# Patient Record
Sex: Female | Born: 1940 | Race: White | Hispanic: No | Smoking: Former smoker
Health system: Southern US, Community
[De-identification: ages and names within clinical notes are randomized; demographics above are authoritative.]

## PROBLEM LIST (undated history)

## (undated) DIAGNOSIS — I639 Cerebral infarction, unspecified: Secondary | ICD-10-CM

## (undated) DIAGNOSIS — J45909 Unspecified asthma, uncomplicated: Secondary | ICD-10-CM

## (undated) DIAGNOSIS — F32A Depression, unspecified: Secondary | ICD-10-CM

## (undated) DIAGNOSIS — E1142 Type 2 diabetes mellitus with diabetic polyneuropathy: Secondary | ICD-10-CM

## (undated) DIAGNOSIS — K219 Gastro-esophageal reflux disease without esophagitis: Secondary | ICD-10-CM

## (undated) DIAGNOSIS — Z972 Presence of dental prosthetic device (complete) (partial): Secondary | ICD-10-CM

## (undated) DIAGNOSIS — I10 Essential (primary) hypertension: Secondary | ICD-10-CM

## (undated) DIAGNOSIS — M1990 Unspecified osteoarthritis, unspecified site: Secondary | ICD-10-CM

## (undated) DIAGNOSIS — Z974 Presence of external hearing-aid: Secondary | ICD-10-CM

## (undated) DIAGNOSIS — E119 Type 2 diabetes mellitus without complications: Secondary | ICD-10-CM

## (undated) DIAGNOSIS — N184 Chronic kidney disease, stage 4 (severe): Secondary | ICD-10-CM

## (undated) DIAGNOSIS — D649 Anemia, unspecified: Secondary | ICD-10-CM

## (undated) HISTORY — PX: BREAST LUMPECTOMY: SHX2

## (undated) HISTORY — PX: COLONOSCOPY: SHX174

---

## 2017-11-27 ENCOUNTER — Encounter: Payer: Self-pay | Admitting: Neurology

## 2017-11-29 ENCOUNTER — Telehealth: Payer: Self-pay | Admitting: Neurology

## 2017-11-29 NOTE — Telephone Encounter (Signed)
Request for records faxed to [place] (Dr. Princella Ion) [phone] & to [place]  Request mailed to [facility] in [place] 11/29/17  LM

## 2017-12-05 ENCOUNTER — Ambulatory Visit: Payer: Medicare PPO | Attending: Physician Assistant

## 2017-12-05 DIAGNOSIS — R4789 Other speech disturbances: Secondary | ICD-10-CM | POA: Insufficient documentation

## 2017-12-05 DIAGNOSIS — R479 Unspecified speech disturbances: Secondary | ICD-10-CM

## 2017-12-05 NOTE — Therapy (Signed)
[facility] [address] [place], [place], [address] Phone: [phone]   Fax:  [phone]  Patient Details  Name: Phylis Javed MRN: [id number] Date of Birth: 1940/07/19 Referring Provider:  [name], PA-C  Encounter Date: 12/05/2017  ST ARRIVED-CANCEL  Pt/daughter arrived and SLP learned pt's MD had put in order for [facility] therapies.   Pt will pursue HH therapies first and then if necessary return at a later date for ST eval.  Pt will be rescheduled for 01-09-18 due to tight ST schedules until that date.  [facility], [place]  12/05/2017, 1:51 PM  [facility] [address] [place], [place], [address] Phone: [phone]   Fax:  [phone]

## 2017-12-06 ENCOUNTER — Encounter: Payer: Self-pay | Admitting: Neurology

## 2017-12-06 ENCOUNTER — Ambulatory Visit (INDEPENDENT_AMBULATORY_CARE_PROVIDER_SITE_OTHER): Payer: Medicare PPO | Admitting: Neurology

## 2017-12-06 VITALS — BP 104/64 | HR 78 | Ht 64.0 in | Wt 135.2 lb

## 2017-12-06 DIAGNOSIS — I639 Cerebral infarction, unspecified: Secondary | ICD-10-CM | POA: Diagnosis not present

## 2017-12-06 DIAGNOSIS — Z8673 Personal history of transient ischemic attack (TIA), and cerebral infarction without residual deficits: Secondary | ICD-10-CM | POA: Insufficient documentation

## 2017-12-06 DIAGNOSIS — E1142 Type 2 diabetes mellitus with diabetic polyneuropathy: Secondary | ICD-10-CM

## 2017-12-06 NOTE — Patient Instructions (Signed)
Continue your medications  Continue physical therapy   We will ask for your records again so I can review  Return to clinic in 3 months

## 2017-12-06 NOTE — Progress Notes (Signed)
[facility] Clinic Note - Initial Visit   Date: 12/06/17  Teresa Daniels MRN: [id number] DOB: 12/29/1940   Dear [name], PA-C:  Thank you for your kind referral of Teresa Daniels for consultation of stroke. Although her history is well known to you, please allow [place] to reiterate it for the purpose of our medical record. The patient was accompanied to the clinic by daughter who also provides collateral information.     History of Present Illness: Teresa Daniels is a 77 y.o. 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  Patient was living in [place] and recently moved to [place] to be closer to family as she recovers from her stroke.  Patient was in her usual state of health until easter weekend, when she suffered two falls on Saturday and had difficulty standing up due to right leg weakness.  She later told her daughter that it took an hour to stand up.  The following day, she cancelled plans for [employer] and going to church because she did not feel well and had another fall.  She was having difficulty texting her daughter, attributed to right arm "heaviness" and said they her right leg was not moving.  Immediately, her daughter instructed her to call EMS who transported her to her local hospital.  I do not have any of the records of her hospital stay.  From what I can gather, she presented outside of the window for tPA. Stroke etiology was suspected due to large vessel stenosis (location?) and she was started on plavix, as she was already taking aspirin.  Currently, she takes aspirin 81mg , plavix 75mg , and atorvastatin 40mg .  She was discharged to rehab facility where she was in a wheelchair and gradually made some improvement of her right arm and leg strength.  She moved to [facility] very quickly after being discharged from rehab and  has plans to return home, when able.  Over the past few weeks, she has regained all function of the right arm and has some weakness of the right leg, but it is much stronger than before.  She is walking with a walker now.  Texting is easier than before.  She does not have difficulty swallowing or numbness/tingling on one side.  She has long history of diabetic neuropathy of the feet and takes gabapentin 300mg  at bedtime.   Past Medical History Diabetes mellitus Hypertension Hyperlipidemia Stroke   Past Surgical History C-section in 1973   Medications:  Outpatient Encounter Medications as of 12/06/2017  Medication Sig  . Ascorbic Acid (VITAMIN C PO) Take by mouth.  Marland Kitchen aspirin 81 MG chewable tablet Chew by mouth.  Marland Kitchen atorvastatin (LIPITOR) 40 MG tablet Take by mouth.  . beclomethasone (QVAR) 40 MCG/ACT inhaler Inhale into the lungs.  . cholecalciferol (VITAMIN D) 1000 units tablet Take by mouth.  . clopidogrel (PLAVIX) 75 MG tablet Take by mouth.  . docusate sodium (COLACE) 100 MG capsule Take by mouth.  . ferrous sulfate 325 (65 FE) MG tablet Take by mouth.  . gabapentin (NEURONTIN) 300 MG capsule Take one capsule at bedtime.  . hydrochlorothiazide (HYDRODIURIL) 25 MG tablet Take by mouth.  . levocetirizine (XYZAL) 5 MG tablet Take by mouth.  . losartan (COZAAR) 50 MG tablet Take by mouth.  Marland Kitchen MAGNESIUM PO Take by mouth.  . metFORMIN (GLUCOPHAGE) 1000 MG tablet Take by mouth.  . montelukast (SINGULAIR) 10 MG tablet Take by  mouth.  . Omega-3 1000 MG CAPS Take by mouth.  . pantoprazole (PROTONIX) 40 MG tablet Take by mouth.  . traZODone (DESYREL) 50 MG tablet Take by mouth.   No facility-administered encounter medications on file as of 12/06/2017.      Allergies: Allergies not on file  Family History: Mother had heart disease and breast cancer. Father had TB complications.  Sister has heart disease, living.   Social History: Social History   Tobacco Use  . Smoking status:  Former Smoker    Last attempt to quit: 1969    Years since quitting: 50.4  . Smokeless tobacco: Never Used  Substance Use Topics  . Alcohol use: Yes  . Drug use: Never   Social History   Social History Narrative   Lives alone in [place].  Currently staying in [place] with her daughter until she is able to go home.  Has 2 children.  Retired Web designer.  Education: college.     Review of Systems:  CONSTITUTIONAL: No fevers, chills, night sweats, or weight loss.   EYES: No visual changes or eye pain ENT: No hearing changes.  No history of nose bleeds.   RESPIRATORY: No cough, wheezing and shortness of breath.   CARDIOVASCULAR: Negative for chest pain, and palpitations.   GI: Negative for abdominal discomfort, blood in stools or black stools.  No recent change in bowel habits.   GU:  No history of incontinence.   MUSCLOSKELETAL: No history of joint pain or swelling.  No myalgias.   SKIN: Negative for lesions, rash, and itching.   HEMATOLOGY/ONCOLOGY: Negative for prolonged bleeding, bruising easily, and swollen nodes.  No history of cancer.   ENDOCRINE: Negative for cold or heat intolerance, polydipsia or goiter.   PSYCH:  No depression or anxiety symptoms.   NEURO: As Above.   Vital Signs:  BP 104/64   Pulse 78   Wt 135 lb 4 oz (61.3 kg)   SpO2 93%    General Medical Exam:   General:  Well appearing, comfortable.   Eyes/ENT: see cranial nerve examination.   Neck: No masses appreciated.  Full range of motion without tenderness.  No carotid bruits. Respiratory:  Clear to auscultation, good air entry bilaterally.   Cardiac:  Regular rate and rhythm, no murmur.   Extremities:  No deformities, edema, or skin discoloration.  Skin:  No rashes or lesions.  Neurological Exam: MENTAL STATUS including orientation to time, place, person, recent and remote memory, attention span and concentration, language, and fund of knowledge is normal.  Speech is not dysarthric, she has  hoarseness.  CRANIAL NERVES: II:  No visual field defects.  Unremarkable fundi.   III-IV-VI: Pupils equal round and reactive to light.  Normal conjugate, extra-ocular eye movements in all directions of gaze.  No nystagmus.  No ptosis.   V:  Normal facial sensation.   VII:  Normal facial symmetry and movements.   VIII:  Normal hearing and vestibular function.   IX-X:  Normal palatal movement.   XI:  Normal shoulder shrug and head rotation.   XII:  Normal tongue strength and range of motion, no deviation or fasciculation.  MOTOR:  No atrophy, fasciculations or abnormal movements.  No pronator drift.  Tone is normal.    Right Upper Extremity:    Left Upper Extremity:    Deltoid  5/5   Deltoid  5/5   Biceps  5/5   Biceps  5/5   Triceps  5/5   Triceps  5/5  Wrist extensors  5/5   Wrist extensors  5/5   Wrist flexors  5/5   Wrist flexors  5/5   Finger extensors  5/5   Finger extensors  5/5   Finger flexors  5/5   Finger flexors  5/5   Dorsal interossei  5/5   Dorsal interossei  5/5   Abductor pollicis  5/5   Abductor pollicis  5/5   Tone ([place] scale)  0  Tone ([place] scale)  0   Right Lower Extremity:    Left Lower Extremity:    Hip flexors  4/5   Hip flexors  5/5   Hip extensors  5/5   Hip extensors  5/5   Knee flexors  5/5   Knee flexors  5/5   Knee extensors  5/5   Knee extensors  5/5   Dorsiflexors  5/5   Dorsiflexors  5/5   Plantarflexors  5/5   Plantarflexors  5/5   Toe extensors  4/5   Toe extensors  4/5   Toe flexors  4/5   Toe flexors  4/5   Tone ([place] scale)  0  Tone ([place] scale)  0   MSRs:  Right                                                                 Left brachioradialis 2+  brachioradialis 2+  biceps 2+  biceps 2+  triceps 2+  triceps 2+  patellar 1+  Patellar 1+  ankle jerk 0  ankle jerk 0  Hoffman no  Hoffman no  plantar response down  plantar response down   SENSORY: trace vibration at the knees bilaterally; gradient pattern of  temperature and pin pick distal to lower legs.  Sensation is intact in the arms.   COORDINATION/GAIT: Normal finger-to- nose-finger.  Intact rapid alternating movements bilaterally. Gait is mildly wide-based, assisted with walker or one-person.   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  Given her dense symptoms, suspect pure motor basal ganglia stroke vs LMCA motor territory.  Clinically, she has made marked improvement with now normal function in the RUE, but continues to have proximal RLE weakness.  Her hospital records have been requested again so I can review to understand etiology of her stroke. From what I understand, there was concern of large vessel stenosis and therefore started dual antiplatelet therapy and statin therapy.  At this time, recommend that she continue aspirin 81mg , plavix 75mg , and lipitor 40mg  daily.  She will continue physical therapy and I encouraged her to be compliant with her home exercises.  If there is any concern from her hospital records for cardioembolic source, I will order cardionet monitor.   Diabetic neuropathy with distal sensorimotor deficits.  She takes gabapentin 300mg  at bedtime.  Continue to use walker.  Fall precautions dicussed.  I would like to see her stronger prior to deciding that she is safe to return home.  She would like to resume driving which would be better assess with formal driving evaluation, which we can discuss at her next visit based on her progress.    Return to clinic in 3 months   Thank you for allowing me to participate in patient's care.  If I can answer any additional questions, I would  be pleased to do so.    Sincerely,    Fusae Florio K. Posey Pronto, DO

## 2017-12-07 ENCOUNTER — Telehealth: Payer: Self-pay | Admitting: Neurology

## 2017-12-07 NOTE — Telephone Encounter (Signed)
Rec'd medical records form [facility] - Sending interoffice mail to Dr. Posey Pronto At [facility] 12/07/17 LM

## 2017-12-07 NOTE — Telephone Encounter (Signed)
Rec'd records from [facility] have been sent to  Dr Posey Pronto at [facility] via interoffice mail 12/07/17  LM

## 2017-12-13 NOTE — Telephone Encounter (Signed)
I spoke with patient's daughter and she said that she is her mom's transportation and is currently in [place].  She will be back around June 19th so would like it scheduled that week if possible.

## 2017-12-13 NOTE — Telephone Encounter (Signed)
I received over 550 pages of outside hospital records, and spent 40 minutes reviewing records.  In summary, on 4/21, she presented to the ER with 2 day history of right sided weakness and falls. Exam showed weakness of the right face, arm, and leg. MRI showed left subcortical infarct. There was no evidence of large vessel occlusion. Surface echocardiogram did not show a cardioembolic source. She was previously taking aspirin and Zocor at home.  With her new stroke, she was started on dual antiplatelet therapy with aspirin 81 and Plavix 75 mg daily.  His she was discharged to [facility] where she was admitted from 4/30 through 11/22/2017.  Labs 06/11/2017: TSH 1.4, hemoglobin A1c 6.3, LDL 78, cholesterol 151 triglycerides 142 HDL 47, complete metabolic panel all within normal limits.  Diagnostic studies April 2019 TTE: EF is mildly reduced at 94-80%, grade 1 diastolic dysfunction. Right ventricle appears normal. Left atrium is mildly enlarged, right atrium is normal. Trivial mitral regurgitation and mild tricuspid regurgitation. CT angiogram head and neck: Mild intracranial atherosclerotic disease. Mild calcification and right carotid bulb, less than 50% severity. MRI brain: Acute nonhemorrhagic cerebrovascular accident along the left putamen and left corona radiate.  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 To be complete, I recommend 30 day cardiac monitoring assess for underlying cardiac arrhythmia.  Donika K. Posey Pronto, DO

## 2017-12-14 ENCOUNTER — Other Ambulatory Visit: Payer: Self-pay | Admitting: *Deleted

## 2017-12-14 DIAGNOSIS — I639 Cerebral infarction, unspecified: Secondary | ICD-10-CM

## 2017-12-14 DIAGNOSIS — R42 Dizziness and giddiness: Secondary | ICD-10-CM

## 2017-12-14 NOTE — Telephone Encounter (Signed)
I will call cardiology to set this up and let patient's daughter know appointment time and date.

## 2018-01-01 ENCOUNTER — Ambulatory Visit (INDEPENDENT_AMBULATORY_CARE_PROVIDER_SITE_OTHER): Payer: Medicare PPO

## 2018-01-01 ENCOUNTER — Other Ambulatory Visit: Payer: Self-pay | Admitting: Neurology

## 2018-01-01 DIAGNOSIS — I4891 Unspecified atrial fibrillation: Secondary | ICD-10-CM | POA: Diagnosis not present

## 2018-01-01 DIAGNOSIS — R42 Dizziness and giddiness: Secondary | ICD-10-CM

## 2018-01-01 DIAGNOSIS — I639 Cerebral infarction, unspecified: Secondary | ICD-10-CM | POA: Diagnosis not present

## 2018-01-09 ENCOUNTER — Ambulatory Visit: Payer: Medicare PPO

## 2018-02-11 ENCOUNTER — Ambulatory Visit: Payer: Self-pay | Admitting: Neurology

## 2018-02-14 ENCOUNTER — Telehealth: Payer: Self-pay | Admitting: *Deleted

## 2018-02-14 NOTE — Telephone Encounter (Signed)
-----   Message from [name], DO sent at 02/14/2018  2:10 PM EDT ----- Please inform patient that her heart monitor looks great, no abnormal rhythms.  Thanks.

## 2018-02-14 NOTE — Telephone Encounter (Signed)
Patient's daughter given the results.

## 2018-03-08 ENCOUNTER — Emergency Department (HOSPITAL_COMMUNITY)
Admission: EM | Admit: 2018-03-08 | Discharge: 2018-03-08 | Disposition: A | Discharge status: Home / Self Care | Payer: Medicare PPO | Attending: Emergency Medicine | Admitting: Emergency Medicine

## 2018-03-08 ENCOUNTER — Emergency Department (HOSPITAL_COMMUNITY): Payer: Medicare PPO

## 2018-03-08 ENCOUNTER — Other Ambulatory Visit: Payer: Self-pay

## 2018-03-08 ENCOUNTER — Encounter (HOSPITAL_COMMUNITY): Payer: Self-pay | Admitting: Emergency Medicine

## 2018-03-08 DIAGNOSIS — W108XXA Fall (on) (from) other stairs and steps, initial encounter: Secondary | ICD-10-CM | POA: Diagnosis not present

## 2018-03-08 DIAGNOSIS — S0990XA Unspecified injury of head, initial encounter: Secondary | ICD-10-CM | POA: Diagnosis not present

## 2018-03-08 DIAGNOSIS — M79671 Pain in right foot: Secondary | ICD-10-CM

## 2018-03-08 DIAGNOSIS — Z87891 Personal history of nicotine dependence: Secondary | ICD-10-CM | POA: Insufficient documentation

## 2018-03-08 DIAGNOSIS — E119 Type 2 diabetes mellitus without complications: Secondary | ICD-10-CM | POA: Diagnosis not present

## 2018-03-08 DIAGNOSIS — Y929 Unspecified place or not applicable: Secondary | ICD-10-CM | POA: Diagnosis not present

## 2018-03-08 DIAGNOSIS — Y9389 Activity, other specified: Secondary | ICD-10-CM | POA: Insufficient documentation

## 2018-03-08 DIAGNOSIS — E1142 Type 2 diabetes mellitus with diabetic polyneuropathy: Secondary | ICD-10-CM | POA: Insufficient documentation

## 2018-03-08 DIAGNOSIS — W19XXXA Unspecified fall, initial encounter: Secondary | ICD-10-CM

## 2018-03-08 DIAGNOSIS — Y999 Unspecified external cause status: Secondary | ICD-10-CM | POA: Diagnosis not present

## 2018-03-08 DIAGNOSIS — Z8673 Personal history of transient ischemic attack (TIA), and cerebral infarction without residual deficits: Secondary | ICD-10-CM | POA: Insufficient documentation

## 2018-03-08 DIAGNOSIS — I10 Essential (primary) hypertension: Secondary | ICD-10-CM | POA: Insufficient documentation

## 2018-03-08 DIAGNOSIS — S0101XA Laceration without foreign body of scalp, initial encounter: Secondary | ICD-10-CM | POA: Diagnosis present

## 2018-03-08 HISTORY — DX: Essential (primary) hypertension: I10

## 2018-03-08 HISTORY — DX: Unspecified asthma, uncomplicated: J45.909

## 2018-03-08 HISTORY — DX: Type 2 diabetes mellitus without complications: E11.9

## 2018-03-08 HISTORY — DX: Cerebral infarction, unspecified: I63.9

## 2018-03-08 MED ORDER — LIDOCAINE-EPINEPHRINE (PF) 2 %-1:200000 IJ SOLN
10.0000 mL | Freq: Once | INTRAMUSCULAR | Status: AC
  Administered 2018-03-08: 10 mL
  Filled 2018-03-08: qty 20

## 2018-03-08 NOTE — ED Provider Notes (Signed)
Patient placed in Quick Look pathway, seen and evaluated   Chief Complaint: fall  HPI:   Teresa Daniels is a 77 y.o. female who presents to the via private care s/p fall. was going up the stairs and made it up to 2 stairs and she fell backwards. Pt unsure why she fell, she denies dizziness. Pt did not lose consciousness. Pt did hit the back of her head on the concrete floor. Pt reports she is currently on blood thinners. Pt also reports pain to buttocks. Pt alert and oriented, in no acute distress at this time.   ROS: Neuro: headache  Skin: laceration  M/S: low back and right ankle pain  Physical Exam:  BP (!) 105/54 (BP Location: Left Arm)   Pulse 88   Temp 98.3 F (36.8 C) (Oral)   Resp 16   Ht 5\' 5"  (1.651 m)   Wt 59 kg   SpO2 98%   BMI 21.63 kg/m    Gen: No distress  Neuro: Awake and Alert  Skin: laceration scalp  M/S: right lateral ankle swollen, lower back and neck pain     Initiation of care has begun. The patient has been counseled on the process, plan, and necessity for staying for the completion/evaluation, and the remainder of the medical screening examination    Ashley Murrain, NP 03/08/18 Sylvan Cheese, MD 03/09/18 [phone]

## 2018-03-08 NOTE — Discharge Instructions (Signed)
You were seen in the Emergency Department (ED) today for a head injury.  Based on your evaluation, you may have sustained a concussion (or bruise) to your brain.  If you had a CT scan done, it did not show any evidence of serious injury or bleeding.    Symptoms to expect from a concussion include nausea, mild to moderate headache, difficulty concentrating or sleeping, and mild lightheadedness.  These symptoms should improve over the next few days to weeks, but it may take many weeks before you feel back to normal.  Return to the emergency department or follow-up with your primary care doctor if your symptoms are not improving over this time.  Signs of a more serious head injury include vomiting, severe headache, excessive sleepiness or confusion, and weakness or numbness in your face, arms or legs.  Return immediately to the Emergency Department if you experience any of these more concerning symptoms.    You will need to call the orthopedic surgeon on Tuesday, Dr. Griffin Basil, and schedule a follow up appointment in 2 weeks. Use the boot provided for comfort but if you are not having pain you can stop using the boot.   Rest, avoid strenuous physical or mental activity, and avoid activities that could potentially result in another head injury until all your symptoms from this head injury are completely resolved for at least 2-3 weeks.  IYou may take ibuprofen or acetaminophen over the counter according to label instructions for mild headache or scalp soreness.

## 2018-03-08 NOTE — ED Triage Notes (Signed)
Pt had fall. Pt was going up the stairs and made it up to 2 stairs and she fell backwards. Pt unsure why she fell, she denies dizziness. Pt did not lose consciousness. Pt did hit the back of her head on the concrete floor. Pt reports she is currently on blood thinners. Pt also reports pain to buttocks. Pt alert and oriented, in no acute distress at this time.

## 2018-03-08 NOTE — ED Provider Notes (Signed)
Emergency Department Provider Note   I have reviewed the triage vital signs and the nursing notes.   HISTORY  Chief Complaint Fall   HPI Teresa Daniels is a 77 y.o. female with PMH of prior CVA, DM, HTN, and Asthma resents to the emergency department for evaluation after mechanical fall.  The patient was walking up steps when she missed the second step with her right foot and fell backwards.  This was witnessed by her family member who is at bedside providing additional history.  Patient fell directly backwards striking the back of her head on the ground but denies any loss of consciousness.  There was bleeding which was controlled with direct pressure.  The patient is taking aspirin and Plavix daily.  She denies any chest pain, heart palpitations, shortness of breath, or lightheadedness prior to falling.  She does have some mild pain in the right ankle with some swelling but was able to ambulate on the leg without significant difficulty after the fall.    Past Medical History:  Diagnosis Date  . Asthma   . Diabetes mellitus without complication ([place])   . Hypertension   . Stroke [facility])     Patient Active Problem List   Diagnosis Date Noted  . Ischemic stroke ([place]) 12/06/2017  . Diabetic polyneuropathy associated with type 2 diabetes mellitus ([place]) 12/06/2017    Past Surgical History:  Procedure Laterality Date  . CESAREAN SECTION      Allergies Patient has no known allergies.  History reviewed. No pertinent family history.  Social History Social History   Tobacco Use  . Smoking status: Former Smoker    Last attempt to quit: 1969    Years since quitting: 50.6  . Smokeless tobacco: Never Used  Substance Use Topics  . Alcohol use: Not Currently  . Drug use: Never    Review of Systems  Constitutional: No fever/chills Eyes: No visual changes. ENT: No sore throat. Cardiovascular: Denies chest pain. Respiratory: Denies shortness of breath. Gastrointestinal: No  abdominal pain.  No nausea, no vomiting.  No diarrhea.  No constipation. Genitourinary: Negative for dysuria. Musculoskeletal: Negative for back pain. Positive right ankle pain.  Skin: Negative for rash. Neurological: Negative for focal weakness or numbness. Positive HA.   10-point ROS otherwise negative.  ____________________________________________   PHYSICAL EXAM:  VITAL SIGNS: ED Triage Vitals  Enc Vitals Group     BP 03/08/18 1749 (!) 105/54     Pulse Rate 03/08/18 1749 88     Resp 03/08/18 1749 16     Temp 03/08/18 1749 98.3 F (36.8 C)     Temp Source 03/08/18 1749 Oral     SpO2 03/08/18 1749 98 %     Weight 03/08/18 1808 130 lb (59 kg)     Height 03/08/18 1808 5\' 5"  (1.651 m)     Pain Score 03/08/18 1808 2   Constitutional: Alert and oriented. Well appearing and in no acute distress. Eyes: Conjunctivae are normal.  Head: Occipital hematoma with stellate laceration/abrasion over the crown of the head. Wound is hemostatic.  Nose: No congestion/rhinnorhea. Mouth/Throat: Mucous membranes are moist.  Neck: No stridor. No cervical spine tenderness to palpation. Cardiovascular: Normal rate, regular rhythm. Good peripheral circulation. Grossly normal heart sounds.   Respiratory: Normal respiratory effort.  No retractions. Lungs CTAB. Gastrointestinal: Soft and nontender. No distention.  Musculoskeletal: Mild right ankle tenderness. No bruising or laceration.  Neurologic:  Normal speech and language. No gross focal neurologic deficits are appreciated.  Skin:  Skin is warm, dry and intact. No rash noted.  ____________________________________________  RADIOLOGY  Dg Lumbar Spine Complete  Result Date: 03/08/2018 CLINICAL DATA:  Pt had fall. Pt was going up the stairs and made it up to 2 stairs and she fell backwards. Pt unsure why she fell, she denies dizziness. Pt did not lose consciousness. Pt did hit the back of her head on the concrete floor. Pain in the buttocks. EXAM:  LUMBAR SPINE - COMPLETE 4+ VIEW COMPARISON:  None. FINDINGS: l there is 7 millimeters anterolisthesis of L4 on L5, likely degenerative. Facet hypertrophy noted in the LOWER lumbar levels. There is no acute fracture or traumatic subluxation. Sacral detail is limited by presence of contrast material throughout the colon. There is dense atherosclerotic calcification of the abdominal aorta. IMPRESSION: 1. No acute fracture. 2. Grade 1 anterolisthesis of L4 on L5, likely degenerative. 3.  Aortic atherosclerosis.  (ICD10-I70.0) Electronically Signed   By: Nolon Nations M.D.   On: 03/08/2018 19:29   Dg Ankle Complete Right  Result Date: 03/08/2018 CLINICAL DATA:  Pt had fall. Pt was going up the stairs and made it up to 2 stairs and she fell backwards. Pt unsure why she fell, she denies dizziness. Pt did not lose consciousness. Pt did hit the back of her head on the concrete floor. EXAM: RIGHT ANKLE - COMPLETE 3+ VIEW COMPARISON:  None. FINDINGS: There is diffuse soft tissue swelling about the ankle. There is a small bone density adjacent to the inferior aspect of the cuboid, raising the question of small avulsion injury. The distal tibia and fibula are intact. IMPRESSION: Possible cuboid injury. Diffuse soft tissue swelling. Electronically Signed   By: Nolon Nations M.D.   On: 03/08/2018 19:31   Ct Head Wo Contrast  Result Date: 03/08/2018 CLINICAL DATA:  Fall while climbing stairs. EXAM: CT HEAD WITHOUT CONTRAST CT CERVICAL SPINE WITHOUT CONTRAST TECHNIQUE: Multidetector CT imaging of the head and cervical spine was performed following the standard protocol without intravenous contrast. Multiplanar CT image reconstructions of the cervical spine were also generated. COMPARISON:  None. FINDINGS: CT HEAD FINDINGS Brain: There is no mass, hemorrhage or extra-axial collection. The size and configuration of the ventricles and extra-axial CSF spaces are normal. There are old bilateral centrum semiovale lacunar  infarcts. There is hypoattenuation of the periventricular white matter, most commonly indicating chronic ischemic microangiopathy. Vascular: No abnormal hyperdensity of the major intracranial arteries or dural venous sinuses. No intracranial atherosclerosis. Skull: Large right parietal scalp hematoma without skull fracture. Sinuses/Orbits: No fluid levels or advanced mucosal thickening of the visualized paranasal sinuses. No mastoid or middle ear effusion. The orbits are normal. CT CERVICAL SPINE FINDINGS Alignment: No static subluxation. Facets are aligned. Occipital condyles are normally positioned. Skull base and vertebrae: No acute fracture. Soft tissues and spinal canal: No prevertebral fluid or swelling. No visible canal hematoma. Disc levels: There is multilevel degenerative disc disease with mild vertebral body height loss and anterior osteophyte formation. There is multilevel facet hypertrophy, which is worst at right C3-C4 and left C2-C6. The left C2 and C3 facets are fused. There is no high-grade foraminal stenosis. Upper chest: No pneumothorax, pulmonary nodule or pleural effusion. Other: Normal visualized paraspinal cervical soft tissues. IMPRESSION: 1. No acute intracranial abnormality. 2. No fracture or static subluxation of the cervical spine. 3. Chronic ischemic microangiopathy. 4. Multilevel facet arthrosis, left-greater-than-right. Electronically Signed   By: Ulyses Jarred M.D.   On: 03/08/2018 19:23   Ct Cervical Spine Wo Contrast  Result Date: 03/08/2018 CLINICAL DATA:  Fall while climbing stairs. EXAM: CT HEAD WITHOUT CONTRAST CT CERVICAL SPINE WITHOUT CONTRAST TECHNIQUE: Multidetector CT imaging of the head and cervical spine was performed following the standard protocol without intravenous contrast. Multiplanar CT image reconstructions of the cervical spine were also generated. COMPARISON:  None. FINDINGS: CT HEAD FINDINGS Brain: There is no mass, hemorrhage or extra-axial collection. The  size and configuration of the ventricles and extra-axial CSF spaces are normal. There are old bilateral centrum semiovale lacunar infarcts. There is hypoattenuation of the periventricular white matter, most commonly indicating chronic ischemic microangiopathy. Vascular: No abnormal hyperdensity of the major intracranial arteries or dural venous sinuses. No intracranial atherosclerosis. Skull: Large right parietal scalp hematoma without skull fracture. Sinuses/Orbits: No fluid levels or advanced mucosal thickening of the visualized paranasal sinuses. No mastoid or middle ear effusion. The orbits are normal. CT CERVICAL SPINE FINDINGS Alignment: No static subluxation. Facets are aligned. Occipital condyles are normally positioned. Skull base and vertebrae: No acute fracture. Soft tissues and spinal canal: No prevertebral fluid or swelling. No visible canal hematoma. Disc levels: There is multilevel degenerative disc disease with mild vertebral body height loss and anterior osteophyte formation. There is multilevel facet hypertrophy, which is worst at right C3-C4 and left C2-C6. The left C2 and C3 facets are fused. There is no high-grade foraminal stenosis. Upper chest: No pneumothorax, pulmonary nodule or pleural effusion. Other: Normal visualized paraspinal cervical soft tissues. IMPRESSION: 1. No acute intracranial abnormality. 2. No fracture or static subluxation of the cervical spine. 3. Chronic ischemic microangiopathy. 4. Multilevel facet arthrosis, left-greater-than-right. Electronically Signed   By: Ulyses Jarred M.D.   On: 03/08/2018 19:23    ____________________________________________   PROCEDURES  Procedure(s) performed:   Marland KitchenMarland KitchenLaceration Repair Date/Time: 03/08/2018 9:34 PM Performed by: Margette Fast, MD Authorized by: Margette Fast, MD   Consent:    Consent obtained:  Verbal   Consent given by:  Patient   Risks discussed:  Infection, need for additional repair, nerve damage, pain, poor  cosmetic result, poor wound healing, retained foreign body and vascular damage Anesthesia (see MAR for exact dosages):    Anesthesia method:  Local infiltration   Local anesthetic:  Lidocaine 2% WITH epi Laceration details:    Location:  Scalp   Scalp location:  Crown   Length (cm):  5 Repair type:    Repair type:  Simple Pre-procedure details:    Preparation:  Patient was prepped and draped in usual sterile fashion Exploration:    Hemostasis achieved with:  Direct pressure   Wound exploration: entire depth of wound probed and visualized     Wound extent: no foreign bodies/material noted and no underlying fracture noted     Contaminated: no   Treatment:    Area cleansed with:  Betadine and saline   Amount of cleaning:  Standard   Visualized foreign bodies/material removed: no   Skin repair:    Repair method:  Staples   Number of staples:  4 Approximation:    Approximation:  Loose Post-procedure details:    Dressing:  Bulky dressing   Patient tolerance of procedure:  Tolerated well, no immediate complications     ____________________________________________   INITIAL IMPRESSION / ASSESSMENT AND PLAN / ED COURSE  Pertinent labs & imaging results that were available during my care of the patient were reviewed by me and considered in my medical decision making (see chart for details).  Patient presents to the emergency department for evaluation of  pain in various areas after a fall.  She does have an occipital hematoma with likely laceration underneath.  Plan to clean the area and reassess.  CT imaging of the head and cervical spine reviewed without acute findings.  Plain film of the right ankle shows possible cuboid injury/avulsion.  Patient with normal range of motion of the right ankle and apparently is walking on the foot on scene without significant difficulty.   Spoke with Dr. Griffin Basil regarding the possible cuboid injury. Advises boot as needed for comfort and f/u in 2  weeks. WBAT. Repaired the scalp laceration as above and provide dressing material for home use. Wound is more of a deep abrasion but some wound edge separation so closed with staples.   At this time, I do not feel there is any life-threatening condition present. I have reviewed and discussed all results (EKG, imaging, lab, urine as appropriate), exam findings with patient. I have reviewed nursing notes and appropriate previous records.  I feel the patient is safe to be discharged home without further emergent workup. Discussed usual and customary return precautions. Patient and family (if present) verbalize understanding and are comfortable with this plan.  Patient will follow-up with their primary care provider. If they do not have a primary care provider, information for follow-up has been provided to them. All questions have been answered.  ____________________________________________  FINAL CLINICAL IMPRESSION(S) / ED DIAGNOSES  Final diagnoses:  Injury of head, initial encounter  Laceration of scalp, initial encounter  Right foot pain  Fall, initial encounter    MEDICATIONS GIVEN DURING THIS VISIT:  Medications  lidocaine-EPINEPHrine (XYLOCAINE W/EPI) 2 %-1:200000 (PF) injection 10 mL (10 mLs Infiltration Given 03/08/18 2115)    Note:  This document was prepared using Dragon voice recognition software and may include unintentional dictation errors.  Nanda Quinton, MD Emergency Medicine    Long, Wonda Old[facility], MD 03/08/18 2252

## 2018-03-08 NOTE — ED Notes (Signed)
Ortho tech paged  

## 2018-04-05 ENCOUNTER — Other Ambulatory Visit (INDEPENDENT_AMBULATORY_CARE_PROVIDER_SITE_OTHER): Payer: Medicare PPO

## 2018-04-05 ENCOUNTER — Ambulatory Visit (INDEPENDENT_AMBULATORY_CARE_PROVIDER_SITE_OTHER): Payer: Medicare PPO | Admitting: Neurology

## 2018-04-05 ENCOUNTER — Encounter: Payer: Self-pay | Admitting: Neurology

## 2018-04-05 ENCOUNTER — Telehealth: Payer: Self-pay | Admitting: *Deleted

## 2018-04-05 VITALS — BP 110/60 | HR 88 | Ht 64.0 in | Wt 124.1 lb

## 2018-04-05 DIAGNOSIS — I639 Cerebral infarction, unspecified: Secondary | ICD-10-CM | POA: Diagnosis not present

## 2018-04-05 DIAGNOSIS — R29898 Other symptoms and signs involving the musculoskeletal system: Secondary | ICD-10-CM

## 2018-04-05 DIAGNOSIS — R296 Repeated falls: Secondary | ICD-10-CM | POA: Diagnosis not present

## 2018-04-05 DIAGNOSIS — R2681 Unsteadiness on feet: Secondary | ICD-10-CM | POA: Diagnosis not present

## 2018-04-05 DIAGNOSIS — E1142 Type 2 diabetes mellitus with diabetic polyneuropathy: Secondary | ICD-10-CM | POA: Diagnosis not present

## 2018-04-05 LAB — FOLATE: Folate: 14.9 ng/mL (ref >5.9)

## 2018-04-05 LAB — VITAMIN B12: VITAMIN B 12: 239 pg/mL (ref 211–911)

## 2018-04-05 LAB — SEDIMENTATION RATE: Sed Rate: 21 mm/hr (ref 0–30)

## 2018-04-05 NOTE — Progress Notes (Signed)
Follow-up Visit   Date: 04/05/18    Teresa Daniels MRN: [id number] DOB: [birth year]   Interim History: Teresa Daniels is a 77 y.o. 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  The patient was accompanied to the clinic by daughter and grand daughter who also provides collateral information.    History of present illness: Patient was in her usual state of health until easter weekend, when she suffered two falls on Saturday and had difficulty standing up due to right leg weakness.  She later told her daughter that it took an hour to stand up.  The following day, she cancelled plans for [employer] and going to church because she did not feel well and had another fall.  She was having difficulty texting her daughter, attributed to right arm "heaviness" and said they her right leg was not moving.  Immediately, her daughter instructed her to call EMS who transported her to her local hospital.   On 4/21, she presented to the ER with 2 day history of right sided weakness and falls. Exam showed weakness of the right face, arm, and leg. MRI showed left subcortical infarct. There was no evidence of large vessel occlusion. Surface echocardiogram did not show a cardioembolic source. She was previously taking aspirin and Zocor at home.  With her new stroke, she was started on dual antiplatelet therapy with aspirin 81 and Plavix 75 mg daily.  His she was discharged to [facility] where she was admitted from 4/30 through 11/22/2017.  She was discharged to rehab facility where she was in a wheelchair and gradually made some improvement of her right arm and leg strength.  She moved to [facility] very quickly after being discharged from rehab and has plans to return home, when able.  Over the past few weeks, she has  regained all function of the right arm and has some weakness of the right leg, but it is much stronger than before.  She is walking with a walker now.  Texting is easier than before.  She does not have difficulty swallowing or numbness/tingling on one side.  She has long history of diabetic neuropathy of the feet and takes gabapentin 365m at bedtime.  UPDATE 04/05/2018:  Start in August, she began having imbalance and falling frequently, usually backwards, one of which she went to the ER due to scalp laceration.  She started having increased weakness in the legs, which can occur randomly.  Spells always occur when walking.  Over the past month, she has fallen 9 times.  She cannot get up from the floor and always needs assistance to stand, on one particular occasion, she laid on the floor for several hours before someone arrived home..  There is no numbness/tingling or pain.  [facility]  She has mild low back pain and occasional cramps.   Medications:  Current Outpatient Medications on File Prior to Visit  Medication Sig Dispense Refill  . Ascorbic Acid (VITAMIN C PO) Take by mouth.    .Marland Kitchenaspirin 81 MG chewable tablet Chew by mouth.    .Marland Kitchenatorvastatin (LIPITOR) 40 MG tablet Take by mouth.    . beclomethasone (QVAR) 40 MCG/ACT inhaler Inhale into the lungs.    . cholecalciferol (VITAMIN D) 1000 units tablet Take by mouth.    . clopidogrel (PLAVIX) 75 MG  tablet Take by mouth.    . ferrous sulfate 325 (65 FE) MG tablet Take by mouth.    . gabapentin (NEURONTIN) 300 MG capsule Take one capsule at bedtime.    . hydrochlorothiazide (HYDRODIURIL) 25 MG tablet Take by mouth.    . levocetirizine (XYZAL) 5 MG tablet Take by mouth.    . losartan (COZAAR) 50 MG tablet Take by mouth.    Marland Kitchen MAGNESIUM PO Take by mouth.    . metFORMIN (GLUCOPHAGE) 1000 MG tablet Take by mouth.    . montelukast (SINGULAIR) 10 MG tablet Take by mouth.    . Omega-3 1000 MG CAPS Take  by mouth.    . pantoprazole (PROTONIX) 40 MG tablet Take by mouth.    . sertraline (ZOLOFT) 100 MG tablet Take by mouth.    . traZODone (DESYREL) 50 MG tablet Take by mouth.     No current facility-administered medications on file prior to visit.     Allergies: No Known Allergies  Review of Systems:  CONSTITUTIONAL: No fevers, chills, night sweats, or weight loss.  EYES: No visual changes or eye pain ENT: No hearing changes.  No history of nose bleeds.   RESPIRATORY: No cough, wheezing and shortness of breath.   CARDIOVASCULAR: Negative for chest pain, and palpitations.   GI: Negative for abdominal discomfort, blood in stools or black stools.  No recent change in bowel habits.   GU:  No history of incontinence.   MUSCLOSKELETAL: No history of joint pain or swelling.  No myalgias.   SKIN: Negative for lesions, rash, and itching.   ENDOCRINE: Negative for cold or heat intolerance, polydipsia or goiter.   PSYCH:  + depression or anxiety symptoms.   NEURO: As Above.   Vital Signs:  BP 110/60   Pulse 88   Ht _0  (1.626 m)   Wt 124 lb 2 oz (56.3 kg)   SpO2 98%   BMI 21.31 kg/m   General Medical Exam:   General:  Well appearing, comfortable  Eyes/ENT: see cranial nerve examination.   Neck: No masses appreciated.  Full range of motion without tenderness.  No carotid bruits. Respiratory:  Clear to auscultation, good air entry bilaterally.   Cardiac:  Regular rate and rhythm, no murmur.   Ext: Bilateral pitting ankle edema  Neurological Exam: MENTAL STATUS including orientation to time, place, person, recent and remote memory, attention span and concentration, language, and fund of knowledge is normal.  Speech is not slightly hoarse, not dysarthric.  CRANIAL NERVES: No visual field defects. Pupils equal round and reactive to light.  Normal conjugate, extra-ocular eye movements in all directions of gaze.  No ptosis.  Face is symmetric. Palate elevates symmetrically.  Tongue is  midline.   MOTOR:  No atrophy, fasciculations or abnormal movements.  No pronator drift.  Tone is normal.    Right Upper Extremity:    Left Upper Extremity:    Deltoid  5/5   Deltoid  5/5   Biceps  5/5   Biceps  5/5   Triceps  5/5   Triceps  5/5   Wrist extensors  5/5   Wrist extensors  5/5   Wrist flexors  5/5   Wrist flexors  5/5   Finger extensors  5/5   Finger extensors  5/5   Finger flexors  5/5   Finger flexors  5/5   Dorsal interossei  5-/5   Dorsal interossei  5-/5   Abductor pollicis  5/5   Abductor pollicis  5/5   Tone (Ashworth scale)  0  Tone (Ashworth scale)  0   Right Lower Extremity:    Left Lower Extremity:    Hip flexors  4/5   Hip flexors  5/5   Hip extensors  5/5   Hip extensors  5/5   Knee flexors  5/5   Knee flexors  5/5   Knee extensors  5/5   Knee extensors  5/5   Dorsiflexors  5/5   Dorsiflexors  5/5   Plantarflexors  5/5   Plantarflexors  5/5   Toe extensors  4/5   Toe extensors  4/5   Toe flexors  4/5   Toe flexors  4/5   Tone (Ashworth scale)  0  Tone (Ashworth scale)  0   MSRs:  Reflexes are 2+/4 in the upper extremities, 1+ bilateral patella, and absent at the ankles.  SENSORY: Vibration is trace at the knees bilaterally, there is a gradient pattern of temperature and pinprick in the lower legs.  Sensation is intact in the arms.Marland Kitchen  COORDINATION/GAIT:  Normal finger-to- nose-finger and heel-to-shin.  Intact rapid alternating movements bilaterally.  Gait is wide-based, stooped, and appears unsteady, she is slightly more stable using a walker.     Data: Labs 5/202019:  HbA1c 6.4 Labs 03/18/2018:  1.130  Labs 06/11/2017: TSH 1.4, hemoglobin A1c 6.3, LDL 78, cholesterol 151 triglycerides 142 HDL 47, complete metabolic panel all within normal limits.  Diagnostic studies April 2019 TTE: EF is mildly reduced at 38-75%, grade 1 diastolic dysfunction. Right ventricle appears normal. Left atrium is mildly enlarged, right atrium is normal. Trivial mitral  regurgitation and mild tricuspid regurgitation. CT angiogram head and neck: Mild intracranial atherosclerotic disease. Mild calcification and right carotid bulb, less than 50% severity. MRI brain: Acute nonhemorrhagic cerebrovascular accident along the left putamen and left corona radiate.  30-day cardiac monitor - normal  IMPRESSION/PLAN: 1.  Falls, unclear etiology. Possibly related to right leg weakness which is residual from her stroke.   She reports that both legs buckle and is worse walking. Check MRI lumbar spine to evaluate for compressive lesion.  Start physical therapy.  Fall precautions discussed, always use walker.    2.  Peripheral neuropathy, diabetic.  Marked sensory ataxia and distal sensory loss. Check ESR, CRP, folate, vitamin B12, copper, vitamin E  3.  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  She has completed dual antiplatelet therapy for 3+ months, so will transition to monotherapy with plavix 74m daily.  Continue lipitor 471mdaily.  I received over 550 pages of OSH records via fax which are summarized on telephone encounter 5/31 and took 40 minutes to review.    Return to clinic in 4 months   Thank you for allowing me to participate in patient's care.  If I can answer any additional questions, I would be pleased to do so.    Sincerely,    Zeola Brys K. PaPosey ProntoDO

## 2018-04-05 NOTE — Telephone Encounter (Signed)
Attempted to contact patient's daughter but no answer and mailbox is full.

## 2018-04-05 NOTE — Patient Instructions (Addendum)
1.  Check labs  2.  MRI lumbar spine  3.  Start physical therapy  4.  Always use your walker  Return to clinic in 4 months

## 2018-04-05 NOTE — Telephone Encounter (Signed)
-----   Message from [name], DO sent at 04/05/2018  3:27 PM EDT ----- Please call patient/daughter and let them know that I would like to stop aspirin and keep her on plavix for stroke prevention.  You will need to call [facility] and let them know, too. Thanks.

## 2018-04-08 ENCOUNTER — Telehealth: Payer: Self-pay | Admitting: *Deleted

## 2018-04-08 NOTE — Telephone Encounter (Signed)
Gave patient's daughter results.

## 2018-04-08 NOTE — Telephone Encounter (Signed)
-----   Message from Cameron Sprang, MD sent at 04/08/2018 10:43 AM EDT ----- Pls let her know copper level normal, only awaiting vitamin E level, will update once available. Thanks

## 2018-04-09 ENCOUNTER — Telehealth: Payer: Self-pay | Admitting: *Deleted

## 2018-04-09 ENCOUNTER — Other Ambulatory Visit: Payer: Self-pay | Admitting: *Deleted

## 2018-04-09 LAB — VITAMIN E
Gamma-Tocopherol (Vit E): <1 mg/L (ref <=4.3)
VITAMIN E (ALPHA TOCOPHEROL): 6.6 mg/L (ref 5.7–19.9)

## 2018-04-09 LAB — TIQ-NTM

## 2018-04-09 LAB — COPPER, SERUM: COPPER: 95 ug/dL (ref 70–175)

## 2018-04-09 MED ORDER — CYANOCOBALAMIN 1000 MCG/ML IJ SOLN: INTRAMUSCULAR | 0 refills | Status: DC

## 2018-04-09 NOTE — Telephone Encounter (Signed)
I called [facility] and patient does not reside there.  I will call her daughter back to find out where she is.

## 2018-04-09 NOTE — Telephone Encounter (Signed)
-----   Message from [name], DO sent at 04/05/2018  4:57 PM EDT ----- Please inform patient that her vitamin B12 is boderline low and with her weakness and imbalance, I recommend starting Vitamin B12 1022mcg IM injection daily x 7 days, weekly x 4 weeks, then monthly thereafter x 1 year.  She should be able to get this at [facility]. Thanks.

## 2018-04-09 NOTE — Telephone Encounter (Signed)
I spoke with patient's daughter and she said that patient is residing at [facility].  I called [facility] and they finally located patient.  Faxed the order for B12 injections to [phone].  Also d/c'd the ASA.  Arville Go will have nurse to administer the injections.

## 2018-04-09 NOTE — Telephone Encounter (Signed)
-----   Message from [name], DO sent at 04/05/2018  4:57 PM EDT ----- Please inform patient that her vitamin B12 is boderline low and with her weakness and imbalance, I recommend starting Vitamin B12 1013mcg IM injection daily x 7 days, weekly x 4 weeks, then monthly thereafter x 1 year.  She should be able to get this at [facility]. Thanks.

## 2018-04-17 ENCOUNTER — Ambulatory Visit
Admission: RE | Admit: 2018-04-17 | Discharge: 2018-04-17 | Disposition: A | Discharge status: Home / Self Care | Payer: Medicare PPO | Source: Ambulatory Visit | Attending: Neurology | Admitting: Neurology

## 2018-04-17 DIAGNOSIS — I639 Cerebral infarction, unspecified: Secondary | ICD-10-CM

## 2018-04-17 DIAGNOSIS — R2681 Unsteadiness on feet: Secondary | ICD-10-CM

## 2018-04-23 ENCOUNTER — Telehealth: Payer: Self-pay | Admitting: *Deleted

## 2018-04-23 NOTE — Telephone Encounter (Signed)
I spoke with patient's daughter and gave her the results.  She will talk to her mom about the next MRI and let me know if she would like to do it.  She will also call me when patient gets home from rehab so that we can start the B12 injections.

## 2018-04-23 NOTE — Telephone Encounter (Signed)
-----   Message from [name], DO sent at 04/18/2018 11:47 AM EDT ----- Please inform patient that MRI shows arthritic changes of the spine, which is expected with age.  No areas of nerve impingement which would explain her falls, suggesting that her right leg weakness from stroke is most likely contributing to her imbalance and falls. Imaging also shows some mild changes in the sacrum and she may have a small fracture, the only way to look at this better is with MRI pelvis wo contrast - please order, if she would like to proceed. Unfortunately, there is management of fractures in this region is supportive only. Please encourage her to use a walker at all times.

## 2018-08-10 NOTE — Progress Notes (Signed)
Follow-up Visit   Date: 08/12/18    Teresa Daniels MRN: [id number] DOB: September 19, 1940   Interim History: Teresa Daniels is a 78 y.o. 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  The patient was accompanied to the clinic by daughter and grand daughter who also provides collateral information.    History of present illness: Patient was in her usual state of health until easter weekend, when she suffered two falls on Saturday and had difficulty standing up due to right leg weakness.  She later told her daughter that it took an hour to stand up.  The following day, she cancelled plans for [employer] and going to church because she did not feel well and had another fall.  She was having difficulty texting her daughter, attributed to right arm "heaviness" and said they her right leg was not moving.  Immediately, her daughter instructed her to call EMS who transported her to her local hospital.   On 4/21, she presented to the ER with 2 day history of right sided weakness and falls. Exam showed weakness of the right face, arm, and leg. MRI showed left subcortical infarct. There was no evidence of large vessel occlusion. Surface echocardiogram did not show a cardioembolic source. She was previously taking aspirin and Zocor at home.  With her new stroke, she was started on dual antiplatelet therapy with aspirin 81 and Plavix 75 mg daily.  His she was discharged to [facility] where she was admitted from 4/30 through 11/22/2017.  She was discharged to rehab facility where she was in a wheelchair and gradually made some improvement of her right arm and leg strength.  She moved to [facility] very quickly after being discharged from rehab and has plans to return home, when able.  Over the past few weeks, she has  regained all function of the right arm and has some weakness of the right leg, but it is much stronger than before.  She is walking with a walker now.  Texting is easier than before.  She does not have difficulty swallowing or numbness/tingling on one side.  She has long history of diabetic neuropathy of the feet and takes gabapentin 353m at bedtime.  UPDATE 04/05/2018:  Start in August, she began having imbalance and falling frequently, usually backwards, one of which she went to the ER due to scalp laceration.  She started having increased weakness in the legs, which can occur randomly.  Spells always occur when walking.  Over the past month, she has fallen 9 times.  She cannot get up from the floor and always needs assistance to stand, on one particular occasion, she laid on the floor for several hours before someone arrived home..  There is no numbness/tingling or pain.  [facility]  She has mild low back pain and occasional cramps.   UPDATE 08/12/2018:  She started physical therapy for gait and has noticed marked improvement in strength and balance.  She has not suffered any falls and is compliant with using a walker.  She admits to having increased imbalance in the shower, so uses a shower chair. She also puts shirts when sitting to avoid falling. She was also found to have vitamin B12 deficiency and gets injections at home.  In November, she moved into an independent living facility which she really  enjoys and has helped her mood.  No new complaints today.   Medications:  Current Outpatient Medications on File Prior to Visit  Medication Sig Dispense Refill  . Ascorbic Acid (VITAMIN C PO) Take by mouth.    Marland Kitchen atorvastatin (LIPITOR) 40 MG tablet Take by mouth.    . beclomethasone (QVAR) 40 MCG/ACT inhaler Inhale into the lungs.    . cholecalciferol (VITAMIN D) 1000 units tablet Take by mouth.    . clopidogrel (PLAVIX) 75 MG tablet Take by mouth.    .  cyanocobalamin (,VITAMIN B-12,) 1000 MCG/ML injection 1 ml IM daily x 7 days then 1 ml IM weekly x 4 weeks then 1 ml IM monthly x 1 year. 30 mL 0  . ferrous sulfate 325 (65 FE) MG tablet Take by mouth.    . gabapentin (NEURONTIN) 300 MG capsule Take one capsule at bedtime.    . hydrochlorothiazide (HYDRODIURIL) 25 MG tablet Take by mouth.    . levocetirizine (XYZAL) 5 MG tablet Take by mouth.    . losartan (COZAAR) 50 MG tablet Take by mouth.    Marland Kitchen MAGNESIUM PO Take by mouth.    . metFORMIN (GLUCOPHAGE) 1000 MG tablet Take by mouth.    . mirtazapine (REMERON) 15 MG tablet TAKE 1 TABLET BY MOUTH EVERY NIGHT AT BEDTIME    . montelukast (SINGULAIR) 10 MG tablet Take by mouth.    . Omega-3 1000 MG CAPS Take by mouth.    . omega-3 acid ethyl esters (LOVAZA) 1 g capsule Take by mouth.    . pantoprazole (PROTONIX) 40 MG tablet Take by mouth.    . sertraline (ZOLOFT) 100 MG tablet Take by mouth.     No current facility-administered medications on file prior to visit.     Allergies: No Known Allergies  Review of Systems:  CONSTITUTIONAL: No fevers, chills, night sweats, or weight loss.  EYES: No visual changes or eye pain ENT: No hearing changes.  No history of nose bleeds.   RESPIRATORY: No cough, wheezing and shortness of breath.   CARDIOVASCULAR: Negative for chest pain, and palpitations.   GI: Negative for abdominal discomfort, blood in stools or black stools.  No recent change in bowel habits.   GU:  No history of incontinence.   MUSCLOSKELETAL: No history of joint pain or swelling.  No myalgias.   SKIN: Negative for lesions, rash, and itching.   ENDOCRINE: Negative for cold or heat intolerance, polydipsia or goiter.   PSYCH:  No depression or anxiety symptoms.   NEURO: As Above.   Vital Signs:  BP 140/70   Pulse 77   Ht _0  (1.626 m)   Wt 136 lb (61.7 kg)   SpO2 97%   BMI 23.34 kg/m   General Medical Exam:   General:  Well appearing, comfortable  Eyes/ENT: see cranial  nerve examination.   Neck: No masses appreciated.  Full range of motion without tenderness.  No carotid bruits. Respiratory:  Clear to auscultation, good air entry bilaterally.   Cardiac:  Regular rate and rhythm, no murmur.   Ext:  No deformity  Neurological Exam: MENTAL STATUS including orientation to time, place, person, recent and remote memory, attention span and concentration, language, and fund of knowledge is normal.  Speech is not slightly hoarse, not dysarthric.  CRANIAL NERVES:   Pupils equal round and reactive to light.  Normal conjugate, extra-ocular eye movements in all directions of gaze.  No ptosis.  Face is symmetric. Palate elevates symmetrically.  Tongue  is midline.  MOTOR:  No atrophy, fasciculations or abnormal movements.  No pronator drift.  Tone is normal.    Right Upper Extremity:    Left Upper Extremity:    Deltoid  5/5   Deltoid  5/5   Biceps  5/5   Biceps  5/5   Triceps  5/5   Triceps  5/5   Wrist extensors  5/5   Wrist extensors  5/5   Wrist flexors  5/5   Wrist flexors  5/5   Finger extensors  5/5   Finger extensors  5/5   Finger flexors  5/5   Finger flexors  5/5   Dorsal interossei  5-/5   Dorsal interossei  5-/5   Abductor pollicis  5/5   Abductor pollicis  5/5   Tone (Ashworth scale)  0  Tone (Ashworth scale)  0   Right Lower Extremity:    Left Lower Extremity:    Hip flexors  5-/5   Hip flexors  5/5   Hip extensors  5/5   Hip extensors  5/5   Knee flexors  5/5   Knee flexors  5/5   Knee extensors  5/5   Knee extensors  5/5   Dorsiflexors  5/5   Dorsiflexors  5/5   Plantarflexors  5/5   Plantarflexors  5/5   Toe extensors  4/5   Toe extensors  4/5   Toe flexors  4/5   Toe flexors  4/5   Tone (Ashworth scale)  0  Tone (Ashworth scale)  0   MSRs:  Reflexes are 2+/4 in the upper extremities, 1+ bilateral patella, and absent at the ankles.  SENSORY: Vibration is absent distal to knees bilaterally.   Sensation is intact in the arms. Rhomberg test is  positive.   COORDINATION/GAIT:  Normal finger-to- nose-finger.  Intact rapid alternating movements bilaterally.  Gait is mildly wide-based, mild ataxia when unassisted, more stable with a walker.   Data: Labs 5/202019:  HbA1c 6.4 Labs 03/18/2018: TSH 1.130 Labs 04/05/2018 ESR 21, CRP, folate 14.9, vitamin B12 239, copper 95, vitamin E 6.6  Labs 06/11/2017: TSH 1.4, hemoglobin A1c 6.3, LDL 78, cholesterol 151 triglycerides 142 HDL 47, complete metabolic panel all within normal limits.  Diagnostic studies April 2019 TTE: EF is mildly reduced at 03-70%, grade 1 diastolic dysfunction. Right ventricle appears normal. Left atrium is mildly enlarged, right atrium is normal. Trivial mitral regurgitation and mild tricuspid regurgitation. CT angiogram head and neck: Mild intracranial atherosclerotic disease. Mild calcification and right carotid bulb, less than 50% severity. MRI brain: Acute nonhemorrhagic cerebrovascular accident along the left putamen and left corona radiate.  30-day cardiac monitor - normal  MRI lumbar spine 04/17/2018: Mild edema in the left sacrum, possibly due to recent fracture.  Correlate with pain in this area. MRI of the pelvis with attention to the sacrum could be performed for confirmation depending on the history and physical findings. Mild to moderate spinal stenosis L3-4 with mild subarticular stenosis bilaterally. Grade 1 anterolisthesis L4-5. Mild to moderate spinal stenosis with moderate subarticular stenosis on the left. Mild degenerative changes L5-S1 with mild subarticular stenosis bilaterally.  IMPRESSION/PLAN: 1.  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  She has mild-moderate lumbar spinal stenosis at L3-4 and L4-5 which was viewed, which could also contribute.  Fortunately, PT has helped with her balance and she has not had any more falls.  I stressed the importance of continuing home  exercises.    2.  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 Educated on fall precautions, daily foot inspection, and always using walker.   3.  Vitamin B12 deficiency, continue home monthly injections  4.  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  Continue Plavix 75 mg daily and Lipitor 40 mg daily.   Return to clinic in 6 months    Thank you for allowing me to participate in patient's care.  If I can answer any additional questions, I would be pleased to do so.    Sincerely,    Donika K. Posey Pronto, DO

## 2018-08-12 ENCOUNTER — Ambulatory Visit (INDEPENDENT_AMBULATORY_CARE_PROVIDER_SITE_OTHER): Payer: Medicare PPO | Admitting: Neurology

## 2018-08-12 ENCOUNTER — Encounter: Payer: Self-pay | Admitting: Neurology

## 2018-08-12 VITALS — BP 140/70 | HR 77 | Ht 64.0 in | Wt 136.0 lb

## 2018-08-12 DIAGNOSIS — I639 Cerebral infarction, unspecified: Secondary | ICD-10-CM | POA: Diagnosis not present

## 2018-08-12 DIAGNOSIS — E538 Deficiency of other specified B group vitamins: Secondary | ICD-10-CM | POA: Diagnosis not present

## 2018-08-12 DIAGNOSIS — R278 Other lack of coordination: Secondary | ICD-10-CM

## 2018-08-12 DIAGNOSIS — E1142 Type 2 diabetes mellitus with diabetic polyneuropathy: Secondary | ICD-10-CM

## 2018-08-12 NOTE — Patient Instructions (Addendum)
Keep up with your home physical therapy exercises  Always use your walker  Return to clinic in 6 months  [facility]  Preventing Falls in the [place] are common, often dreaded events in the lives of older people. Aside from the obvious injuries and even death that may result, falls can cause wide-ranging consequences including loss of independence, mental decline, decreased activity, and mobility. Younger people are also at risk of falling, especially those with chronic illnesses and fatigue.  Ways to reduce the risk for falling:  * Examine diet and medications. Warm foods and alcohol dilate blood vessels, which can lead to dizziness when standing. Sleep aids, antidepressants, and pain medications can also increase the likelihood of a fall.  * Get a vison exam. Poor vision, cataracts, and glaucoma increase the chances of falling.  * Check foot gear. Shoes should fit snugly and have a sturdy, nonskid sole and broad, low heel.  * Participate in a physician-approved exercise program to build and maintain muscle strength and improve balance and coordination.  * Increase vitamin D intake. Vitamin D improves muscle strength and increases the amount of calcium the body is able to absorb and deposit in bones.  How to prevent falls from common hazards:  * Floors - Remove all loose wires, cords, and throw rugs. Minimize clutter. Make sure rugs are anchored and smooth. Keep furniture in its usual place.  * Chairs - Use chairs with straight backs, armrests, and firm seats. Add firm cushions to existing pieces to add height.  * Bathroom - Install grab bars and non-skid tape in the tub or shower. Use a bathtub transfer bench or a shower chair with a back support. Use an elevated toilet seat and/or safety rails to assist standing from a low surface. Do not use towel racks or bathroom tissue holders to help you stand.  * Lighting - Make sure halls, stairways, and entrances are well-lit. Install a  night light in your bathroom or hallway. Make sure there is a light switch at the top and bottom of the staircase. Turn lights on if you get up in the middle of the night. Make sure lamps or light switches are within reach of the bed if you have to get up during the night.  * Kitchen - Install non-skid rubber mats near the sink and stove. Clean spills immediately. Store frequently used utensils, pots, and pans between waist and eye level. This helps prevent reaching and bending. Sit when getting things out of the lower cupboards.  * Living room / [place] furniture with wide spaces in between, giving enough room to move around. Establish a route through the living room that gives you something to hold onto as you walk.  * Stairs - Make sure treads, rails, and rugs are secure. Install a rail on both sides of the stairs. If stairs are a threat, it might be helpful to arrange most of your activities on the lower level to reduce the number of times you must climb the stairs.  * Entrances and doorways - Install metal handles on the walls adjacent to the doorknobs of all doors to make it more secure as you travel through the doorway.  Tips for maintaining balance:  * Keep at least one hand free at all times Try using a backpack or fanny pack to hold things rather than carrying them in your hands. Never carry objects in both hands when walking as this interferes with keeping your balance.  *  Consciously lift your feet off the ground when walking. Shuffling and dragging of the feet is a common culprit in losing your balance.  * When trying to navigate turns, use a "U" technique of facing forward and making a wide turn, rather than pivoting sharply.  * Try to stand with your feet shoulder-length apart. When your feet are close together for any length of time, you increase your risk of losing your balance and falling.  * Do one thing at a time. Do not try to walk and accomplish another task, such as reading  or looking around. The decrease in your automatic reflexes complicates motor function, so the less distraction, the better.  * Do not wear rubber or gripping soled shoes, they might "catch" on the floor and cause tripping.  * Move slowly when changing positions. Use deliberate, concentrated movements and, if needed, use a grab bar or walking aid. Count fifteen (15) seconds after standing to begin walking.  * If balance is a continuous problem, you might want to consider a walking aid such as a cane, walking stick, or walker. Once you have mastered walking with help, you may be ready to try it again on your own.  This information is provided by [facility] and is not intended to replace the medical advice of your physician or other health care providers. Please consult your physician or other health care providers for advice regarding your specific medical condition.

## 2019-02-07 ENCOUNTER — Encounter: Payer: Self-pay | Admitting: Neurology

## 2019-02-10 ENCOUNTER — Telehealth (INDEPENDENT_AMBULATORY_CARE_PROVIDER_SITE_OTHER): Payer: Medicare PPO | Admitting: Neurology

## 2019-02-10 ENCOUNTER — Other Ambulatory Visit: Payer: Self-pay

## 2019-02-10 VITALS — Ht 64.0 in | Wt 136.0 lb

## 2019-02-10 DIAGNOSIS — E1142 Type 2 diabetes mellitus with diabetic polyneuropathy: Secondary | ICD-10-CM

## 2019-02-10 DIAGNOSIS — I639 Cerebral infarction, unspecified: Secondary | ICD-10-CM

## 2019-02-10 DIAGNOSIS — E538 Deficiency of other specified B group vitamins: Secondary | ICD-10-CM

## 2019-02-10 DIAGNOSIS — R2681 Unsteadiness on feet: Secondary | ICD-10-CM | POA: Insufficient documentation

## 2019-02-10 NOTE — Progress Notes (Signed)
   Virtual Visit via Video Note The purpose of this virtual visit is to provide medical care while limiting exposure to the novel coronavirus.    Consent was obtained for video visit:  Yes.   Answered questions that patient had about telehealth interaction:  Yes.   I discussed the limitations, risks, security and privacy concerns of performing an evaluation and management service by telemedicine. I also discussed with the patient that there may be a patient responsible charge related to this service. The patient expressed understanding and agreed to proceed.  Pt location: Home Physician Location: office Name of referring provider:  [name], PA-C I connected with Teresa Daniels at patients initiation/request on 02/10/2019 at 10:50 AM EDT by video enabled telemedicine application and verified that I am speaking with the correct person using two identifiers. Pt MRN:  [id number] Pt DOB:  August 15, 1940 Video Participants:  Teresa Daniels;  daughter   History of Present Illness: This is a 78 y.o. female returning for follow-up of gait ataxia, cerebrovascular disease, and diabetic neuropathy.  She lives in [place] in independent living and has been doing well.  She completed physical therapy for gait training which helped some.  She does continue to have sense of imbalance and has been compliant with using her Rollator.  She suffered a fall a month ago after her shoe got caught in something.  Fortunately, she did not hurt herself.  She continues to get monthly B12 injections and has 2 more cycles of this.  Her neuropathy is stable and involves the feet.  She does have stinging pain and takes gabapentin 300 mg at bedtime.  No interval hospitalizations for TIA or stroke.   Observations/Objective:   Vitals:   02/07/19 1417  Weight: 136 lb (61.7 kg)  Height: 5\' 4"  (1.626 m)   Patient is awake, alert, and appears comfortable.  Oriented x 4.   Extraocular muscles are intact. No ptosis. Mild  flattening of the right nasolabial fold. Speech is not dysarthric. Tongue is midline. Antigravity in all extremities.  No pronator drift.  She is able to stand up from so far, without using arms to push off. Gait is assisted with a walker and appears stable.   Assessment and Plan:  1.  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  -Continue Plavix 75 mg daily and Lipitor 40 mg daily  2.  Diabetic peripheral neuropathy with painful paresthesias and sensory ataxia.  -Continue gabapentin 300 mg at bedtime  -Continue home balance exercises  -Always use a walker  3.  Multifactorial gait instability due to neuropathy and right leg weakness.  -Fall precautions discussed  4.  Vitamin B12 deficiency  -Continue vitamin B12 injections for 2 more months, then transition to oral vitamin B12 1000 mcg daily   Follow Up Instructions:   I discussed the assessment and treatment plan with the patient. The patient was provided an opportunity to ask questions and all were answered. The patient agreed with the plan and demonstrated an understanding of the instructions.   The patient was advised to call back or seek an in-person evaluation if the symptoms worsen or if the condition fails to improve as anticipated.  Follow-up in year  Total time spent:  25 minutes     [name], DO

## 2019-08-07 ENCOUNTER — Encounter (HOSPITAL_COMMUNITY): Payer: Self-pay | Admitting: Emergency Medicine

## 2019-08-07 ENCOUNTER — Inpatient Hospital Stay (HOSPITAL_COMMUNITY)
Admission: EM | Admit: 2019-08-07 | Discharge: 2019-08-10 | DRG: 177 | Disposition: A | Discharge status: Home Health | Payer: Medicare PPO | Source: Skilled Nursing Facility | Attending: Internal Medicine | Admitting: Internal Medicine

## 2019-08-07 ENCOUNTER — Emergency Department (HOSPITAL_COMMUNITY): Payer: Medicare PPO

## 2019-08-07 ENCOUNTER — Other Ambulatory Visit: Payer: Self-pay

## 2019-08-07 DIAGNOSIS — E860 Dehydration: Secondary | ICD-10-CM

## 2019-08-07 DIAGNOSIS — E1142 Type 2 diabetes mellitus with diabetic polyneuropathy: Secondary | ICD-10-CM | POA: Diagnosis present

## 2019-08-07 DIAGNOSIS — G9349 Other encephalopathy: Secondary | ICD-10-CM | POA: Diagnosis present

## 2019-08-07 DIAGNOSIS — Z87891 Personal history of nicotine dependence: Secondary | ICD-10-CM

## 2019-08-07 DIAGNOSIS — I129 Hypertensive chronic kidney disease with stage 1 through stage 4 chronic kidney disease, or unspecified chronic kidney disease: Secondary | ICD-10-CM | POA: Diagnosis present

## 2019-08-07 DIAGNOSIS — E1122 Type 2 diabetes mellitus with diabetic chronic kidney disease: Secondary | ICD-10-CM | POA: Diagnosis present

## 2019-08-07 DIAGNOSIS — E538 Deficiency of other specified B group vitamins: Secondary | ICD-10-CM | POA: Diagnosis present

## 2019-08-07 DIAGNOSIS — Y92239 Unspecified place in hospital as the place of occurrence of the external cause: Secondary | ICD-10-CM | POA: Diagnosis not present

## 2019-08-07 DIAGNOSIS — U071 COVID-19: Secondary | ICD-10-CM | POA: Diagnosis not present

## 2019-08-07 DIAGNOSIS — Z79899 Other long term (current) drug therapy: Secondary | ICD-10-CM

## 2019-08-07 DIAGNOSIS — J9601 Acute respiratory failure with hypoxia: Secondary | ICD-10-CM

## 2019-08-07 DIAGNOSIS — Z7902 Long term (current) use of antithrombotics/antiplatelets: Secondary | ICD-10-CM

## 2019-08-07 DIAGNOSIS — N183 Chronic kidney disease, stage 3 unspecified: Secondary | ICD-10-CM

## 2019-08-07 DIAGNOSIS — Z7984 Long term (current) use of oral hypoglycemic drugs: Secondary | ICD-10-CM

## 2019-08-07 DIAGNOSIS — J45909 Unspecified asthma, uncomplicated: Secondary | ICD-10-CM | POA: Diagnosis present

## 2019-08-07 DIAGNOSIS — T380X5A Adverse effect of glucocorticoids and synthetic analogues, initial encounter: Secondary | ICD-10-CM | POA: Diagnosis not present

## 2019-08-07 DIAGNOSIS — J1282 Pneumonia due to coronavirus disease 2019: Secondary | ICD-10-CM | POA: Diagnosis present

## 2019-08-07 DIAGNOSIS — D631 Anemia in chronic kidney disease: Secondary | ICD-10-CM | POA: Diagnosis present

## 2019-08-07 DIAGNOSIS — D649 Anemia, unspecified: Secondary | ICD-10-CM

## 2019-08-07 DIAGNOSIS — Z8673 Personal history of transient ischemic attack (TIA), and cerebral infarction without residual deficits: Secondary | ICD-10-CM

## 2019-08-07 MED ORDER — ACETAMINOPHEN 500 MG PO TABS
1000.0000 mg | ORAL_TABLET | Freq: Once | ORAL | Status: AC
  Administered 2019-08-07: 1000 mg via ORAL
  Filled 2019-08-07: qty 2

## 2019-08-07 NOTE — ED Provider Notes (Signed)
Pt with COVID 19 Plan to check labs and admit    Ripley Fraise, MD 08/07/19 2351

## 2019-08-07 NOTE — ED Provider Notes (Signed)
[facility] EMERGENCY DEPARTMENT Provider Note   CSN: [id number] Arrival date & time: 08/07/19  2027     History Chief Complaint  Patient presents with  . Generalized weakness and Fever    Teresa Daniels is a 79 y.o. female.  79yo F w/ PMH including CVA, HTN, T2DM, asthma who p/w fever and weakness. Nursing facility reported to EMS 1 week of intermittent fevers and family reported lethargy today.  Patient herself states that she has been feeling weak for the past couple of weeks.  She reports that she has had a cough for "forever" and denies any associated shortness of breath, chest pain, or sore throat.  She has had some diarrhea.  No loss of taste or smell. Daughter called just after patient arrival stating that COVID-19 test came back positive.  The history is provided by the patient.       Past Medical History:  Diagnosis Date  . Asthma   . Diabetes mellitus without complication ([place])   . Hypertension   . Stroke [facility])     Patient Active Problem List   Diagnosis Date Noted  . Vitamin B12 deficiency 02/10/2019  . Unsteady gait 02/10/2019  . Ischemic stroke ([place]) 12/06/2017  . Diabetic polyneuropathy associated with type 2 diabetes mellitus ([place]) 12/06/2017    Past Surgical History:  Procedure Laterality Date  . CESAREAN SECTION       OB History   No obstetric history on file.     No family history on file.  Social History   Tobacco Use  . Smoking status: Former Smoker    Quit date: 1969    Years since quitting: 52.1  . Smokeless tobacco: Never Used  Substance Use Topics  . Alcohol use: Not Currently  . Drug use: Never    Home Medications Prior to Admission medications   Medication Sig Start Date End Date Taking? Authorizing Provider  Ascorbic Acid (VITAMIN C PO) Take by mouth.    [provider]  atorvastatin (LIPITOR) 40 MG tablet Take by mouth.    [provider]  beclomethasone (QVAR) 40 MCG/ACT inhaler Inhale  into the lungs.    [provider]  cholecalciferol (VITAMIN D) 1000 units tablet Take by mouth.    [provider]  clopidogrel (PLAVIX) 75 MG tablet Take by mouth.    [provider]  cyanocobalamin (,VITAMIN B-12,) 1000 MCG/ML injection 1 ml IM daily x 7 days then 1 ml IM weekly x 4 weeks then 1 ml IM monthly x 1 year. 04/09/18   Narda Amber K, DO  ferrous sulfate 325 (65 FE) MG tablet Take by mouth.    [provider]  gabapentin (NEURONTIN) 300 MG capsule Take one capsule at bedtime. 11/26/17   [provider]  hydrochlorothiazide (HYDRODIURIL) 25 MG tablet Take by mouth. 11/26/17   [provider]  levocetirizine (XYZAL) 5 MG tablet Take by mouth. 11/26/17   [provider]  losartan (COZAAR) 50 MG tablet Take by mouth.    [provider]  MAGNESIUM PO Take by mouth.    [provider]  metFORMIN (GLUCOPHAGE) 1000 MG tablet Take 1,000 mg by mouth. bid    [provider]  mirtazapine (REMERON) 15 MG tablet TAKE 1 TABLET BY MOUTH EVERY NIGHT AT BEDTIME 04/25/18   [provider]  montelukast (SINGULAIR) 10 MG tablet Take by mouth.    [provider]  Omega-3 1000 MG CAPS Take by mouth.    [provider]  omega-3 acid ethyl esters (LOVAZA) 1 g capsule Take by mouth.    [provider]  pantoprazole (PROTONIX) 40 MG tablet Take by mouth.    [provider]  sertraline (ZOLOFT) 100 MG tablet Take by mouth. 02/18/18   [provider]    Allergies    Patient has no known allergies.  Review of Systems   Review of Systems All other systems reviewed and are negative except that which was mentioned in HPI  Physical Exam Updated Vital Signs BP (!) 105/41   Pulse 89   Temp (!) 101 F (38.3 C) (Oral)   Resp 18   Ht 5' 4.5" (1.638 m)   Wt 63.5 kg   SpO2 95%   BMI 23.66 kg/m   Physical Exam Vitals and nursing note reviewed.  Constitutional:       General: She is not in acute distress.    Appearance: She is well-developed.  HENT:     Head: Normocephalic and atraumatic.  Eyes:     Conjunctiva/sclera: Conjunctivae normal.  Cardiovascular:     Rate and Rhythm: Normal rate and regular rhythm.     Heart sounds: Murmur present.  Pulmonary:     Effort: Pulmonary effort is normal. No respiratory distress.     Breath sounds: Rales present.  Abdominal:     General: Bowel sounds are normal. There is no distension.     Palpations: Abdomen is soft.     Tenderness: There is no abdominal tenderness.  Musculoskeletal:     Cervical back: Neck supple.  Skin:    General: Skin is warm and dry.  Neurological:     Mental Status: She is alert and oriented to person, place, and time.     Comments: Fluent speech     ED Results / Procedures / Treatments   Labs (all labs ordered are listed, but only abnormal results are displayed) Labs Reviewed  CULTURE, BLOOD (ROUTINE X 2)  CULTURE, BLOOD (ROUTINE X 2)  LACTIC ACID, PLASMA  LACTIC ACID, PLASMA  CBC WITH DIFFERENTIAL/PLATELET  COMPREHENSIVE METABOLIC PANEL  D-DIMER, QUANTITATIVE (NOT AT [facility])  PROCALCITONIN  LACTATE DEHYDROGENASE  FERRITIN  TRIGLYCERIDES  FIBRINOGEN  C-REACTIVE PROTEIN    EKG None  Radiology DG Chest Port 1 View  Result Date: 08/07/2019 CLINICAL DATA:  Fever. EXAM: PORTABLE CHEST 1 VIEW COMPARISON:  None. FINDINGS: Mildly increased lung markings are seen without evidence of acute infiltrate, pleural effusion or pneumothorax. The heart size and mediastinal contours are within normal limits. Multilevel degenerative changes are seen throughout the thoracic spine. IMPRESSION: No active disease. Electronically Signed   By: Virgina Norfolk M.D.   On: 08/07/2019 22:33    Procedures Procedures (including critical care time)  Medications Ordered in ED Medications  acetaminophen (TYLENOL) tablet 1,000 mg (1,000 mg Oral Given 08/07/19 2321)    ED Course  I have  reviewed the triage vital signs and the nursing notes.  Pertinent labs & imaging results that were available during my care of the patient were reviewed by me and considered in my medical decision making (see chart for details).    MDM Rules/Calculators/A&P                      Alert and pleasant on exam, T 101, O2 saturation in the mid to high 90s on 2 L. CXR clear. Labwork is pending and I am signing out to oncoming provider.   Anjelique Ogaz was evaluated in Emergency  Department on 08/07/2019 for the symptoms described in the history of present illness. She was evaluated in the context of the global COVID-19 pandemic, which necessitated consideration that the patient might be at risk for infection with the SARS-CoV-2 virus that causes COVID-19. Institutional protocols and algorithms that pertain to the evaluation of patients at risk for COVID-19 are in a state of rapid change based on information released by regulatory bodies including the CDC and federal and state organizations. These policies and algorithms were followed during the patient's care in the ED.  Final Clinical Impression(s) / ED Diagnoses Final diagnoses:  None    Rx / DC Orders ED Discharge Orders    None       Kalene Cutler, Wenda Overland, MD 08/07/19 [phone]

## 2019-08-07 NOTE — ED Notes (Signed)
Pt's daughter called stating that pt's Covid test came back short while ago and is positive.

## 2019-08-07 NOTE — ED Triage Notes (Signed)
Pt to ED via [facility] from [facility] with c/o fever off and on x's 1 week.  Family reports pt has been lethargic today, normally A&O x's 3 and walks with a walker

## 2019-08-08 ENCOUNTER — Encounter (HOSPITAL_COMMUNITY): Payer: Self-pay | Admitting: Emergency Medicine

## 2019-08-08 ENCOUNTER — Other Ambulatory Visit: Payer: Self-pay

## 2019-08-08 DIAGNOSIS — E1122 Type 2 diabetes mellitus with diabetic chronic kidney disease: Secondary | ICD-10-CM | POA: Diagnosis present

## 2019-08-08 DIAGNOSIS — J9601 Acute respiratory failure with hypoxia: Secondary | ICD-10-CM

## 2019-08-08 DIAGNOSIS — N183 Chronic kidney disease, stage 3 unspecified: Secondary | ICD-10-CM | POA: Diagnosis present

## 2019-08-08 DIAGNOSIS — D631 Anemia in chronic kidney disease: Secondary | ICD-10-CM

## 2019-08-08 DIAGNOSIS — E860 Dehydration: Secondary | ICD-10-CM | POA: Diagnosis present

## 2019-08-08 DIAGNOSIS — E1142 Type 2 diabetes mellitus with diabetic polyneuropathy: Secondary | ICD-10-CM

## 2019-08-08 DIAGNOSIS — Z8673 Personal history of transient ischemic attack (TIA), and cerebral infarction without residual deficits: Secondary | ICD-10-CM | POA: Diagnosis not present

## 2019-08-08 DIAGNOSIS — Z7902 Long term (current) use of antithrombotics/antiplatelets: Secondary | ICD-10-CM | POA: Diagnosis not present

## 2019-08-08 DIAGNOSIS — Z79899 Other long term (current) drug therapy: Secondary | ICD-10-CM | POA: Diagnosis not present

## 2019-08-08 DIAGNOSIS — I129 Hypertensive chronic kidney disease with stage 1 through stage 4 chronic kidney disease, or unspecified chronic kidney disease: Secondary | ICD-10-CM | POA: Diagnosis present

## 2019-08-08 DIAGNOSIS — D649 Anemia, unspecified: Secondary | ICD-10-CM

## 2019-08-08 DIAGNOSIS — J45909 Unspecified asthma, uncomplicated: Secondary | ICD-10-CM | POA: Diagnosis present

## 2019-08-08 DIAGNOSIS — U071 COVID-19: Secondary | ICD-10-CM | POA: Diagnosis present

## 2019-08-08 DIAGNOSIS — Z7984 Long term (current) use of oral hypoglycemic drugs: Secondary | ICD-10-CM | POA: Diagnosis not present

## 2019-08-08 DIAGNOSIS — Z87891 Personal history of nicotine dependence: Secondary | ICD-10-CM | POA: Diagnosis not present

## 2019-08-08 DIAGNOSIS — T380X5A Adverse effect of glucocorticoids and synthetic analogues, initial encounter: Secondary | ICD-10-CM | POA: Diagnosis not present

## 2019-08-08 DIAGNOSIS — J1282 Pneumonia due to coronavirus disease 2019: Secondary | ICD-10-CM | POA: Diagnosis present

## 2019-08-08 DIAGNOSIS — Y92239 Unspecified place in hospital as the place of occurrence of the external cause: Secondary | ICD-10-CM | POA: Diagnosis not present

## 2019-08-08 DIAGNOSIS — E538 Deficiency of other specified B group vitamins: Secondary | ICD-10-CM | POA: Diagnosis present

## 2019-08-08 DIAGNOSIS — G9349 Other encephalopathy: Secondary | ICD-10-CM | POA: Diagnosis present

## 2019-08-08 LAB — COMPREHENSIVE METABOLIC PANEL
ALT: 13 U/L (ref 0–44)
ALT: 15 U/L (ref 0–44)
AST: 21 U/L (ref 15–41)
AST: 23 U/L (ref 15–41)
Albumin: 3 g/dL — ABNORMAL LOW (ref 3.5–5.0)
Albumin: 3.3 g/dL — ABNORMAL LOW (ref 3.5–5.0)
Alkaline Phosphatase: 62 U/L (ref 38–126)
Alkaline Phosphatase: 63 U/L (ref 38–126)
Anion gap: 13 (ref 5–15)
Anion gap: 13 (ref 5–15)
BUN: 34 mg/dL — ABNORMAL HIGH (ref 8–23)
BUN: 38 mg/dL — ABNORMAL HIGH (ref 8–23)
CO2: 23 mmol/L (ref 22–32)
CO2: 25 mmol/L (ref 22–32)
Calcium: 7.6 mg/dL — ABNORMAL LOW (ref 8.9–10.3)
Calcium: 8.5 mg/dL — ABNORMAL LOW (ref 8.9–10.3)
Chloride: 100 mmol/L (ref 98–111)
Chloride: 105 mmol/L (ref 98–111)
Creatinine, Ser: 1.59 mg/dL — ABNORMAL HIGH (ref 0.44–1.00)
Creatinine, Ser: 1.96 mg/dL — ABNORMAL HIGH (ref 0.44–1.00)
GFR calc Af Amer: 28 mL/min — ABNORMAL LOW (ref >60)
GFR calc Af Amer: 36 mL/min — ABNORMAL LOW (ref >60)
GFR calc non Af Amer: 24 mL/min — ABNORMAL LOW (ref >60)
GFR calc non Af Amer: 31 mL/min — ABNORMAL LOW (ref >60)
Glucose, Bld: 171 mg/dL — ABNORMAL HIGH (ref 70–99)
Glucose, Bld: 190 mg/dL — ABNORMAL HIGH (ref 70–99)
Potassium: 3.6 mmol/L (ref 3.5–5.1)
Potassium: 3.8 mmol/L (ref 3.5–5.1)
Sodium: 138 mmol/L (ref 135–145)
Sodium: 141 mmol/L (ref 135–145)
Total Bilirubin: 0.4 mg/dL (ref 0.3–1.2)
Total Bilirubin: 0.5 mg/dL (ref 0.3–1.2)
Total Protein: 5.8 g/dL — ABNORMAL LOW (ref 6.5–8.1)
Total Protein: 6.1 g/dL — ABNORMAL LOW (ref 6.5–8.1)

## 2019-08-08 LAB — CBC WITH DIFFERENTIAL/PLATELET
Abs Immature Granulocytes: 0.01 10*3/uL (ref 0.00–0.07)
Abs Immature Granulocytes: 0.02 10*3/uL (ref 0.00–0.07)
Basophils Absolute: 0 10*3/uL (ref 0.0–0.1)
Basophils Absolute: 0 10*3/uL (ref 0.0–0.1)
Basophils Relative: 0 %
Basophils Relative: 0 %
Eosinophils Absolute: 0 10*3/uL (ref 0.0–0.5)
Eosinophils Absolute: 0 10*3/uL (ref 0.0–0.5)
Eosinophils Relative: 0 %
Eosinophils Relative: 0 %
HCT: 27.4 % — ABNORMAL LOW (ref 36.0–46.0)
HCT: 28.8 % — ABNORMAL LOW (ref 36.0–46.0)
Hemoglobin: 8.8 g/dL — ABNORMAL LOW (ref 12.0–15.0)
Hemoglobin: 9.4 g/dL — ABNORMAL LOW (ref 12.0–15.0)
Immature Granulocytes: 0 %
Immature Granulocytes: 0 %
Lymphocytes Relative: 18 %
Lymphocytes Relative: 8 %
Lymphs Abs: 0.5 10*3/uL — ABNORMAL LOW (ref 0.7–4.0)
Lymphs Abs: 1.1 10*3/uL (ref 0.7–4.0)
MCH: 33 pg (ref 26.0–34.0)
MCH: 33.5 pg (ref 26.0–34.0)
MCHC: 32.1 g/dL (ref 30.0–36.0)
MCHC: 32.6 g/dL (ref 30.0–36.0)
MCV: 102.5 fL — ABNORMAL HIGH (ref 80.0–100.0)
MCV: 102.6 fL — ABNORMAL HIGH (ref 80.0–100.0)
Monocytes Absolute: 0.2 10*3/uL (ref 0.1–1.0)
Monocytes Absolute: 0.3 10*3/uL (ref 0.1–1.0)
Monocytes Relative: 4 %
Monocytes Relative: 5 %
Neutro Abs: 4.8 10*3/uL (ref 1.7–7.7)
Neutro Abs: 5.6 10*3/uL (ref 1.7–7.7)
Neutrophils Relative %: 77 %
Neutrophils Relative %: 88 %
Platelets: 138 10*3/uL — ABNORMAL LOW (ref 150–400)
Platelets: 144 10*3/uL — ABNORMAL LOW (ref 150–400)
RBC: 2.67 MIL/uL — ABNORMAL LOW (ref 3.87–5.11)
RBC: 2.81 MIL/uL — ABNORMAL LOW (ref 3.87–5.11)
RDW: 12.9 % (ref 11.5–15.5)
RDW: 13 % (ref 11.5–15.5)
WBC: 6.3 10*3/uL (ref 4.0–10.5)
WBC: 6.4 10*3/uL (ref 4.0–10.5)
nRBC: 0 % (ref 0.0–0.2)
nRBC: 0 % (ref 0.0–0.2)

## 2019-08-08 LAB — GLUCOSE, CAPILLARY
Glucose-Capillary: 129 mg/dL — ABNORMAL HIGH (ref 70–99)
Glucose-Capillary: 155 mg/dL — ABNORMAL HIGH (ref 70–99)
Glucose-Capillary: 171 mg/dL — ABNORMAL HIGH (ref 70–99)
Glucose-Capillary: 198 mg/dL — ABNORMAL HIGH (ref 70–99)
Glucose-Capillary: 89 mg/dL (ref 70–99)

## 2019-08-08 LAB — LACTATE DEHYDROGENASE
LDH: 157 U/L (ref 98–192)
LDH: 164 U/L (ref 98–192)

## 2019-08-08 LAB — POCT I-STAT 7, (LYTES, BLD GAS, ICA,H+H)
Acid-Base Excess: 1 mmol/L (ref 0.0–2.0)
Bicarbonate: 27.6 mmol/L (ref 20.0–28.0)
Calcium, Ion: 1.2 mmol/L (ref 1.15–1.40)
HCT: 25 % — ABNORMAL LOW (ref 36.0–46.0)
Hemoglobin: 8.5 g/dL — ABNORMAL LOW (ref 12.0–15.0)
O2 Saturation: 98 %
Patient temperature: 101
Potassium: 3.6 mmol/L (ref 3.5–5.1)
Sodium: 139 mmol/L (ref 135–145)
TCO2: 29 mmol/L (ref 22–32)
pCO2 arterial: 54.5 mmHg — ABNORMAL HIGH (ref 32.0–48.0)
pH, Arterial: 7.318 — ABNORMAL LOW (ref 7.350–7.450)
pO2, Arterial: 121 mmHg — ABNORMAL HIGH (ref 83.0–108.0)

## 2019-08-08 LAB — ABO/RH: ABO/RH(D): A POS

## 2019-08-08 LAB — TROPONIN I (HIGH SENSITIVITY): Troponin I (High Sensitivity): 14 ng/L (ref <18)

## 2019-08-08 LAB — FERRITIN
Ferritin: 148 ng/mL (ref 11–307)
Ferritin: 160 ng/mL (ref 11–307)

## 2019-08-08 LAB — D-DIMER, QUANTITATIVE
D-Dimer, Quant: 1.52 ug/mL-FEU — ABNORMAL HIGH (ref 0.00–0.50)
D-Dimer, Quant: 1.79 ug/mL-FEU — ABNORMAL HIGH (ref 0.00–0.50)

## 2019-08-08 LAB — HEMOGLOBIN A1C
Hgb A1c MFr Bld: 6.3 % — ABNORMAL HIGH (ref 4.8–5.6)
Mean Plasma Glucose: 134.11 mg/dL

## 2019-08-08 LAB — LACTIC ACID, PLASMA
Lactic Acid, Venous: 1.1 mmol/L (ref 0.5–1.9)
Lactic Acid, Venous: 2.3 mmol/L (ref 0.5–1.9)

## 2019-08-08 LAB — FIBRINOGEN
Fibrinogen: 485 mg/dL — ABNORMAL HIGH (ref 210–475)
Fibrinogen: 485 mg/dL — ABNORMAL HIGH (ref 210–475)

## 2019-08-08 LAB — PROCALCITONIN: Procalcitonin: 1.59 ng/mL

## 2019-08-08 LAB — C-REACTIVE PROTEIN
CRP: 1.9 mg/dL — ABNORMAL HIGH (ref <1.0)
CRP: 5.1 mg/dL — ABNORMAL HIGH (ref <1.0)

## 2019-08-08 LAB — TRIGLYCERIDES: Triglycerides: 194 mg/dL — ABNORMAL HIGH (ref <150)

## 2019-08-08 LAB — CBG MONITORING, ED: Glucose-Capillary: 166 mg/dL — ABNORMAL HIGH (ref 70–99)

## 2019-08-08 MED ORDER — VITAMIN D 25 MCG (1000 UNIT) PO TABS
1000.0000 [IU] | ORAL_TABLET | Freq: Every day | ORAL | Status: DC
  Administered 2019-08-09 – 2019-08-10 (×2): 1000 [IU] via ORAL
  Filled 2019-08-08 (×2): qty 1

## 2019-08-08 MED ORDER — FERROUS SULFATE 325 (65 FE) MG PO TABS
325.0000 mg | ORAL_TABLET | Freq: Every day | ORAL | Status: DC
  Administered 2019-08-09 – 2019-08-10 (×2): 325 mg via ORAL
  Filled 2019-08-08 (×3): qty 1

## 2019-08-08 MED ORDER — ATORVASTATIN CALCIUM 40 MG PO TABS
40.0000 mg | ORAL_TABLET | Freq: Every day | ORAL | Status: DC
  Administered 2019-08-09: 40 mg via ORAL
  Filled 2019-08-08: qty 1

## 2019-08-08 MED ORDER — SODIUM CHLORIDE 0.9 % IV SOLN
200.0000 mg | Freq: Once | INTRAVENOUS | Status: AC
  Administered 2019-08-08: 200 mg via INTRAVENOUS
  Filled 2019-08-08: qty 200

## 2019-08-08 MED ORDER — ACETAMINOPHEN 325 MG PO TABS
650.0000 mg | ORAL_TABLET | Freq: Four times a day (QID) | ORAL | Status: DC | PRN
  Administered 2019-08-08: 650 mg via ORAL
  Filled 2019-08-08: qty 2

## 2019-08-08 MED ORDER — SODIUM CHLORIDE 0.9 % IV SOLN
1.0000 g | Freq: Every day | INTRAVENOUS | Status: DC
  Administered 2019-08-08: 1 g via INTRAVENOUS
  Filled 2019-08-08: qty 10

## 2019-08-08 MED ORDER — HEPARIN SODIUM (PORCINE) 5000 UNIT/ML IJ SOLN
5000.0000 [IU] | Freq: Three times a day (TID) | INTRAMUSCULAR | Status: DC
  Administered 2019-08-08 – 2019-08-10 (×7): 5000 [IU] via SUBCUTANEOUS
  Filled 2019-08-08 (×7): qty 1

## 2019-08-08 MED ORDER — OMEGA-3-ACID ETHYL ESTERS 1 G PO CAPS
1.0000 g | ORAL_CAPSULE | Freq: Every day | ORAL | Status: DC
  Administered 2019-08-09 – 2019-08-10 (×2): 1 g via ORAL
  Filled 2019-08-08 (×2): qty 1

## 2019-08-08 MED ORDER — SODIUM CHLORIDE 0.9 % IV SOLN
100.0000 mg | Freq: Every day | INTRAVENOUS | Status: DC
  Administered 2019-08-09 – 2019-08-10 (×2): 100 mg via INTRAVENOUS
  Filled 2019-08-08 (×2): qty 20

## 2019-08-08 MED ORDER — CLOPIDOGREL BISULFATE 75 MG PO TABS
75.0000 mg | ORAL_TABLET | Freq: Every day | ORAL | Status: DC
  Administered 2019-08-09 – 2019-08-10 (×2): 75 mg via ORAL
  Filled 2019-08-08 (×2): qty 1

## 2019-08-08 MED ORDER — INSULIN ASPART 100 UNIT/ML ~~LOC~~ SOLN
0.0000 [IU] | SUBCUTANEOUS | Status: DC
  Administered 2019-08-08 (×3): 2 [IU] via SUBCUTANEOUS
  Administered 2019-08-08: 1 [IU] via SUBCUTANEOUS
  Administered 2019-08-08 – 2019-08-09 (×2): 2 [IU] via SUBCUTANEOUS
  Administered 2019-08-09: 3 [IU] via SUBCUTANEOUS
  Administered 2019-08-09: 2 [IU] via SUBCUTANEOUS

## 2019-08-08 MED ORDER — SODIUM CHLORIDE 0.9% FLUSH
3.0000 mL | Freq: Two times a day (BID) | INTRAVENOUS | Status: DC
  Administered 2019-08-08 – 2019-08-10 (×4): 3 mL via INTRAVENOUS

## 2019-08-08 MED ORDER — GUAIFENESIN-DM 100-10 MG/5ML PO SYRP: 10.0000 mL | ORAL_SOLUTION | ORAL | Status: DC | PRN

## 2019-08-08 MED ORDER — HYDROCOD POLST-CPM POLST ER 10-8 MG/5ML PO SUER: 5.0000 mL | Freq: Two times a day (BID) | ORAL | Status: DC | PRN

## 2019-08-08 MED ORDER — ZINC SULFATE 220 (50 ZN) MG PO CAPS
220.0000 mg | ORAL_CAPSULE | Freq: Every day | ORAL | Status: DC
  Administered 2019-08-08 – 2019-08-10 (×3): 220 mg via ORAL
  Filled 2019-08-08 (×3): qty 1

## 2019-08-08 MED ORDER — SODIUM CHLORIDE 0.9 % IV SOLN: INTRAVENOUS | Status: DC

## 2019-08-08 MED ORDER — MONTELUKAST SODIUM 10 MG PO TABS
10.0000 mg | ORAL_TABLET | Freq: Every day | ORAL | Status: DC
  Administered 2019-08-09: 10 mg via ORAL
  Filled 2019-08-08: qty 1

## 2019-08-08 MED ORDER — ONDANSETRON HCL 4 MG/2ML IJ SOLN
4.0000 mg | Freq: Four times a day (QID) | INTRAMUSCULAR | Status: DC | PRN
  Filled 2019-08-08: qty 2

## 2019-08-08 MED ORDER — PANTOPRAZOLE SODIUM 40 MG PO TBEC
40.0000 mg | DELAYED_RELEASE_TABLET | Freq: Every day | ORAL | Status: DC
  Administered 2019-08-09 – 2019-08-10 (×2): 40 mg via ORAL
  Filled 2019-08-08 (×2): qty 1

## 2019-08-08 MED ORDER — SODIUM CHLORIDE 0.9% FLUSH
3.0000 mL | INTRAVENOUS | Status: DC | PRN
  Administered 2019-08-10: 3 mL via INTRAVENOUS

## 2019-08-08 MED ORDER — SODIUM CHLORIDE 0.9 % IV SOLN: 250.0000 mL | INTRAVENOUS | Status: DC | PRN

## 2019-08-08 MED ORDER — ONDANSETRON HCL 4 MG PO TABS: 4.0000 mg | ORAL_TABLET | Freq: Four times a day (QID) | ORAL | Status: DC | PRN

## 2019-08-08 MED ORDER — SODIUM CHLORIDE 0.9 % IV SOLN
500.0000 mg | Freq: Every day | INTRAVENOUS | Status: DC
  Administered 2019-08-08: 05:00:00 500 mg via INTRAVENOUS
  Filled 2019-08-08: qty 500

## 2019-08-08 MED ORDER — HEPARIN SODIUM (PORCINE) 5000 UNIT/ML IJ SOLN: 5000.0000 [IU] | Freq: Three times a day (TID) | INTRAMUSCULAR | Status: DC

## 2019-08-08 MED ORDER — ASCORBIC ACID 500 MG PO TABS
500.0000 mg | ORAL_TABLET | Freq: Every day | ORAL | Status: DC
  Administered 2019-08-09 – 2019-08-10 (×2): 500 mg via ORAL
  Filled 2019-08-08 (×2): qty 1

## 2019-08-08 MED ORDER — ALBUTEROL SULFATE HFA 108 (90 BASE) MCG/ACT IN AERS
2.0000 | INHALATION_SPRAY | Freq: Four times a day (QID) | RESPIRATORY_TRACT | Status: DC
  Administered 2019-08-08 (×3): 2 via RESPIRATORY_TRACT
  Filled 2019-08-08 (×2): qty 6.7

## 2019-08-08 MED ORDER — SODIUM CHLORIDE 0.9 % IV BOLUS (SEPSIS)
500.0000 mL | Freq: Once | INTRAVENOUS | Status: AC
  Administered 2019-08-08: 01:00:00 500 mL via INTRAVENOUS

## 2019-08-08 MED ORDER — DEXAMETHASONE SODIUM PHOSPHATE 10 MG/ML IJ SOLN
6.0000 mg | INTRAMUSCULAR | Status: DC
  Administered 2019-08-08 – 2019-08-10 (×3): 6 mg via INTRAVENOUS
  Filled 2019-08-08 (×3): qty 1

## 2019-08-08 NOTE — Progress Notes (Signed)
The chaplain left the AD paperwork with the nurse to give to the patient.  The chaplain also included the contact information if the patient has any questions.  Brion Aliment Chaplain Resident For questions concerning this note please contact me by pager [phone]

## 2019-08-08 NOTE — ED Provider Notes (Signed)
Patient sleeping but arousable Pressures did go in the 90s but then rebounded to the 100s. She was given IV fluids No significant hypercapnia. She does have an oxygen requirement of 2 L/min Discussed with Dr. Criss Alvine for admission   Ripley Fraise, MD 08/08/19 [phone]

## 2019-08-08 NOTE — H&P (Signed)
History and Physical    Teresa Daniels [id number] DOB: Feb 03, 1941 DOA: 08/07/2019  PCP: [name], PA-C    Patient coming from: Nursing home    Chief Complaint: Change in mental status, lethargy, cough  HPI: Teresa Daniels is a 79 y.o. female with medical history significant of hypertension CVA diabetes mellitus type 2 asthma, nursing home resident came with a chief complaint shortness of breath, general weakness, change in mental status lethargy for the last few days, she has some diarrhea as well.  No loss of taste or smell. Daughter called just after patient arrival stating that COVID-19 test came back positive.  ED Course:  In the emergency room she was very lethargic, Hypoxic started on 2 L of oxygen ABG was done hypoxemia with mild elevated CO2 Chest x-ray no pneumonia May need CT chest Procalcitonin elevated Inflammatory marker for Covid mildly elevated  Review of Systems: As per HPI otherwise 10 point review of systems negative.  With exception change in mental status lethargic cough general weakness  Past Medical History:  Diagnosis Date  . Asthma   . Diabetes mellitus without complication ([place])   . Hypertension   . Stroke [facility])     Past Surgical History:  Procedure Laterality Date  . CESAREAN SECTION       reports that she quit smoking about 52 years ago. She has never used smokeless tobacco. She reports previous alcohol use. She reports that she does not use drugs.  No Known Allergies  History reviewed. No pertinent family history.   Prior to Admission medications   Medication Sig Start Date End Date Taking? Authorizing Provider  Ascorbic Acid (VITAMIN C PO) Take by mouth.    [provider]  atorvastatin (LIPITOR) 40 MG tablet Take by mouth.    [provider]  beclomethasone (QVAR) 40 MCG/ACT inhaler Inhale into the lungs.    [provider]  cholecalciferol (VITAMIN D) 1000 units tablet Take by mouth.    [provider]  clopidogrel (PLAVIX) 75 MG tablet Take by mouth.    [provider]  cyanocobalamin (,VITAMIN B-12,) 1000 MCG/ML injection 1 ml IM daily x 7 days then 1 ml IM weekly x 4 weeks then 1 ml IM monthly x 1 year. 04/09/18   Narda Amber K, DO  ferrous sulfate 325 (65 FE) MG tablet Take by mouth.    [provider]  gabapentin (NEURONTIN) 300 MG capsule Take one capsule at bedtime. 11/26/17   [provider]  hydrochlorothiazide (HYDRODIURIL) 25 MG tablet Take by mouth. 11/26/17   [provider]  levocetirizine (XYZAL) 5 MG tablet Take by mouth. 11/26/17   [provider]  losartan (COZAAR) 50 MG tablet Take by mouth.    [provider]  MAGNESIUM PO Take by mouth.    [provider]  metFORMIN (GLUCOPHAGE) 1000 MG tablet Take 1,000 mg by mouth. bid    [provider]  mirtazapine (REMERON) 15 MG tablet TAKE 1 TABLET BY MOUTH EVERY NIGHT AT BEDTIME 04/25/18   [provider]  montelukast (SINGULAIR) 10 MG tablet Take by mouth.    [provider]  Omega-3 1000 MG CAPS Take by mouth.    [provider]  omega-3 acid ethyl esters (LOVAZA) 1 g capsule Take by mouth.    [provider]  pantoprazole (PROTONIX) 40 MG tablet Take by mouth.    [provider]  sertraline (ZOLOFT) 100 MG tablet Take by mouth. 02/18/18   [provider]    Physical Exam: Vitals:   08/08/19 0303 08/08/19 0345 08/08/19 0415 08/08/19 0445  BP: (!) 104/54 125/68 (!) 156/82 (!) 162/84  Pulse: (!) 58 65 85 84  Resp: 16 15 19 15   Temp: 97.9 F (36.6 C)     TempSrc: Axillary     SpO2: 100% 97% 91% 100%  Weight:      Height:        Constitutional: NAD, calm, comfortable Vitals:   08/08/19 0303 08/08/19 0345 08/08/19 0415 08/08/19 0445  BP: (!) 104/54 125/68 (!) 156/82 (!) 162/84  Pulse: (!) 58 65 85 84  Resp: 16 15 19 15   Temp: 97.9 F (36.6 C)     TempSrc: Axillary     SpO2: 100%  97% 91% 100%  Weight:      Height:       Eyes: PERRL, lids and conjunctivae normal ENMT: Mucous membranes are moist. Posterior pharynx clear of any exudate or lesions.Normal dentition.  Neck: normal, supple, no masses, no thyromegaly Respiratory: clear to auscultation bilaterally, no wheezing, no crackles. Normal respiratory effort. No accessory muscle use.  Cardiovascular: Regular rate and rhythm, no murmurs / rubs / gallops. No extremity edema. 2+ pedal pulses. No carotid bruits.  Abdomen: no tenderness, no masses palpated. No hepatosplenomegaly. Bowel sounds positive.  Musculoskeletal: no clubbing / cyanosis. No joint deformity upper and lower extremities. Good ROM, no contractures. Normal muscle tone.  Skin: no rashes, lesions, ulcers. No induration Neurologic: CN 2-12 grossly intact. Sensation intact, DTR normal. Strength 5/5 in all 4.  Psychiatric: Unable to evaluate.    Labs on Admission: I have personally reviewed following labs and imaging studies  CBC: Recent Labs  Lab 08/07/19 2330 08/08/19 0103 08/08/19 0419  WBC 6.4  --  6.3  NEUTROABS 5.6  --  4.8  HGB 8.8* 8.5* 9.4*  HCT 27.4* 25.0* 28.8*  MCV 102.6*  --  102.5*  PLT 144*  --  0000000*   Basic Metabolic Panel: Recent Labs  Lab 08/07/19 2330 08/08/19 0103 08/08/19 0419  NA 138 139 141  K 3.6 3.6 3.8  CL 100  --  105  CO2 25  --  23  GLUCOSE 190*  --  171*  BUN 38*  --  34*  CREATININE 1.96*  --  1.59*  CALCIUM 8.5*  --  7.6*   GFR: Estimated Creatinine Clearance: 25.7 mL/min (A) (by C-G formula based on SCr of 1.59 mg/dL (H)). Liver Function Tests: Recent Labs  Lab 08/07/19 2330 08/08/19 0419  AST 23 21  ALT 15 13  ALKPHOS 63 62  BILITOT 0.5 0.4  PROT 6.1* 5.8*  ALBUMIN 3.3* 3.0*   No results for input(s): LIPASE, AMYLASE in the last 168 hours. No results for input(s): AMMONIA in the last 168 hours. Coagulation Profile: No results for input(s): INR, PROTIME in the last 168 hours. Cardiac  Enzymes: No results for input(s): CKTOTAL, CKMB, CKMBINDEX, TROPONINI in the last 168 hours. BNP (last 3 results) No results for input(s): PROBNP in the last 8760 hours. HbA1C: Recent Labs    08/07/19 2330  HGBA1C 6.3*   CBG: Recent Labs  Lab 08/08/19 0347  GLUCAP 166*   Lipid Profile: Recent Labs    08/07/19 2330  TRIG 194*   Thyroid Function Tests: No results for input(s): TSH, T4TOTAL, FREET4, T3FREE, THYROIDAB in the last 72 hours. Anemia Panel: Recent Labs    08/07/19 2330 08/08/19 0419  FERRITIN 160 148   Urine analysis: No  results found for: COLORURINE, APPEARANCEUR, LABSPEC, [place], GLUCOSEU, HGBUR, BILIRUBINUR, KETONESUR, PROTEINUR, UROBILINOGEN, NITRITE, LEUKOCYTESUR  Radiological Exams on Admission: DG Chest Port 1 View  Result Date: 08/07/2019 CLINICAL DATA:  Fever. EXAM: PORTABLE CHEST 1 VIEW COMPARISON:  None. FINDINGS: Mildly increased lung markings are seen without evidence of acute infiltrate, pleural effusion or pneumothorax. The heart size and mediastinal contours are within normal limits. Multilevel degenerative changes are seen throughout the thoracic spine. IMPRESSION: No active disease. Electronically Signed   By: Virgina Norfolk M.D.   On: 08/07/2019 22:33    EKG: Independently reviewed.   Admission plan  Acute encephalopathy Patient lethargic responding some verbal interaction Probably secondary to hypoxemia, Covid Plan supplemental oxygen, n.p.o., evaluated by speech therapist  Acute respiratory failure with hypoxemia secondary to Covid pneumonia Plan supplemental oxygen keep saturation between 90 and 94% Patient with mild elevated CO2  Remdesivir on Decadron Patient with elevated procalcitonin Start empirically on antibiotics  Covid 19 pneumonia Remdesivir Decadron Follow-up inflammatory markers Resume inhalers, zinc, vitamin D3, vitamin C   Diabetes mellitus type 2 Insulin sliding scale, patient n.p.o.  History of  CVA Resume home medication If no improvement MRI rule out CVA  Chronic kidney disease stage III Follow BUN and creatinine avoid nephrotoxins gentle hydration  Anemia chronic Follow H&H transfuse if hemoglobin less than 7  Essential hypertension Hold blood pressure medication Hold diuretics   Assessment/Plan Active Problems:   Diabetic polyneuropathy associated with type 2 diabetes mellitus ([facility])   Vitamin B12 deficiency   Pneumonia due to COVID-19 virus   Acute respiratory failure with hypoxemia ([facility])   History of CVA (cerebrovascular accident)   Anemia   CKD (chronic kidney disease), stage III      DVT prophylaxis: Heparin subcu Code Status: Full code Family Communication: Need to contact family Disposition Plan: Back to nursing home Consults called: No Admission status: Full admission   Doryce Mcgregory G Lerry Cordrey MD [facility]  If 7PM-7AM, please contact night-coverage [email or web address]   08/08/2019, 7:54 AM

## 2019-08-08 NOTE — ED Notes (Signed)
[place], they have no documentation of any advanced directive, DNR, or Code status at all. Because [place] is considered independent, they do not have any medical files on their patients.

## 2019-08-08 NOTE — Plan of Care (Signed)
79 year old female admitted from a nursing home with history of stroke hypertension type 2 diabetes with Covid positive change in mental status lethargy and cough.  Patient was hypoxic on arrival placed on 2 L of oxygen. Patient tested Covid + 08/06/2019 I will continue Decadron and remdesivir. I will DC Rocephin and azithromycin as there is no evidence of bacterial pneumonia.  Chest x-ray is clear she does have chronic cough over 50 years. Her fever and hypoxia is likely from Covid pneumonia. Her blood pressure is soft 109 48 continue IV fluids.  Her appetite is poor with decreased p.o. intake.  Encourage her to take p.o. She has unsteady gait and dizzy when standing.  PT evaluation pending.

## 2019-08-09 LAB — CBC WITH DIFFERENTIAL/PLATELET
Abs Immature Granulocytes: 0.01 10*3/uL (ref 0.00–0.07)
Basophils Absolute: 0 10*3/uL (ref 0.0–0.1)
Basophils Relative: 0 %
Eosinophils Absolute: 0 10*3/uL (ref 0.0–0.5)
Eosinophils Relative: 1 %
HCT: 26.9 % — ABNORMAL LOW (ref 36.0–46.0)
Hemoglobin: 9 g/dL — ABNORMAL LOW (ref 12.0–15.0)
Immature Granulocytes: 0 %
Lymphocytes Relative: 33 %
Lymphs Abs: 1.3 10*3/uL (ref 0.7–4.0)
MCH: 33.7 pg (ref 26.0–34.0)
MCHC: 33.5 g/dL (ref 30.0–36.0)
MCV: 100.7 fL — ABNORMAL HIGH (ref 80.0–100.0)
Monocytes Absolute: 0.3 10*3/uL (ref 0.1–1.0)
Monocytes Relative: 8 %
Neutro Abs: 2.3 10*3/uL (ref 1.7–7.7)
Neutrophils Relative %: 58 %
Platelets: 143 10*3/uL — ABNORMAL LOW (ref 150–400)
RBC: 2.67 MIL/uL — ABNORMAL LOW (ref 3.87–5.11)
RDW: 12.8 % (ref 11.5–15.5)
WBC: 4 10*3/uL (ref 4.0–10.5)
nRBC: 0 % (ref 0.0–0.2)

## 2019-08-09 LAB — COMPREHENSIVE METABOLIC PANEL
ALT: 12 U/L (ref 0–44)
AST: 24 U/L (ref 15–41)
Albumin: 2.7 g/dL — ABNORMAL LOW (ref 3.5–5.0)
Alkaline Phosphatase: 55 U/L (ref 38–126)
Anion gap: 11 (ref 5–15)
BUN: 24 mg/dL — ABNORMAL HIGH (ref 8–23)
CO2: 25 mmol/L (ref 22–32)
Calcium: 8.3 mg/dL — ABNORMAL LOW (ref 8.9–10.3)
Chloride: 105 mmol/L (ref 98–111)
Creatinine, Ser: 1.03 mg/dL — ABNORMAL HIGH (ref 0.44–1.00)
GFR calc Af Amer: >60 mL/min (ref >60)
GFR calc non Af Amer: 52 mL/min — ABNORMAL LOW (ref >60)
Glucose, Bld: 93 mg/dL (ref 70–99)
Potassium: 3.6 mmol/L (ref 3.5–5.1)
Sodium: 141 mmol/L (ref 135–145)
Total Bilirubin: 0.2 mg/dL — ABNORMAL LOW (ref 0.3–1.2)
Total Protein: 5.2 g/dL — ABNORMAL LOW (ref 6.5–8.1)

## 2019-08-09 LAB — GLUCOSE, CAPILLARY
Glucose-Capillary: 111 mg/dL — ABNORMAL HIGH (ref 70–99)
Glucose-Capillary: 165 mg/dL — ABNORMAL HIGH (ref 70–99)
Glucose-Capillary: 170 mg/dL — ABNORMAL HIGH (ref 70–99)
Glucose-Capillary: 194 mg/dL — ABNORMAL HIGH (ref 70–99)
Glucose-Capillary: 230 mg/dL — ABNORMAL HIGH (ref 70–99)
Glucose-Capillary: 87 mg/dL (ref 70–99)
Glucose-Capillary: 93 mg/dL (ref 70–99)

## 2019-08-09 LAB — D-DIMER, QUANTITATIVE: D-Dimer, Quant: 0.94 ug/mL-FEU — ABNORMAL HIGH (ref 0.00–0.50)

## 2019-08-09 MED ORDER — ALBUTEROL SULFATE HFA 108 (90 BASE) MCG/ACT IN AERS: 2.0000 | INHALATION_SPRAY | Freq: Four times a day (QID) | RESPIRATORY_TRACT | Status: DC | PRN

## 2019-08-09 NOTE — Plan of Care (Signed)

## 2019-08-09 NOTE — Progress Notes (Signed)
PROGRESS NOTE    Teresa Daniels  [id number] DOB: Oct 28, 1940 DOA: 08/07/2019 PCP: [name], PA-C   Brief Narrative: 79 year old female with history of stroke hypertension type 2 diabetes nursing home resident admitted with shortness of breath decreased appetite generalized weakness and diarrhea.  She was tested positive for Covid as an outpatient on 08/06/2019.  She is currently on 2 L of oxygen. Chest x-ray clear. She has a chronic cough over 50 years. 08/09/2019 patient sitting by the side of the bed, reports she is feeling better than yesterday.  Still on 2 L of oxygen.   Assessment & Plan:   Active Problems:   Diabetic polyneuropathy associated with type 2 diabetes mellitus (HCC)   Vitamin B12 deficiency   COVID-19   Acute respiratory failure with hypoxemia (HCC)   History of CVA (cerebrovascular accident)   Anemia   CKD (chronic kidney disease), stage III   Dehydration  #1 Covid pneumonia continue and finish the course of remdesivir and Decadron.  Inflammatory markers are trending down. Continue vitamin D3 zinc and inhalers.  Continue vitamin C.  #2 acute hypoxic respiratory failure secondary to Covid pneumonia titrate to keep saturation above 92% currently on 2 L will bring it down to 1 L and watch her closely.  #3 type 2 diabetes  #4 history of stroke  #5 history of CKD stage III stable  #6 anemia of chronic disease  #7 essential hypertension     Estimated body mass index is 23.66 kg/m as calculated from the following:   Height as of this encounter: 5' 4.5" (1.638 m).   Weight as of this encounter: 63.5 kg.  DVT prophylaxis: Subcu heparin Code Status: Full code Family Communication discussed with daughter  disposition Plan: Pending clinical improvement patient came from independent living await PT consult still O2 dependent Consultants:   none  Procedures:none Antimicrobials:none Subjective: Sitting by the side of the bed no new  complaints  Objective: Vitals:   08/08/19 2000 08/08/19 2038 08/09/19 0932 08/09/19 1136  BP: (!) 153/78  138/68   Pulse: 73 73 80   Resp:  16  17  Temp: 98.2 F (36.8 C)   98 F (36.7 C)  TempSrc: Oral   Oral  SpO2: 97% 98% 97%   Weight:      Height:        Intake/Output Summary (Last 24 hours) at 08/09/2019 1407 Last data filed at 08/09/2019 1225 Gross per 24 hour  Intake 1890.82 ml  Output 1251 ml  Net 639.82 ml   Filed Weights   08/07/19 2140  Weight: 63.5 kg    Examination:  General exam: Appears calm and comfortable  Respiratory system few scattered rhonchi to auscultation. Respiratory effort normal. Cardiovascular system: S1 & S2 heard, RRR. No JVD, murmurs, rubs, gallops or clicks. No pedal edema. Gastrointestinal system: Abdomen is nondistended, soft and nontender. No organomegaly or masses felt. Normal bowel sounds heard. Central nervous system: Alert and oriented. No focal neurological deficits. Extremities: Symmetric 5 x 5 power. Skin: No rashes, lesions or ulcers Psychiatry: Judgement and insight appear normal. Mood & affect appropriate.     Data Reviewed: I have personally reviewed following labs and imaging studies  CBC: Recent Labs  Lab 08/07/19 2330 08/08/19 0103 08/08/19 0419 08/09/19 0619  WBC 6.4  --  6.3 4.0  NEUTROABS 5.6  --  4.8 2.3  HGB 8.8* 8.5* 9.4* 9.0*  HCT 27.4* 25.0* 28.8* 26.9*  MCV 102.6*  --  102.5* 100.7*  PLT 144*  --  138* [id number]*   Basic Metabolic Panel: Recent Labs  Lab 08/07/19 2330 08/08/19 0103 08/08/19 0419 08/09/19 0619  NA 138 139 141 141  K 3.6 3.6 3.8 3.6  CL 100  --  105 105  CO2 25  --  23 25  GLUCOSE 190*  --  171* 93  BUN 38*  --  34* 24*  CREATININE 1.96*  --  1.59* 1.03*  CALCIUM 8.5*  --  7.6* 8.3*   GFR: Estimated Creatinine Clearance: 39.7 mL/min (A) (by C-G formula based on SCr of 1.03 mg/dL (H)). Liver Function Tests: Recent Labs  Lab 08/07/19 2330 08/08/19 0419 08/09/19 0619  AST 23  21 24   ALT 15 13 12   ALKPHOS 63 62 55  BILITOT 0.5 0.4 0.2*  PROT 6.1* 5.8* 5.2*  ALBUMIN 3.3* 3.0* 2.7*   No results for input(s): LIPASE, AMYLASE in the last 168 hours. No results for input(s): AMMONIA in the last 168 hours. Coagulation Profile: No results for input(s): INR, PROTIME in the last 168 hours. Cardiac Enzymes: No results for input(s): CKTOTAL, CKMB, CKMBINDEX, TROPONINI in the last 168 hours. BNP (last 3 results) No results for input(s): PROBNP in the last 8760 hours. HbA1C: Recent Labs    08/07/19 2330  HGBA1C 6.3*   CBG: Recent Labs  Lab 08/08/19 1947 08/08/19 2341 08/09/19 0345 08/09/19 0833 08/09/19 1215  GLUCAP 155* 89 87 93 165*   Lipid Profile: Recent Labs    08/07/19 2330  TRIG 194*   Thyroid Function Tests: No results for input(s): TSH, T4TOTAL, FREET4, T3FREE, THYROIDAB in the last 72 hours. Anemia Panel: Recent Labs    08/07/19 2330 08/08/19 0419  FERRITIN 160 148   Sepsis Labs: Recent Labs  Lab 08/07/19 2330 08/08/19 0225  PROCALCITON 1.59  --   LATICACIDVEN 2.3* 1.1    Recent Results (from the past 240 hour(s))  Blood Culture (routine x 2)     Status: None (Preliminary result)   Collection Time: 08/07/19 12:30 AM   Specimen: BLOOD  Result Value Ref Range Status   Specimen Description BLOOD RIGHT HAND  Final   Special Requests   Final    BOTTLES DRAWN AEROBIC AND ANAEROBIC Blood Culture adequate volume   Culture   Final    NO GROWTH < 12 HOURS Performed at [facility], [place] [address] [place], [place] [address]    Report Status PENDING  Incomplete  Blood Culture (routine x 2)     Status: None (Preliminary result)   Collection Time: 08/07/19 11:35 PM   Specimen: BLOOD  Result Value Ref Range Status   Specimen Description BLOOD RIGHT FOREARM  Final   Special Requests   Final    BOTTLES DRAWN AEROBIC AND ANAEROBIC Blood Culture adequate volume   Culture   Final    NO GROWTH < 12 HOURS Performed at [facility], [place] [address], [place], [place] [address]    Report Status PENDING  Incomplete         Radiology Studies: DG Chest Port 1 View  Result Date: 08/07/2019 CLINICAL DATA:  Fever. EXAM: PORTABLE CHEST 1 VIEW COMPARISON:  None. FINDINGS: Mildly increased lung markings are seen without evidence of acute infiltrate, pleural effusion or pneumothorax. The heart size and mediastinal contours are within normal limits. Multilevel degenerative changes are seen throughout the thoracic spine. IMPRESSION: No active disease. Electronically Signed   By: Virgina Norfolk M.D.   On: 08/07/2019 22:33  Scheduled Meds: . vitamin C  500 mg Oral Daily  . atorvastatin  40 mg Oral q1800  . cholecalciferol  1,000 Units Oral Daily  . clopidogrel  75 mg Oral Daily  . dexamethasone (DECADRON) injection  6 mg Intravenous Q24H  . ferrous sulfate  325 mg Oral Q breakfast  . heparin  5,000 Units Subcutaneous Q8H  . insulin aspart  0-9 Units Subcutaneous Q4H  . montelukast  10 mg Oral QHS  . omega-3 acid ethyl esters  1 g Oral Daily  . pantoprazole  40 mg Oral Daily  . sodium chloride flush  3 mL Intravenous Q12H  . zinc sulfate  220 mg Oral Daily   Continuous Infusions: . sodium chloride    . sodium chloride 100 mL/hr at 08/09/19 0839  . remdesivir 100 mg in NS 100 mL 100 mg (08/09/19 1100)     LOS: 1 day     Georgette Shell, MD [facility] If 7PM-7AM, please contact night-coverage [email or web address] Password [email or web address] 08/09/2019, 2:07 PM

## 2019-08-09 NOTE — Evaluation (Signed)
Physical Therapy Evaluation Patient Details Name: Teresa Daniels MRN: [id number] DOB: 18-Feb-1941 Today's Date: 08/09/2019   History of Present Illness  79 y.o. female with medical history significant of hypertension, CVA, diabetes mellitus type 2, and asthma. Admitted for covid.    Clinical Impression  Pt admitted with above diagnosis. PTA pt resided at [facility]. She was modified independent utilized a rollator. On eval, she required supervision bed mobility, min guard assist transfers and min guard assist ambulation 50' with RW. Pt mobilized on RA with SpO2 > 90%. At end of session, SpO2 97% at rest in recliner. Pt currently with functional limitations due to the deficits listed below (see PT Problem List). Pt will benefit from skilled PT to increase their independence and safety with mobility to allow discharge to the venue listed below.       Follow Up Recommendations Home health PT;Supervision - Intermittent    Equipment Recommendations  None recommended by PT    Recommendations for Other Services       Precautions / Restrictions Precautions Precautions: Fall      Mobility  Bed Mobility Overal bed mobility: Needs Assistance Bed Mobility: Supine to Sit     Supine to sit: HOB elevated;Supervision     General bed mobility comments: +rail, supervision for safety, no physical assist  Transfers Overall transfer level: Needs assistance Equipment used: Rolling walker (2 wheeled) Transfers: Sit to/from [facility] Sit to Stand: Min guard Stand pivot transfers: Min guard       General transfer comment: min guard for safety  Ambulation/Gait Ambulation/Gait assistance: Min guard Gait Distance (Feet): 50 Feet Assistive device: Rolling walker (2 wheeled) Gait Pattern/deviations: Step-through pattern;Decreased stride length Gait velocity: decreased Gait velocity interpretation: <1.31 ft/sec, indicative of household ambulator General Gait Details:  steady gait with RW. Ambulated on RA with SpO2 > 90%.  Stairs            Wheelchair Mobility    Modified Rankin (Stroke Patients Only)       Balance Overall balance assessment: Needs assistance Sitting-balance support: No upper extremity supported;Feet supported Sitting balance-Leahy Scale: Good     Standing balance support: Bilateral upper extremity supported;During functional activity Standing balance-Leahy Scale: Poor Standing balance comment: reliant on RW                             Pertinent Vitals/Pain Pain Assessment: No/denies pain    Home Living Family/patient expects to be discharged to:: Private residence([place]) Living Arrangements: Alone Available Help at Discharge: Family;Available PRN/intermittently Type of Home: Independent living facility Home Access: Level entry     Home Layout: One level Home Equipment: Walker - 4 wheels;Shower seat;Grab bars - tub/shower;Grab bars - toilet;Hand held shower head Additional Comments: resident of [facility]    Prior Function Level of Independence: Independent with assistive device(s)         Comments: amb with rollator     Hand Dominance        Extremity/Trunk Assessment   Upper Extremity Assessment Upper Extremity Assessment: Overall WFL for tasks assessed    Lower Extremity Assessment Lower Extremity Assessment: Generalized weakness    Cervical / Trunk Assessment Cervical / Trunk Assessment: Kyphotic  Communication   Communication: No difficulties  Cognition Arousal/Alertness: Awake/alert Behavior During Therapy: WFL for tasks assessed/performed Overall Cognitive Status: Within Functional Limits for tasks assessed  General Comments General comments (skin integrity, edema, etc.): Pt on 1L O2 on arrival with SpO2 93%. Amb on RA with SpO2 > 90%. At end of session, SpO2 97% at rest on RA.    Exercises      Assessment/Plan    PT Assessment Patient needs continued PT services  PT Problem List Decreased strength;Decreased mobility;Decreased activity tolerance;Decreased balance;Cardiopulmonary status limiting activity       PT Treatment Interventions Therapeutic activities;Gait training;Therapeutic exercise;Patient/family education;Balance training;Functional mobility training    PT Goals (Current goals can be found in the Care Plan section)  Acute Rehab PT Goals Patient Stated Goal: home PT Goal Formulation: With patient Time For Goal Achievement: 08/23/19 Potential to Achieve Goals: Good    Frequency Min 3X/week   Barriers to discharge        Co-evaluation               AM-PAC PT "6 Clicks" Mobility  Outcome Measure Help needed turning from your back to your side while in a flat bed without using bedrails?: None Help needed moving from lying on your back to sitting on the side of a flat bed without using bedrails?: A Little Help needed moving to and from a bed to a chair (including a wheelchair)?: A Little Help needed standing up from a chair using your arms (e.g., wheelchair or bedside chair)?: A Little Help needed to walk in hospital room?: A Little Help needed climbing 3-5 steps with a railing? : A Lot 6 Click Score: 18    End of Session   Activity Tolerance: Patient tolerated treatment well Patient left: in chair;with call bell/phone within reach Nurse Communication: Mobility status PT Visit Diagnosis: Muscle weakness (generalized) (M62.81);Difficulty in walking, not elsewhere classified (R26.2)    Time: [id number] PT Time Calculation (min) (ACUTE ONLY): 28 min   Charges:   PT Evaluation $PT Eval Moderate Complexity: 1 Mod PT Treatments $Gait Training: 8-22 mins        Lorrin Goodell, PT  Office # [phone] Pager [phone]   Lorriane Shire 08/09/2019, 10:25 AM

## 2019-08-10 LAB — SARS CORONAVIRUS 2 (TAT 6-24 HRS): SARS Coronavirus 2: POSITIVE — AB

## 2019-08-10 LAB — GLUCOSE, CAPILLARY
Glucose-Capillary: 100 mg/dL — ABNORMAL HIGH (ref 70–99)
Glucose-Capillary: 90 mg/dL (ref 70–99)

## 2019-08-10 LAB — D-DIMER, QUANTITATIVE: D-Dimer, Quant: 0.9 ug/mL-FEU — ABNORMAL HIGH (ref 0.00–0.50)

## 2019-08-10 MED ORDER — ZINC SULFATE 220 (50 ZN) MG PO CAPS: 220.0000 mg | ORAL_CAPSULE | Freq: Every day | ORAL | Status: DC

## 2019-08-10 MED ORDER — DEXAMETHASONE 6 MG PO TABS: 6.0000 mg | ORAL_TABLET | Freq: Every day | ORAL | 0 refills | Status: DC

## 2019-08-10 NOTE — Discharge Instructions (Signed)
You are scheduled for an outpatient infusion of Remdesivir at 1130AM on Monday 2/1 and Tuesday 2/2.  Please report to [facility] at [address].  Drive to the security guard and tell them you are here for an infusion. They will direct you to the front entrance where we will come and get you.  For questions call [phone].  Thanks

## 2019-08-10 NOTE — TOC Transition Note (Signed)
Transition of Care [facility]) - CM/SW Discharge Note   Patient Details  Name: Teresa Daniels MRN: [id number] Date of Birth: 1941/06/07  Transition of Care [facility]) CM/SW Contact:  Carles Collet, RN Phone Number: 08/10/2019, 11:35 AM   Clinical Narrative:    Patient deferred to daughter. Spoke w Santiago Glad, discussed [facility] providers, requested [facility], referral accepted.     Final next level of care: [place] Barriers to Discharge: No Barriers Identified   Patient Goals and CMS Choice Patient states their goals for this hospitalization and ongoing recovery are:: to go home CMS Medicare.gov Compare Post Acute Care list provided to:: Other (Comment Required) Choice offered to / list presented to : Adult Children  Discharge Placement                       Discharge Plan and Services                          HH Arranged: RN, PT, Nurse's Aide [place] Agency: [place] Date [facility] Agency Contacted: 08/10/19 Time [place]: 1134 Representative spoke with at [place]: [place] ([place]) Interventions     Readmission Risk Interventions No flowsheet data found.

## 2019-08-10 NOTE — Discharge Summary (Signed)
Physician Discharge Summary  Teresa Daniels [id number] DOB: 1940-07-16 DOA: 08/07/2019  PCP: [name], PA-C  Admit date: 08/07/2019 Discharge date: 08/10/2019  Admitted From: Assisted living Disposition: Assisted living Recommendations for Outpatient Follow-up:  1. Follow up with PCP in 1-2 weeks 2. Please obtain BMP/CBC in one week   Home Health home health PT Equipment/Devices: None  Discharge Condition stable and improved CODE STATUS full code Diet recommendation: Cardiac diet Brief/Interim Summary:79 year old female with history of stroke hypertension type 2 diabetes nursing home resident admitted with shortness of breath decreased appetite generalized weakness and diarrhea.  She was tested positive for Covid as an outpatient on 08/06/2019.  She is currently on 2 L of oxygen. Chest x-ray clear. She has a chronic cough over 50 years. 08/09/2019 patient sitting by the side of the bed, reports she is feeling better than yesterday.  Still on 2 L of oxygen.   Discharge Diagnoses:  Active Problems:   Diabetic polyneuropathy associated with type 2 diabetes mellitus (HCC)   Vitamin B12 deficiency   COVID-19   Acute respiratory failure with hypoxemia (HCC)   History of CVA (cerebrovascular accident)   Anemia   CKD (chronic kidney disease), stage III   Dehydration   #1 Covid pneumonia -patient saturating on room air above 92%.  Will discharge patient today on remdesivir infusion to complete at [facility] and Decadron for another 7 days.  We will continue vitamin C D3 and zinc.  Inflammatory markers are trending down.    #2 acute hypoxic respiratory failure secondary to Covid pneumonia resolved.    #3 type 2 diabetes-continue Metformin.  #4 history of stroke continue aspirin.  #5 history of CKD stage III stable  #6 anemia of chronic disease hemoglobin stable.  #7 essential hypertension continue HCTZ and ARB.   Estimated body mass index is 23.66 kg/m as calculated from  the following:   Height as of this encounter: 5' 4.5" (1.638 m).   Weight as of this encounter: 63.5 kg.  Discharge Instructions   Allergies as of 08/10/2019   No Known Allergies     Medication List    TAKE these medications   acetaminophen 500 MG tablet Commonly known as: TYLENOL Take 1,000 mg by mouth at bedtime.   albuterol 108 (90 Base) MCG/ACT inhaler Commonly known as: VENTOLIN HFA Inhale 2 puffs into the lungs every 6 (six) hours as needed for wheezing.   atorvastatin 40 MG tablet Commonly known as: LIPITOR Take 40 mg by mouth every evening.   cetirizine 5 MG tablet Commonly known as: ZYRTEC Take 5 mg by mouth daily at 12 noon.   cholecalciferol 1000 units tablet Commonly known as: VITAMIN D Take 1,000 Units by mouth daily.   clopidogrel 75 MG tablet Commonly known as: PLAVIX Take 75 mg by mouth daily.   [employer] B12 Quick Dissolve 500 MCG Lozg Generic drug: Cyanocobalamin Take 500 mcg by mouth daily. What changed: Another medication with the same name was removed. Continue taking this medication, and follow the directions you see here.   dexamethasone 6 MG tablet Commonly known as: DECADRON Take 1 tablet (6 mg total) by mouth daily.   ferrous sulfate 325 (65 FE) MG tablet Take 325 mg by mouth 2 (two) times daily with a meal.   hydrochlorothiazide 25 MG tablet Commonly known as: HYDRODIURIL Take 25 mg by mouth daily.   losartan 50 MG tablet Commonly known as: COZAAR Take 100 mg by mouth daily.   MAGNESIUM PO Take 250 mg by mouth  daily.   Melatonin 10 MG Tabs Take 10 mg by mouth every evening.   metFORMIN 1000 MG tablet Commonly known as: GLUCOPHAGE Take 1,000 mg by mouth 2 (two) times daily with a meal. Breakfast and supper   mirtazapine 15 MG tablet Commonly known as: REMERON Take 15 mg by mouth every evening.   montelukast 10 MG tablet Commonly known as: SINGULAIR Take 10 mg by mouth daily at 12 noon.   Neurontin 300 MG capsule Generic  drug: gabapentin Take 300 mg by mouth at bedtime.   Omega-3 1000 MG Caps Take 1,000 mg by mouth daily.   pantoprazole 40 MG tablet Commonly known as: PROTONIX Take 40 mg by mouth daily.   Qvar 40 MCG/ACT inhaler Generic drug: beclomethasone Inhale 1 puff into the lungs 2 (two) times daily.   sertraline 100 MG tablet Commonly known as: ZOLOFT Take 100 mg by mouth daily.   VITAMIN C PO Take 500 mg by mouth daily.   zinc sulfate 220 (50 Zn) MG capsule Take 1 capsule (220 mg total) by mouth daily. Start taking on: August 11, 2019      Follow-up Information    [name], PA-C Follow up.   Specialty: Physician Assistant Contact information: [place] [id number] [phone]          No Known Allergies  Consultations: None  Procedures/Studies: DG Chest Port 1 View  Result Date: 08/07/2019 CLINICAL DATA:  Fever. EXAM: PORTABLE CHEST 1 VIEW COMPARISON:  None. FINDINGS: Mildly increased lung markings are seen without evidence of acute infiltrate, pleural effusion or pneumothorax. The heart size and mediastinal contours are within normal limits. Multilevel degenerative changes are seen throughout the thoracic spine. IMPRESSION: No active disease. Electronically Signed   By: Virgina Norfolk M.D.   On: 08/07/2019 22:33    (Echo, Carotid, EGD, Colonoscopy, ERCP)    Subjective: Resting in bed anxious to be discharged no new complaints no shortness of breath  Discharge Exam: Vitals:   08/09/19 2200 08/10/19 0820  BP: (!) 157/68 (!) 168/85  Pulse: 67 84  Resp: 18 18  Temp: 97.8 F (36.6 C) 97.6 F (36.4 C)  SpO2: 97% 96%   Vitals:   08/09/19 1136 08/09/19 1524 08/09/19 2200 08/10/19 0820  BP:  133/85 (!) 157/68 (!) 168/85  Pulse:  77 67 84  Resp: 17 18 18 18   Temp: 98 F (36.7 C) 97.8 F (36.6 C) 97.8 F (36.6 C) 97.6 F (36.4 C)  TempSrc: Oral Oral Oral Oral  SpO2:  98% 97% 96%  Weight:      Height:        General: Pt is  alert, awake, not in acute distress Cardiovascular: RRR, S1/S2 +, no rubs, no gallops Respiratory: CTA bilaterally, no wheezing, no rhonchi Abdominal: Soft, NT, ND, bowel sounds + Extremities: no edema, no cyanosis    The results of significant diagnostics from this hospitalization (including imaging, microbiology, ancillary and laboratory) are listed below for reference.     Microbiology: Recent Results (from the past 240 hour(s))  Blood Culture (routine x 2)     Status: None (Preliminary result)   Collection Time: 08/07/19 12:30 AM   Specimen: BLOOD  Result Value Ref Range Status   Specimen Description BLOOD RIGHT HAND  Final   Special Requests   Final    BOTTLES DRAWN AEROBIC AND ANAEROBIC Blood Culture adequate volume   Culture   Final    NO GROWTH 1 DAY Performed at [facility], [place] [address], [place], [place] [address]    Report Status PENDING  Incomplete  Blood Culture (routine x 2)     Status: None (Preliminary result)   Collection Time: 08/07/19 11:35 PM   Specimen: BLOOD  Result Value Ref Range Status   Specimen Description BLOOD RIGHT FOREARM  Final   Special Requests   Final    BOTTLES DRAWN AEROBIC AND ANAEROBIC Blood Culture adequate volume   Culture   Final    NO GROWTH 1 DAY Performed at [facility], [place] [address] [place], [place] [address]    Report Status PENDING  Incomplete  SARS CORONAVIRUS 2 (TAT 6-24 HRS) Nasopharyngeal Nasopharyngeal Swab     Status: Abnormal   Collection Time: 08/09/19  5:32 PM   Specimen: Nasopharyngeal Swab  Result Value Ref Range Status   SARS Coronavirus 2 POSITIVE (A) NEGATIVE Final    Comment: RESULT CALLED TO, READ BACK BY AND VERIFIED WITH: D. CRANDELL,RN 0127 08/10/2019 T. TYSOR (NOTE) SARS-CoV-2 target nucleic acids are DETECTED. The SARS-CoV-2 RNA is generally detectable in upper and lower respiratory specimens during the acute phase of infection. Positive results are indicative of the presence of  SARS-CoV-2 RNA. Clinical correlation with patient history and other diagnostic information is  necessary to determine patient infection status. Positive results do not rule out bacterial infection or co-infection with other viruses.  The expected result is Negative. Fact Sheet for Patients: SugarRoll.be Fact Sheet for Healthcare Providers: https://www.woods-Shamell Hittle.com/ This test is not yet approved or cleared by the [place] FDA and  has been authorized for detection and/or diagnosis of SARS-CoV-2 by FDA under an Emergency Use Authorization (EUA). This EUA will remain  in effect (meaning this test can be used) for  the duration of the COVID-19 declaration under Section 564(b)(1) of the Act, 21 U.S.C. section 360bbb-3(b)(1), unless the authorization is terminated or revoked sooner. Performed at [facility], [place] [address] [place], [place] [address]      Labs: BNP (last 3 results) No results for input(s): BNP in the last 8760 hours. Basic Metabolic Panel: Recent Labs  Lab 08/07/19 2330 08/08/19 0103 08/08/19 0419 08/09/19 0619  NA 138 139 141 141  K 3.6 3.6 3.8 3.6  CL 100  --  105 105  CO2 25  --  23 25  GLUCOSE 190*  --  171* 93  BUN 38*  --  34* 24*  CREATININE 1.96*  --  1.59* 1.03*  CALCIUM 8.5*  --  7.6* 8.3*   Liver Function Tests: Recent Labs  Lab 08/07/19 2330 08/08/19 0419 08/09/19 0619  AST 23 21 24   ALT 15 13 12   ALKPHOS 63 62 55  BILITOT 0.5 0.4 0.2*  PROT 6.1* 5.8* 5.2*  ALBUMIN 3.3* 3.0* 2.7*   No results for input(s): LIPASE, AMYLASE in the last 168 hours. No results for input(s): AMMONIA in the last 168 hours. CBC: Recent Labs  Lab 08/07/19 2330 08/08/19 0103 08/08/19 0419 08/09/19 0619  WBC 6.4  --  6.3 4.0  NEUTROABS 5.6  --  4.8 2.3  HGB 8.8* 8.5* 9.4* 9.0*  HCT 27.4* 25.0* 28.8* 26.9*  MCV 102.6*  --  102.5* 100.7*  PLT 144*  --  138* 143*   Cardiac Enzymes: No results for  input(s): CKTOTAL, CKMB, CKMBINDEX, TROPONINI in the last 168 hours. BNP: Invalid input(s): POCBNP CBG: Recent Labs  Lab 08/09/19 1750 08/09/19 2016 08/09/19 2351 08/10/19 0339 08/10/19 0814  GLUCAP 194*  170* 111* 100* 90   D-Dimer Recent Labs    08/09/19 0619 08/10/19 0447  DDIMER 0.94* 0.90*   Hgb A1c Recent Labs    08/07/19 2330  HGBA1C 6.3*   Lipid Profile Recent Labs    08/07/19 2330  TRIG 194*   Thyroid function studies No results for input(s): TSH, T4TOTAL, T3FREE, THYROIDAB in the last 72 hours.  Invalid input(s): FREET3 Anemia work up [employer]    08/07/19 2330 08/08/19 0419  FERRITIN 160 148   Urinalysis No results found for: COLORURINE, APPEARANCEUR, LABSPEC, [place], GLUCOSEU, [place], BILIRUBINUR, KETONESUR, PROTEINUR, UROBILINOGEN, NITRITE, LEUKOCYTESUR Sepsis Labs Invalid input(s): PROCALCITONIN,  WBC,  LACTICIDVEN Microbiology Recent Results (from the past 240 hour(s))  Blood Culture (routine x 2)     Status: None (Preliminary result)   Collection Time: 08/07/19 12:30 AM   Specimen: BLOOD  Result Value Ref Range Status   Specimen Description BLOOD RIGHT HAND  Final   Special Requests   Final    BOTTLES DRAWN AEROBIC AND ANAEROBIC Blood Culture adequate volume   Culture   Final    NO GROWTH 1 DAY Performed at [facility], [place] [address], [place], [place] [address]    Report Status PENDING  Incomplete  Blood Culture (routine x 2)     Status: None (Preliminary result)   Collection Time: 08/07/19 11:35 PM   Specimen: BLOOD  Result Value Ref Range Status   Specimen Description BLOOD RIGHT FOREARM  Final   Special Requests   Final    BOTTLES DRAWN AEROBIC AND ANAEROBIC Blood Culture adequate volume   Culture   Final    NO GROWTH 1 DAY Performed at [facility], [place] [address], [place], [place] [address]    Report Status PENDING  Incomplete  SARS CORONAVIRUS 2 (TAT 6-24 HRS) Nasopharyngeal Nasopharyngeal Swab     Status:  Abnormal   Collection Time: 08/09/19  5:32 PM   Specimen: Nasopharyngeal Swab  Result Value Ref Range Status   SARS Coronavirus 2 POSITIVE (A) NEGATIVE Final    Comment: RESULT CALLED TO, READ BACK BY AND VERIFIED WITH: D. CRANDELL,RN 0127 08/10/2019 T. TYSOR (NOTE) SARS-CoV-2 target nucleic acids are DETECTED. The SARS-CoV-2 RNA is generally detectable in upper and lower respiratory specimens during the acute phase of infection. Positive results are indicative of the presence of SARS-CoV-2 RNA. Clinical correlation with patient history and other diagnostic information is  necessary to determine patient infection status. Positive results do not rule out bacterial infection or co-infection with other viruses.  The expected result is Negative. Fact Sheet for Patients: SugarRoll.be Fact Sheet for Healthcare Providers: https://www.woods-Lacey Wallman.com/ This test is not yet approved or cleared by the [place] FDA and  has been authorized for detection and/or diagnosis of SARS-CoV-2 by FDA under an Emergency Use Authorization (EUA). This EUA will remain  in effect (meaning this test can be used) for  the duration of the COVID-19 declaration under Section 564(b)(1) of the Act, 21 U.S.C. section 360bbb-3(b)(1), unless the authorization is terminated or revoked sooner. Performed at [facility], [place] [address], [place], [place] [address]      Time coordinating discharge:  38 minutes  SIGNED:   Georgette Shell, MD  [facility] 08/10/2019, 10:25 AM Pager   If 7PM-7AM, please contact night-coverage [email or web address] Password [email or web address]

## 2019-08-10 NOTE — Progress Notes (Signed)
Patient scheduled for outpatient Remdesivir infusion at 1130AM on Monday 2/1 and Tuesday 2/2.  Please advise them to report to [facility] at [address].  Drive to the security guard and tell them you are here for an infusion. They will direct you to the front entrance where we will come and get you.  For questions call [phone].  Thanks

## 2019-08-10 NOTE — Progress Notes (Signed)
PROGRESS NOTE    Teresa Daniels  [id number] DOB: [birth year] DOA: 08/07/2019 PCP: [name], PA-C  Brief Narrative: 79 y.o. female with medical history significant of hypertension CVA diabetes mellitus type 2 asthma, nursing home resident came with a chief complaint shortness of breath, general weakness, change in mental status lethargy for the last few days, she has some diarrhea as well.  No loss of taste or smell. Daughter called just after patient arrival stating that COVID-19 test came back positive.  ED Course:  In the emergency room she was very lethargic, Hypoxic started on 2 L of oxygen ABG was done hypoxemia with mild elevated CO2 Chest x-ray no pneumonia May need CT chest Procalcitonin elevated Inflammatory marker for Covid mildly elevated   Assessment & Plan:   Active Problems:   Diabetic polyneuropathy associated with type 2 diabetes mellitus (HCC)   Vitamin B12 deficiency   COVID-19   Acute respiratory failure with hypoxemia (HCC)   History of CVA (cerebrovascular accident)   Anemia   CKD (chronic kidney disease), stage III   Dehydration  #1 COVID-19 pneumonia patient had made significant improvement on Decadron and remdesivir.  Inflammatory markers are trending down.  Continue inhalers sink vitamin D3 and zinc.  #2 acute hypoxic respiratory failure secondary to Covid pneumonia patient still on oxygen at 2 L.  Will titrate to keep her sats above 92%.  #3 type 2 diabetes on Metformin at home which is on hold at this time.  Blood sugar 171 likely secondary to Decadron.  #4 history of stroke continue aspirin.  #5 CKD stage III stable.  #6 anemia of chronic disease stable hemoglobin.  #7 history of essential hypertension continue HCTZ and Avapro.    Estimated body mass index is 23.66 kg/m as calculated from the following:   Height as of this encounter: 5' 4.5" (1.638 m).   Weight as of this encounter: 63.5 kg.  DVT prophylaxis: Subcu heparin Code  Status: Full code  family Communication: Discussed with daughter Disposition Plan: Pending clinical improvement discharged back to assisted living Consultants:   None  Procedures: None Antimicrobials: None Subjective: Resting in bed more awake and alert than yesterday reports feeling better  Objective: Vitals:   08/09/19 1136 08/09/19 1524 08/09/19 2200 08/10/19 0820  BP:  133/85 (!) 157/68 (!) 168/85  Pulse:  77 67 84  Resp: 17 18 18 18   Temp: 98 F (36.7 C) 97.8 F (36.6 C) 97.8 F (36.6 C) 97.6 F (36.4 C)  TempSrc: Oral Oral Oral Oral  SpO2:  98% 97% 96%  Weight:      Height:        Intake/Output Summary (Last 24 hours) at 08/10/2019 1028 Last data filed at 08/10/2019 0600 Gross per 24 hour  Intake 2253.95 ml  Output 1100 ml  Net 1153.95 ml   Filed Weights   08/07/19 2140  Weight: 63.5 kg    Examination:  General exam: Appears calm and comfortable  Respiratory system few scattered rhonchi otherwise clear to auscultation. Respiratory effort normal. Cardiovascular system: S1 & S2 heard, RRR. No JVD, murmurs, rubs, gallops or clicks. No pedal edema. Gastrointestinal system: Abdomen is nondistended, soft and nontender. No organomegaly or masses felt. Normal bowel sounds heard. Central nervous system: Alert and oriented. No focal neurological deficits. Extremities: Symmetric 5 x 5 power. Skin: No rashes, lesions or ulcers Psychiatry: Judgement and insight appear normal. Mood & affect appropriate.     Data Reviewed: I have personally reviewed following labs and imaging studies  CBC: Recent Labs  Lab 08/07/19 2330 08/08/19 0103 08/08/19 0419 08/09/19 0619  WBC 6.4  --  6.3 4.0  NEUTROABS 5.6  --  4.8 2.3  HGB 8.8* 8.5* 9.4* 9.0*  HCT 27.4* 25.0* 28.8* 26.9*  MCV 102.6*  --  102.5* 100.7*  PLT 144*  --  138* [id number]*   Basic Metabolic Panel: Recent Labs  Lab 08/07/19 2330 08/08/19 0103 08/08/19 0419 08/09/19 0619  NA 138 139 141 141  K 3.6 3.6 3.8  3.6  CL 100  --  105 105  CO2 25  --  23 25  GLUCOSE 190*  --  171* 93  BUN 38*  --  34* 24*  CREATININE 1.96*  --  1.59* 1.03*  CALCIUM 8.5*  --  7.6* 8.3*   GFR: Estimated Creatinine Clearance: 39.7 mL/min (A) (by C-G formula based on SCr of 1.03 mg/dL (H)). Liver Function Tests: Recent Labs  Lab 08/07/19 2330 08/08/19 0419 08/09/19 0619  AST 23 21 24   ALT 15 13 12   ALKPHOS 63 62 55  BILITOT 0.5 0.4 0.2*  PROT 6.1* 5.8* 5.2*  ALBUMIN 3.3* 3.0* 2.7*   No results for input(s): LIPASE, AMYLASE in the last 168 hours. No results for input(s): AMMONIA in the last 168 hours. Coagulation Profile: No results for input(s): INR, PROTIME in the last 168 hours. Cardiac Enzymes: No results for input(s): CKTOTAL, CKMB, CKMBINDEX, TROPONINI in the last 168 hours. BNP (last 3 results) No results for input(s): PROBNP in the last 8760 hours. HbA1C: Recent Labs    08/07/19 2330  HGBA1C 6.3*   CBG: Recent Labs  Lab 08/09/19 1750 08/09/19 2016 08/09/19 2351 08/10/19 0339 08/10/19 0814  GLUCAP 194* 170* 111* 100* 90   Lipid Profile: Recent Labs    08/07/19 2330  TRIG 194*   Thyroid Function Tests: No results for input(s): TSH, T4TOTAL, FREET4, T3FREE, THYROIDAB in the last 72 hours. Anemia Panel: Recent Labs    08/07/19 2330 08/08/19 0419  FERRITIN 160 148   Sepsis Labs: Recent Labs  Lab 08/07/19 2330 08/08/19 0225  PROCALCITON 1.59  --   LATICACIDVEN 2.3* 1.1    Recent Results (from the past 240 hour(s))  Blood Culture (routine x 2)     Status: None (Preliminary result)   Collection Time: 08/07/19 12:30 AM   Specimen: BLOOD  Result Value Ref Range Status   Specimen Description BLOOD RIGHT HAND  Final   Special Requests   Final    BOTTLES DRAWN AEROBIC AND ANAEROBIC Blood Culture adequate volume   Culture   Final    NO GROWTH 1 DAY Performed at [facility], [place] [address], [place], [place] [address]    Report Status PENDING  Incomplete  Blood  Culture (routine x 2)     Status: None (Preliminary result)   Collection Time: 08/07/19 11:35 PM   Specimen: BLOOD  Result Value Ref Range Status   Specimen Description BLOOD RIGHT FOREARM  Final   Special Requests   Final    BOTTLES DRAWN AEROBIC AND ANAEROBIC Blood Culture adequate volume   Culture   Final    NO GROWTH 1 DAY Performed at [facility], [place] [address], [place], [place] [address]    Report Status PENDING  Incomplete  SARS CORONAVIRUS 2 (TAT 6-24 HRS) Nasopharyngeal Nasopharyngeal Swab     Status: Abnormal   Collection Time: 08/09/19  5:32 PM   Specimen: Nasopharyngeal Swab  Result Value Ref Range Status  SARS Coronavirus 2 POSITIVE (A) NEGATIVE Final    Comment: RESULT CALLED TO, READ BACK BY AND VERIFIED WITH: D. CRANDELL,RN 0127 08/10/2019 T. TYSOR (NOTE) SARS-CoV-2 target nucleic acids are DETECTED. The SARS-CoV-2 RNA is generally detectable in upper and lower respiratory specimens during the acute phase of infection. Positive results are indicative of the presence of SARS-CoV-2 RNA. Clinical correlation with patient history and other diagnostic information is  necessary to determine patient infection status. Positive results do not rule out bacterial infection or co-infection with other viruses.  The expected result is Negative. Fact Sheet for Patients: SugarRoll.be Fact Sheet for Healthcare Providers: https://www.woods-Ahaan Zobrist.com/ This test is not yet approved or cleared by the [place] FDA and  has been authorized for detection and/or diagnosis of SARS-CoV-2 by FDA under an Emergency Use Authorization (EUA). This EUA will remain  in effect (meaning this test can be used) for  the duration of the COVID-19 declaration under Section 564(b)(1) of the Act, 21 U.S.C. section 360bbb-3(b)(1), unless the authorization is terminated or revoked sooner. Performed at [facility], [place] [address],  [place], [place] [address]          Radiology Studies: No results found.      Scheduled Meds: . vitamin C  500 mg Oral Daily  . atorvastatin  40 mg Oral q1800  . cholecalciferol  1,000 Units Oral Daily  . clopidogrel  75 mg Oral Daily  . dexamethasone (DECADRON) injection  6 mg Intravenous Q24H  . ferrous sulfate  325 mg Oral Q breakfast  . heparin  5,000 Units Subcutaneous Q8H  . insulin aspart  0-9 Units Subcutaneous Q4H  . montelukast  10 mg Oral QHS  . omega-3 acid ethyl esters  1 g Oral Daily  . pantoprazole  40 mg Oral Daily  . sodium chloride flush  3 mL Intravenous Q12H  . zinc sulfate  220 mg Oral Daily   Continuous Infusions: . sodium chloride    . sodium chloride 100 mL/hr at 08/10/19 0441  . remdesivir 100 mg in NS 100 mL 100 mg (08/10/19 1005)     LOS: 2 days     Georgette Shell, MD [facility]   If 7PM-7AM, please contact night-coverage [email or web address]  08/10/2019, 10:28 AM

## 2019-08-10 NOTE — Progress Notes (Signed)
Lab called and stated that her Covid test is positive.

## 2019-08-10 NOTE — Plan of Care (Signed)

## 2019-08-11 ENCOUNTER — Ambulatory Visit (HOSPITAL_COMMUNITY)
Admission: RE | Admit: 2019-08-11 | Discharge: 2019-08-11 | Disposition: A | Discharge status: Home / Self Care | Payer: Medicare PPO | Source: Ambulatory Visit | Attending: Pulmonary Disease | Admitting: Pulmonary Disease

## 2019-08-11 ENCOUNTER — Encounter (HOSPITAL_COMMUNITY): Payer: Self-pay

## 2019-08-11 VITALS — BP 103/74 | HR 65 | Temp 98.0°F | Resp 16

## 2019-08-11 DIAGNOSIS — U071 COVID-19: Secondary | ICD-10-CM

## 2019-08-11 MED ORDER — METHYLPREDNISOLONE SODIUM SUCC 125 MG IJ SOLR: 125.0000 mg | Freq: Once | INTRAMUSCULAR | Status: DC | PRN

## 2019-08-11 MED ORDER — EPINEPHRINE 0.3 MG/0.3ML IJ SOAJ: 0.3000 mg | Freq: Once | INTRAMUSCULAR | Status: DC | PRN

## 2019-08-11 MED ORDER — DIPHENHYDRAMINE HCL 50 MG/ML IJ SOLN: 50.0000 mg | Freq: Once | INTRAMUSCULAR | Status: DC | PRN

## 2019-08-11 MED ORDER — SODIUM CHLORIDE 0.9 % IV SOLN: INTRAVENOUS | Status: DC | PRN

## 2019-08-11 MED ORDER — ALBUTEROL SULFATE HFA 108 (90 BASE) MCG/ACT IN AERS: 2.0000 | INHALATION_SPRAY | Freq: Once | RESPIRATORY_TRACT | Status: DC | PRN

## 2019-08-11 MED ORDER — SODIUM CHLORIDE 0.9 % IV SOLN
100.0000 mg | Freq: Once | INTRAVENOUS | Status: AC
  Administered 2019-08-11: 12:00:00 100 mg via INTRAVENOUS

## 2019-08-11 MED ORDER — FAMOTIDINE IN NACL 20-0.9 MG/50ML-% IV SOLN: 20.0000 mg | Freq: Once | INTRAVENOUS | Status: DC | PRN

## 2019-08-11 MED ORDER — SODIUM CHLORIDE 0.9 % IV SOLN
INTRAVENOUS | Status: AC
  Filled 2019-08-11: qty 20

## 2019-08-11 NOTE — Progress Notes (Signed)
  Diagnosis: COVID-19  Physician: Dr. Wright  Procedure: Covid Infusion Clinic Med: remdesivir infusion.  Complications: No immediate complications noted.  Discharge: Discharged home   Teresa Daniels 08/11/2019  

## 2019-08-11 NOTE — Discharge Instructions (Signed)

## 2019-08-12 ENCOUNTER — Ambulatory Visit (HOSPITAL_COMMUNITY)
Admit: 2019-08-12 | Discharge: 2019-08-12 | Disposition: A | Discharge status: Home / Self Care | Payer: Medicare PPO | Attending: Pulmonary Disease | Admitting: Pulmonary Disease

## 2019-08-12 DIAGNOSIS — U071 COVID-19: Secondary | ICD-10-CM

## 2019-08-12 MED ORDER — SODIUM CHLORIDE 0.9 % IV SOLN: INTRAVENOUS | Status: DC | PRN

## 2019-08-12 MED ORDER — ALBUTEROL SULFATE HFA 108 (90 BASE) MCG/ACT IN AERS: 2.0000 | INHALATION_SPRAY | Freq: Once | RESPIRATORY_TRACT | Status: DC | PRN

## 2019-08-12 MED ORDER — DIPHENHYDRAMINE HCL 50 MG/ML IJ SOLN: 50.0000 mg | Freq: Once | INTRAMUSCULAR | Status: DC | PRN

## 2019-08-12 MED ORDER — SODIUM CHLORIDE 0.9 % IV SOLN
100.0000 mg | Freq: Once | INTRAVENOUS | Status: AC
  Administered 2019-08-12: 100 mg via INTRAVENOUS

## 2019-08-12 MED ORDER — EPINEPHRINE 0.3 MG/0.3ML IJ SOAJ: 0.3000 mg | Freq: Once | INTRAMUSCULAR | Status: DC | PRN

## 2019-08-12 MED ORDER — FAMOTIDINE IN NACL 20-0.9 MG/50ML-% IV SOLN: 20.0000 mg | Freq: Once | INTRAVENOUS | Status: DC | PRN

## 2019-08-12 MED ORDER — SODIUM CHLORIDE 0.9 % IV SOLN
INTRAVENOUS | Status: AC
  Filled 2019-08-12: qty 20

## 2019-08-12 MED ORDER — METHYLPREDNISOLONE SODIUM SUCC 125 MG IJ SOLR: 125.0000 mg | Freq: Once | INTRAMUSCULAR | Status: DC | PRN

## 2019-08-12 NOTE — Progress Notes (Signed)
  Diagnosis: COVID-19  Physician:Dr Joya Gaskins  Procedure: Covid Infusion Clinic Med: remdesivir infusion.  Complications: No immediate complications noted.  Discharge: Discharged home   Rolena Infante 08/12/2019

## 2019-08-12 NOTE — Discharge Instructions (Signed)
COVID-19 COVID-19 is a respiratory infection that is caused by a virus called severe acute respiratory syndrome coronavirus 2 (SARS-CoV-2). The disease is also known as coronavirus disease or novel coronavirus. In some people, the virus may not cause any symptoms. In others, it may cause a serious infection. The infection can get worse quickly and can lead to complications, such as:  Pneumonia, or infection of the lungs.  Acute respiratory distress syndrome or ARDS. This is a condition in which fluid build-up in the lungs prevents the lungs from filling with air and passing oxygen into the blood.  Acute respiratory failure. This is a condition in which there is not enough oxygen passing from the lungs to the body or when carbon dioxide is not passing from the lungs out of the body.  Sepsis or septic shock. This is a serious bodily reaction to an infection.  Blood clotting problems.  Secondary infections due to bacteria or fungus.  Organ failure. This is when your body's organs stop working. The virus that causes COVID-19 is contagious. This means that it can spread from person to person through droplets from coughs and sneezes (respiratory secretions). What are the causes? This illness is caused by a virus. You may catch the virus by:  Breathing in droplets from an infected person. Droplets can be spread by a person breathing, speaking, singing, coughing, or sneezing.  Touching something, like a table or a doorknob, that was exposed to the virus (contaminated) and then touching your mouth, nose, or eyes. What increases the risk? Risk for infection You are more likely to be infected with this virus if you:  Are within 6 feet (2 meters) of a person with COVID-19.  Provide care for or live with a person who is infected with COVID-19.  Spend time in crowded indoor spaces or live in shared housing. Risk for serious illness You are more likely to become seriously ill from the virus if you:   Are 50 years of age or older. The higher your age, the more you are at risk for serious illness.  Live in a nursing home or long-term care facility.  Have cancer.  Have a long-term (chronic) disease such as: ? Chronic lung disease, including chronic obstructive pulmonary disease or asthma. ? A long-term disease that lowers your body's ability to fight infection (immunocompromised). ? Heart disease, including heart failure, a condition in which the arteries that lead to the heart become narrow or blocked (coronary artery disease), a disease which makes the heart muscle thick, weak, or stiff (cardiomyopathy). ? Diabetes. ? Chronic kidney disease. ? Sickle cell disease, a condition in which red blood cells have an abnormal "sickle" shape. ? Liver disease.  Are obese. What are the signs or symptoms? Symptoms of this condition can range from mild to severe. Symptoms may appear any time from 2 to 14 days after being exposed to the virus. They include:  A fever or chills.  A cough.  Difficulty breathing.  Headaches, body aches, or muscle aches.  Runny or stuffy (congested) nose.  A sore throat.  New loss of taste or smell. Some people may also have stomach problems, such as nausea, vomiting, or diarrhea. Other people may not have any symptoms of COVID-19. How is this diagnosed? This condition may be diagnosed based on:  Your signs and symptoms, especially if: ? You live in an area with a COVID-19 outbreak. ? You recently traveled to or from an area where the virus is common. ? You   provide care for or live with a person who was diagnosed with COVID-19. ? You were exposed to a person who was diagnosed with COVID-19.  A physical exam.  Lab tests, which may include: ? Taking a sample of fluid from the back of your nose and throat (nasopharyngeal fluid), your nose, or your throat using a swab. ? A sample of mucus from your lungs (sputum). ? Blood tests.  Imaging tests, which  may include, X-rays, CT scan, or ultrasound. How is this treated? At present, there is no medicine to treat COVID-19. Medicines that treat other diseases are being used on a trial basis to see if they are effective against COVID-19. Your health care provider will talk with you about ways to treat your symptoms. For most people, the infection is mild and can be managed at home with rest, fluids, and over-the-counter medicines. Treatment for a serious infection usually takes places in a hospital intensive care unit (ICU). It may include one or more of the following treatments. These treatments are given until your symptoms improve.  Receiving fluids and medicines through an IV.  Supplemental oxygen. Extra oxygen is given through a tube in the nose, a face mask, or a hood.  Positioning you to lie on your stomach (prone position). This makes it easier for oxygen to get into the lungs.  Continuous positive airway pressure (CPAP) or bi-level positive airway pressure (BPAP) machine. This treatment uses mild air pressure to keep the airways open. A tube that is connected to a motor delivers oxygen to the body.  Ventilator. This treatment moves air into and out of the lungs by using a tube that is placed in your windpipe.  Tracheostomy. This is a procedure to create a hole in the neck so that a breathing tube can be inserted.  Extracorporeal membrane oxygenation (ECMO). This procedure gives the lungs a chance to recover by taking over the functions of the heart and lungs. It supplies oxygen to the body and removes carbon dioxide. Follow these instructions at home: Lifestyle  If you are sick, stay home except to get medical care. Your health care provider will tell you how long to stay home. Call your health care provider before you go for medical care.  Rest at home as told by your health care provider.  Do not use any products that contain nicotine or tobacco, such as cigarettes, e-cigarettes, and  chewing tobacco. If you need help quitting, ask your health care provider.  Return to your normal activities as told by your health care provider. Ask your health care provider what activities are safe for you. General instructions  Take over-the-counter and prescription medicines only as told by your health care provider.  Drink enough fluid to keep your urine pale yellow.  Keep all follow-up visits as told by your health care provider. This is important. How is this prevented?  There is no vaccine to help prevent COVID-19 infection. However, there are steps you can take to protect yourself and others from this virus. To protect yourself:   Do not travel to areas where COVID-19 is a risk. The areas where COVID-19 is reported change often. To identify high-risk areas and travel restrictions, check the CDC travel website: wwwnc.cdc.gov/travel/notices  If you live in, or must travel to, an area where COVID-19 is a risk, take precautions to avoid infection. ? Stay away from people who are sick. ? Wash your hands often with soap and water for 20 seconds. If soap and water   are not available, use an alcohol-based hand sanitizer. ? Avoid touching your mouth, face, eyes, or nose. ? Avoid going out in public, follow guidance from your state and local health authorities. ? If you must go out in public, wear a cloth face covering or face mask. Make sure your mask covers your nose and mouth. ? Avoid crowded indoor spaces. Stay at least 6 feet (2 meters) away from others. ? Disinfect objects and surfaces that are frequently touched every day. This may include:  Counters and tables.  Doorknobs and light switches.  Sinks and faucets.  Electronics, such as phones, remote controls, keyboards, computers, and tablets. To protect others: If you have symptoms of COVID-19, take steps to prevent the virus from spreading to others.  If you think you have a COVID-19 infection, contact your health care  provider right away. Tell your health care team that you think you may have a COVID-19 infection.  Stay home. Leave your house only to seek medical care. Do not use public transport.  Do not travel while you are sick.  Wash your hands often with soap and water for 20 seconds. If soap and water are not available, use alcohol-based hand sanitizer.  Stay away from other members of your household. Let healthy household members care for children and pets, if possible. If you have to care for children or pets, wash your hands often and wear a mask. If possible, stay in your own room, separate from others. Use a different bathroom.  Make sure that all people in your household wash their hands well and often.  Cough or sneeze into a tissue or your sleeve or elbow. Do not cough or sneeze into your hand or into the air.  Wear a cloth face covering or face mask. Make sure your mask covers your nose and mouth. Where to find more information  Centers for Disease Control and Prevention: www.cdc.gov/coronavirus/2019-ncov/index.html  World Health Organization: www.who.int/health-topics/coronavirus Contact a health care provider if:  You live in or have traveled to an area where COVID-19 is a risk and you have symptoms of the infection.  You have had contact with someone who has COVID-19 and you have symptoms of the infection. Get help right away if:  You have trouble breathing.  You have pain or pressure in your chest.  You have confusion.  You have bluish lips and fingernails.  You have difficulty waking from sleep.  You have symptoms that get worse. These symptoms may represent a serious problem that is an emergency. Do not wait to see if the symptoms will go away. Get medical help right away. Call your local emergency services (911 in the [place] Do not drive yourself to the hospital. Let the emergency medical personnel know if you think you have COVID-19. Summary  COVID-19 is a  respiratory infection that is caused by a virus. It is also known as coronavirus disease or novel coronavirus. It can cause serious infections, such as pneumonia, acute respiratory distress syndrome, acute respiratory failure, or sepsis.  The virus that causes COVID-19 is contagious. This means that it can spread from person to person through droplets from breathing, speaking, singing, coughing, or sneezing.  You are more likely to develop a serious illness if you are 50 years of age or older, have a weak immune system, live in a nursing home, or have chronic disease.  There is no medicine to treat COVID-19. Your health care provider will talk with you about ways to treat your symptoms.    Take steps to protect yourself and others from infection. Wash your hands often and disinfect objects and surfaces that are frequently touched every day. Stay away from people who are sick and wear a mask if you are sick. This information is not intended to replace advice given to you by your health care provider. Make sure you discuss any questions you have with your health care provider. Document Revised: 04/25/2019 Document Reviewed: 08/01/2018 Elsevier Patient Education  2020 [employer]  

## 2019-08-13 LAB — CULTURE, BLOOD (ROUTINE X 2)
Culture: NO GROWTH
Special Requests: ADEQUATE

## 2019-08-16 LAB — CULTURE, BLOOD (ROUTINE X 2)
Culture: NO GROWTH
Special Requests: ADEQUATE

## 2019-09-03 ENCOUNTER — Other Ambulatory Visit: Payer: Self-pay | Admitting: Physician Assistant

## 2019-09-03 DIAGNOSIS — Z1231 Encounter for screening mammogram for malignant neoplasm of breast: Secondary | ICD-10-CM

## 2019-10-16 ENCOUNTER — Other Ambulatory Visit: Payer: Self-pay

## 2019-10-16 ENCOUNTER — Ambulatory Visit
Admission: RE | Admit: 2019-10-16 | Discharge: 2019-10-16 | Disposition: A | Discharge status: Home / Self Care | Payer: Medicare PPO | Source: Ambulatory Visit | Attending: Physician Assistant | Admitting: Physician Assistant

## 2019-10-16 DIAGNOSIS — Z1231 Encounter for screening mammogram for malignant neoplasm of breast: Secondary | ICD-10-CM

## 2020-02-05 IMAGING — DX DG ANKLE COMPLETE 3+V*R*
3 series · 3 of 3 positions shown · non-contrast
Comparison: None.

CLINICAL DATA: Pt had fall. Pt was going up the stairs and made it
up to 2 stairs and she fell backwards. Pt unsure why she fell, she
denies dizziness. Pt did not lose consciousness. Pt did hit the back
of her head on the concrete floor.

EXAM:
RIGHT ANKLE - COMPLETE 3+ VIEW

[ankle ap]
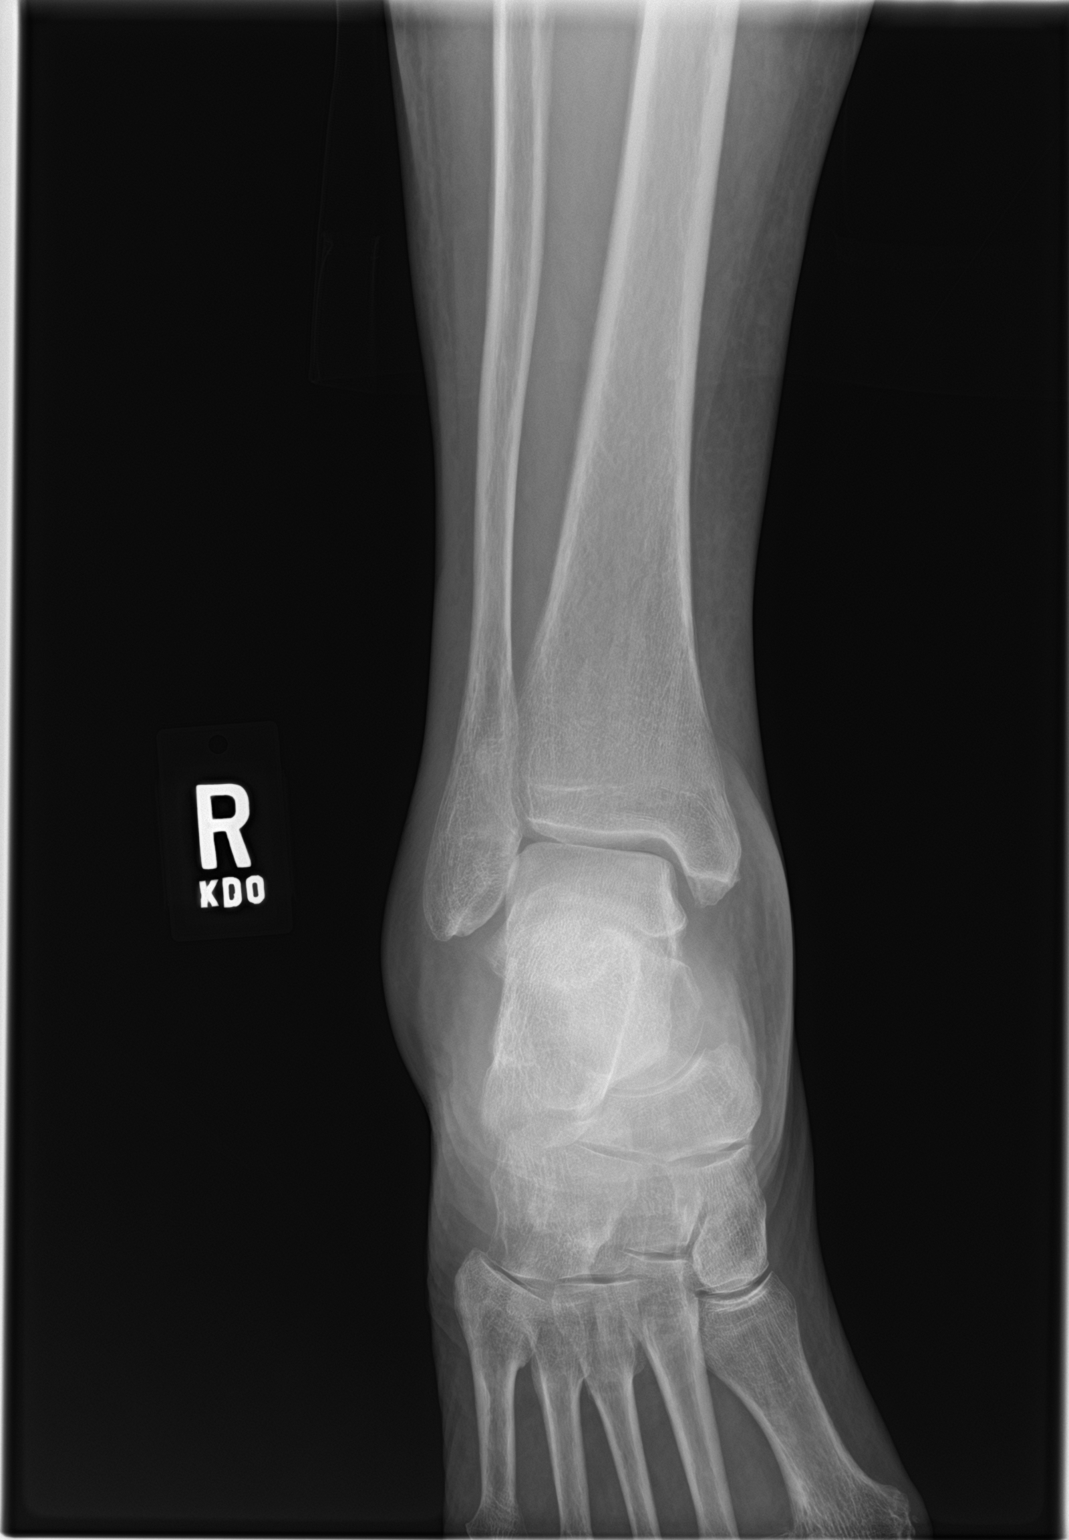

[ankle obl]
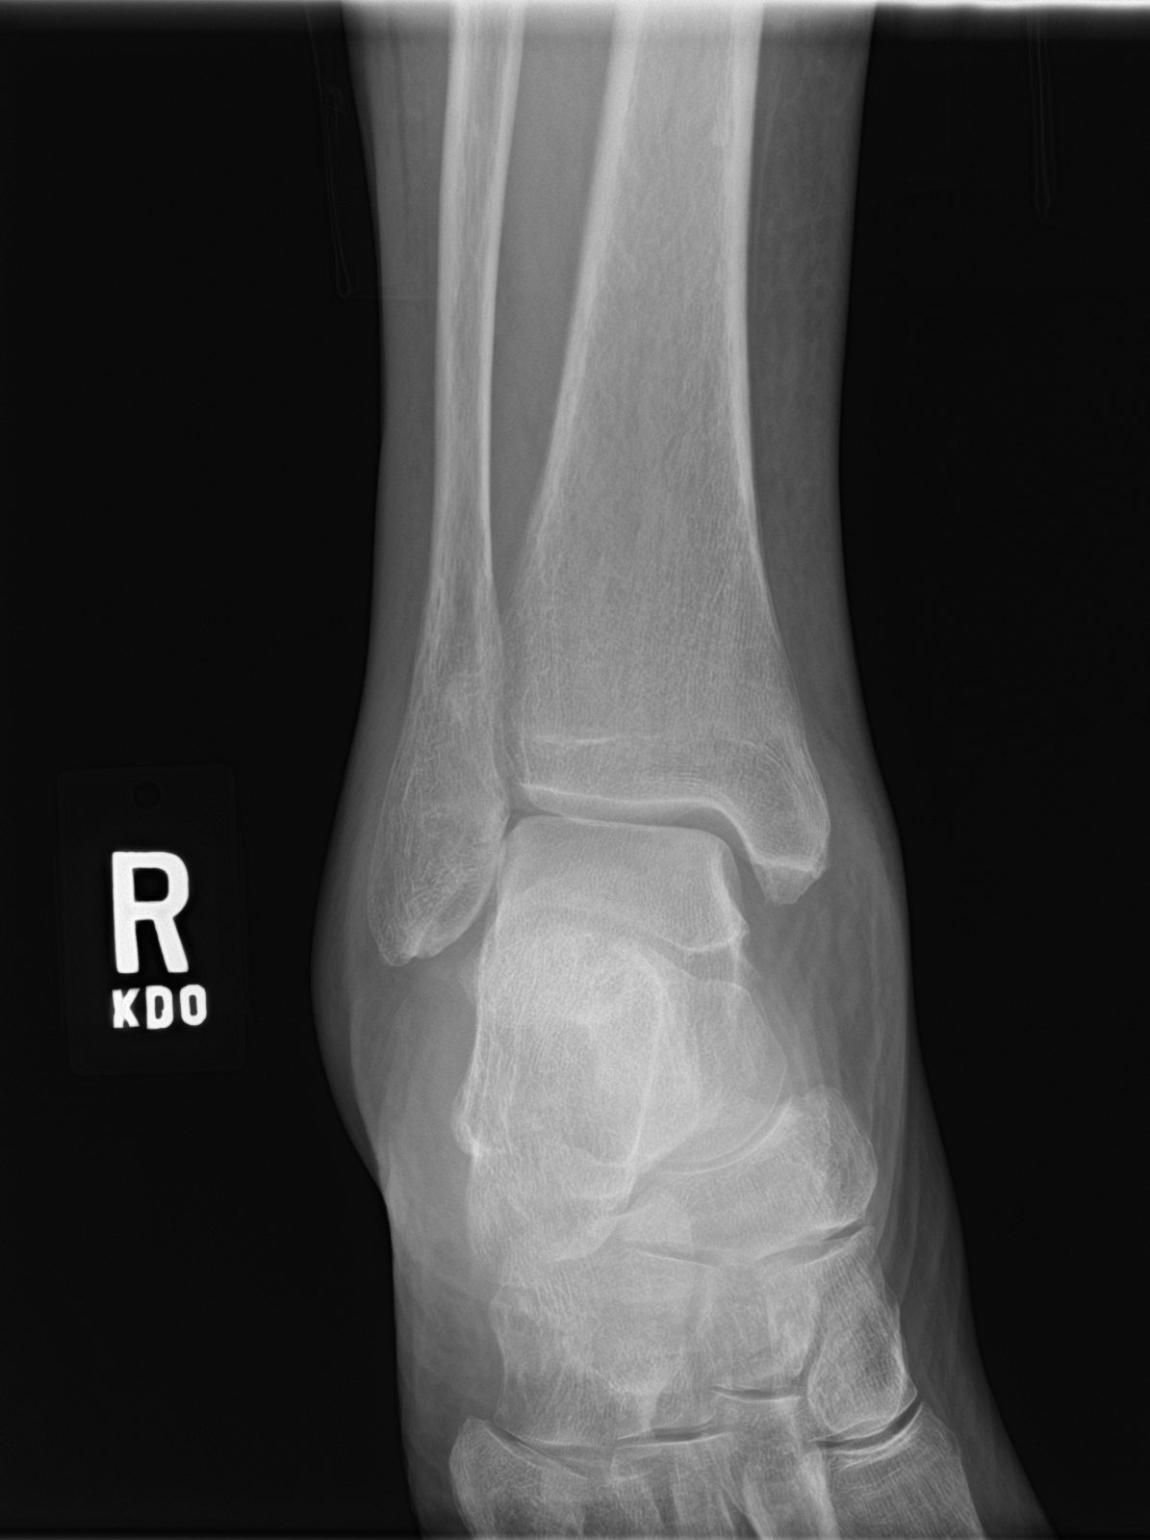

[ankle lat]
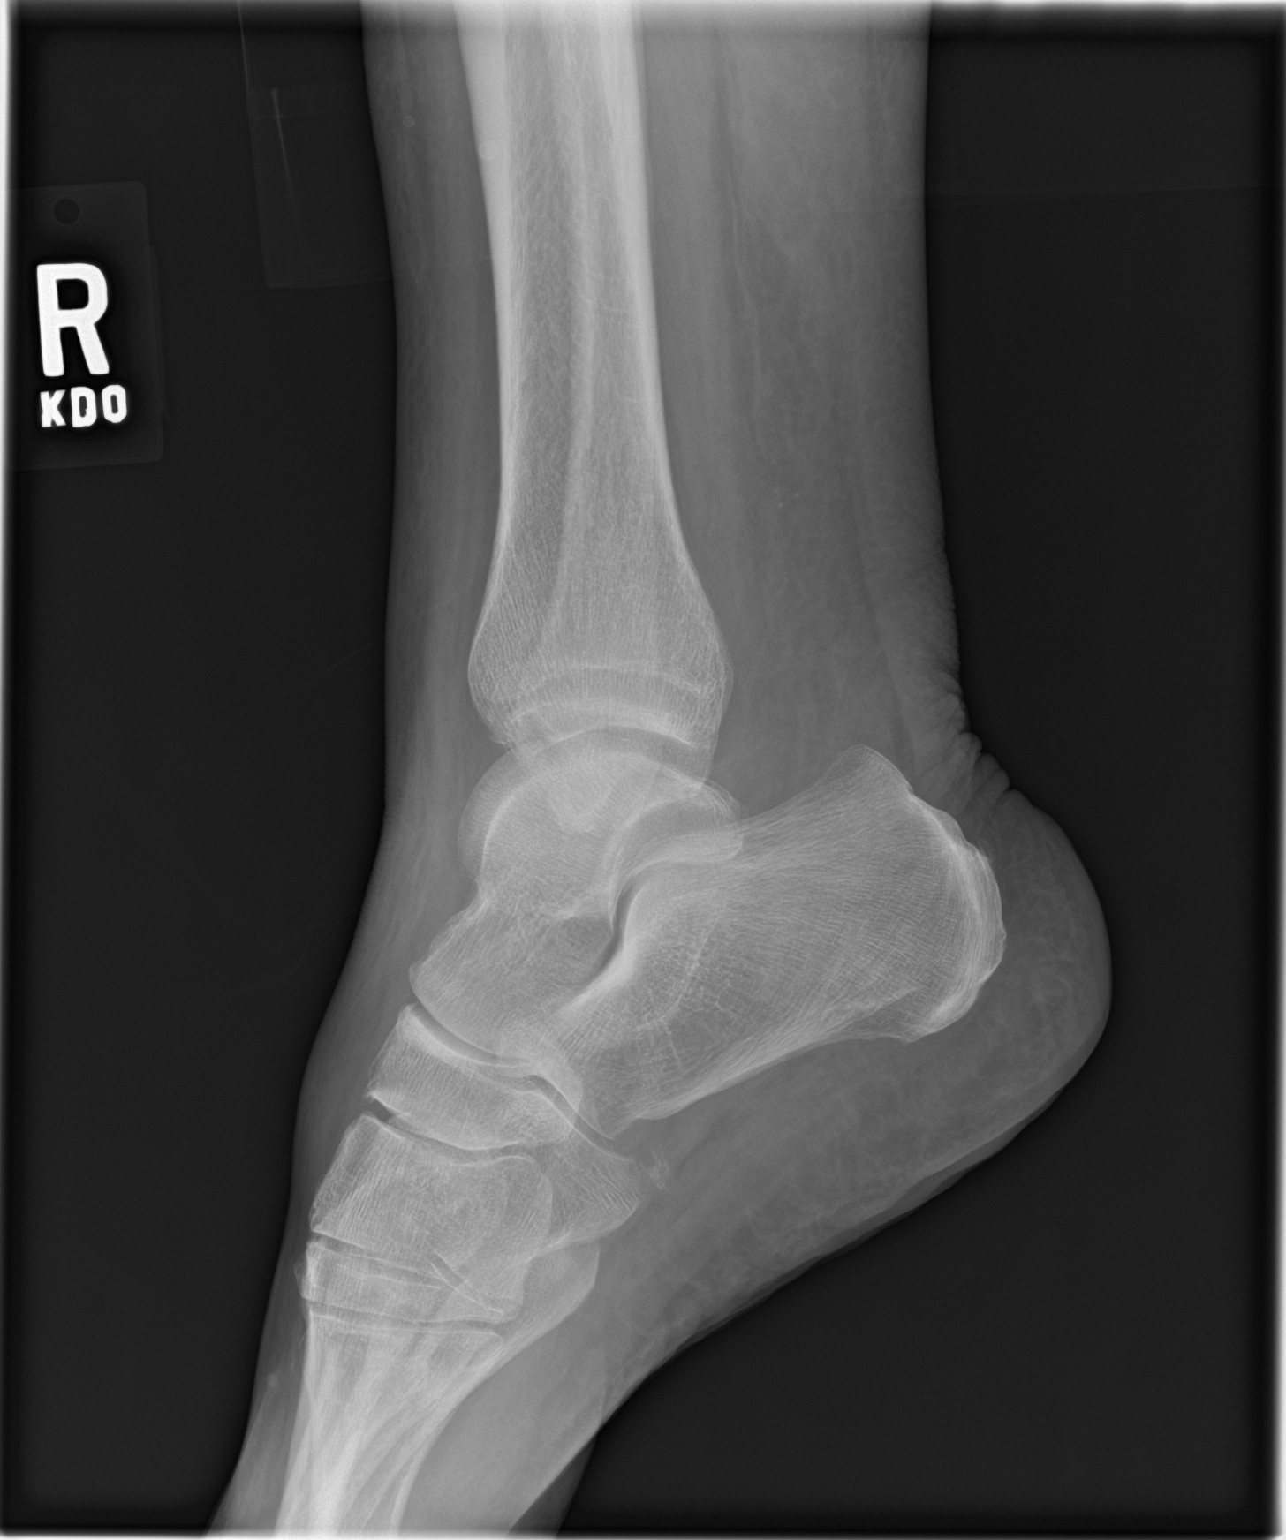

[3 of 3 positions shown; findings below may reference images not displayed]

FINDINGS: There is diffuse soft tissue swelling about the ankle. There is a
small bone density adjacent to the inferior aspect of the cuboid,
raising the question of small avulsion injury. The distal tibia and
fibula are intact.
IMPRESSION: Possible cuboid injury.

Diffuse soft tissue swelling.

## 2020-02-12 NOTE — Progress Notes (Signed)
Follow-up Visit   Date: 02/12/20    Teresa Daniels MRN: [id number] DOB: 1940-10-06   Interim History: Teresa Daniels is a 79 y.o. 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  The patient was accompanied to the clinic by daughter who also provides collateral information.    History of present illness: In April 2019, patient presented to the ER with 2 day history of right sided weakness and falls. MRI showed left subcortical infarct. There was no evidence of large vessel occlusion. Surface echocardiogram did not show a cardioembolic source. She was previously taking aspirin and Zocor at home.  With her new stroke, she was started on dual antiplatelet therapy with aspirin 81 and Plavix 75 mg daily.  His she was discharged to [facility] where she was admitted from 4/30 through 11/22/2017.  She was discharged to rehab facility where she was in a wheelchair and gradually made some improvement of her right arm and leg strength.  Over the following months, strength returned to the right side, but she continues to have imbalance and falls.   She has long history of diabetic neuropathy of the feet and takes gabapentin 344m at bedtime.  UPDATE 08/12/2018:  She started physical therapy for gait and has noticed marked improvement in strength and balance.  She has not suffered any falls and is compliant with using a walker.  She admits to having increased imbalance in the shower, so uses a shower chair. She also puts shirts when sitting to avoid falling. She was also found to have vitamin B12 deficiency and gets injections at home.  In November, she moved into an independent living facility which she really enjoys and has helped her mood.  No new complaints today.   UPDATE 02/13/2020:  She is here for follow-up visit.   She was hospitalized with COVID19 in February and recovered within a week.  She had a few falls in the spring and restarted physical therapy, which has been very helpful.  She has had no falls since this time.  She is very compliant with using her walker.  She enjoys physical therapy and admits to being less compliant with home exercises after she has been discharged.  Her feet paresthesias are well controlled on gabapentin 300 mg at bedtime.  She also applies Voltaren for arthritic pain.  No new complaints today.  No interval TIA or stroke.  Medications:  Current Outpatient Medications on File Prior to Visit  Medication Sig Dispense Refill  . acetaminophen (TYLENOL) 500 MG tablet Take 1,000 mg by mouth at bedtime.    .Marland Kitchenalbuterol (VENTOLIN HFA) 108 (90 Base) MCG/ACT inhaler Inhale 2 puffs into the lungs every 6 (six) hours as needed for wheezing.    . Ascorbic Acid (VITAMIN C PO) Take 500 mg by mouth daily.     .Marland Kitchenatorvastatin (LIPITOR) 40 MG tablet Take 40 mg by mouth every evening.     . beclomethasone (QVAR) 40 MCG/ACT inhaler Inhale 1 puff into the lungs 2 (two) times daily.     . cetirizine (ZYRTEC) 5 MG tablet Take 5 mg by mouth daily at 12 noon.    . cholecalciferol (VITAMIN D) 1000 units tablet Take 1,000 Units by mouth daily.     . clopidogrel (PLAVIX) 75 MG tablet Take 75 mg by mouth daily.     . Cyanocobalamin ([employer] B12 QUICK DISSOLVE)  500 MCG LOZG Take 500 mcg by mouth daily.    Marland Kitchen dexamethasone (DECADRON) 6 MG tablet Take 1 tablet (6 mg total) by mouth daily. 7 tablet 0  . ferrous sulfate 325 (65 FE) MG tablet Take 325 mg by mouth 2 (two) times daily with a meal.     . gabapentin (NEURONTIN) 300 MG capsule Take 300 mg by mouth at bedtime.     . hydrochlorothiazide (HYDRODIURIL) 25 MG tablet Take 25 mg by mouth daily.     Marland Kitchen losartan (COZAAR) 50 MG tablet Take 100 mg by mouth daily.     Marland Kitchen MAGNESIUM PO Take 250 mg by mouth daily.     . Melatonin 10 MG TABS Take 10 mg by mouth every  evening.    . metFORMIN (GLUCOPHAGE) 1000 MG tablet Take 1,000 mg by mouth 2 (two) times daily with a meal. Breakfast and supper    . mirtazapine (REMERON) 15 MG tablet Take 15 mg by mouth every evening.     . montelukast (SINGULAIR) 10 MG tablet Take 10 mg by mouth daily at 12 noon.     . Omega-3 1000 MG CAPS Take 1,000 mg by mouth daily.     . pantoprazole (PROTONIX) 40 MG tablet Take 40 mg by mouth daily.     . sertraline (ZOLOFT) 100 MG tablet Take 100 mg by mouth daily.     Marland Kitchen zinc sulfate 220 (50 Zn) MG capsule Take 1 capsule (220 mg total) by mouth daily.     No current facility-administered medications on file prior to visit.    Allergies: No Known Allergies  Review of Systems:  CONSTITUTIONAL: No fevers, chills, night sweats, or weight loss.  EYES: No visual changes or eye pain ENT: No hearing changes.  No history of nose bleeds.   RESPIRATORY: No cough, wheezing and shortness of breath.   CARDIOVASCULAR: Negative for chest pain, and palpitations.   GI: Negative for abdominal discomfort, blood in stools or black stools.  No recent change in bowel habits.   GU:  No history of incontinence.   MUSCLOSKELETAL: No history of joint pain or swelling.  No myalgias.   SKIN: Negative for lesions, rash, and itching.   ENDOCRINE: Negative for cold or heat intolerance, polydipsia or goiter.   PSYCH:  No depression or anxiety symptoms.   NEURO: As Above.   Vital Signs:  There were no vitals taken for this visit.  General Medical Exam:   General:  Well appearing, comfortable  Eyes/ENT: see cranial nerve examination.   Neck: No masses appreciated.  Full range of motion without tenderness.  No carotid bruits. Respiratory:  Clear to auscultation, good air entry bilaterally.   Cardiac:  Regular rate and rhythm, no murmur.   Ext:  No deformity  Neurological Exam: MENTAL STATUS including orientation to time, place, person, recent and remote memory, attention span and concentration,  language, and fund of knowledge is normal.  Speech is not slightly hoarse, not dysarthric.  CRANIAL NERVES:   Pupils equal round and reactive to light.  Normal conjugate, extra-ocular eye movements in all directions of gaze.  No ptosis.  Face is symmetric. Palate elevates symmetrically.  Tongue is midline.  MOTOR:  No atrophy, fasciculations or abnormal movements.  No pronator drift.  Tone is normal.    Right Upper Extremity:    Left Upper Extremity:    Deltoid  5/5   Deltoid  5/5   Biceps  5/5   Biceps  5/5  Triceps  5/5   Triceps  5/5   Wrist extensors  5/5   Wrist extensors  5/5   Wrist flexors  5/5   Wrist flexors  5/5   Finger extensors  5/5   Finger extensors  5/5   Finger flexors  5/5   Finger flexors  5/5   Dorsal interossei  5-/5   Dorsal interossei  5-/5   Abductor pollicis  5/5   Abductor pollicis  5/5   Tone (Ashworth scale)  0  Tone (Ashworth scale)  0   Right Lower Extremity:    Left Lower Extremity:    Hip flexors  5-/5   Hip flexors  5/5   Hip extensors  5/5   Hip extensors  5/5   Knee flexors  5/5   Knee flexors  5/5   Knee extensors  5/5   Knee extensors  5/5   Dorsiflexors  5/5   Dorsiflexors  5/5   Plantarflexors  5/5   Plantarflexors  5/5   Toe extensors  4/5   Toe extensors  4/5   Toe flexors  4/5   Toe flexors  4/5   Tone (Ashworth scale)  0  Tone (Ashworth scale)  0   MSRs:  Reflexes are 2+/4 in the upper extremities, 1+ bilateral patella, and absent at the ankles.  SENSORY: Vibration is absent distal to knees bilaterally.   Sensation is intact in the arms. Rhomberg test is positive.   COORDINATION/GAIT:  Normal finger-to- nose-finger.  Intact rapid alternating movements bilaterally.  Gait is mildly wide-based, mild ataxia when unassisted, more stable with a walker.   Data: Labs 5/202019:  HbA1c 6.4 Labs 03/18/2018: TSH 1.130 Labs 04/05/2018 ESR 21, CRP, folate 14.9, vitamin B12 239, copper 95, vitamin E 6.6  Labs 06/11/2017: TSH 1.4, hemoglobin A1c 6.3,  LDL 78, cholesterol 151 triglycerides 142 HDL 47, complete metabolic panel all within normal limits.  Diagnostic studies April 2019 TTE: EF is mildly reduced at 16-10%, grade 1 diastolic dysfunction. Right ventricle appears normal. Left atrium is mildly enlarged, right atrium is normal. Trivial mitral regurgitation and mild tricuspid regurgitation. CT angiogram head and neck: Mild intracranial atherosclerotic disease. Mild calcification and right carotid bulb, less than 50% severity. MRI brain: Acute nonhemorrhagic cerebrovascular accident along the left putamen and left corona radiate.  30-day cardiac monitor - normal  MRI lumbar spine 04/17/2018: Mild edema in the left sacrum, possibly due to recent fracture.  Correlate with pain in this area. MRI of the pelvis with attention to the sacrum could be performed for confirmation depending on the history and physical findings. Mild to moderate spinal stenosis L3-4 with mild subarticular stenosis bilaterally. Grade 1 anterolisthesis L4-5. Mild to moderate spinal stenosis with moderate subarticular stenosis on the left. Mild degenerative changes L5-S1 with mild subarticular stenosis bilaterally.    IMPRESSION/PLAN: 1.  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  - continue gabapentin 330m at bedtime   3.  Vitamin B12 deficiency, continue oral B12 10027m daily  4.  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  No new events in the past 2-years. Continue Plavix 75 mg daily  and Lipitor 40 mg daily, which is prescribed by her PCP  Return to clinic as  needed   Thank you for allowing me to participate in patient's care.  If I can answer any additional questions, I would be pleased to do so.    Sincerely,    Aleana Fifita K. Posey Pronto, DO

## 2020-02-13 ENCOUNTER — Ambulatory Visit (INDEPENDENT_AMBULATORY_CARE_PROVIDER_SITE_OTHER): Payer: Medicare PPO | Admitting: Neurology

## 2020-02-13 ENCOUNTER — Other Ambulatory Visit: Payer: Self-pay

## 2020-02-13 ENCOUNTER — Encounter: Payer: Self-pay | Admitting: Neurology

## 2020-02-13 VITALS — BP 154/71 | HR 77 | Ht 65.0 in | Wt 152.0 lb

## 2020-02-13 DIAGNOSIS — E1142 Type 2 diabetes mellitus with diabetic polyneuropathy: Secondary | ICD-10-CM

## 2020-02-13 DIAGNOSIS — R2681 Unsteadiness on feet: Secondary | ICD-10-CM | POA: Diagnosis not present

## 2020-02-13 DIAGNOSIS — I639 Cerebral infarction, unspecified: Secondary | ICD-10-CM | POA: Diagnosis not present

## 2020-02-13 DIAGNOSIS — E538 Deficiency of other specified B group vitamins: Secondary | ICD-10-CM | POA: Diagnosis not present

## 2020-02-13 NOTE — Patient Instructions (Signed)
Continue to take medications as you are taking  Return to clinic as needed

## 2020-06-07 ENCOUNTER — Encounter (HOSPITAL_BASED_OUTPATIENT_CLINIC_OR_DEPARTMENT_OTHER): Payer: Self-pay | Admitting: *Deleted

## 2020-06-07 ENCOUNTER — Emergency Department (HOSPITAL_BASED_OUTPATIENT_CLINIC_OR_DEPARTMENT_OTHER): Payer: Medicare PPO

## 2020-06-07 ENCOUNTER — Emergency Department (HOSPITAL_BASED_OUTPATIENT_CLINIC_OR_DEPARTMENT_OTHER)
Admission: EM | Admit: 2020-06-07 | Discharge: 2020-06-07 | Disposition: A | Discharge status: Home / Self Care | Payer: Medicare PPO | Attending: Emergency Medicine | Admitting: Emergency Medicine

## 2020-06-07 ENCOUNTER — Other Ambulatory Visit: Payer: Self-pay

## 2020-06-07 DIAGNOSIS — Z7951 Long term (current) use of inhaled steroids: Secondary | ICD-10-CM | POA: Insufficient documentation

## 2020-06-07 DIAGNOSIS — Z20822 Contact with and (suspected) exposure to covid-19: Secondary | ICD-10-CM | POA: Diagnosis not present

## 2020-06-07 DIAGNOSIS — Z7984 Long term (current) use of oral hypoglycemic drugs: Secondary | ICD-10-CM | POA: Insufficient documentation

## 2020-06-07 DIAGNOSIS — J45909 Unspecified asthma, uncomplicated: Secondary | ICD-10-CM | POA: Diagnosis not present

## 2020-06-07 DIAGNOSIS — E1142 Type 2 diabetes mellitus with diabetic polyneuropathy: Secondary | ICD-10-CM | POA: Diagnosis not present

## 2020-06-07 DIAGNOSIS — I129 Hypertensive chronic kidney disease with stage 1 through stage 4 chronic kidney disease, or unspecified chronic kidney disease: Secondary | ICD-10-CM | POA: Diagnosis not present

## 2020-06-07 DIAGNOSIS — R531 Weakness: Secondary | ICD-10-CM

## 2020-06-07 DIAGNOSIS — Z87891 Personal history of nicotine dependence: Secondary | ICD-10-CM | POA: Diagnosis not present

## 2020-06-07 DIAGNOSIS — Z8616 Personal history of COVID-19: Secondary | ICD-10-CM | POA: Diagnosis not present

## 2020-06-07 DIAGNOSIS — Z79899 Other long term (current) drug therapy: Secondary | ICD-10-CM | POA: Insufficient documentation

## 2020-06-07 DIAGNOSIS — N183 Chronic kidney disease, stage 3 unspecified: Secondary | ICD-10-CM | POA: Diagnosis not present

## 2020-06-07 LAB — CBC
HCT: 26.4 % — ABNORMAL LOW (ref 36.0–46.0)
Hemoglobin: 9 g/dL — ABNORMAL LOW (ref 12.0–15.0)
MCH: 32.6 pg (ref 26.0–34.0)
MCHC: 34.1 g/dL (ref 30.0–36.0)
MCV: 95.7 fL (ref 80.0–100.0)
Platelets: 237 10*3/uL (ref 150–400)
RBC: 2.76 MIL/uL — ABNORMAL LOW (ref 3.87–5.11)
RDW: 12.7 % (ref 11.5–15.5)
WBC: 5.9 10*3/uL (ref 4.0–10.5)
nRBC: 0 % (ref 0.0–0.2)

## 2020-06-07 LAB — COMPREHENSIVE METABOLIC PANEL
ALT: 10 U/L (ref 0–44)
AST: 19 U/L (ref 15–41)
Albumin: 3.6 g/dL (ref 3.5–5.0)
Alkaline Phosphatase: 92 U/L (ref 38–126)
Anion gap: 14 (ref 5–15)
BUN: 37 mg/dL — ABNORMAL HIGH (ref 8–23)
CO2: 26 mmol/L (ref 22–32)
Calcium: 9.3 mg/dL (ref 8.9–10.3)
Chloride: 95 mmol/L — ABNORMAL LOW (ref 98–111)
Creatinine, Ser: 2.01 mg/dL — ABNORMAL HIGH (ref 0.44–1.00)
GFR, Estimated: 25 mL/min — ABNORMAL LOW (ref >60)
Glucose, Bld: 158 mg/dL — ABNORMAL HIGH (ref 70–99)
Potassium: 3.1 mmol/L — ABNORMAL LOW (ref 3.5–5.1)
Sodium: 135 mmol/L (ref 135–145)
Total Bilirubin: 0.8 mg/dL (ref 0.3–1.2)
Total Protein: 6.8 g/dL (ref 6.5–8.1)

## 2020-06-07 LAB — URINALYSIS, ROUTINE W REFLEX MICROSCOPIC
Bilirubin Urine: NEGATIVE
Glucose, UA: NEGATIVE mg/dL
Hgb urine dipstick: NEGATIVE
Ketones, ur: NEGATIVE mg/dL
Leukocytes,Ua: NEGATIVE
Nitrite: NEGATIVE
Protein, ur: NEGATIVE mg/dL
Specific Gravity, Urine: 1.015 (ref 1.005–1.030)
pH: 6 (ref 5.0–8.0)

## 2020-06-07 LAB — RESP PANEL BY RT-PCR (FLU A&B, COVID) ARPGX2
Influenza A by PCR: NEGATIVE
Influenza B by PCR: NEGATIVE
SARS Coronavirus 2 by RT PCR: NEGATIVE

## 2020-06-07 LAB — LIPASE, BLOOD: Lipase: 22 U/L (ref 11–51)

## 2020-06-07 MED ORDER — SODIUM CHLORIDE 0.9 % IV BOLUS
1000.0000 mL | Freq: Once | INTRAVENOUS | Status: AC
  Administered 2020-06-07: 1000 mL via INTRAVENOUS

## 2020-06-07 NOTE — ED Provider Notes (Signed)
[facility] EMERGENCY DEPARTMENT Provider Note   CSN: [id number] Arrival date & time: 06/07/20  1509     History Chief Complaint  Patient presents with  . Weakness    Teresa Daniels is a 79 y.o. female.  The history is provided by the patient.  Weakness Severity:  Mild Onset quality:  Gradual Timing:  Intermittent Progression:  Waxing and waning Chronicity:  New Context: dehydration (possible? has had big work up outpatient for generalized fatigue for past few weeks)   Relieved by:  Nothing Worsened by:  Nothing Associated symptoms: foul-smelling urine (new over the last day or so)   Associated symptoms: no abdominal pain, no arthralgias, no chest pain, no cough, no dysuria, no fever, no seizures, no shortness of breath and no vomiting        Past Medical History:  Diagnosis Date  . Asthma   . Diabetes mellitus without complication ([place])   . Hypertension   . Stroke [facility])     Patient Active Problem List   Diagnosis Date Noted  . COVID-19 08/08/2019  . Acute respiratory failure with hypoxemia ([place]) 08/08/2019  . History of CVA (cerebrovascular accident) 08/08/2019  . Anemia 08/08/2019  . CKD (chronic kidney disease), stage III ([place]) 08/08/2019  . Dehydration   . Vitamin B12 deficiency 02/10/2019  . Unsteady gait 02/10/2019  . Ischemic stroke ([place]) 12/06/2017  . Diabetic polyneuropathy associated with type 2 diabetes mellitus ([place]) 12/06/2017    Past Surgical History:  Procedure Laterality Date  . BREAST LUMPECTOMY Left   . CESAREAN SECTION       OB History   No obstetric history on file.     No family history on file.  Social History   Tobacco Use  . Smoking status: Former Smoker    Quit date: 1969    Years since quitting: 52.9  . Smokeless tobacco: Never Used  Vaping Use  . Vaping Use: Never used  Substance Use Topics  . Alcohol use: Not Currently  . Drug use: Never    Home Medications Prior to Admission medications    Medication Sig Start Date End Date Taking? Authorizing Provider  colestipol (COLESTID) 1 g tablet Take by mouth. 04/27/20  Yes [provider]  megestrol (MEGACE) 40 MG tablet Take by mouth. 06/04/20  Yes [provider]  acetaminophen (TYLENOL) 500 MG tablet Take 1,000 mg by mouth at bedtime.    [provider]  albuterol (VENTOLIN HFA) 108 (90 Base) MCG/ACT inhaler Inhale 2 puffs into the lungs every 6 (six) hours as needed for wheezing.    [provider]  Ascorbic Acid (VITAMIN C PO) Take 500 mg by mouth daily.     [provider]  atorvastatin (LIPITOR) 40 MG tablet Take 40 mg by mouth every evening.     [provider]  beclomethasone (QVAR) 40 MCG/ACT inhaler Inhale 1 puff into the lungs 2 (two) times daily.     [provider]  cetirizine (ZYRTEC) 5 MG tablet Take 5 mg by mouth daily at 12 noon.    [provider]  cholecalciferol (VITAMIN D) 1000 units tablet Take 1,000 Units by mouth daily.     [provider]  clopidogrel (PLAVIX) 75 MG tablet Take 75 mg by mouth daily.     [provider]  Cyanocobalamin ([employer] B12 QUICK DISSOLVE) 500 MCG LOZG Take 500 mcg by mouth daily.    [provider]  doxycycline (MONODOX) 100 MG capsule Take by mouth. 05/28/20  06/07/20  [provider]  ferrous sulfate 325 (65 FE) MG tablet Take 325 mg by mouth 2 (two) times daily with a meal.     [provider]  gabapentin (NEURONTIN) 300 MG capsule Take 300 mg by mouth at bedtime.  11/26/17   [provider]  hydrochlorothiazide (HYDRODIURIL) 25 MG tablet Take 25 mg by mouth daily.  11/26/17   [provider]  losartan (COZAAR) 50 MG tablet Take 100 mg by mouth daily.     [provider]  MAGNESIUM PO Take 250 mg by mouth daily.     [provider]  Melatonin 10 MG TABS Take 10 mg by mouth every evening.    [provider]  metFORMIN (GLUCOPHAGE) 1000  MG tablet Take 1,000 mg by mouth 2 (two) times daily with a meal. Breakfast and supper    [provider]  mirtazapine (REMERON) 15 MG tablet Take 15 mg by mouth every evening.  04/25/18   [provider]  montelukast (SINGULAIR) 10 MG tablet Take 10 mg by mouth daily at 12 noon.     [provider]  Omega-3 1000 MG CAPS Take 1,000 mg by mouth daily.     [provider]  pantoprazole (PROTONIX) 40 MG tablet Take 40 mg by mouth daily.     [provider]  sertraline (ZOLOFT) 100 MG tablet Take 100 mg by mouth daily.  02/18/18   [provider]  zinc sulfate 220 (50 Zn) MG capsule Take 1 capsule (220 mg total) by mouth daily. Patient not taking: Reported on 02/13/2020 08/11/19   Georgette Shell, MD    Allergies    Patient has no known allergies.  Review of Systems   Review of Systems  Constitutional: Negative for chills and fever.  HENT: Negative for ear pain and sore throat.   Eyes: Negative for pain and visual disturbance.  Respiratory: Negative for cough and shortness of breath.   Cardiovascular: Negative for chest pain and palpitations.  Gastrointestinal: Negative for abdominal pain and vomiting.  Genitourinary: Negative for dysuria and hematuria.  Musculoskeletal: Negative for arthralgias and back pain.  Skin: Negative for color change and rash.  Neurological: Positive for weakness. Negative for seizures and syncope.  All other systems reviewed and are negative.   Physical Exam Updated Vital Signs BP (!) 153/85   Pulse 80   Temp 98.1 F (36.7 C) (Oral)   Resp 19   Ht 5\' 5"  (1.651 m)   Wt 62 kg   SpO2 97%   BMI 22.75 kg/m   Physical Exam Vitals and nursing note reviewed.  Constitutional:      General: She is not in acute distress.    Appearance: She is well-developed. She is not ill-appearing.  HENT:     Head: Normocephalic and atraumatic.     Mouth/Throat:     Mouth: Mucous membranes are moist.  Eyes:      Extraocular Movements: Extraocular movements intact.     Conjunctiva/sclera: Conjunctivae normal.     Pupils: Pupils are equal, round, and reactive to light.  Cardiovascular:     Rate and Rhythm: Normal rate and regular rhythm.     Pulses: Normal pulses.     Heart sounds: Normal heart sounds. No murmur heard.   Pulmonary:     Effort: Pulmonary effort is normal. No respiratory distress.     Breath sounds: Normal breath sounds.  Abdominal:     General: Abdomen is flat.  Palpations: Abdomen is soft.     Tenderness: There is no abdominal tenderness.  Musculoskeletal:     Cervical back: Normal range of motion and neck supple.  Skin:    General: Skin is warm and dry.     Capillary Refill: Capillary refill takes less than 2 seconds.  Neurological:     General: No focal deficit present.     Mental Status: She is alert and oriented to person, place, and time.     Cranial Nerves: No cranial nerve deficit.     Sensory: No sensory deficit.     Motor: No weakness.     ED Results / Procedures / Treatments   Labs (all labs ordered are listed, but only abnormal results are displayed) Labs Reviewed  COMPREHENSIVE METABOLIC PANEL - Abnormal; Notable for the following components:      Result Value   Potassium 3.1 (*)    Chloride 95 (*)    Glucose, Bld 158 (*)    BUN 37 (*)    Creatinine, Ser 2.01 (*)    GFR, Estimated 25 (*)    All other components within normal limits  CBC - Abnormal; Notable for the following components:   RBC 2.76 (*)    Hemoglobin 9.0 (*)    HCT 26.4 (*)    All other components within normal limits  RESP PANEL BY RT-PCR (FLU A&B, COVID) ARPGX2  LIPASE, BLOOD  URINALYSIS, ROUTINE W REFLEX MICROSCOPIC    EKG None  Radiology CT Head Wo Contrast  Result Date: 06/07/2020 CLINICAL DATA:  Fatigue. EXAM: CT HEAD WITHOUT CONTRAST TECHNIQUE: Contiguous axial images were obtained from the base of the skull through the vertex without intravenous contrast.  COMPARISON:  03/08/2018 FINDINGS: Brain: There is no evidence for acute hemorrhage, hydrocephalus, mass lesion, or abnormal extra-axial fluid collection. No definite CT evidence for acute infarction. Diffuse loss of parenchymal volume is consistent with atrophy. Patchy low attenuation in the deep hemispheric and periventricular white matter is nonspecific, but likely reflects chronic microvascular ischemic demyelination. Stable lacunar infarct left basal ganglia. Vascular: No hyperdense vessel or unexpected calcification. Skull: No evidence for fracture. No worrisome lytic or sclerotic lesion. Sinuses/Orbits: The visualized paranasal sinuses and mastoid air cells are clear. Visualized portions of the globes and intraorbital fat are unremarkable. Other: None. IMPRESSION: 1. No acute intracranial abnormality. 2. Atrophy with chronic small vessel white matter ischemic disease. Electronically Signed   By: Misty Stanley M.D.   On: 06/07/2020 17:15   DG Chest Portable 1 View  Result Date: 06/07/2020 CLINICAL DATA:  Weakness EXAM: PORTABLE CHEST 1 VIEW COMPARISON:  August 07, 2019 FINDINGS: There is ill-defined airspace opacity in the left base. Lungs elsewhere are clear. Heart size and pulmonary vascularity are normal. No adenopathy. Degenerative change noted in left shoulder. IMPRESSION: Ill-defined opacity left base, likely representing focus of pneumonia. Lungs elsewhere clear. Cardiac silhouette normal. Electronically Signed   By: Lowella Grip III M.D.   On: 06/07/2020 16:50    Procedures Procedures (including critical care time)  Medications Ordered in ED Medications  sodium chloride 0.9 % bolus 1,000 mL (0 mLs Intravenous Stopped 06/07/20 1800)    ED Course  I have reviewed the triage vital signs and the nursing notes.  Pertinent labs & imaging results that were available during my care of the patient were reviewed by me and considered in my medical decision making (see chart for  details).    MDM Rules/Calculators/A&P  Teresa Daniels is a 79 year old female with history of stroke presents to the ED with generalized weakness.  Currently on antibiotics for possible pneumonia.  About 5 to 7 days and on antibiotics.  Has been feeling generally weak for multiple weeks beyond this.  Otherwise no real focal abdominal pain, chest pain, shortness of breath.  Overall she is well-appearing.  Chest x-ray shows questionable pneumonia.  She has no significant leukocytosis, anemia, electrolyte abnormality otherwise.  Creatinine is 2.  Creatinine was greater than 3 last week at primary care doctor's office and overall is improving.  She is given a fluid bolus.  Covid test and influenza test is negative.  Urinalysis negative for infection.  Head CT showed no acute findings.  Overall suspect some mild dehydration in the setting of ongoing pneumonia.  Overall believe that she is likely improving.  Recommend that she complete course of antibiotics and follow-up with primary care doctor for repeat chest x-ray and evaluation.  No concern for sepsis.  Given return precautions and discharged from ED in good condition.  This chart was dictated using voice recognition software.  Despite best efforts to proofread,  errors can occur which can change the documentation meaning.    Final Clinical Impression(s) / ED Diagnoses Final diagnoses:  Weakness    Rx / DC Orders ED Discharge Orders    None       Lennice Sites, DO 06/07/20 1943

## 2020-06-07 NOTE — ED Triage Notes (Signed)
"  Sick" since October. She is pale. Weak. Fatigue. Her home Covid test was negative 4 weeks ago. She lives at an independent living facility. She is having frequent falls.

## 2020-06-07 NOTE — Discharge Instructions (Addendum)
Follow-up with your primary care doctor at the end of this week for repeat chest x-ray and repeat evaluation.  Please return if symptoms worsen.

## 2020-06-25 ENCOUNTER — Other Ambulatory Visit: Payer: Self-pay

## 2020-06-25 ENCOUNTER — Emergency Department (HOSPITAL_COMMUNITY): Payer: Medicare PPO

## 2020-06-25 ENCOUNTER — Emergency Department (HOSPITAL_COMMUNITY)
Admission: EM | Admit: 2020-06-25 | Discharge: 2020-06-25 | Disposition: A | Discharge status: Home / Self Care | Payer: Medicare PPO | Attending: Emergency Medicine | Admitting: Emergency Medicine

## 2020-06-25 DIAGNOSIS — N1832 Chronic kidney disease, stage 3b: Secondary | ICD-10-CM | POA: Diagnosis not present

## 2020-06-25 DIAGNOSIS — I129 Hypertensive chronic kidney disease with stage 1 through stage 4 chronic kidney disease, or unspecified chronic kidney disease: Secondary | ICD-10-CM | POA: Diagnosis not present

## 2020-06-25 DIAGNOSIS — E1140 Type 2 diabetes mellitus with diabetic neuropathy, unspecified: Secondary | ICD-10-CM | POA: Diagnosis not present

## 2020-06-25 DIAGNOSIS — R29898 Other symptoms and signs involving the musculoskeletal system: Secondary | ICD-10-CM | POA: Diagnosis not present

## 2020-06-25 DIAGNOSIS — N179 Acute kidney failure, unspecified: Secondary | ICD-10-CM

## 2020-06-25 DIAGNOSIS — Z79899 Other long term (current) drug therapy: Secondary | ICD-10-CM | POA: Insufficient documentation

## 2020-06-25 DIAGNOSIS — E1122 Type 2 diabetes mellitus with diabetic chronic kidney disease: Secondary | ICD-10-CM | POA: Insufficient documentation

## 2020-06-25 DIAGNOSIS — R531 Weakness: Secondary | ICD-10-CM | POA: Diagnosis present

## 2020-06-25 DIAGNOSIS — J45909 Unspecified asthma, uncomplicated: Secondary | ICD-10-CM | POA: Insufficient documentation

## 2020-06-25 DIAGNOSIS — D649 Anemia, unspecified: Secondary | ICD-10-CM

## 2020-06-25 DIAGNOSIS — I6782 Cerebral ischemia: Secondary | ICD-10-CM | POA: Diagnosis not present

## 2020-06-25 DIAGNOSIS — D631 Anemia in chronic kidney disease: Secondary | ICD-10-CM | POA: Insufficient documentation

## 2020-06-25 DIAGNOSIS — Z87891 Personal history of nicotine dependence: Secondary | ICD-10-CM | POA: Insufficient documentation

## 2020-06-25 DIAGNOSIS — Z8616 Personal history of COVID-19: Secondary | ICD-10-CM | POA: Insufficient documentation

## 2020-06-25 DIAGNOSIS — Z7984 Long term (current) use of oral hypoglycemic drugs: Secondary | ICD-10-CM | POA: Diagnosis not present

## 2020-06-25 LAB — CBC
HCT: 24.3 % — ABNORMAL LOW (ref 36.0–46.0)
Hemoglobin: 7.7 g/dL — ABNORMAL LOW (ref 12.0–15.0)
MCH: 30.9 pg (ref 26.0–34.0)
MCHC: 31.7 g/dL (ref 30.0–36.0)
MCV: 97.6 fL (ref 80.0–100.0)
Platelets: 272 10*3/uL (ref 150–400)
RBC: 2.49 MIL/uL — ABNORMAL LOW (ref 3.87–5.11)
RDW: 13.2 % (ref 11.5–15.5)
WBC: 7 10*3/uL (ref 4.0–10.5)
nRBC: 0 % (ref 0.0–0.2)

## 2020-06-25 LAB — BASIC METABOLIC PANEL
Anion gap: 11 (ref 5–15)
BUN: 20 mg/dL (ref 8–23)
CO2: 26 mmol/L (ref 22–32)
Calcium: 9 mg/dL (ref 8.9–10.3)
Chloride: 103 mmol/L (ref 98–111)
Creatinine, Ser: 1.75 mg/dL — ABNORMAL HIGH (ref 0.44–1.00)
GFR, Estimated: 29 mL/min — ABNORMAL LOW (ref >60)
Glucose, Bld: 160 mg/dL — ABNORMAL HIGH (ref 70–99)
Potassium: 3.5 mmol/L (ref 3.5–5.1)
Sodium: 140 mmol/L (ref 135–145)

## 2020-06-25 NOTE — Discharge Instructions (Addendum)
You were seen in the emergency department for right hand weakness and clumsiness.  You had lab work and an MRI of your brain that did not show an obvious explanation for these symptoms.  You were evaluated by Dr. Cheral Marker from neurology who recommended outpatient follow-up with your neurologist Dr. Posey Pronto.  Please return to the emergency department if any worsening or concerning symptoms.

## 2020-06-25 NOTE — ED Provider Notes (Signed)
[facility] EMERGENCY DEPARTMENT Provider Note   CSN: [id number] Arrival date & time: 06/25/20  1044     History No chief complaint on file.   Teresa Daniels is a 79 y.o. female.  She woke up this morning with decrease use of her right hand.  She said she was fine last night.  It does not seem to be any other neurologic deficits.  She thought she might of slept on it wrong but it does not seem to have improved.  No headache blurry vision double vision.  Prior history of stroke a few years ago with left her with right-sided deficits and she is getting physical therapy for that.  She is also had some swelling in her lower extremities some elevated blood sugars and some increase in her creatinine which caused her doctors to stop her Metformin.  She is also been more anemic than baseline.  The history is provided by the patient and a relative.  Extremity Weakness This is a new problem. The current episode started 12 to 24 hours ago. The problem occurs constantly. The problem has not changed since onset.Pertinent negatives include no chest pain, no abdominal pain, no headaches and no shortness of breath. Nothing aggravates the symptoms. Nothing relieves the symptoms. She has tried nothing for the symptoms. The treatment provided no relief.       Past Medical History:  Diagnosis Date  . Asthma   . Diabetes mellitus without complication ([place])   . Hypertension   . Stroke [facility])     Patient Active Problem List   Diagnosis Date Noted  . COVID-19 08/08/2019  . Acute respiratory failure with hypoxemia ([place]) 08/08/2019  . History of CVA (cerebrovascular accident) 08/08/2019  . Anemia 08/08/2019  . CKD (chronic kidney disease), stage III ([place]) 08/08/2019  . Dehydration   . Vitamin B12 deficiency 02/10/2019  . Unsteady gait 02/10/2019  . Ischemic stroke ([place]) 12/06/2017  . Diabetic polyneuropathy associated with type 2 diabetes mellitus ([place]) 12/06/2017    Past Surgical  History:  Procedure Laterality Date  . BREAST LUMPECTOMY Left   . CESAREAN SECTION       OB History   No obstetric history on file.     No family history on file.  Social History   Tobacco Use  . Smoking status: Former Smoker    Quit date: 1969    Years since quitting: 52.9  . Smokeless tobacco: Never Used  Vaping Use  . Vaping Use: Never used  Substance Use Topics  . Alcohol use: Not Currently  . Drug use: Never    Home Medications Prior to Admission medications   Medication Sig Start Date End Date Taking? Authorizing Provider  acetaminophen (TYLENOL) 500 MG tablet Take 1,000 mg by mouth at bedtime.    [provider]  albuterol (VENTOLIN HFA) 108 (90 Base) MCG/ACT inhaler Inhale 2 puffs into the lungs every 6 (six) hours as needed for wheezing.    [provider]  Ascorbic Acid (VITAMIN C PO) Take 500 mg by mouth daily.     [provider]  atorvastatin (LIPITOR) 40 MG tablet Take 40 mg by mouth every evening.     [provider]  beclomethasone (QVAR) 40 MCG/ACT inhaler Inhale 1 puff into the lungs 2 (two) times daily.     [provider]  cetirizine (ZYRTEC) 5 MG tablet Take 5 mg by mouth daily at 12 noon.    [provider]  cholecalciferol (VITAMIN D) 1000  units tablet Take 1,000 Units by mouth daily.     [provider]  clopidogrel (PLAVIX) 75 MG tablet Take 75 mg by mouth daily.     [provider]  colestipol (COLESTID) 1 g tablet Take by mouth. 04/27/20   [provider]  Cyanocobalamin ([employer] B12 QUICK DISSOLVE) 500 MCG LOZG Take 500 mcg by mouth daily.    [provider]  ferrous sulfate 325 (65 FE) MG tablet Take 325 mg by mouth 2 (two) times daily with a meal.     [provider]  gabapentin (NEURONTIN) 300 MG capsule Take 300 mg by mouth at bedtime.  11/26/17   [provider]  hydrochlorothiazide (HYDRODIURIL) 25 MG tablet Take 25 mg by mouth daily.   11/26/17   [provider]  losartan (COZAAR) 50 MG tablet Take 100 mg by mouth daily.     [provider]  MAGNESIUM PO Take 250 mg by mouth daily.     [provider]  megestrol (MEGACE) 40 MG tablet Take by mouth. 06/04/20   [provider]  Melatonin 10 MG TABS Take 10 mg by mouth every evening.    [provider]  metFORMIN (GLUCOPHAGE) 1000 MG tablet Take 1,000 mg by mouth 2 (two) times daily with a meal. Breakfast and supper    [provider]  mirtazapine (REMERON) 15 MG tablet Take 15 mg by mouth every evening.  04/25/18   [provider]  montelukast (SINGULAIR) 10 MG tablet Take 10 mg by mouth daily at 12 noon.     [provider]  Omega-3 1000 MG CAPS Take 1,000 mg by mouth daily.     [provider]  pantoprazole (PROTONIX) 40 MG tablet Take 40 mg by mouth daily.     [provider]  sertraline (ZOLOFT) 100 MG tablet Take 100 mg by mouth daily.  02/18/18   [provider]  zinc sulfate 220 (50 Zn) MG capsule Take 1 capsule (220 mg total) by mouth daily. Patient not taking: Reported on 02/13/2020 08/11/19   Georgette Shell, MD    Allergies    Patient has no known allergies.  Review of Systems   Review of Systems  Constitutional: Negative for fever.  HENT: Negative for sore throat.   Eyes: Negative for visual disturbance.  Respiratory: Negative for shortness of breath.   Cardiovascular: Positive for leg swelling. Negative for chest pain.  Gastrointestinal: Negative for abdominal pain.  Genitourinary: Negative for dysuria.  Musculoskeletal: Positive for extremity weakness. Negative for neck pain.  Skin: Negative for rash.  Neurological: Negative for headaches.    Physical Exam Updated Vital Signs BP 121/72 (BP Location: Right Arm)   Pulse 88   Temp 98.2 F (36.8 C) (Oral)   Resp 20   Ht 5\' 5"  (1.651 m)   Wt 64 kg   SpO2 100%   BMI 23.46 kg/m   Physical Exam Vitals  and nursing note reviewed.  Constitutional:      General: She is not in acute distress.    Appearance: Normal appearance. She is well-developed and well-nourished.  HENT:     Head: Normocephalic and atraumatic.  Eyes:     Conjunctiva/sclera: Conjunctivae normal.  Cardiovascular:     Rate and Rhythm: Normal rate and regular rhythm.     Heart sounds: No murmur heard.   Pulmonary:     Effort: Pulmonary effort is normal. No respiratory distress.     Breath sounds: Normal breath sounds.  Abdominal:     Palpations: Abdomen is soft.     Tenderness: There is no abdominal tenderness.  Musculoskeletal:        General: No tenderness.     Cervical back: Neck supple.     Right lower leg: Edema present.     Left lower leg: Edema present.  Skin:    General: Skin is warm and dry.  Neurological:     Mental Status: She is alert.     Cranial Nerves: No cranial nerve deficit.     Sensory: No sensory deficit.     Motor: Weakness present.     Comments: No appreciable weakness other than right upper extremity.  She has 5 out of 5 strength biceps triceps.  She has 2 out of 5 strength wrist extension and flexion and weakness of interosseous muscles.  Grip strength is fairly good.  Psychiatric:        Mood and Affect: Mood and affect normal.     ED Results / Procedures / Treatments   Labs (all labs ordered are listed, but only abnormal results are displayed) Labs Reviewed  BASIC METABOLIC PANEL - Abnormal; Notable for the following components:      Result Value   Glucose, Bld 160 (*)    Creatinine, Ser 1.75 (*)    GFR, Estimated 29 (*)    All other components within normal limits  CBC - Abnormal; Notable for the following components:   RBC 2.49 (*)    Hemoglobin 7.7 (*)    HCT 24.3 (*)    All other components within normal limits    EKG EKG Interpretation  Date/Time:  Friday June 25 2020 15:56:51 EST Ventricular Rate:  76 PR Interval:    QRS Duration: 131 QT Interval:  455 QTC  Calculation: 512 R Axis:   -10 Text Interpretation: Sinus rhythm Atrial premature complex Left bundle branch block No significant change since prior today Confirmed by Aletta Edouard [phone]) on 06/25/2020 4:16:47 PM   Radiology MR ANGIO HEAD WO CONTRAST  Result Date: 06/25/2020 CLINICAL DATA:  Fatigue EXAM: MRA HEAD WITHOUT CONTRAST TECHNIQUE: Angiographic images of the Circle of Willis were obtained using MRA technique without intravenous contrast. COMPARISON:  Head CT 06/07/2020 FINDINGS: Both internal carotid arteries are widely patent into the brain. No siphon stenosis. The anterior and middle cerebral vessels are patent without proximal stenosis, aneurysm or vascular malformation. Both vertebral arteries are widely patent through the foramen magnum. The left is small and terminates in PICA. The right is large and supplies the basilar. No basilar stenosis. Posterior circulation branch vessels are patent. There is some atherosclerotic narrowing in the PCA branches, right more than left. IMPRESSION: No intracranial large or medium vessel occlusion or correctable proximal stenosis. Some atherosclerotic narrowing of the PCA branches, right more than left. Electronically Signed   By: Rains Chimes M.D.   On: 06/25/2020 20:45   MR BRAIN WO CONTRAST  Result Date: 06/25/2020 CLINICAL DATA:  Neurological deficit.  Acute stroke suspected. EXAM: MRI HEAD WITHOUT CONTRAST TECHNIQUE: Multiplanar, multiecho pulse sequences of the brain and surrounding structures were obtained without intravenous contrast. COMPARISON:  Head CT 06/07/2020.  MR angiography earlier today. FINDINGS: Brain: Diffusion imaging does not show any acute or subacute infarction. Chronic small-vessel ischemic changes affect pons. Mild to moderate chronic small-vessel ischemic changes are noted within the thalami and cerebral hemispheric white matter. No cortical or large vessel territory infarction. No mass lesion, hemorrhage, hydrocephalus  or extra-axial collection Vascular: Major vessels  at the base of the brain show flow. Skull and upper cervical spine: Negative Sinuses/Orbits: Clear/normal Other: None IMPRESSION: No acute finding by MRI. Chronic small-vessel ischemic changes throughout the brain as outlined above. Electronically Signed   By: Burruss Chimes M.D.   On: 06/25/2020 20:52    Procedures Procedures (including critical care time)  Medications Ordered in ED Medications - No data to display  ED Course  I have reviewed the triage vital signs and the nursing notes.  Pertinent labs & imaging results that were available during my care of the patient were reviewed by me and considered in my medical decision making (see chart for details).  Clinical Course as of 06/25/20 2307  Fri Jun 25, 2020  1629 Discussed with neurology Dr. Curly Shores.  She is recommending an MRI brain and an MRI without contrast and will have the team evaluate the patient.  I updated the patient and her daughter. [MB]  1902 Patient's labs show that she is more anemic than baseline.  She says they have identified this at her facility and are working her up for this.  Her creatinine is also elevated although better than the most recent.  It sounds like they stopped her Metformin and have been watching this. [MB]  2004 Patient on her way over to MRI. [MB]    Clinical Course User Index [MB] Hayden Rasmussen, MD   MDM Rules/Calculators/A&P                         This patient complains of right hand clumsiness weakness; this involves an extensive number of treatment Options and is a complaint that carries with it a high risk of complications and Morbidity. The differential includes stroke, peripheral nerve injury, radiculopathy, metabolic derangement, hypoglycemia  I ordered, reviewed and interpreted labs, which included CBC with normal white count, hemoglobin low and trending down from priors, chemistries with elevated glucose, elevated creatinine better  than most recent  I ordered imaging studies which included MRI/MRA brain and I independently    visualized and interpreted imaging which showed no acute findings to explain patient's symptoms Additional history obtained from patient's daughter Previous records obtained and reviewed in epic, no recent admissions I consulted Dr. Cheral Marker neuro hospitalist and discussed lab and imaging findings  Critical Interventions: None  After the interventions stated above, I reevaluated the patient and found patient still to be symptomatic with right hand weakness.  As far as her anemia goes she does not endorse any melena or obvious bleeding.  Her doctors are aware of this and are working it up.  Same with her worsened renal function.  Patient has an outpatient neurologist and recommendations from Dr. Cheral Marker around that she follow-up with Dr. Posey Pronto to further work this up.  Patient and daughter are comfortable plan.  Return instructions discussed.   Final Clinical Impression(s) / ED Diagnoses Final diagnoses:  Right hand weakness  Anemia, unspecified type  AKI (acute kidney injury) [facility])    Rx / DC Orders ED Discharge Orders    None       Hayden Rasmussen, MD 06/25/20 2310

## 2020-06-25 NOTE — ED Notes (Signed)
Patient transported to MRI 

## 2020-06-25 NOTE — Consult Note (Signed)
NEURO HOSPITALIST CONSULT NOTE   Requestig physician: Dr. Melina Copa  Reason for Consult: Right hand with uncontrollable movements  History obtained from:  Patient, Daughter and Chart     HPI:                                                                                                                                          Teresa Daniels is an 79 y.o. female with a prior history of stroke with residual right sided weakness, DM and HTN who presents to the ED with weakness and involuntary movements or her right hand since she woke up this morning. She initially thought that she might have slept on her arm wrong, resulting in her hand falling asleep, but symptoms did not improve. She denies associated confusion, aphasia, dysarthria, facial droop, more proximal upper extremity weakness, new RLE weakness, vision loss, CP, headache, SOB or abdominal pain.   On Neurologist arrival at the bedside, her right hand exhibits choreoathetotic flailing movements originating at the wrist, with milder spontaneous dystonic extensor posturing of the digits of her right hand. Occasionally with more proximal elbow movements, but these are relatively mild. When asked about this, she states that since this morning her right hand has been moving "as though it had a life of its own".   Past Medical History:  Diagnosis Date  . Asthma   . Diabetes mellitus without complication ([place])   . Hypertension   . Stroke [facility])     Past Surgical History:  Procedure Laterality Date  . BREAST LUMPECTOMY Left   . CESAREAN SECTION      No family history on file.            Social History:  reports that she quit smoking about 52 years ago. She has never used smokeless tobacco. She reports previous alcohol use. She reports that she does not use drugs.  No Known Allergies  HOME MEDICATIONS:                                                                                                                       No current facility-administered medications on file prior to encounter.   Current Outpatient Medications on File Prior to Encounter  Medication Sig Dispense Refill  . acetaminophen (TYLENOL)  500 MG tablet Take 1,000 mg by mouth at bedtime.    Marland Kitchen albuterol (VENTOLIN HFA) 108 (90 Base) MCG/ACT inhaler Inhale 2 puffs into the lungs every 6 (six) hours as needed for wheezing.    . Ascorbic Acid (VITAMIN C PO) Take 500 mg by mouth daily.     Marland Kitchen atorvastatin (LIPITOR) 40 MG tablet Take 40 mg by mouth every evening.     . beclomethasone (QVAR) 40 MCG/ACT inhaler Inhale 1 puff into the lungs 2 (two) times daily.     . cetirizine (ZYRTEC) 5 MG tablet Take 5 mg by mouth daily at 12 noon.    . cholecalciferol (VITAMIN D) 1000 units tablet Take 1,000 Units by mouth daily.     . clopidogrel (PLAVIX) 75 MG tablet Take 75 mg by mouth daily.     . colestipol (COLESTID) 1 g tablet Take by mouth.    . Cyanocobalamin ([employer]S B12 QUICK DISSOLVE) 500 MCG LOZG Take 500 mcg by mouth daily.    . ferrous sulfate 325 (65 FE) MG tablet Take 325 mg by mouth 2 (two) times daily with a meal.     . gabapentin (NEURONTIN) 300 MG capsule Take 300 mg by mouth at bedtime.     . hydrochlorothiazide (HYDRODIURIL) 25 MG tablet Take 25 mg by mouth daily.     Marland Kitchen losartan (COZAAR) 50 MG tablet Take 100 mg by mouth daily.     Marland Kitchen MAGNESIUM PO Take 250 mg by mouth daily.     . megestrol (MEGACE) 40 MG tablet Take by mouth.    . Melatonin 10 MG TABS Take 10 mg by mouth every evening.    . metFORMIN (GLUCOPHAGE) 1000 MG tablet Take 1,000 mg by mouth 2 (two) times daily with a meal. Breakfast and supper    . mirtazapine (REMERON) 15 MG tablet Take 15 mg by mouth every evening.     . montelukast (SINGULAIR) 10 MG tablet Take 10 mg by mouth daily at 12 noon.     . Omega-3 1000 MG CAPS Take 1,000 mg by mouth daily.     . pantoprazole (PROTONIX) 40 MG tablet Take 40 mg by mouth daily.     . sertraline (ZOLOFT) 100 MG tablet Take 100 mg by  mouth daily.     Marland Kitchen zinc sulfate 220 (50 Zn) MG capsule Take 1 capsule (220 mg total) by mouth daily. (Patient not taking: Reported on 02/13/2020)       ROS:                                                                                                                                       As per HPI.    Blood pressure (!) 152/68, pulse 85, temperature 98.2 F (36.8 C), temperature source Oral, resp. rate 14, height 5\' 5"  (1.651 m), weight 64 kg, SpO2 100 %.   General Examination:  Physical Exam  HEENT-  [place]/AT   Lungs-Respirations unlabored Extremities- Pretibial and foot edema bilaterally   Neurological Examination Mental Status: Awake and alert. Fully oriented. Speech fluent with intact comprehension and naming. Odd affect with occasional easily triggered laughing that may be due to her baseline personality. Cranial Nerves: II: Visual fields intact without extinction to DSS. PERRL.  III,IV, VI: No ptosis. EOMI. No nystagmus.  V,VII: Smile symmetric, facial temp sensation equal bilaterally VIII: HOH IX,X: Palate rises symmetrically XI: Symmetric shoulder shrug XII: Midline tongue extension Motor: RUE: 5/5 strength proximally with 4+/5 wrist flexion, wrist extension and grip. Spontaneous choreoform movements at her wrist interfere with testing of finger extension and abduction, which are not definitively weak - patient is unable to consistently initiate these movements for testing. When asked to alternately tap each of digits 2-5 to her thumb, she has significant discoordination and slowing of movement. Mirror movements of her left hand occur when attempting alternate finger tapping.  LUE: 5/5 proximal and distal strength. Normal coordination when testing alternate finger tapping.  BLE: 5/5 proximally with 4+/5 ADF and APF Sensory: Proximal limb temp and FT sensation is normal. Some  decreased sensation distal BLE below knees in the context of known peripheral neuropathy and edema Deep Tendon Reflexes: 2+ and symmetric bilateral biceps and brachioradialis. Trace patellar reflexes. 0 achilles.  Plantars: Mute bilaterally  Cerebellar: No ataxia with FNF bilaterally  Gait: Deferred   Lab Results: Basic Metabolic Panel: Recent Labs  Lab 06/25/20 1122  NA 140  K 3.5  CL 103  CO2 26  GLUCOSE 160*  BUN 20  CREATININE 1.75*  CALCIUM 9.0    CBC: Recent Labs  Lab 06/25/20 1122  WBC 7.0  HGB 7.7*  HCT 24.3*  MCV 97.6  PLT 272    Cardiac Enzymes: No results for input(s): CKTOTAL, CKMB, CKMBINDEX, TROPONINI in the last 168 hours.  Lipid Panel: No results for input(s): CHOL, TRIG, HDL, CHOLHDL, VLDL, LDLCALC in the last 168 hours.  Imaging: MR ANGIO HEAD WO CONTRAST  Result Date: 06/25/2020 CLINICAL DATA:  Fatigue EXAM: MRA HEAD WITHOUT CONTRAST TECHNIQUE: Angiographic images of the Circle of Willis were obtained using MRA technique without intravenous contrast. COMPARISON:  Head CT 06/07/2020 FINDINGS: Both internal carotid arteries are widely patent into the brain. No siphon stenosis. The anterior and middle cerebral vessels are patent without proximal stenosis, aneurysm or vascular malformation. Both vertebral arteries are widely patent through the foramen magnum. The left is small and terminates in PICA. The right is large and supplies the basilar. No basilar stenosis. Posterior circulation branch vessels are patent. There is some atherosclerotic narrowing in the PCA branches, right more than left. IMPRESSION: No intracranial large or medium vessel occlusion or correctable proximal stenosis. Some atherosclerotic narrowing of the PCA branches, right more than left. Electronically Signed   By: Twedt Chimes M.D.   On: 06/25/2020 20:45   MR BRAIN WO CONTRAST  Result Date: 06/25/2020 CLINICAL DATA:  Neurological deficit.  Acute stroke suspected. EXAM: MRI HEAD  WITHOUT CONTRAST TECHNIQUE: Multiplanar, multiecho pulse sequences of the brain and surrounding structures were obtained without intravenous contrast. COMPARISON:  Head CT 06/07/2020.  MR angiography earlier today. FINDINGS: Brain: Diffusion imaging does not show any acute or subacute infarction. Chronic small-vessel ischemic changes affect pons. Mild to moderate chronic small-vessel ischemic changes are noted within the thalami and cerebral hemispheric white matter. No cortical or large vessel territory infarction. No mass lesion, hemorrhage, hydrocephalus or extra-axial collection Vascular: Major  vessels at the base of the brain show flow. Skull and upper cervical spine: Negative Sinuses/Orbits: Clear/normal Other: None IMPRESSION: No acute finding by MRI. Chronic small-vessel ischemic changes throughout the brain as outlined above. Electronically Signed   By: Tarman Chimes M.D.   On: 06/25/2020 20:52    Assessment: 79 y.o. female with a prior history of stroke with residual right sided weakness, DM and HTN who presents to the ED with weakness and involuntary movements or her right hand since she woke up this morning.  On exam, her right hand exhibits choreoathetotic flailing movements originating at the wrist, with milder spontaneous dystonic extensor posturing of the digits of her right hand. Occasionally with more proximal elbow movements, but these are relatively mild. When asked about this, she states that since this morning her right hand has been moving "as though it had a life of its own". 1. Exam also reveals discoordination of right digits when attempting alternate finger tapping. 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 MRI brain has ruled out an acute stroke.  2. MRI brain: No acute finding by MRI. Chronic small-vessel ischemic changes throughout the brain as outlined above.and  plan per attending neurologist  Recommendations: 1. Outpatient Neurology follow up (Dr. 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  2. May benefit from trial off mirtazapine as this medication is rarely associated with dyskinesia. Sertraline also may have similar side effects.   Electronically signed: Dr. Kerney Elbe 06/25/2020, 9:07 PM

## 2020-06-25 NOTE — ED Triage Notes (Signed)
Pt bib ems from [place] with reports of weakness to R hand when she woke up today. Some swelling to hands noted. No arm drift noted. 1800 yesterday LKW. No facial droop. Pt with increased falls over the last few weeks. EKG with LBBB. 20g LAC.

## 2020-06-29 ENCOUNTER — Telehealth: Payer: Self-pay | Admitting: Neurology

## 2020-06-29 NOTE — Telephone Encounter (Signed)
Patient's daughter called in to make an appointment. She is scheduled for 08/02/20. She wanted me to send a note to Dr. Posey Pronto to take a look at her ED visit notes from 06/25/20. The patient woke up with her hand shaped like a claw. They went to the ED to make sure she hadn't had a stroke. She stated the doctor's did not seem to really know what was going on and suggested she make an appointment with Dr. Posey Pronto. The patient's daughter just wants to make sure there isn't something serious that can't wait until 08/02/20.

## 2020-06-29 NOTE — Telephone Encounter (Signed)
OK to move to 1/13 at 10:50a and wait list.

## 2020-07-12 ENCOUNTER — Ambulatory Visit (INDEPENDENT_AMBULATORY_CARE_PROVIDER_SITE_OTHER): Payer: Medicare PPO | Admitting: Neurology

## 2020-07-12 ENCOUNTER — Encounter: Payer: Self-pay | Admitting: Neurology

## 2020-07-12 ENCOUNTER — Other Ambulatory Visit: Payer: Self-pay

## 2020-07-12 VITALS — BP 155/83 | HR 92 | Ht 64.0 in | Wt 146.0 lb

## 2020-07-12 DIAGNOSIS — M21331 Wrist drop, right wrist: Secondary | ICD-10-CM

## 2020-07-12 DIAGNOSIS — E1142 Type 2 diabetes mellitus with diabetic polyneuropathy: Secondary | ICD-10-CM

## 2020-07-12 DIAGNOSIS — I639 Cerebral infarction, unspecified: Secondary | ICD-10-CM

## 2020-07-12 DIAGNOSIS — R2681 Unsteadiness on feet: Secondary | ICD-10-CM | POA: Diagnosis not present

## 2020-07-12 NOTE — Patient Instructions (Addendum)
Please call my office if you would like to proceed with nerve testing  Continue occupational therapy  Return to clinic in 2-3 months

## 2020-07-12 NOTE — Progress Notes (Signed)
Follow-up Visit   Date: 07/12/20    Teresa Daniels MRN: [id number] DOB: February 27, 1941   Interim History: Teresa Daniels is a 80 y.o. 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  The patient was accompanied to the clinic by daughter who also provides collateral information.    History of present illness: In April 2019, patient presented to the ER with 2 day history of right sided weakness and falls. MRI showed left subcortical infarct. There was no evidence of large vessel occlusion. Surface echocardiogram did not show a cardioembolic source. She was previously taking aspirin and Zocor at home.  With her new stroke, she was started on dual antiplatelet therapy with aspirin 81 and Plavix 75 mg daily.  His she was discharged to [facility] where she was admitted from 4/30 through 11/22/2017.  She was discharged to rehab facility where she was in a wheelchair and gradually made some improvement of her right arm and leg strength.  Over the following months, strength returned to the right side, but she continues to have imbalance and falls.   She has long history of diabetic neuropathy of the feet and takes gabapentin 353m at bedtime.  UPDATE 07/12/2020:  During the month of November, she was having generalized fatigue, lack of appetite, falling, and overall feeling of being unwell.  She went to the ER and had evaluation by her PCP for infection and completed a course of antibiotics and PT/OT.  She still did not feel well and ultimately thought that symptoms were due to dehydration.  After starting Megace, her appetite improved and her fatigue has also improved.  On 12/17, she woke up with new onset right hand weakness, being unable to extend the fingers or wrist.  She went to the ER where MRI/A brain did not show  any acute findings. Over the past two weeks, her symptoms have improved.     Medications:  Current Outpatient Medications on File Prior to Visit  Medication Sig Dispense Refill  . acetaminophen (TYLENOL) 500 MG tablet Take 1,000 mg by mouth at bedtime.    .Marland Kitchenalbuterol (VENTOLIN HFA) 108 (90 Base) MCG/ACT inhaler Inhale 2 puffs into the lungs every 6 (six) hours as needed for wheezing.    . Ascorbic Acid (VITAMIN C PO) Take 500 mg by mouth daily.     .Marland Kitchenatorvastatin (LIPITOR) 40 MG tablet Take 40 mg by mouth every evening.     . beclomethasone (QVAR) 40 MCG/ACT inhaler Inhale 1 puff into the lungs 2 (two) times daily.     . cetirizine (ZYRTEC) 5 MG tablet Take 5 mg by mouth daily at 12 noon.    . cholecalciferol (VITAMIN D) 1000 units tablet Take 1,000 Units by mouth daily.     . clopidogrel (PLAVIX) 75 MG tablet Take 75 mg by mouth daily.     . Cyanocobalamin ([employer]S B12 QUICK DISSOLVE) 500 MCG LOZG Take 500 mcg by mouth daily.    . ferrous sulfate 325 (65 FE) MG tablet Take 325 mg by mouth 2 (two) times daily with a meal.     . gabapentin (NEURONTIN) 300 MG capsule Take 300 mg by mouth at bedtime.     . hydrochlorothiazide (HYDRODIURIL) 25 MG tablet Take 25 mg by mouth daily.     .Marland Kitchenlosartan (COZAAR) 50 MG tablet Take 50 mg by mouth daily. One tablet daily    .  MAGNESIUM PO Take 250 mg by mouth daily.     . mirtazapine (REMERON) 15 MG tablet Take 15 mg by mouth every evening.     . montelukast (SINGULAIR) 10 MG tablet Take 10 mg by mouth daily at 12 noon.     . Omega-3 1000 MG CAPS Take 1,000 mg by mouth daily.     . pantoprazole (PROTONIX) 40 MG tablet Take 40 mg by mouth daily.     . sertraline (ZOLOFT) 100 MG tablet Take 100 mg by mouth daily.     . colestipol (COLESTID) 1 g tablet Take by mouth. (Patient not taking: Reported on 07/12/2020)    . megestrol (MEGACE) 40 MG tablet Take by mouth. (Patient not taking: Reported on 07/12/2020)    . Melatonin 10 MG TABS Take 10 mg by mouth every evening.  (Patient not taking: Reported on 07/12/2020)    . metFORMIN (GLUCOPHAGE) 1000 MG tablet Take 1,000 mg by mouth 2 (two) times daily with a meal. Breakfast and supper (Patient not taking: Reported on 07/12/2020)    . zinc sulfate 220 (50 Zn) MG capsule Take 1 capsule (220 mg total) by mouth daily. (Patient not taking: No sig reported)     No current facility-administered medications on file prior to visit.    Allergies: No Known Allergies  Vital Signs:  BP (!) 155/83   Pulse 92   Ht 5' 4"  (1.626 m)   Wt 146 lb (66.2 kg)   SpO2 98%   BMI 25.06 kg/m   Neurological Exam: MENTAL STATUS including orientation to time, place, person, recent and remote memory, attention span and concentration, language, and fund of knowledge is normal.  Speech is not slightly hoarse, not dysarthric.  CRANIAL NERVES:   Pupils equal round and reactive to light.  Normal conjugate, extra-ocular eye movements in all directions of gaze.  No ptosis.   MOTOR:  No atrophy, fasciculations or abnormal movements.  No pronator drift.  Tone is normal.    Right Upper Extremity:    Left Upper Extremity:    Deltoid  5/5   Deltoid  5/5   Biceps  5/5   Biceps  5/5   Triceps  5/5   Triceps  5/5   Wrist extensors  5/5   Wrist extensors  5/5   Wrist flexors  5/5   Wrist flexors  5/5   Finger extensors  4/5   Finger extensors  5/5   Finger flexors  5/5   Finger flexors  5/5   Dorsal interossei  4/5   Dorsal interossei  5-/5   Abductor pollicis  5/5   Abductor pollicis  5/5   Tone (Ashworth scale)  0  Tone (Ashworth scale)  0   Right Lower Extremity:    Left Lower Extremity:    Hip flexors  5-/5   Hip flexors  5/5   Hip extensors  5/5   Hip extensors  5/5   Knee flexors  5/5   Knee flexors  5/5   Knee extensors  5/5   Knee extensors  5/5   Dorsiflexors  5/5   Dorsiflexors  5/5   Plantarflexors  5/5   Plantarflexors  5/5   Toe extensors  4/5   Toe extensors  4/5   Toe flexors  4/5   Toe flexors  4/5   Tone (Ashworth scale)  0   Tone (Ashworth scale)  0   MSRs:  Reflexes are 2+/4 in the upper extremities, 1+ bilateral patella, and  absent at the ankles.  SENSORY: Vibration is absent distal to knees bilaterally.   Sensation is intact in the arms.   COORDINATION/GAIT:  Mild dysmetria with finger-to-nose testing on the right.  Intact rapid alternating movements bilaterally.  Gait is mildly wide-based, ataxia when unassisted, more stable with a walker.   Data: Labs 5/202019:  HbA1c 6.4 Labs 03/18/2018: TSH 1.130 Labs 04/05/2018 ESR 21, CRP, folate 14.9, vitamin B12 239, copper 95, vitamin E 6.6  Labs 06/11/2017: TSH 1.4, hemoglobin A1c 6.3, LDL 78, cholesterol 151 triglycerides 142 HDL 47, complete metabolic panel all within normal limits.  Diagnostic studies April 2019 TTE: EF is mildly reduced at 30-05%, grade 1 diastolic dysfunction. Right ventricle appears normal. Left atrium is mildly enlarged, right atrium is normal. Trivial mitral regurgitation and mild tricuspid regurgitation. CT angiogram head and neck: Mild intracranial atherosclerotic disease. Mild calcification and right carotid bulb, less than 50% severity. MRI brain: Acute nonhemorrhagic cerebrovascular accident along the left putamen and left corona radiate.  30-day cardiac monitor - normal  MRI lumbar spine 04/17/2018: Mild edema in the left sacrum, possibly due to recent fracture.  Correlate with pain in this area. MRI of the pelvis with attention to the sacrum could be performed for confirmation depending on the history and physical findings. Mild to moderate spinal stenosis L3-4 with mild subarticular stenosis bilaterally. Grade 1 anterolisthesis L4-5. Mild to moderate spinal stenosis with moderate subarticular stenosis on the left. Mild degenerative changes L5-S1 with mild subarticular stenosis bilaterally.  MRI brain wo contrast 06/25/2020: No acute finding by MRI. Chronic small-vessel ischemic changes throughout the brain as outlined above.  MRA  head 06/25/2020: No intracranial large or medium vessel occlusion or correctable proximal stenosis. Some atherosclerotic narrowing of the PCA branches, right more than left.   IMPRESSION/PLAN: 1.  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  She does not wish to proceed with testing at this time  - Continue occupational therapy  - Monitor for signs of weakness   - Avoid compression to the nerve  2. 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   - Continue PT  - Compliant with using a walker  3.  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   - Continue plavix 768mdaily and lipitor 4023maily, prescribed by PCP  Return to clinic in 3 months  Total time spent reviewing records, interview, history/exam, documentation, counseling, and coordination of care on day of encounter:  30 min     Thank you for allowing me to participate in patient's care.  If I can answer any additional questions, I would be pleased to do so.    Sincerely,    Alenah Sarria K. PatPosey ProntoO

## 2020-07-22 ENCOUNTER — Ambulatory Visit: Payer: Medicare PPO | Admitting: Neurology

## 2020-08-02 ENCOUNTER — Ambulatory Visit: Payer: Medicare PPO | Admitting: Neurology

## 2020-09-20 ENCOUNTER — Ambulatory Visit: Payer: Medicare PPO | Admitting: Neurology

## 2020-09-28 ENCOUNTER — Other Ambulatory Visit: Payer: Self-pay | Admitting: Physician Assistant

## 2020-09-28 DIAGNOSIS — Z1231 Encounter for screening mammogram for malignant neoplasm of breast: Secondary | ICD-10-CM

## 2020-09-30 ENCOUNTER — Other Ambulatory Visit: Payer: Self-pay | Admitting: Physician Assistant

## 2020-09-30 DIAGNOSIS — E2839 Other primary ovarian failure: Secondary | ICD-10-CM

## 2020-09-30 NOTE — Progress Notes (Signed)
Follow-up Visit   Date: 09/30/20    Teresa Daniels MRN: [id number] DOB: 1940/08/04   Interim History: Teresa Daniels is a 80 y.o. 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  The patient was accompanied to the clinic by daughter who also provides collateral information.    History of present illness: In April 2019, patient presented to the ER with 2 day history of right sided weakness and falls. MRI showed left subcortical infarct. There was no evidence of large vessel occlusion. Surface echocardiogram did not show a cardioembolic source. She was previously taking aspirin and Zocor at home.  With her new stroke, she was started on dual antiplatelet therapy with aspirin 81 and Plavix 75 mg daily.  His she was discharged to [facility] where she was admitted from 4/30 through 11/22/2017.  She was discharged to rehab facility where she was in a wheelchair and gradually made some improvement of her right arm and leg strength.  Over the following months, strength returned to the right side, but she continues to have imbalance and falls.   She has long history of diabetic neuropathy of the feet and takes gabapentin 352m at bedtime.  UPDATE 07/12/2020:  During the month of November, she was having generalized fatigue, lack of appetite, falling, and overall feeling of being unwell.  She went to the ER and had evaluation by her PCP for infection and completed a course of antibiotics and PT/OT.  She still did not feel well and ultimately thought that symptoms were due to dehydration.  After starting Megace, her appetite improved and her fatigue has also improved.  On 12/17, she woke up with new onset right hand weakness, being unable to extend the fingers or wrist.  She went to the ER where MRI/A brain did not show any acute  findings. Over the past two weeks, her symptoms have improved.    UPDATE 09/30/2020:  She is here for follow-up visit. Right hand weakness has significantly improved with OT.  She almost feels that her hand is back to baseline.  She occasionally has some discomfort in the palm of the hand.  No tingling.  She was discharged from PT yesterday.  She is careful with walking and continues to have imbalance.  She is compliant with using her rollator.   Medications:  Current Outpatient Medications on File Prior to Visit  Medication Sig Dispense Refill  . acetaminophen (TYLENOL) 500 MG tablet Take 1,000 mg by mouth at bedtime.    .Marland Kitchenalbuterol (VENTOLIN HFA) 108 (90 Base) MCG/ACT inhaler Inhale 2 puffs into the lungs every 6 (six) hours as needed for wheezing.    . Ascorbic Acid (VITAMIN C PO) Take 500 mg by mouth daily.     .Marland Kitchenatorvastatin (LIPITOR) 40 MG tablet Take 40 mg by mouth every evening.     . beclomethasone (QVAR) 40 MCG/ACT inhaler Inhale 1 puff into the lungs 2 (two) times daily.     . cetirizine (ZYRTEC) 5 MG tablet Take 5 mg by mouth daily at 12 noon.    . cholecalciferol (VITAMIN D) 1000 units tablet Take 1,000 Units by mouth daily.     . clopidogrel (PLAVIX) 75 MG tablet Take 75 mg by mouth daily.     . colestipol (COLESTID) 1 g tablet Take by mouth. (Patient not taking: Reported on 07/12/2020)    . Cyanocobalamin (CVS  B12 QUICK DISSOLVE) 500 MCG LOZG Take 500 mcg by mouth daily.    . ferrous sulfate 325 (65 FE) MG tablet Take 325 mg by mouth 2 (two) times daily with a meal.     . gabapentin (NEURONTIN) 300 MG capsule Take 300 mg by mouth at bedtime.     . hydrochlorothiazide (HYDRODIURIL) 25 MG tablet Take 25 mg by mouth daily.     Marland Kitchen losartan (COZAAR) 50 MG tablet Take 50 mg by mouth daily. One tablet daily    . MAGNESIUM PO Take 250 mg by mouth daily.     . megestrol (MEGACE) 40 MG tablet Take by mouth. (Patient not taking: Reported on 07/12/2020)    . Melatonin 10 MG TABS Take 10 mg by  mouth every evening. (Patient not taking: Reported on 07/12/2020)    . metFORMIN (GLUCOPHAGE) 1000 MG tablet Take 1,000 mg by mouth 2 (two) times daily with a meal. Breakfast and supper (Patient not taking: Reported on 07/12/2020)    . mirtazapine (REMERON) 15 MG tablet Take 15 mg by mouth every evening.     . montelukast (SINGULAIR) 10 MG tablet Take 10 mg by mouth daily at 12 noon.     . Omega-3 1000 MG CAPS Take 1,000 mg by mouth daily.     . pantoprazole (PROTONIX) 40 MG tablet Take 40 mg by mouth daily.     . sertraline (ZOLOFT) 100 MG tablet Take 100 mg by mouth daily.     Marland Kitchen zinc sulfate 220 (50 Zn) MG capsule Take 1 capsule (220 mg total) by mouth daily. (Patient not taking: No sig reported)     No current facility-administered medications on file prior to visit.    Allergies: No Known Allergies  Vital Signs:  There were no vitals taken for this visit.  Neurological Exam: MENTAL STATUS including orientation to time, place, person, recent and remote memory, attention span and concentration, language, and fund of knowledge is normal.  Speech is not slightly hoarse, not dysarthric.  CRANIAL NERVES:   Pupils equal round and reactive to light.  Normal conjugate, extra-ocular eye movements in all directions of gaze.  No ptosis.   MOTOR:  No atrophy, fasciculations or abnormal movements.  No pronator drift.  Tone is normal.    Right Upper Extremity:    Left Upper Extremity:    Deltoid  5/5   Deltoid  5/5   Biceps  5/5   Biceps  5/5   Triceps  5/5   Triceps  5/5   Wrist extensors  5/5   Wrist extensors  5/5   Wrist flexors  5/5   Wrist flexors  5/5   Finger extensors  5/5   Finger extensors  5/5   Finger flexors  5/5   Finger flexors  5/5   Dorsal interossei  5-/5   Dorsal interossei  5-/5   Abductor pollicis  5/5   Abductor pollicis  5/5   Tone (Ashworth scale)  0  Tone (Ashworth scale)  0   Right Lower Extremity:    Left Lower Extremity:    Hip flexors  5-/5   Hip flexors  5/5    Hip extensors  5/5   Hip extensors  5/5   Knee flexors  5/5   Knee flexors  5/5   Knee extensors  5/5   Knee extensors  5/5   Dorsiflexors  5/5   Dorsiflexors  5/5   Plantarflexors  5/5   Plantarflexors  5/5  Toe extensors  4/5   Toe extensors  4/5   Toe flexors  4/5   Toe flexors  4/5   Tone (Ashworth scale)  0  Tone (Ashworth scale)  0   MSRs:  Reflexes are 2+/4 in the upper extremities, 1+ bilateral patella, and absent at the ankles.  SENSORY: Vibration is absent distal to knees bilaterally.   Sensation is intact in the arms.   COORDINATION/GAIT:  Mild dysmetria with finger-to-nose testing on the right.  Intact rapid alternating movements bilaterally.  Gait is mildly wide-based, ataxia when unassisted, stable with a walker.   Data: Labs 5/202019:  HbA1c 6.4 Labs 03/18/2018: TSH 1.130 Labs 04/05/2018 ESR 21, CRP, folate 14.9, vitamin B12 239, copper 95, vitamin E 6.6  Labs 06/11/2017: TSH 1.4, hemoglobin A1c 6.3, LDL 78, cholesterol 151 triglycerides 142 HDL 47, complete metabolic panel all within normal limits.  Diagnostic studies April 2019 TTE: EF is mildly reduced at 81-10%, grade 1 diastolic dysfunction. Right ventricle appears normal. Left atrium is mildly enlarged, right atrium is normal. Trivial mitral regurgitation and mild tricuspid regurgitation. CT angiogram head and neck: Mild intracranial atherosclerotic disease. Mild calcification and right carotid bulb, less than 50% severity. MRI brain: Acute nonhemorrhagic cerebrovascular accident along the left putamen and left corona radiate.  30-day cardiac monitor - normal  MRI lumbar spine 04/17/2018: Mild edema in the left sacrum, possibly due to recent fracture.  Correlate with pain in this area. MRI of the pelvis with attention to the sacrum could be performed for confirmation depending on the history and physical findings. Mild to moderate spinal stenosis L3-4 with mild subarticular stenosis bilaterally. Grade 1  anterolisthesis L4-5. Mild to moderate spinal stenosis with moderate subarticular stenosis on the left. Mild degenerative changes L5-S1 with mild subarticular stenosis bilaterally.  MRI brain wo contrast 06/25/2020: No acute finding by MRI. Chronic small-vessel ischemic changes throughout the brain as outlined above.  MRA head 06/25/2020: No intracranial large or medium vessel occlusion or correctable proximal stenosis. Some atherosclerotic narrowing of the PCA branches, right more than left.   IMPRESSION/PLAN: 1.  Right wrist drop, likely due to radial nerve entrapment - resolved/  NCS/EMG not perform due to improving symptoms.  - Continue home OT exercises  - Strategies to minimize nerve compression discussed  2. 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   - Continue home exercises  - Always use walke  3.  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  4.  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   - Continue plavix 728mdaily and lipitor 4061maily, prescribed by PCP  Return to clinic in 6 months  Total time spent reviewing records, interview, history/exam, documentation, and coordination of care on day of encounter:  20 min     Thank you for allowing me to participate in patient's care.  If I can answer any additional questions, I would be pleased to do so.    Sincerely,    Rebbecca Osuna K. PatPosey ProntoO

## 2020-10-01 ENCOUNTER — Ambulatory Visit (INDEPENDENT_AMBULATORY_CARE_PROVIDER_SITE_OTHER): Payer: Medicare PPO | Admitting: Neurology

## 2020-10-01 ENCOUNTER — Other Ambulatory Visit: Payer: Self-pay

## 2020-10-01 ENCOUNTER — Encounter: Payer: Self-pay | Admitting: Neurology

## 2020-10-01 VITALS — BP 159/64 | HR 83 | Resp 18 | Ht 65.0 in | Wt 156.0 lb

## 2020-10-01 DIAGNOSIS — I639 Cerebral infarction, unspecified: Secondary | ICD-10-CM

## 2020-10-01 DIAGNOSIS — E1142 Type 2 diabetes mellitus with diabetic polyneuropathy: Secondary | ICD-10-CM

## 2020-10-01 DIAGNOSIS — M21331 Wrist drop, right wrist: Secondary | ICD-10-CM | POA: Diagnosis not present

## 2020-10-01 NOTE — Patient Instructions (Signed)
Continue home exercises  Always use your walker  Return to clinic in 6 months

## 2020-11-22 ENCOUNTER — Other Ambulatory Visit: Payer: Self-pay

## 2020-11-22 ENCOUNTER — Ambulatory Visit
Admission: RE | Admit: 2020-11-22 | Discharge: 2020-11-22 | Disposition: A | Discharge status: Home / Self Care | Payer: Medicare PPO | Source: Ambulatory Visit | Attending: Physician Assistant | Admitting: Physician Assistant

## 2020-11-22 DIAGNOSIS — Z1231 Encounter for screening mammogram for malignant neoplasm of breast: Secondary | ICD-10-CM

## 2020-11-23 ENCOUNTER — Other Ambulatory Visit: Payer: Self-pay | Admitting: Physician Assistant

## 2020-11-23 DIAGNOSIS — R928 Other abnormal and inconclusive findings on diagnostic imaging of breast: Secondary | ICD-10-CM

## 2020-12-15 ENCOUNTER — Ambulatory Visit
Admission: RE | Admit: 2020-12-15 | Discharge: 2020-12-15 | Disposition: A | Discharge status: Home / Self Care | Payer: Medicare PPO | Source: Ambulatory Visit | Attending: Physician Assistant | Admitting: Physician Assistant

## 2020-12-15 ENCOUNTER — Ambulatory Visit: Payer: Medicare PPO

## 2020-12-15 ENCOUNTER — Other Ambulatory Visit: Payer: Self-pay

## 2020-12-15 DIAGNOSIS — R928 Other abnormal and inconclusive findings on diagnostic imaging of breast: Secondary | ICD-10-CM

## 2021-01-08 ENCOUNTER — Other Ambulatory Visit: Payer: Self-pay

## 2021-01-08 ENCOUNTER — Encounter (HOSPITAL_BASED_OUTPATIENT_CLINIC_OR_DEPARTMENT_OTHER): Payer: Self-pay | Admitting: *Deleted

## 2021-01-08 ENCOUNTER — Emergency Department (HOSPITAL_BASED_OUTPATIENT_CLINIC_OR_DEPARTMENT_OTHER): Payer: Medicare PPO

## 2021-01-08 ENCOUNTER — Emergency Department (HOSPITAL_BASED_OUTPATIENT_CLINIC_OR_DEPARTMENT_OTHER)
Admission: EM | Admit: 2021-01-08 | Discharge: 2021-01-08 | Disposition: A | Discharge status: Home / Self Care | Payer: Medicare PPO | Attending: Emergency Medicine | Admitting: Emergency Medicine

## 2021-01-08 DIAGNOSIS — S8991XA Unspecified injury of right lower leg, initial encounter: Secondary | ICD-10-CM | POA: Diagnosis present

## 2021-01-08 DIAGNOSIS — Z87891 Personal history of nicotine dependence: Secondary | ICD-10-CM | POA: Insufficient documentation

## 2021-01-08 DIAGNOSIS — Z79899 Other long term (current) drug therapy: Secondary | ICD-10-CM | POA: Insufficient documentation

## 2021-01-08 DIAGNOSIS — N183 Chronic kidney disease, stage 3 unspecified: Secondary | ICD-10-CM | POA: Diagnosis not present

## 2021-01-08 DIAGNOSIS — I82811 Embolism and thrombosis of superficial veins of right lower extremities: Secondary | ICD-10-CM | POA: Insufficient documentation

## 2021-01-08 DIAGNOSIS — I129 Hypertensive chronic kidney disease with stage 1 through stage 4 chronic kidney disease, or unspecified chronic kidney disease: Secondary | ICD-10-CM | POA: Insufficient documentation

## 2021-01-08 DIAGNOSIS — E1142 Type 2 diabetes mellitus with diabetic polyneuropathy: Secondary | ICD-10-CM | POA: Diagnosis not present

## 2021-01-08 DIAGNOSIS — S8001XA Contusion of right knee, initial encounter: Secondary | ICD-10-CM | POA: Diagnosis not present

## 2021-01-08 DIAGNOSIS — W07XXXA Fall from chair, initial encounter: Secondary | ICD-10-CM | POA: Insufficient documentation

## 2021-01-08 DIAGNOSIS — Z8616 Personal history of COVID-19: Secondary | ICD-10-CM | POA: Insufficient documentation

## 2021-01-08 DIAGNOSIS — J45909 Unspecified asthma, uncomplicated: Secondary | ICD-10-CM | POA: Insufficient documentation

## 2021-01-08 DIAGNOSIS — Z7951 Long term (current) use of inhaled steroids: Secondary | ICD-10-CM | POA: Insufficient documentation

## 2021-01-08 DIAGNOSIS — Z7984 Long term (current) use of oral hypoglycemic drugs: Secondary | ICD-10-CM | POA: Insufficient documentation

## 2021-01-08 LAB — CBC WITH DIFFERENTIAL/PLATELET
Abs Immature Granulocytes: 0.02 10*3/uL (ref 0.00–0.07)
Basophils Absolute: 0 10*3/uL (ref 0.0–0.1)
Basophils Relative: 1 %
Eosinophils Absolute: 0.2 10*3/uL (ref 0.0–0.5)
Eosinophils Relative: 4 %
HCT: 26.7 % — ABNORMAL LOW (ref 36.0–46.0)
Hemoglobin: 9 g/dL — ABNORMAL LOW (ref 12.0–15.0)
Immature Granulocytes: 0 %
Lymphocytes Relative: 23 %
Lymphs Abs: 1.3 10*3/uL (ref 0.7–4.0)
MCH: 34 pg (ref 26.0–34.0)
MCHC: 33.7 g/dL (ref 30.0–36.0)
MCV: 100.8 fL — ABNORMAL HIGH (ref 80.0–100.0)
Monocytes Absolute: 0.5 10*3/uL (ref 0.1–1.0)
Monocytes Relative: 8 %
Neutro Abs: 3.6 10*3/uL (ref 1.7–7.7)
Neutrophils Relative %: 64 %
Platelets: 194 10*3/uL (ref 150–400)
RBC: 2.65 MIL/uL — ABNORMAL LOW (ref 3.87–5.11)
RDW: 12.8 % (ref 11.5–15.5)
WBC: 5.6 10*3/uL (ref 4.0–10.5)
nRBC: 0 % (ref 0.0–0.2)

## 2021-01-08 LAB — BASIC METABOLIC PANEL
Anion gap: 9 (ref 5–15)
BUN: 29 mg/dL — ABNORMAL HIGH (ref 8–23)
CO2: 30 mmol/L (ref 22–32)
Calcium: 9.1 mg/dL (ref 8.9–10.3)
Chloride: 101 mmol/L (ref 98–111)
Creatinine, Ser: 1.77 mg/dL — ABNORMAL HIGH (ref 0.44–1.00)
GFR, Estimated: 29 mL/min — ABNORMAL LOW (ref >60)
Glucose, Bld: 121 mg/dL — ABNORMAL HIGH (ref 70–99)
Potassium: 3.5 mmol/L (ref 3.5–5.1)
Sodium: 140 mmol/L (ref 135–145)

## 2021-01-08 NOTE — Discharge Instructions (Addendum)
You were seen in the emergency department from swelling of your lower leg in the setting of a recent fall.  You had an ultrasound that showed a superficial partial occlusion of your gastrocnemius vein.  There was no evidence of a DVT.  Recommend aspirin and warm compress.  Please contact your primary care doctor and get their recommendations whether you should follow-up with vascular surgery.

## 2021-01-08 NOTE — ED Notes (Signed)
Roomed in room 15.

## 2021-01-08 NOTE — ED Provider Notes (Signed)
[place] EMERGENCY DEPT Provider Note   CSN: [id number] Arrival date & time: 01/08/21  1435     History Chief Complaint  Patient presents with   Leg Pain    Teresa Daniels is a 80 y.o. female.  She is brought in by her daughter who is giving most of the history.  She has had right leg swelling for about a week.  She had injured an area behind her right knee when she fell off of a chair.  She had some bruising behind the knee.  That seems to have resolved but now she has swelling in her lower leg.  Associated with some tingling.  No chest pain or shortness of breath.  Concern for DVT.  No prior history of DVT she is on Plavix for prior stroke  The history is provided by the patient and a relative.  Leg Pain Location:  Leg Time since incident:  1 week Injury: yes   Mechanism of injury: fall   Fall:    Fall occurred:  From a stool Leg location:  R lower leg Pain details:    Quality:  Dull   Radiates to:  Does not radiate   Severity:  No pain   Onset quality:  Gradual   Duration:  1 week   Timing:  Constant   Progression:  Unchanged Chronicity:  New Relieved by:  Nothing Worsened by:  Activity Ineffective treatments:  Rest Associated symptoms: swelling and tingling   Associated symptoms: no fever and no neck pain       Past Medical History:  Diagnosis Date   Asthma    Diabetes mellitus without complication ([place])    Hypertension    Stroke [facility])     Patient Active Problem List   Diagnosis Date Noted   COVID-19 08/08/2019   Acute respiratory failure with hypoxemia ([place]) 08/08/2019   History of CVA (cerebrovascular accident) 08/08/2019   Anemia 08/08/2019   CKD (chronic kidney disease), stage III ([place]) 08/08/2019   Dehydration    Vitamin B12 deficiency 02/10/2019   Unsteady gait 02/10/2019   Ischemic stroke ([place]) 12/06/2017   Diabetic polyneuropathy associated with type 2 diabetes mellitus ([place]) 12/06/2017    Past Surgical History:  Procedure  Laterality Date   BREAST LUMPECTOMY Left    CESAREAN SECTION       OB History     Gravida  2   Para  2   Term      Preterm      AB      Living         SAB      IAB      Ectopic      Multiple      Live Births              History reviewed. No pertinent family history.  Social History   Tobacco Use   Smoking status: Former    Pack years: 0.00    Types: Cigarettes    Quit date: 1969    Years since quitting: 53.5   Smokeless tobacco: Never  Vaping Use   Vaping Use: Never used  Substance Use Topics   Alcohol use: Yes    Comment: occasionally   Drug use: Never    Home Medications Prior to Admission medications   Medication Sig Start Date End Date Taking? Authorizing Provider  acetaminophen (TYLENOL) 500 MG tablet Take 1,000 mg by mouth at bedtime.   Yes [provider]  albuterol (VENTOLIN  HFA) 108 (90 Base) MCG/ACT inhaler Inhale 2 puffs into the lungs every 6 (six) hours as needed for wheezing.   Yes [provider]  Ascorbic Acid (VITAMIN C PO) Take 500 mg by mouth daily.    Yes [provider]  atorvastatin (LIPITOR) 40 MG tablet Take 40 mg by mouth every evening.    Yes [provider]  cetirizine (ZYRTEC) 5 MG tablet Take 5 mg by mouth daily at 12 noon.   Yes [provider]  clopidogrel (PLAVIX) 75 MG tablet Take 75 mg by mouth daily.    Yes [provider]  ferrous sulfate 325 (65 FE) MG tablet Take 325 mg by mouth 2 (two) times daily with a meal.    Yes [provider]  gabapentin (NEURONTIN) 300 MG capsule Take 300 mg by mouth at bedtime.  11/26/17  Yes [provider]  glipiZIDE (GLUCOTROL XL) 2.5 MG 24 hr tablet Take by mouth. 09/28/20  Yes [provider]  hydrochlorothiazide (HYDRODIURIL) 25 MG tablet Take 25 mg by mouth daily.  11/26/17  Yes [provider]  losartan (COZAAR) 50 MG tablet Take 50 mg by mouth daily. One tablet daily   Yes [provider]  MAGNESIUM PO Take 250 mg by mouth daily.    Yes [provider]  montelukast (SINGULAIR) 10 MG tablet Take 10 mg by mouth daily at 12 noon.    Yes [provider]  Omega-3 1000 MG CAPS Take 1,000 mg by mouth daily.    Yes [provider]  pantoprazole (PROTONIX) 40 MG tablet Take 40 mg by mouth daily.    Yes [provider]  sertraline (ZOLOFT) 100 MG tablet Take 100 mg by mouth daily.  02/18/18  Yes [provider]  beclomethasone (QVAR) 40 MCG/ACT inhaler Inhale 1 puff into the lungs 2 (two) times daily.     [provider]  cholecalciferol (VITAMIN D) 1000 units tablet Take 1,000 Units by mouth daily.  Patient not taking: Reported on 10/01/2020    [provider]  colestipol (COLESTID) 1 g tablet Take by mouth. Patient not taking: No sig reported 04/27/20   [provider]  Cyanocobalamin ([employer] B12 QUICK DISSOLVE) 500 MCG LOZG Take 500 mcg by mouth daily.    [provider]  megestrol (MEGACE) 40 MG tablet Take by mouth. Patient not taking: No sig reported 06/04/20   [provider]  Melatonin 10 MG TABS Take 10 mg by mouth every evening. Patient not taking: No sig reported    [provider]  metFORMIN (GLUCOPHAGE) 1000 MG tablet Take 1,000 mg by mouth 2 (two) times daily with a meal. Breakfast and supper Patient not taking: No sig reported    [provider]  mirtazapine (REMERON) 15 MG tablet Take 7.5 mg by mouth every evening. Taken 7.'5mg'$  daily 04/25/18   [provider]  zinc sulfate 220 (50 Zn) MG capsule Take 1 capsule (220 mg total) by mouth daily. Patient not taking: No sig reported 08/11/19   Georgette Shell, MD    Allergies    Patient has no known allergies.  Review of Systems   Review of Systems  Constitutional:  Negative for fever.  Respiratory:  Negative for shortness of breath.   Cardiovascular:  Negative for chest pain.   Musculoskeletal:  Negative for neck pain.  Skin:  Negative for wound.  Neurological:  Negative for weakness.   Physical Exam Updated Vital Signs BP (!) 151/67  Pulse 63   Temp 98.5 F (36.9 C)   Resp 20   Ht '5\' 5"'$  (1.651 m)   Wt 70.8 kg   SpO2 96%   BMI 25.97 kg/m   Physical Exam Vitals and nursing note reviewed.  Constitutional:      General: She is not in acute distress.    Appearance: Normal appearance. She is well-developed.  HENT:     Head: Normocephalic and atraumatic.  Eyes:     Conjunctiva/sclera: Conjunctivae normal.  Cardiovascular:     Rate and Rhythm: Normal rate and regular rhythm.     Heart sounds: No murmur heard. Pulmonary:     Effort: Pulmonary effort is normal. No respiratory distress.     Breath sounds: Normal breath sounds.  Abdominal:     Palpations: Abdomen is soft.     Tenderness: There is no abdominal tenderness.  Musculoskeletal:        General: Swelling present. No tenderness.     Cervical back: Neck supple.     Right lower leg: Edema present.     Comments: She is some swelling of her right lower leg mostly at her ankle.  It slightly warmer than her left.  Distal pulses intact.  Motor intact.  Sensation diminished over swollen area  Skin:    General: Skin is warm and dry.  Neurological:     General: No focal deficit present.     Mental Status: She is alert.    ED Results / Procedures / Treatments   Labs (all labs ordered are listed, but only abnormal results are displayed) Labs Reviewed  BASIC METABOLIC PANEL - Abnormal; Notable for the following components:      Result Value   Glucose, Bld 121 (*)    BUN 29 (*)    Creatinine, Ser 1.77 (*)    GFR, Estimated 29 (*)    All other components within normal limits  CBC WITH DIFFERENTIAL/PLATELET - Abnormal; Notable for the following components:   RBC 2.65 (*)    Hemoglobin 9.0 (*)    HCT 26.7 (*)    MCV 100.8 (*)    All other components within normal limits     EKG None  Radiology US Venous Img Lower Right (DVT Study)  Result Date: 01/08/2021 CLINICAL DATA:  Right calf pain for 8 days after EXAM: Right LOWER EXTREMITY VENOUS DOPPLER ULTRASOUND TECHNIQUE: Gray-scale sonography with compression, as well as color and duplex ultrasound, were performed to evaluate the deep venous system(s) from the level of the common femoral vein through the popliteal and proximal calf veins. COMPARISON:  Radiograph 03/08/2018 FINDINGS: VENOUS Normal compressibility of the common femoral, superficial femoral, and popliteal veins. Visualized portions of profunda femoral vein and great saphenous vein unremarkable. Poor visualization of the posterior tibial and peroneal veins. Only partial visualization posterior tibials at the level of the ankle. No filling defects to suggest DVT on grayscale or color Doppler imaging. Doppler waveforms show normal direction of venous flow, normal respiratory plasticity and response to augmentation. Superficial gastrocnemius vein appears partially occluded and incompletely compressible (44/69). Limited views of the contralateral common femoral vein are unremarkable. OTHER Extensive soft tissue edematous changes and contusive features towards the level the calf. No large focal hematoma is seen. Limitations: none IMPRESSION: No visible femoropopliteal deep venous thrombosis is evident. Only partial visualization of the deep veins of calf. Partially occlusive, incompressible thrombus in a superficial right gastrocnemius vein. Edematous and contusive changes of the lower extremity. Electronically Signed   By:  Lovena Le M.D.   On: 01/08/2021 17:44    Procedures Procedures   Medications Ordered in ED Medications - No data to display  ED Course  I have reviewed the triage vital signs and the nursing notes.  Pertinent labs & imaging results that were available during my care of the patient were reviewed by me and considered in my medical decision  making (see chart for details).  Clinical Course as of 01/09/21 [id number]  Sat Jan 08, 2021  1900 Tried to consult vascular regarding whether they would recommend anticoagulation for this.  Unfortunately have not received a call back yet.  Patient and daughter would like to be discharged.  I am recommending that they do full dose aspirin and warm compress and follow-up with her primary care doctor.  We will give them vascular follow-up information. [MB]    Clinical Course User Index [MB] Hayden Rasmussen, MD   MDM Rules/Calculators/A&P                         This patient complains of right lower leg swelling and pain; this involves an extensive number of treatment Options and is a complaint that carries with it a high risk of complications and Morbidity. The differential includes DVT, peripheral edema, cellulitis  I ordered, reviewed and interpreted labs, which included CBC with normal white count, hemoglobin low stable from priors, chemistries with elevations BUN and creatinine stable from priors  I ordered imaging studies which included venous ultrasound and I independently    visualized and interpreted imaging which showed no acute DVT.  It does show edema and noncompressible partial superficial gastroc occlusion Additional history obtained from patient's daughter Previous records obtained and reviewed in epic, no recent admissions   After the interventions stated above, I reevaluated the patient and found patient to be otherwise well-appearing.  Hypertensive here but otherwise normal vitals.  Due to her chronic anemia and renal dysfunction do not feel being aggressive and putting her on DOAC is indicated at this time.  Recommended aspirin and warm compress and PCP follow-up.  She may need serial ultrasounds to make sure that this does not progress.  Return instructions discussed.   Final Clinical Impression(s) / ED Diagnoses Final diagnoses:  Venous embolism and thrombosis of superficial  vessels of right lower extremity    Rx / DC Orders ED Discharge Orders     None        Hayden Rasmussen, MD 01/09/21 [phone]

## 2021-01-08 NOTE — ED Triage Notes (Addendum)
Right leg pain a week ago.  Right leg is also swollen.  Pt also fell Friday, a week ago.

## 2021-01-25 ENCOUNTER — Ambulatory Visit (INDEPENDENT_AMBULATORY_CARE_PROVIDER_SITE_OTHER): Payer: Medicare PPO | Admitting: Vascular Surgery

## 2021-01-25 ENCOUNTER — Encounter: Payer: Self-pay | Admitting: Vascular Surgery

## 2021-01-25 ENCOUNTER — Other Ambulatory Visit: Payer: Self-pay

## 2021-01-25 VITALS — BP 135/78 | HR 79 | Temp 97.9°F | Resp 16 | Ht 65.0 in | Wt 167.0 lb

## 2021-01-25 DIAGNOSIS — I8001 Phlebitis and thrombophlebitis of superficial vessels of right lower extremity: Secondary | ICD-10-CM

## 2021-01-25 DIAGNOSIS — I872 Venous insufficiency (chronic) (peripheral): Secondary | ICD-10-CM

## 2021-01-25 NOTE — Progress Notes (Signed)
[facility] [place]  ASSESSMENT / PLAN: 80 y.o. female with superficial thrombophlebitis of the right calf gastrocnemius vein.  Counseled that this is a benign and self-limited condition.  She should treat this symptomatically using compression, elevation, NSAIDs, and occasional warm compresses.  She also has evidence of chronic venous insufficiency.  Compression and elevation will help this as well.  She can follow-up with me as needed.  CHIEF COMPLAINT: Referral from ER for thrombophlebitis  HISTORY OF PRESENT ILLNESS: Teresa Daniels is a 80 y.o. female presents to the clinic as a referral from the [facility] for evaluation of superficial thrombophlebitis.  The patient reports she has fallen beginning of the month.  She had pain in her right calf prompting her presentation to the [facility] ER.  A duplex ultrasound was performed and revealed a gastrocnemius superficial thrombophlebitis.  There was no evidence of deep venous thrombosis.  She was started on high intensity aspirin therapy in addition to her Plavix.  She was referred to my clinic for further evaluation.  Reports some persistent mild discomfort in the posterior calf.  She is ambulatory with a walker.  She still has occasional swelling right greater than left lower extremity  Past Medical History:  Diagnosis Date   Asthma    Diabetes mellitus without complication ([place])    Hypertension    Stroke [facility])     Past Surgical History:  Procedure Laterality Date   BREAST LUMPECTOMY Left    CESAREAN SECTION      History reviewed. No pertinent family history.  Social History   Socioeconomic History   Marital status: Widowed    Spouse name: Not on file   Number of children: 2   Years of education: 8   Highest education level: Not on file  Occupational History   Occupation: retired Web designer  Tobacco Use   Smoking status: Former    Types: Cigarettes    Quit date: 1969    Years since  quitting: 53.5   Smokeless tobacco: Never  Vaping Use   Vaping Use: Never used  Substance and Sexual Activity   Alcohol use: Yes    Comment: occasionally   Drug use: Never   Sexual activity: Not on file  Other Topics Concern   Not on file  Social History Narrative   Lives independent living..  Has 2 children.  Retired Web designer.  Education: college.       Right hand   Social Determinants of Health   Financial Resource Strain: Not on file  Food Insecurity: Not on file  Transportation Needs: Not on file  Physical Activity: Not on file  Stress: Not on file  Social Connections: Not on file  Intimate Partner Violence: Not on file    No Known Allergies  Current Outpatient Medications  Medication Sig Dispense Refill   acetaminophen (TYLENOL) 500 MG tablet Take 1,000 mg by mouth at bedtime.     albuterol (VENTOLIN HFA) 108 (90 Base) MCG/ACT inhaler Inhale 2 puffs into the lungs every 6 (six) hours as needed for wheezing.     Ascorbic Acid (VITAMIN C PO) Take 500 mg by mouth daily.      atorvastatin (LIPITOR) 40 MG tablet Take 40 mg by mouth every evening.      beclomethasone (QVAR) 40 MCG/ACT inhaler Inhale 1 puff into the lungs 2 (two) times daily.      cetirizine (ZYRTEC) 5 MG tablet Take 5 mg by mouth daily at 12 noon.  clopidogrel (PLAVIX) 75 MG tablet Take 75 mg by mouth daily.      Cyanocobalamin (CVS B12 QUICK DISSOLVE) 500 MCG LOZG Take 500 mcg by mouth daily.     ferrous sulfate 325 (65 FE) MG tablet Take 325 mg by mouth 2 (two) times daily with a meal.      gabapentin (NEURONTIN) 300 MG capsule Take 300 mg by mouth at bedtime.      glipiZIDE (GLUCOTROL XL) 2.5 MG 24 hr tablet Take by mouth.     hydrochlorothiazide (HYDRODIURIL) 25 MG tablet Take 25 mg by mouth daily.      losartan (COZAAR) 50 MG tablet Take 50 mg by mouth daily. One tablet daily     MAGNESIUM PO Take 250 mg by mouth daily.      mirtazapine (REMERON) 15 MG tablet Take 7.5 mg by mouth  every evening. Taken 7.'5mg'$  daily     montelukast (SINGULAIR) 10 MG tablet Take 10 mg by mouth daily at 12 noon.      Omega-3 1000 MG CAPS Take 1,000 mg by mouth daily.      pantoprazole (PROTONIX) 40 MG tablet Take 40 mg by mouth daily.      sertraline (ZOLOFT) 100 MG tablet Take 100 mg by mouth daily.      cholecalciferol (VITAMIN D) 1000 units tablet Take 1,000 Units by mouth daily.  (Patient not taking: No sig reported)     colestipol (COLESTID) 1 g tablet Take by mouth. (Patient not taking: No sig reported)     megestrol (MEGACE) 40 MG tablet Take by mouth. (Patient not taking: No sig reported)     Melatonin 10 MG TABS Take 10 mg by mouth every evening. (Patient not taking: No sig reported)     metFORMIN (GLUCOPHAGE) 1000 MG tablet Take 1,000 mg by mouth 2 (two) times daily with a meal. Breakfast and supper (Patient not taking: No sig reported)     zinc sulfate 220 (50 Zn) MG capsule Take 1 capsule (220 mg total) by mouth daily. (Patient not taking: No sig reported)     No current facility-administered medications for this visit.    REVIEW OF SYSTEMS:  '[X]'$  denotes positive finding, '[ ]'$  denotes negative finding Cardiac  Comments:  Chest pain or chest pressure:    Shortness of breath upon exertion:    Short of breath when lying flat:    Irregular heart rhythm:        Vascular    Pain in calf, thigh, or hip brought on by ambulation:    Pain in feet at night that wakes you up from your sleep:     Blood clot in your veins:    Leg swelling:         Pulmonary    Oxygen at home:    Productive cough:     Wheezing:         Neurologic    Sudden weakness in arms or legs:     Sudden numbness in arms or legs:     Sudden onset of difficulty speaking or slurred speech:    Temporary loss of vision in one eye:     Problems with dizziness:         Gastrointestinal    Blood in stool:     Vomited blood:         Genitourinary    Burning when urinating:     Blood in urine:         Psychiatric  Major depression:         Hematologic    Bleeding problems:    Problems with blood clotting too easily:        Skin    Rashes or ulcers:        Constitutional    Fever or chills:      PHYSICAL EXAM Vitals:   01/25/21 1410  BP: 135/78  Pulse: 79  Resp: 16  Temp: 97.9 F (36.6 C)  TempSrc: Temporal  SpO2: 95%  Weight: 167 lb (75.8 kg)  Height: '5\' 5"'$  (1.651 m)    Constitutional: Elderly, but well appearing. no distress. Appears well nourished.  Neurologic: CN intact. no focal findings. no sensory loss. Psychiatric:  Mood and affect symmetric and appropriate. Eyes:  No icterus. No conjunctival pallor. Ears, nose, throat:  mucous membranes moist. Midline trachea.  Cardiac: regular rate and rhythm.  Respiratory:  unlabored. Abdominal:  soft, non-tender, non-distended.  Peripheral vascular: 2+ DP pulses. No palpable cord. Extremity: 2+ pitting edema to the knees bilaterally. no cyanosis. no pallor.  Skin: no gangrene. no ulceration.  Lymphatic: no Stemmer's sign. no palpable lymphadenopathy.  PERTINENT LABORATORY AND RADIOLOGIC DATA  Most recent CBC CBC Latest Ref Rng & Units 01/08/2021 06/25/2020 06/07/2020  WBC 4.0 - 10.5 K/uL 5.6 7.0 5.9  Hemoglobin 12.0 - 15.0 g/dL 9.0(L) 7.7(L) 9.0(L)  Hematocrit 36.0 - 46.0 % 26.7(L) 24.3(L) 26.4(L)  Platelets 150 - 400 K/uL 194 272 237     Most recent CMP CMP Latest Ref Rng & Units 01/08/2021 06/25/2020 06/07/2020  Glucose 70 - 99 mg/dL 121(H) 160(H) 158(H)  BUN 8 - 23 mg/dL 29(H) 20 37(H)  Creatinine 0.44 - 1.00 mg/dL 1.77(H) 1.75(H) 2.01(H)  Sodium 135 - 145 mmol/L 140 140 135  Potassium 3.5 - 5.1 mmol/L 3.5 3.5 3.1(L)  Chloride 98 - 111 mmol/L 101 103 95(L)  CO2 22 - 32 mmol/L '30 26 26  '$ Calcium 8.9 - 10.3 mg/dL 9.1 9.0 9.3  Total Protein 6.5 - 8.1 g/dL - - 6.8  Total Bilirubin 0.3 - 1.2 mg/dL - - 0.8  Alkaline Phos 38 - 126 U/L - - 92  AST 15 - 41 U/L - - 19  ALT 0 - 44 U/L - - 10    Renal  function Estimated Creatinine Clearance: 26.2 mL/min (A) (by C-G formula based on SCr of 1.77 mg/dL (H)).  Hgb A1c MFr Bld (%)  Date Value  08/07/2019 6.3 (H)   CLINICAL DATA:  Right calf pain for 8 days after   EXAM: Right LOWER EXTREMITY VENOUS DOPPLER ULTRASOUND   TECHNIQUE: Gray-scale sonography with compression, as well as color and duplex ultrasound, were performed to evaluate the deep venous system(s) from the level of the common femoral vein through the popliteal and proximal calf veins.   COMPARISON:  Radiograph 03/08/2018   FINDINGS: VENOUS   Normal compressibility of the common femoral, superficial femoral, and popliteal veins. Visualized portions of profunda femoral vein and great saphenous vein unremarkable. Poor visualization of the posterior tibial and peroneal veins. Only partial visualization posterior tibials at the level of the ankle. No filling defects to suggest DVT on grayscale or color Doppler imaging. Doppler waveforms show normal direction of venous flow, normal respiratory plasticity and response to augmentation. Superficial gastrocnemius vein appears partially occluded and incompletely compressible (44/69).   Limited views of the contralateral common femoral vein are unremarkable.   OTHER   Extensive soft tissue edematous changes and contusive features towards the level the calf.  No large focal hematoma is seen.   Limitations: none   IMPRESSION: No visible femoropopliteal deep venous thrombosis is evident. Only partial visualization of the deep veins of calf.   Partially occlusive, incompressible thrombus in a superficial right gastrocnemius vein.   Edematous and contusive changes of the lower extremity.     Electronically Signed   By: Lovena Le M.D.   On: 01/08/2021 17:44   Yevonne Aline. Stanford Breed, MD [facility] Phone Number: [phone] 01/25/2021 4:27 PM

## 2021-02-04 ENCOUNTER — Other Ambulatory Visit: Payer: Self-pay

## 2021-02-04 ENCOUNTER — Emergency Department (HOSPITAL_BASED_OUTPATIENT_CLINIC_OR_DEPARTMENT_OTHER)
Admission: EM | Admit: 2021-02-04 | Discharge: 2021-02-04 | Disposition: A | Discharge status: Home / Self Care | Payer: Medicare PPO | Source: Home / Self Care | Attending: Emergency Medicine | Admitting: Emergency Medicine

## 2021-02-04 ENCOUNTER — Encounter (HOSPITAL_BASED_OUTPATIENT_CLINIC_OR_DEPARTMENT_OTHER): Payer: Self-pay | Admitting: Emergency Medicine

## 2021-02-04 ENCOUNTER — Emergency Department (HOSPITAL_BASED_OUTPATIENT_CLINIC_OR_DEPARTMENT_OTHER): Payer: Medicare PPO

## 2021-02-04 DIAGNOSIS — N183 Chronic kidney disease, stage 3 unspecified: Secondary | ICD-10-CM | POA: Insufficient documentation

## 2021-02-04 DIAGNOSIS — W228XXA Striking against or struck by other objects, initial encounter: Secondary | ICD-10-CM | POA: Insufficient documentation

## 2021-02-04 DIAGNOSIS — I129 Hypertensive chronic kidney disease with stage 1 through stage 4 chronic kidney disease, or unspecified chronic kidney disease: Secondary | ICD-10-CM | POA: Insufficient documentation

## 2021-02-04 DIAGNOSIS — S0101XA Laceration without foreign body of scalp, initial encounter: Secondary | ICD-10-CM | POA: Insufficient documentation

## 2021-02-04 DIAGNOSIS — Z79899 Other long term (current) drug therapy: Secondary | ICD-10-CM | POA: Insufficient documentation

## 2021-02-04 DIAGNOSIS — J159 Unspecified bacterial pneumonia: Secondary | ICD-10-CM | POA: Diagnosis not present

## 2021-02-04 DIAGNOSIS — Z8616 Personal history of COVID-19: Secondary | ICD-10-CM | POA: Insufficient documentation

## 2021-02-04 DIAGNOSIS — J189 Pneumonia, unspecified organism: Secondary | ICD-10-CM | POA: Diagnosis not present

## 2021-02-04 DIAGNOSIS — E1142 Type 2 diabetes mellitus with diabetic polyneuropathy: Secondary | ICD-10-CM | POA: Insufficient documentation

## 2021-02-04 DIAGNOSIS — J45909 Unspecified asthma, uncomplicated: Secondary | ICD-10-CM | POA: Insufficient documentation

## 2021-02-04 DIAGNOSIS — Z87891 Personal history of nicotine dependence: Secondary | ICD-10-CM | POA: Insufficient documentation

## 2021-02-04 DIAGNOSIS — Z7984 Long term (current) use of oral hypoglycemic drugs: Secondary | ICD-10-CM | POA: Insufficient documentation

## 2021-02-04 DIAGNOSIS — E1122 Type 2 diabetes mellitus with diabetic chronic kidney disease: Secondary | ICD-10-CM | POA: Insufficient documentation

## 2021-02-04 DIAGNOSIS — Z7901 Long term (current) use of anticoagulants: Secondary | ICD-10-CM | POA: Insufficient documentation

## 2021-02-04 NOTE — ED Provider Notes (Signed)
[facility] EMERGENCY DEPT Provider Note   CSN: [id number] Arrival date & time: 02/04/21  1013     History Chief Complaint  Patient presents with   Fall    Teresa Daniels is a 80 y.o. female.  Teresa Daniels was having difficulty sleeping.  She got up and started to work on a jigsaw puzzle.  She slid out of her chair after falling asleep, and she struck her head on the floor.  She currently states that she feels completely fine.  She denies any symptoms.  She does take Plavix for history of CVA.  The history is provided by the patient.  Fall This is a new problem. The current episode started 3 to 5 hours ago. The problem occurs constantly. The problem has been resolved. Pertinent negatives include no chest pain, no abdominal pain, no headaches and no shortness of breath. Nothing aggravates the symptoms. Nothing relieves the symptoms. She has tried nothing for the symptoms. The treatment provided no relief.      Past Medical History:  Diagnosis Date   Asthma    Diabetes mellitus without complication ([place])    Hypertension    Stroke Teresa Daniels)     Patient Active Problem List   Diagnosis Date Noted   COVID-19 08/08/2019   Acute respiratory failure with hypoxemia ([place]) 08/08/2019   History of CVA (cerebrovascular accident) 08/08/2019   Anemia 08/08/2019   CKD (chronic kidney disease), stage III ([place]) 08/08/2019   Dehydration    Vitamin B12 deficiency 02/10/2019   Unsteady gait 02/10/2019   Ischemic stroke (Teresa Daniels) 12/06/2017   Diabetic polyneuropathy associated with type 2 diabetes mellitus ([place]) 12/06/2017    Past Surgical History:  Procedure Laterality Date   BREAST LUMPECTOMY Left    CESAREAN SECTION       OB History     Gravida  2   Para  2   Term      Preterm      AB      Living         SAB      IAB      Ectopic      Multiple      Live Births              No family history on file.  Social History   Tobacco Use   Smoking  status: Former    Types: Cigarettes    Quit date: 1969    Years since quitting: 53.6   Smokeless tobacco: Never  Vaping Use   Vaping Use: Never used  Substance Use Topics   Alcohol use: Yes    Comment: occasionally   Drug use: Never    Home Medications Prior to Admission medications   Medication Sig Start Date End Date Taking? Authorizing Provider  acetaminophen (TYLENOL) 500 MG tablet Take 1,000 mg by mouth at bedtime.   Yes Provider, Historical, Daniels  albuterol (VENTOLIN HFA) 108 (90 Base) MCG/ACT inhaler Inhale 2 puffs into the lungs every 6 (six) hours as needed for wheezing.   Yes Provider, Historical, Daniels  Ascorbic Acid (VITAMIN C PO) Take 500 mg by mouth daily.    Yes Provider, Historical, Daniels  atorvastatin (LIPITOR) 40 MG tablet Take 40 mg by mouth every evening.    Yes Provider, Historical, Daniels  clopidogrel (PLAVIX) 75 MG tablet Take 75 mg by mouth daily.    Yes Provider, Historical, Daniels  Cyanocobalamin ([employer] B12 QUICK DISSOLVE) 500 MCG LOZG Take 500 mcg by mouth daily.  Yes Provider, Historical, Daniels  ferrous sulfate 325 (65 FE) MG tablet Take 325 mg by mouth 2 (two) times daily with a meal.    Yes Provider, Historical, Daniels  gabapentin (NEURONTIN) 300 MG capsule Take 300 mg by mouth at bedtime.  11/26/17  Yes Provider, Historical, Daniels  hydrochlorothiazide (HYDRODIURIL) 25 MG tablet Take 25 mg by mouth daily.  11/26/17  Yes Provider, Historical, Daniels  losartan (COZAAR) 50 MG tablet Take 50 mg by mouth daily. One tablet daily   Yes Provider, Historical, Daniels  MAGNESIUM PO Take 250 mg by mouth daily.    Yes Provider, Historical, Daniels  mirtazapine (REMERON) 15 MG tablet Take 7.5 mg by mouth every evening. Taken 7.'5mg'$  daily 04/25/18  Yes Provider, Historical, Daniels  montelukast (SINGULAIR) 10 MG tablet Take 10 mg by mouth daily at 12 noon.    Yes Provider, Historical, Daniels  sertraline (ZOLOFT) 100 MG tablet Take 100 mg by mouth daily.  02/18/18  Yes Provider, Historical, Daniels  beclomethasone (QVAR) 40  MCG/ACT inhaler Inhale 1 puff into the lungs 2 (two) times daily.     Provider, Historical, Daniels  cetirizine (ZYRTEC) 5 MG tablet Take 5 mg by mouth daily at 12 noon.    Provider, Historical, Daniels  cholecalciferol (VITAMIN D) 1000 units tablet Take 1,000 Units by mouth daily.  Patient not taking: No sig reported    Provider, Historical, Daniels  colestipol (COLESTID) 1 g tablet Take by mouth. Patient not taking: No sig reported 04/27/20   Provider, Historical, Daniels  glipiZIDE (GLUCOTROL XL) 2.5 MG 24 hr tablet Take by mouth. 09/28/20   Provider, Historical, Daniels  megestrol (MEGACE) 40 MG tablet Take by mouth. Patient not taking: No sig reported 06/04/20   Provider, Historical, Daniels  Melatonin 10 MG TABS Take 10 mg by mouth every evening. Patient not taking: No sig reported    Provider, Historical, Daniels  metFORMIN (GLUCOPHAGE) 1000 MG tablet Take 1,000 mg by mouth 2 (two) times daily with a meal. Breakfast and supper Patient not taking: No sig reported    Provider, Historical, Daniels  Omega-3 1000 MG CAPS Take 1,000 mg by mouth daily.     Provider, Historical, Daniels  pantoprazole (PROTONIX) 40 MG tablet Take 40 mg by mouth daily.     Provider, Historical, Daniels  zinc sulfate 220 (50 Zn) MG capsule Take 1 capsule (220 mg total) by mouth daily. Patient not taking: No sig reported 08/11/19   Teresa Daniels    Allergies    Patient has no known allergies.  Review of Systems   Review of Systems  Constitutional:  Negative for chills and fever.  HENT:  Negative for ear pain and sore throat.   Eyes:  Negative for pain and visual disturbance.  Respiratory:  Negative for cough and shortness of breath.   Cardiovascular:  Negative for chest pain and palpitations.  Gastrointestinal:  Negative for abdominal pain and vomiting.  Genitourinary:  Negative for dysuria and hematuria.  Musculoskeletal:  Negative for arthralgias and back pain.  Skin:  Positive for wound. Negative for color change and rash.  Neurological:   Negative for seizures, syncope and headaches.  All other systems reviewed and are negative.  Physical Exam Updated Vital Signs BP (!) 160/73 (BP Location: Right Arm)   Pulse 76   Temp 98.6 F (37 C) (Oral)   Resp 16   Ht '5\' 5"'$  (1.651 m)   Wt 72.6 kg   SpO2 98%   BMI 26.63 kg/m  Physical Exam Vitals and nursing note reviewed.  HENT:     Head: Normocephalic.     Comments: Small, 2 cm laceration at the right parietal scalp.    Nose: Nose normal.     Mouth/Throat:     Mouth: Mucous membranes are moist.     Comments: No dental trauma Eyes:     General: No scleral icterus.    Extraocular Movements: Extraocular movements intact.     Conjunctiva/sclera: Conjunctivae normal.     Pupils: Pupils are equal, round, and reactive to light.  Pulmonary:     Effort: Pulmonary effort is normal. No respiratory distress.  Musculoskeletal:     Cervical back: Normal range of motion.     Comments: Spine is normal to inspection without midline tenderness  Skin:    General: Skin is warm and dry.  Neurological:     General: No focal deficit present.     Mental Status: She is alert and oriented to person, place, and time.     Cranial Nerves: No cranial nerve deficit.     Sensory: No sensory deficit.     Motor: No weakness.     Coordination: Coordination normal.  Psychiatric:        Mood and Affect: Mood normal.    ED Results / Procedures / Treatments   Labs (all labs ordered are listed, but only abnormal results are displayed) Labs Reviewed - No data to display  EKG None  Radiology CT Head Wo Contrast  Result Date: 02/04/2021 CLINICAL DATA:  80 year old female status post fall striking head this morning. EXAM: CT HEAD WITHOUT CONTRAST TECHNIQUE: Contiguous axial images were obtained from the base of the skull through the vertex without intravenous contrast. COMPARISON:  Brain MRI 06/25/2020.  Head CT 06/07/2020. FINDINGS: Brain: Stable non contrast CT appearance of the brain. No  midline shift, ventriculomegaly, mass effect, evidence of mass lesion, intracranial hemorrhage or evidence of cortically based acute infarction. Chronic lacunar infarct left corona radiata. Patchy additional white matter hypodensity and basal ganglia heterogeneity. Vascular: Calcified atherosclerosis at the skull base. No suspicious intracranial vascular hyperdensity. Skull: Stable and intact. Sinuses/Orbits: Visualized paranasal sinuses and mastoids are stable and well aerated. Other: Mild posterior right vertex scalp hematoma or contusion on series 3, image 63. Underlying calvarium intact. No other orbit or scalp soft tissue injury. IMPRESSION: 1. Mild posterior right vertex scalp hematoma or contusion without underlying skull fracture. 2. 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 Electronically Signed   By: Genevie Ann M.D.   On: 02/04/2021 11:15    Procedures .Marland KitchenLaceration Repair  Date/Time: 02/04/2021 11:51 AM Performed by: Arnaldo Natal, Daniels Authorized by: Arnaldo Natal, Daniels   Consent:    Consent obtained:  Verbal   Consent given by:  Patient   Risks, benefits, and alternatives were discussed: yes     Risks discussed:  Infection, need for additional repair, nerve damage, pain, poor cosmetic result, poor wound healing, retained foreign body and vascular damage   Alternatives discussed:  No treatment, delayed treatment, observation and referral Universal protocol:    Procedure explained and questions answered to patient or proxy's satisfaction: yes     Immediately prior to procedure, a time out was called: yes     Patient identity confirmed:  Verbally with patient Anesthesia:    Anesthesia method:  None Laceration details:    Location:  Scalp   Scalp location:  R parietal   Length (cm):  2  Depth (mm):  2 Pre-procedure details:    Preparation:  Patient was prepped and draped in usual sterile fashion Exploration:    Limited defect created (wound extended): no      Hemostasis achieved with:  Direct pressure   Wound exploration: wound explored through full range of motion and entire depth of wound visualized     Wound extent: no foreign bodies/material noted, no underlying fracture noted and no vascular damage noted     Contaminated: no   Treatment:    Area cleansed with:  Saline   Amount of cleaning:  Extensive   Irrigation solution:  Sterile saline   Irrigation volume:  10 cc   Irrigation method:  Syringe   Visualized foreign bodies/material removed: no     Debridement:  None   Undermining:  None Skin repair:    Repair method:  Staples   Number of staples:  2 Approximation:    Approximation:  Close Repair type:    Repair type:  Simple Post-procedure details:    Dressing:  Open (no dressing)   Procedure completion:  Tolerated well, no immediate complications   Medications Ordered in ED Medications - No data to display  ED Course  I have reviewed the triage vital signs and the nursing notes.  Pertinent labs & imaging results that were available during my care of the patient were reviewed by me and considered in my medical decision making (see chart for details).    MDM Rules/Calculators/A&P                           Edra Armbrust presented with a laceration after striking her head on the floor.  CT scan was negative for intracranial bleeding or other trauma.  Wound was repaired.  She denies other trauma.  No suggestion of underlying reason for her fall.  Return precautions were given. Final Clinical Impression(s) / ED Diagnoses Final diagnoses:  Laceration of scalp, initial encounter  Long term (current) use of anticoagulants    Rx / DC Orders ED Discharge Orders     None        Arnaldo Natal, Daniels 02/04/21 1153

## 2021-02-04 NOTE — ED Notes (Signed)
Patient transported to CT 

## 2021-02-04 NOTE — ED Triage Notes (Signed)
Pl asleep at [facility] 0500 this morning while working on a jigsaw puzzle. Pt  slid out of her chair and hit her head on an unknown object.   Pt is on Plavix  Pt denis pain  Pt denis LOC. Pt denis N/V.

## 2021-02-06 ENCOUNTER — Other Ambulatory Visit: Payer: Self-pay

## 2021-02-06 ENCOUNTER — Emergency Department (HOSPITAL_COMMUNITY): Payer: Medicare PPO

## 2021-02-06 ENCOUNTER — Encounter (HOSPITAL_COMMUNITY): Payer: Self-pay

## 2021-02-06 ENCOUNTER — Inpatient Hospital Stay (HOSPITAL_COMMUNITY)
Admission: EM | Admit: 2021-02-06 | Discharge: 2021-02-16 | DRG: 982 | Disposition: A | Discharge status: Skilled Nursing Facility | Payer: Medicare PPO | Attending: Internal Medicine | Admitting: Internal Medicine

## 2021-02-06 DIAGNOSIS — Z8616 Personal history of COVID-19: Secondary | ICD-10-CM | POA: Diagnosis not present

## 2021-02-06 DIAGNOSIS — E538 Deficiency of other specified B group vitamins: Secondary | ICD-10-CM | POA: Diagnosis present

## 2021-02-06 DIAGNOSIS — D62 Acute posthemorrhagic anemia: Secondary | ICD-10-CM | POA: Diagnosis not present

## 2021-02-06 DIAGNOSIS — N184 Chronic kidney disease, stage 4 (severe): Secondary | ICD-10-CM | POA: Diagnosis present

## 2021-02-06 DIAGNOSIS — J45909 Unspecified asthma, uncomplicated: Secondary | ICD-10-CM | POA: Diagnosis present

## 2021-02-06 DIAGNOSIS — S82851A Displaced trimalleolar fracture of right lower leg, initial encounter for closed fracture: Secondary | ICD-10-CM | POA: Diagnosis present

## 2021-02-06 DIAGNOSIS — R531 Weakness: Secondary | ICD-10-CM

## 2021-02-06 DIAGNOSIS — E1142 Type 2 diabetes mellitus with diabetic polyneuropathy: Secondary | ICD-10-CM | POA: Diagnosis present

## 2021-02-06 DIAGNOSIS — Z7902 Long term (current) use of antithrombotics/antiplatelets: Secondary | ICD-10-CM

## 2021-02-06 DIAGNOSIS — I69391 Dysphagia following cerebral infarction: Secondary | ICD-10-CM

## 2021-02-06 DIAGNOSIS — Z87891 Personal history of nicotine dependence: Secondary | ICD-10-CM

## 2021-02-06 DIAGNOSIS — T148XXA Other injury of unspecified body region, initial encounter: Secondary | ICD-10-CM

## 2021-02-06 DIAGNOSIS — X501XXA Overexertion from prolonged static or awkward postures, initial encounter: Secondary | ICD-10-CM

## 2021-02-06 DIAGNOSIS — D649 Anemia, unspecified: Secondary | ICD-10-CM | POA: Diagnosis not present

## 2021-02-06 DIAGNOSIS — Z20822 Contact with and (suspected) exposure to covid-19: Secondary | ICD-10-CM | POA: Diagnosis present

## 2021-02-06 DIAGNOSIS — R296 Repeated falls: Secondary | ICD-10-CM | POA: Diagnosis present

## 2021-02-06 DIAGNOSIS — I129 Hypertensive chronic kidney disease with stage 1 through stage 4 chronic kidney disease, or unspecified chronic kidney disease: Secondary | ICD-10-CM | POA: Diagnosis present

## 2021-02-06 DIAGNOSIS — E1122 Type 2 diabetes mellitus with diabetic chronic kidney disease: Secondary | ICD-10-CM | POA: Diagnosis present

## 2021-02-06 DIAGNOSIS — Z79899 Other long term (current) drug therapy: Secondary | ICD-10-CM | POA: Diagnosis not present

## 2021-02-06 DIAGNOSIS — Z8673 Personal history of transient ischemic attack (TIA), and cerebral infarction without residual deficits: Secondary | ICD-10-CM | POA: Diagnosis not present

## 2021-02-06 DIAGNOSIS — J159 Unspecified bacterial pneumonia: Principal | ICD-10-CM | POA: Diagnosis present

## 2021-02-06 DIAGNOSIS — Z7984 Long term (current) use of oral hypoglycemic drugs: Secondary | ICD-10-CM | POA: Diagnosis not present

## 2021-02-06 DIAGNOSIS — E876 Hypokalemia: Secondary | ICD-10-CM | POA: Diagnosis not present

## 2021-02-06 DIAGNOSIS — S82891A Other fracture of right lower leg, initial encounter for closed fracture: Secondary | ICD-10-CM

## 2021-02-06 DIAGNOSIS — J189 Pneumonia, unspecified organism: Secondary | ICD-10-CM

## 2021-02-06 DIAGNOSIS — Z419 Encounter for procedure for purposes other than remedying health state, unspecified: Secondary | ICD-10-CM

## 2021-02-06 LAB — COMPREHENSIVE METABOLIC PANEL
ALT: 15 U/L (ref 0–44)
AST: 16 U/L (ref 15–41)
Albumin: 3.6 g/dL (ref 3.5–5.0)
Alkaline Phosphatase: 92 U/L (ref 38–126)
Anion gap: 8 (ref 5–15)
BUN: 30 mg/dL — ABNORMAL HIGH (ref 8–23)
CO2: 30 mmol/L (ref 22–32)
Calcium: 9 mg/dL (ref 8.9–10.3)
Chloride: 102 mmol/L (ref 98–111)
Creatinine, Ser: 2.13 mg/dL — ABNORMAL HIGH (ref 0.44–1.00)
GFR, Estimated: 23 mL/min — ABNORMAL LOW (ref >60)
Glucose, Bld: 139 mg/dL — ABNORMAL HIGH (ref 70–99)
Potassium: 3.4 mmol/L — ABNORMAL LOW (ref 3.5–5.1)
Sodium: 140 mmol/L (ref 135–145)
Total Bilirubin: 0.8 mg/dL (ref 0.3–1.2)
Total Protein: 6.1 g/dL — ABNORMAL LOW (ref 6.5–8.1)

## 2021-02-06 LAB — CBC WITH DIFFERENTIAL/PLATELET
Abs Immature Granulocytes: 0.01 10*3/uL (ref 0.00–0.07)
Basophils Absolute: 0 10*3/uL (ref 0.0–0.1)
Basophils Relative: 1 %
Eosinophils Absolute: 0 10*3/uL (ref 0.0–0.5)
Eosinophils Relative: 1 %
HCT: 28.7 % — ABNORMAL LOW (ref 36.0–46.0)
Hemoglobin: 9.4 g/dL — ABNORMAL LOW (ref 12.0–15.0)
Immature Granulocytes: 0 %
Lymphocytes Relative: 16 %
Lymphs Abs: 0.8 10*3/uL (ref 0.7–4.0)
MCH: 33.9 pg (ref 26.0–34.0)
MCHC: 32.8 g/dL (ref 30.0–36.0)
MCV: 103.6 fL — ABNORMAL HIGH (ref 80.0–100.0)
Monocytes Absolute: 0.4 10*3/uL (ref 0.1–1.0)
Monocytes Relative: 8 %
Neutro Abs: 3.7 10*3/uL (ref 1.7–7.7)
Neutrophils Relative %: 74 %
Platelets: 180 10*3/uL (ref 150–400)
RBC: 2.77 MIL/uL — ABNORMAL LOW (ref 3.87–5.11)
RDW: 12.6 % (ref 11.5–15.5)
WBC: 4.9 10*3/uL (ref 4.0–10.5)
nRBC: 0 % (ref 0.0–0.2)

## 2021-02-06 MED ORDER — FENTANYL CITRATE (PF) 100 MCG/2ML IJ SOLN
50.0000 ug | Freq: Once | INTRAMUSCULAR | Status: AC
  Administered 2021-02-06: 50 ug via INTRAVENOUS
  Filled 2021-02-06: qty 2

## 2021-02-06 MED ORDER — LIDOCAINE-EPINEPHRINE (PF) 2 %-1:200000 IJ SOLN: 10.0000 mL | Freq: Once | INTRAMUSCULAR | Status: DC

## 2021-02-06 MED ORDER — SODIUM CHLORIDE 0.9 % IV SOLN
1.0000 g | INTRAVENOUS | Status: DC
  Administered 2021-02-06 – 2021-02-15 (×10): 1 g via INTRAVENOUS
  Filled 2021-02-06 (×10): qty 10

## 2021-02-06 MED ORDER — SODIUM CHLORIDE 0.9 % IV SOLN
500.0000 mg | INTRAVENOUS | Status: DC
  Administered 2021-02-06: 500 mg via INTRAVENOUS
  Filled 2021-02-06 (×2): qty 500

## 2021-02-06 MED ORDER — OXYCODONE HCL 5 MG PO TABS: 2.5000 mg | ORAL_TABLET | Freq: Once | ORAL | Status: DC

## 2021-02-06 MED ORDER — LIDOCAINE HCL (PF) 1 % IJ SOLN
INTRAMUSCULAR | Status: AC
  Administered 2021-02-06: 30 mL
  Filled 2021-02-06: qty 30

## 2021-02-06 MED ORDER — ACETAMINOPHEN 500 MG PO TABS
1000.0000 mg | ORAL_TABLET | Freq: Once | ORAL | Status: AC
  Administered 2021-02-06: 1000 mg via ORAL
  Filled 2021-02-06: qty 2

## 2021-02-06 NOTE — ED Notes (Signed)
Pt transported to radiology at this time °

## 2021-02-06 NOTE — ED Notes (Signed)
Provider at bedside at this time

## 2021-02-06 NOTE — ED Notes (Signed)
Provider at bedside

## 2021-02-06 NOTE — ED Notes (Signed)
Ortho at bedside.

## 2021-02-06 NOTE — ED Triage Notes (Signed)
Brought by [facility] due to a fall. Recent fall and seen at another facility for same. Received covid booster yesterday. Today she has had 2 falls one trying to get out of bed and one while trying to stand. Denies hitting head but doesn't remember the fall. Is on plavix. C/o pain to rt ankle

## 2021-02-06 NOTE — ED Provider Notes (Signed)
[facility] EMERGENCY DEPARTMENT Provider Note   CSN: [id number] Arrival date & time: 02/06/21  1911     History Chief Complaint  Patient presents with   Fall   Ankle Pain    Teresa Daniels is a 80 y.o. female.  80 yo F with a chief complaints of fall.  Patient states that she was walking and felt like she twisted her ankle and then fell down.  She denies any pain to the chest or the abdomen or pelvis.  Feels like the pain radiates from the ankle up to the knee.  She denies any left leg pain.  Denies any upper extremity pain.  Denies neck pain.  She is a little bit of pain from her head where she has had a laceration that was repaired about 48 hours ago.  The history is provided by the patient.  Fall This is a new problem. The current episode started 2 days ago. The problem occurs constantly. The problem has not changed since onset.Associated symptoms include headaches. Pertinent negatives include no chest pain and no shortness of breath. Nothing aggravates the symptoms. Nothing relieves the symptoms. She has tried nothing for the symptoms. The treatment provided no relief.  Ankle Pain Associated symptoms: no fever       Past Medical History:  Diagnosis Date   Asthma    Diabetes mellitus without complication ([place])    Hypertension    Stroke [facility])     Patient Active Problem List   Diagnosis Date Noted   CAP (community acquired pneumonia) 02/06/2021   COVID-19 08/08/2019   Acute respiratory failure with hypoxemia ([place]) 08/08/2019   History of CVA (cerebrovascular accident) 08/08/2019   Anemia 08/08/2019   CKD (chronic kidney disease), stage III ([place]) 08/08/2019   Dehydration    Vitamin B12 deficiency 02/10/2019   Unsteady gait 02/10/2019   Ischemic stroke ([place]) 12/06/2017   Diabetic polyneuropathy associated with type 2 diabetes mellitus ([place]) 12/06/2017    Past Surgical History:  Procedure Laterality Date   BREAST LUMPECTOMY Left    CESAREAN SECTION        OB History     Gravida  2   Para  2   Term      Preterm      AB      Living         SAB      IAB      Ectopic      Multiple      Live Births              History reviewed. No pertinent family history.  Social History   Tobacco Use   Smoking status: Former    Types: Cigarettes    Quit date: 1969    Years since quitting: 53.6   Smokeless tobacco: Never  Vaping Use   Vaping Use: Never used  Substance Use Topics   Alcohol use: Yes    Comment: occasionally   Drug use: Never    Home Medications Prior to Admission medications   Medication Sig Start Date End Date Taking? Authorizing Provider  acetaminophen (TYLENOL) 500 MG tablet Take 1,000 mg by mouth at bedtime.    [provider]  albuterol (VENTOLIN HFA) 108 (90 Base) MCG/ACT inhaler Inhale 2 puffs into the lungs every 6 (six) hours as needed for wheezing.    [provider]  Ascorbic Acid (VITAMIN C PO) Take 500 mg by mouth daily.     [provider]  atorvastatin (LIPITOR) 40 MG tablet Take 40 mg by mouth every evening.     [provider]  beclomethasone (QVAR) 40 MCG/ACT inhaler Inhale 1 puff into the lungs 2 (two) times daily.     [provider]  cetirizine (ZYRTEC) 5 MG tablet Take 5 mg by mouth daily at 12 noon.    [provider]  cholecalciferol (VITAMIN D) 1000 units tablet Take 1,000 Units by mouth daily.  Patient not taking: No sig reported    [provider]  clopidogrel (PLAVIX) 75 MG tablet Take 75 mg by mouth daily.     [provider]  colestipol (COLESTID) 1 g tablet Take by mouth. Patient not taking: No sig reported 04/27/20   [provider]  Cyanocobalamin ([employer] B12 QUICK DISSOLVE) 500 MCG LOZG Take 500 mcg by mouth daily.    [provider]  ferrous sulfate 325 (65 FE) MG tablet Take 325 mg by mouth 2 (two) times daily with a meal.     [provider]  gabapentin (NEURONTIN) 300  MG capsule Take 300 mg by mouth at bedtime.  11/26/17   [provider]  glipiZIDE (GLUCOTROL XL) 2.5 MG 24 hr tablet Take by mouth. 09/28/20   [provider]  hydrochlorothiazide (HYDRODIURIL) 25 MG tablet Take 25 mg by mouth daily.  11/26/17   [provider]  losartan (COZAAR) 50 MG tablet Take 50 mg by mouth daily. One tablet daily    [provider]  MAGNESIUM PO Take 250 mg by mouth daily.     [provider]  megestrol (MEGACE) 40 MG tablet Take by mouth. Patient not taking: No sig reported 06/04/20   [provider]  Melatonin 10 MG TABS Take 10 mg by mouth every evening. Patient not taking: No sig reported    [provider]  metFORMIN (GLUCOPHAGE) 1000 MG tablet Take 1,000 mg by mouth 2 (two) times daily with a meal. Breakfast and supper Patient not taking: No sig reported    [provider]  mirtazapine (REMERON) 15 MG tablet Take 7.5 mg by mouth every evening. Taken 7.'5mg'$  daily 04/25/18   [provider]  montelukast (SINGULAIR) 10 MG tablet Take 10 mg by mouth daily at 12 noon.     [provider]  Omega-3 1000 MG CAPS Take 1,000 mg by mouth daily.     [provider]  pantoprazole (PROTONIX) 40 MG tablet Take 40 mg by mouth daily.     [provider]  sertraline (ZOLOFT) 100 MG tablet Take 100 mg by mouth daily.  02/18/18   [provider]  zinc sulfate 220 (50 Zn) MG capsule Take 1 capsule (220 mg total) by mouth daily. Patient not taking: No sig reported 08/11/19   Georgette Shell, MD    Allergies    Patient has no known allergies.  Review of Systems   Review of Systems  Constitutional:  Negative for chills and fever.  HENT:  Negative for congestion and rhinorrhea.   Eyes:  Negative for redness and visual disturbance.  Respiratory:  Negative for shortness of breath and wheezing.   Cardiovascular:  Positive for leg swelling. Negative for chest pain and  palpitations.  Gastrointestinal:  Negative for nausea and vomiting.  Genitourinary:  Negative for dysuria and urgency.  Musculoskeletal:  Positive for arthralgias. Negative for myalgias.  Skin:  Negative for pallor and wound.  Neurological:  Positive for headaches. Negative for dizziness.   Physical Exam  Updated Vital Signs BP (!) 162/63 (BP Location: Right Arm)   Pulse 80   Temp 99.5 F (37.5 C) (Oral)   Resp 18   Ht '5\' 5"'$  (1.651 m)   Wt 68 kg   SpO2 98%   BMI 24.96 kg/m   Physical Exam Vitals and nursing note reviewed.  Constitutional:      General: She is not in acute distress.    Appearance: She is well-developed. She is not diaphoretic.  HENT:     Head: Normocephalic and atraumatic.  Eyes:     Pupils: Pupils are equal, round, and reactive to light.  Cardiovascular:     Rate and Rhythm: Normal rate and regular rhythm.     Heart sounds: No murmur heard.   No friction rub. No gallop.  Pulmonary:     Effort: Pulmonary effort is normal.     Breath sounds: No wheezing or rales.  Abdominal:     General: There is no distension.     Palpations: Abdomen is soft.     Tenderness: There is no abdominal tenderness.  Musculoskeletal:        General: Swelling and tenderness present.     Cervical back: Normal range of motion and neck supple.     Comments: Pain and swelling to the RLE.  Pulse motor and sensation are intact distally.  Patient has a laterally displaced ankle.  There is crepitus the medial and lateral malleolus.  No significant pain at the fibular neck.  No pain with range of motion of the right hip.  No pelvic instability.  No midline spinal tenderness.  Full range of motion of the upper and left lower extremity without pain.  Able to rotate her head 45 degrees in either direction without C-spine pain.  Skin:    General: Skin is warm and dry.  Neurological:     Mental Status: She is alert and oriented to person, place, and time.  Psychiatric:        Behavior:  Behavior normal.    ED Results / Procedures / Treatments   Labs (all labs ordered are listed, but only abnormal results are displayed) Labs Reviewed  CBC WITH DIFFERENTIAL/PLATELET - Abnormal; Notable for the following components:      Result Value   RBC 2.77 (*)    Hemoglobin 9.4 (*)    HCT 28.7 (*)    MCV 103.6 (*)    All other components within normal limits  COMPREHENSIVE METABOLIC PANEL - Abnormal; Notable for the following components:   Potassium 3.4 (*)    Glucose, Bld 139 (*)    BUN 30 (*)    Creatinine, Ser 2.13 (*)    Total Protein 6.1 (*)    GFR, Estimated 23 (*)    All other components within normal limits  CULTURE, BLOOD (ROUTINE X 2)  CULTURE, BLOOD (ROUTINE X 2)  RESP PANEL BY RT-PCR (FLU A&B, COVID) ARPGX2  URINALYSIS, ROUTINE W REFLEX MICROSCOPIC    EKG None  Radiology DG Tibia/Fibula Right  Result Date: 02/06/2021 CLINICAL DATA:  Fall, right ankle pain, right ankle swelling and deformity EXAM: RIGHT TIBIA AND FIBULA - 2 VIEW COMPARISON:  None. FINDINGS: A trimalleolar fracture dislocation of the right ankle is seen with posterolateral dislocation of the talar dome in relation to the tibial plafond. Mildly comminuted distal fibular fracture demonstrates moderate lateral and mild posterior angulation and extends to the level of the tibial plafond. Transverse medial malleolar fracture demonstrates roughly 1 cm lateral displacement of  the distal fracture fragment. Small posterior malleolar fracture fragment demonstrates 4 mm posterior and superior displacement. Extensive bimalleolar soft tissue swelling. IMPRESSION: Trimalleolar fracture dislocation of the right ankle as described above. Electronically Signed   By: Fidela Salisbury MD   On: 02/06/2021 20:20   DG Ankle Complete Right  Result Date: 02/06/2021 CLINICAL DATA:  Trimalleolar fracture postreduction. EXAM: RIGHT ANKLE - COMPLETE 3+ VIEW COMPARISON:  Pre reduction radiographs earlier today. FINDINGS: Improved  alignment of distal tibia and fibular fractures postreduction. There is decreased lateral subluxation of the talus and distal fracture fragments with respect to the tibial plafond. Generalized soft tissue edema. Overlying splint material in place. IMPRESSION: Improved alignment of distal tibia and fibular fractures postreduction with decreased lateral subluxation of the talus and distal fracture fragments with respect to the tibial plafond. Electronically Signed   By: Keith Rake M.D.   On: 02/06/2021 22:53   DG Ankle Complete Right  Result Date: 02/06/2021 CLINICAL DATA:  Right ankle pain following fall, right ankle swelling and deformity EXAM: RIGHT ANKLE - COMPLETE 3+ VIEW COMPARISON:  None. FINDINGS: A trimalleolar fracture dislocation of the right ankle is seen with posterolateral dislocation of the talar dome in relation to the tibial plafond. Mildly comminuted distal fibular fracture demonstrates moderate lateral and mild posterior angulation and extends to the level of the tibial plafond. Transverse medial malleolar fracture demonstrates roughly 1 cm lateral displacement of the distal fracture fragment. Small posterior malleolar fracture fragment demonstrates 4 mm posterior and superior displacement. Extensive bimalleolar soft tissue swelling IMPRESSION: Trimalleolar fracture dislocation of the right ankle as described above. Electronically Signed   By: Fidela Salisbury MD   On: 02/06/2021 20:21   CT Head Wo Contrast  Result Date: 02/06/2021 CLINICAL DATA:  Head trauma. EXAM: CT HEAD WITHOUT CONTRAST TECHNIQUE: Contiguous axial images were obtained from the base of the skull through the vertex without intravenous contrast. COMPARISON:  Brain CT February 04, 2021. FINDINGS: Brain: Ventricles and sulci are appropriate for patient's age. No evidence for acute cortically based infarct, intracranial hemorrhage, mass lesion or mass-effect. Vascular: Unremarkable Skull: Intact. Sinuses/Orbits: Paranasal  sinuses are well aerated. Mastoid air cells are unremarkable. Other: None. IMPRESSION: No acute intracranial process. Electronically Signed   By: Lovey Newcomer M.D.   On: 02/06/2021 20:25   DG Chest Port 1 View  Result Date: 02/06/2021 CLINICAL DATA:  Chest pain. EXAM: PORTABLE CHEST 1 VIEW COMPARISON:  Chest radiograph 06/07/2020 FINDINGS: Stable cardiac and mediastinal contours. Similar coarse interstitial opacities. Minimal consolidative opacity left lung base no large area pulmonary consolidation. No pleural effusion or pneumothorax. IMPRESSION: Minimal consolidative opacity left lung base may represent atelectasis or infection. Recommend short-term follow-up chest radiograph to ensure resolution. Similar coarse interstitial opacities may be secondary to underlying chronic process or pulmonary edema. Atypical infection not excluded. Electronically Signed   By: Lovey Newcomer M.D.   On: 02/06/2021 20:21    Procedures Reduction of dislocation  Date/Time: 02/06/2021 10:51 PM Performed by: Deno Etienne, DO Authorized by: Deno Etienne, DO  Consent: Verbal consent obtained. Risks and benefits: risks, benefits and alternatives were discussed Consent given by: patient Patient understanding: patient states understanding of the procedure being performed Imaging studies: imaging studies available Required items: required blood products, implants, devices, and special equipment available Time out: Immediately prior to procedure a "time out" was called to verify the correct patient, procedure, equipment, support staff and site/side marked as required. Preparation: Patient was prepped and draped in the usual sterile  fashion. Local anesthesia used: yes Anesthesia: local infiltration  Anesthesia: Local anesthesia used: yes Local Anesthetic: lidocaine 1% without epinephrine Anesthetic total: 20 mL  Sedation: Patient sedated: no  Patient tolerance: patient tolerated the procedure well with no immediate  complications     Medications Ordered in ED Medications  oxyCODONE (Oxy IR/ROXICODONE) immediate release tablet 2.5 mg (2.5 mg Oral Not Given 02/06/21 2038)  lidocaine-EPINEPHrine (XYLOCAINE W/EPI) 2 %-1:200000 (PF) injection 10 mL (has no administration in time range)  cefTRIAXone (ROCEPHIN) 1 g in sodium chloride 0.9 % 100 mL IVPB (0 g Intravenous Stopped 02/06/21 2249)  azithromycin (ZITHROMAX) 500 mg in sodium chloride 0.9 % 250 mL IVPB (500 mg Intravenous New Bag/Given 02/06/21 2251)  acetaminophen (TYLENOL) tablet 1,000 mg (1,000 mg Oral Given 02/06/21 2042)  fentaNYL (SUBLIMAZE) injection 50 mcg (50 mcg Intravenous Given 02/06/21 2041)  lidocaine (PF) (XYLOCAINE) 1 % injection (30 mLs  Given 02/06/21 2041)  fentaNYL (SUBLIMAZE) injection 50 mcg (50 mcg Intravenous Given 02/06/21 2213)    ED Course  I have reviewed the triage vital signs and the nursing notes.  Pertinent labs & imaging results that were available during my care of the patient were reviewed by me and considered in my medical decision making (see chart for details).    MDM Rules/Calculators/A&P                           81 yoF with a chief complaint of a fall.  This is now the patient second visit in about 48 hours for a fall.  Was seen at [facility] had a negative head CT and had her lac repaired.  Will obtain blood work EKG plain film of the right ankle and tib-fib.  She is coughing quite a bit on exam which she tells me is normal.  Will obtain an x-ray of the chest.  Repeat CT of the head.  Reassess.  Plain film viewed by me with fracture dislocation of the ankle.  Will reduce at bedside.  Chest x-ray viewed by me with left-sided focal infiltrate.  With patient having persistent cough will start on antibiotics.  We will discuss with hospitalist for admission.   Plain film of the ankle post reduction with improved alignment will discuss with ortho.   Discussed with Dr. Tamera Punt will see the patient in the  hospital. No emergent plans for operation while here.  The patients results and plan were reviewed and discussed.   Any x-rays performed were independently reviewed by myself.   Differential diagnosis were considered with the presenting HPI.  Medications  oxyCODONE (Oxy IR/ROXICODONE) immediate release tablet 2.5 mg (2.5 mg Oral Not Given 02/06/21 2038)  lidocaine-EPINEPHrine (XYLOCAINE W/EPI) 2 %-1:200000 (PF) injection 10 mL (has no administration in time range)  cefTRIAXone (ROCEPHIN) 1 g in sodium chloride 0.9 % 100 mL IVPB (0 g Intravenous Stopped 02/06/21 2249)  azithromycin (ZITHROMAX) 500 mg in sodium chloride 0.9 % 250 mL IVPB (500 mg Intravenous New Bag/Given 02/06/21 2251)  acetaminophen (TYLENOL) tablet 1,000 mg (1,000 mg Oral Given 02/06/21 2042)  fentaNYL (SUBLIMAZE) injection 50 mcg (50 mcg Intravenous Given 02/06/21 2041)  lidocaine (PF) (XYLOCAINE) 1 % injection (30 mLs  Given 02/06/21 2041)  fentaNYL (SUBLIMAZE) injection 50 mcg (50 mcg Intravenous Given 02/06/21 2213)    Vitals:   02/06/21 1921 02/06/21 1925 02/06/21 1927 02/06/21 2147  BP:  132/63  (!) 162/63  Pulse:  88  80  Resp:  20  18  Temp:  99.5 F (37.5 C)    TempSrc:  Oral    SpO2: 98% (!) 88%  98%  Weight:   68 kg   Height:   '5\' 5"'$  (1.651 m)     Final diagnoses:  Community acquired pneumonia of left lower lobe of lung  Closed fracture dislocation of right ankle, initial encounter    Admission/ observation were discussed with the admitting physician, patient and/or family and they are comfortable with the plan.    Final Clinical Impression(s) / ED Diagnoses Final diagnoses:  Community acquired pneumonia of left lower lobe of lung  Closed fracture dislocation of right ankle, initial encounter    Rx / DC Orders ED Discharge Orders     None        Deno Etienne, DO 02/06/21 2314

## 2021-02-06 NOTE — ED Notes (Signed)
Provider at beside.

## 2021-02-06 NOTE — Progress Notes (Signed)
Orthopedic Tech Progress Note Patient Details:  Ercel Stroh Nov 21, 1940 [id number]  Ortho Devices Type of Ortho Device: Post (short leg) splint, Stirrup splint Ortho Device/Splint Location: rle. Applied post reduction with drs assist. Ortho Device/Splint Interventions: Ordered, Application, Adjustment   Post Interventions Patient Tolerated: Well Instructions Provided: Care of device, Adjustment of device  Karolee Stamps 02/06/2021, 10:31 PM

## 2021-02-06 NOTE — ED Notes (Signed)
Returned from radiology at this time 

## 2021-02-06 NOTE — H&P (Signed)
History and Physical    Teresa Daniels EUM:[id number] DOB: June 24, 1941 DOA: 02/06/2021  PCP: [name], PA-C   Patient coming from: Home  Chief Complaint:  Weakness, cannnot bear weight on right leg  HPI: Teresa Daniels is a 80 y.o. female with medical history significant for HTN, DMT2, hx of CVA, CKD 4 who presents for evaluation of generalized weakness and has had multiple falls at home over the last 24 hours.  Daughter is at the bedside and reports that she found her mother on the floor around 10 AM this morning.  [name] stated she was trying to get out of bed and attempting to eat when she has became very weak and went down to the floor.  Daughter helped her back into bed that she could sleep more and when she came back at 5:00 she found her on the floor in the living room.  Patient reportedly gotten up and use her walker to try to get about the house which became very weak and fell.  She twisted her right ankle when she fell the second time.  Daughter found her around 5 PM with her profound weakness and not able to bear weight on her right leg she was brought to the emergency room for evaluation.  It is noted that she had a fall 2 days ago and sustained a small laceration on the back of her head that was seen [facility] and 2 staples were placed.  At that time head CT was negative.  She reports having a chronic cough which is unchanged.  The has not had any fever or chills.  She has not had any complaint of chest pain or pressure.  ED Course: Emergency room she been hemodynamically stable.  She was found to have a right ankle fracture and dislocation that was reduced in the emergency room and splint placed.  Orthopedic surgery was consulted.  Patient was found to have a left lower lobe pneumonia on chest x-ray and was started on empiric antibiotics.  Lab work revealed a creatinine of 2.13 which is elevated from her baseline of 1.77.  Sodium was 140, potassium 3.4, chloride  102, bicarb 30, calcium 9, alk phos is 92, AST 16, ALT 15.  WBC 4900, hemoglobin 9.4, hematocrit 28.7, platelets 180,000.  Glucose 139.  Hospitalist service was asked to admit for further management  Review of Systems:  General: Denies fever, chills, weight loss, night sweats.  Denies dizziness.  Denies change in appetite HENT: Denies head trauma, headache, denies change in hearing, tinnitus.  Denies nasal congestion or bleeding.  Denies sore throat, sores in mouth.  Denies difficulty swallowing Eyes: Denies blurry vision, pain in eye, drainage.  Denies discoloration of eyes. Neck: Denies pain.  Denies swelling.  Denies pain with movement. Cardiovascular: Denies chest pain, palpitations.  Denies edema.  Denies orthopnea Respiratory: Denies shortness of breath, cough.  Denies wheezing.  Denies sputum production Gastrointestinal: Denies abdominal pain, swelling.  Denies nausea, vomiting, diarrhea.  Denies melena.  Denies hematemesis. Musculoskeletal: Reports limitation of movement right ankle and foot. Denies myalgias. Genitourinary: Denies pelvic pain.  Denies urinary frequency or hesitancy.  Denies dysuria.  Skin: Denies rash.  Denies petechiae, purpura, ecchymosis. Neurological: Denies syncope.  Denies seizure activity. Denies slurred speech, drooping face.  Denies visual change. Psychiatric: Denies depression, anxiety.  Denies hallucinations.  Past Medical History:  Diagnosis Date   Asthma    Diabetes mellitus without complication ([place])    Hypertension  Stroke [facility])     Past Surgical History:  Procedure Laterality Date   BREAST LUMPECTOMY Left    CESAREAN SECTION      Social History  reports that she quit smoking about 53 years ago. Her smoking use included cigarettes. She has never used smokeless tobacco. She reports current alcohol use. She reports that she does not use drugs.  No Known Allergies  History reviewed. No pertinent family history.   Prior to Admission  medications   Medication Sig Start Date End Date Taking? Authorizing Provider  acetaminophen (TYLENOL) 500 MG tablet Take 1,000 mg by mouth at bedtime.    [provider]  albuterol (VENTOLIN HFA) 108 (90 Base) MCG/ACT inhaler Inhale 2 puffs into the lungs every 6 (six) hours as needed for wheezing.    [provider]  Ascorbic Acid (VITAMIN C PO) Take 500 mg by mouth daily.     [provider]  atorvastatin (LIPITOR) 40 MG tablet Take 40 mg by mouth every evening.     [provider]  beclomethasone (QVAR) 40 MCG/ACT inhaler Inhale 1 puff into the lungs 2 (two) times daily.     [provider]  cetirizine (ZYRTEC) 5 MG tablet Take 5 mg by mouth daily at 12 noon.    [provider]  cholecalciferol (VITAMIN D) 1000 units tablet Take 1,000 Units by mouth daily.  Patient not taking: No sig reported    [provider]  clopidogrel (PLAVIX) 75 MG tablet Take 75 mg by mouth daily.     [provider]  colestipol (COLESTID) 1 g tablet Take by mouth. Patient not taking: No sig reported 04/27/20   [provider]  Cyanocobalamin ([employer] B12 QUICK DISSOLVE) 500 MCG LOZG Take 500 mcg by mouth daily.    [provider]  ferrous sulfate 325 (65 FE) MG tablet Take 325 mg by mouth 2 (two) times daily with a meal.     [provider]  gabapentin (NEURONTIN) 300 MG capsule Take 300 mg by mouth at bedtime.  11/26/17   [provider]  glipiZIDE (GLUCOTROL XL) 2.5 MG 24 hr tablet Take by mouth. 09/28/20   [provider]  hydrochlorothiazide (HYDRODIURIL) 25 MG tablet Take 25 mg by mouth daily.  11/26/17   [provider]  losartan (COZAAR) 50 MG tablet Take 50 mg by mouth daily. One tablet daily    [provider]  MAGNESIUM PO Take 250 mg by mouth daily.     [provider]  megestrol (MEGACE) 40 MG tablet Take by mouth. Patient not taking: No sig reported 06/04/20    [provider]  Melatonin 10 MG TABS Take 10 mg by mouth every evening. Patient not taking: No sig reported    [provider]  metFORMIN (GLUCOPHAGE) 1000 MG tablet Take 1,000 mg by mouth 2 (two) times daily with a meal. Breakfast and supper Patient not taking: No sig reported    [provider]  mirtazapine (REMERON) 15 MG tablet Take 7.5 mg by mouth every evening. Taken 7.34m daily 04/25/18   [provider]  montelukast (SINGULAIR) 10 MG tablet Take 10 mg by mouth daily at 12 noon.     [provider]  Omega-3 1000 MG CAPS Take 1,000 mg by mouth daily.     [provider]  pantoprazole (PROTONIX) 40 MG tablet Take 40 mg by mouth daily.     [provider]  sertraline (ZOLOFT) 100 MG tablet Take 100  mg by mouth daily.  02/18/18   [provider]  zinc sulfate 220 (50 Zn) MG capsule Take 1 capsule (220 mg total) by mouth daily. Patient not taking: No sig reported 08/11/19   Georgette Shell, MD    Physical Exam: Vitals:   02/06/21 1921 02/06/21 1925 02/06/21 1927 02/06/21 2147  BP:  132/63  (!) 162/63  Pulse:  88  80  Resp:  20  18  Temp:  99.5 F (37.5 C)    TempSrc:  Oral    SpO2: 98% (!) 88%  98%  Weight:   68 kg   Height:   5' 5"  (1.651 m)     Constitutional: NAD, calm, comfortable Vitals:   02/06/21 1921 02/06/21 1925 02/06/21 1927 02/06/21 2147  BP:  132/63  (!) 162/63  Pulse:  88  80  Resp:  20  18  Temp:  99.5 F (37.5 C)    TempSrc:  Oral    SpO2: 98% (!) 88%  98%  Weight:   68 kg   Height:   5' 5"  (1.651 m)    General: WDWN, Alert and oriented x3.  Eyes: EOMI, PERRL, conjunctivae normal.  Sclera nonicteric HENT:  /AT, external ears normal.  Nares patent without epistasis.  Mucous membranes are moist. Neck: Soft, normal range of motion, supple, no masses,Trachea midline Respiratory: clear to auscultation bilaterally, no wheezing, no crackles. Normal respiratory effort. No accessory  muscle use.  Cardiovascular: Regular rate and rhythm, no murmurs / rubs / gallops.  Abdomen: Soft, no tenderness, nondistended, no rebound or guarding.  No masses palpated. Bowel sounds normoactive Musculoskeletal: Right ankle/leg/foot in splint. No cyanosis.  Normal muscle tone.  Skin: Warm, dry, intact no rashes, lesions, ulcers. No induration Neurologic: CN 2-12 grossly intact.  Normal speech.  Sensation intact, Strength 4/5 in all extremities.   Psychiatric: Normal judgment and insight.  Normal mood.    Labs on Admission: I have personally reviewed following labs and imaging studies  CBC: Recent Labs  Lab 02/06/21 1926  WBC 4.9  NEUTROABS 3.7  HGB 9.4*  HCT 28.7*  MCV 103.6*  PLT 885    Basic Metabolic Panel: Recent Labs  Lab 02/06/21 1926  NA 140  K 3.4*  CL 102  CO2 30  GLUCOSE 139*  BUN 30*  CREATININE 2.13*  CALCIUM 9.0    GFR: Estimated Creatinine Clearance: 19.3 mL/min (A) (by C-G formula based on SCr of 2.13 mg/dL (H)).  Liver Function Tests: Recent Labs  Lab 02/06/21 1926  AST 16  ALT 15  ALKPHOS 92  BILITOT 0.8  PROT 6.1*  ALBUMIN 3.6    Urine analysis:    Component Value Date/Time   COLORURINE YELLOW 06/07/2020 1850   APPEARANCEUR CLEAR 06/07/2020 1850   LABSPEC 1.015 06/07/2020 1850   PHURINE 6.0 06/07/2020 1850   GLUCOSEU NEGATIVE 06/07/2020 1850   HGBUR NEGATIVE 06/07/2020 1850   BILIRUBINUR NEGATIVE 06/07/2020 1850   KETONESUR NEGATIVE 06/07/2020 1850   PROTEINUR NEGATIVE 06/07/2020 1850   NITRITE NEGATIVE 06/07/2020 1850   LEUKOCYTESUR NEGATIVE 06/07/2020 1850    Radiological Exams on Admission: DG Tibia/Fibula Right  Result Date: 02/06/2021 CLINICAL DATA:  Fall, right ankle pain, right ankle swelling and deformity EXAM: RIGHT TIBIA AND FIBULA - 2 VIEW COMPARISON:  None. FINDINGS: A trimalleolar fracture dislocation of the right ankle is seen with posterolateral dislocation of the talar dome in relation to the tibial plafond.  Mildly comminuted distal fibular fracture demonstrates moderate lateral and mild  posterior angulation and extends to the level of the tibial plafond. Transverse medial malleolar fracture demonstrates roughly 1 cm lateral displacement of the distal fracture fragment. Small posterior malleolar fracture fragment demonstrates 4 mm posterior and superior displacement. Extensive bimalleolar soft tissue swelling. IMPRESSION: Trimalleolar fracture dislocation of the right ankle as described above. Electronically Signed   By: Fidela Salisbury MD   On: 02/06/2021 20:20   DG Ankle Complete Right  Result Date: 02/06/2021 CLINICAL DATA:  Trimalleolar fracture postreduction. EXAM: RIGHT ANKLE - COMPLETE 3+ VIEW COMPARISON:  Pre reduction radiographs earlier today. FINDINGS: Improved alignment of distal tibia and fibular fractures postreduction. There is decreased lateral subluxation of the talus and distal fracture fragments with respect to the tibial plafond. Generalized soft tissue edema. Overlying splint material in place. IMPRESSION: Improved alignment of distal tibia and fibular fractures postreduction with decreased lateral subluxation of the talus and distal fracture fragments with respect to the tibial plafond. Electronically Signed   By: Keith Rake M.D.   On: 02/06/2021 22:53   DG Ankle Complete Right  Result Date: 02/06/2021 CLINICAL DATA:  Right ankle pain following fall, right ankle swelling and deformity EXAM: RIGHT ANKLE - COMPLETE 3+ VIEW COMPARISON:  None. FINDINGS: A trimalleolar fracture dislocation of the right ankle is seen with posterolateral dislocation of the talar dome in relation to the tibial plafond. Mildly comminuted distal fibular fracture demonstrates moderate lateral and mild posterior angulation and extends to the level of the tibial plafond. Transverse medial malleolar fracture demonstrates roughly 1 cm lateral displacement of the distal fracture fragment. Small posterior malleolar  fracture fragment demonstrates 4 mm posterior and superior displacement. Extensive bimalleolar soft tissue swelling IMPRESSION: Trimalleolar fracture dislocation of the right ankle as described above. Electronically Signed   By: Fidela Salisbury MD   On: 02/06/2021 20:21   CT Head Wo Contrast  Result Date: 02/06/2021 CLINICAL DATA:  Head trauma. EXAM: CT HEAD WITHOUT CONTRAST TECHNIQUE: Contiguous axial images were obtained from the base of the skull through the vertex without intravenous contrast. COMPARISON:  Brain CT February 04, 2021. FINDINGS: Brain: Ventricles and sulci are appropriate for patient's age. No evidence for acute cortically based infarct, intracranial hemorrhage, mass lesion or mass-effect. Vascular: Unremarkable Skull: Intact. Sinuses/Orbits: Paranasal sinuses are well aerated. Mastoid air cells are unremarkable. Other: None. IMPRESSION: No acute intracranial process. Electronically Signed   By: Lovey Newcomer M.D.   On: 02/06/2021 20:25   DG Chest Port 1 View  Result Date: 02/06/2021 CLINICAL DATA:  Chest pain. EXAM: PORTABLE CHEST 1 VIEW COMPARISON:  Chest radiograph 06/07/2020 FINDINGS: Stable cardiac and mediastinal contours. Similar coarse interstitial opacities. Minimal consolidative opacity left lung base no large area pulmonary consolidation. No pleural effusion or pneumothorax. IMPRESSION: Minimal consolidative opacity left lung base may represent atelectasis or infection. Recommend short-term follow-up chest radiograph to ensure resolution. Similar coarse interstitial opacities may be secondary to underlying chronic process or pulmonary edema. Atypical infection not excluded. Electronically Signed   By: Lovey Newcomer M.D.   On: 02/06/2021 20:21     Assessment/Plan Principal Problem:   CAP (community acquired pneumonia) Ms. Feldstein is admitted to [place] floor. She is started on Rocephin and azithromycin for antibiotic coverage of pneumonia in the left lower lung. IV fluid hydration  with LR provided. Recheck electrolytes, renal function and CBC in morning  Active Problems:   Diabetic polyneuropathy associated with type 2 diabetes mellitus  Monitor blood sugar with meals and at bedtime. Glipizide held while admitted due to  risk of hypoglycemia.  Corrective insulin ordered as needed. Check HgbA1c    CKD (chronic kidney disease) stage 4, GFR 15-29 ml/min Stable IVF hydration with LR.  Check renal function with labs in am    Generalized weakness Fall precautions.  Consult PT    Fracture dislocation of right ankle Orthopedics consulted. Bedrest until ortho evaluates    History of CVA (cerebrovascular accident)     DVT prophylaxis: Heparin for DVT prophylaxis.   Code Status:   Full code  Family Communication:  Diagnosis and plan discussed with patient and her daughters at bedside.  Verbalized understanding and agree with plan.  Further recommendations to follow as clinically indicated Disposition Plan:   Patient is from:  Home  Anticipated DC to:  Home versus rehab, to be determined  Anticipated DC date:  Anticipate greater than 2 midnight stay in the hospita  Consults called:  Orthopedic surgery consulted by ER physician  Admission status:  Inpatient  Yevonne Aline Kazuki Ingle MD [facility]  How to contact the [facility] Attending or Consulting provider [place] or covering provider during after hours [place], for this patient?   Check the care team in [facility] and look for a) attending/consulting [facility] provider listed and b) the [facility] team listed Log into [email or web address] and use [place]'s universal password to access. If you do not have the password, please contact the hospital operator. Locate the [facility] provider you are looking for under [facility] and page to a number that you can be directly reached. If you still have difficulty reaching the provider, please page the [facility] (Director on Call) for the Hospitalists listed on amion for assistance.  02/06/2021,  11:24 PM

## 2021-02-07 ENCOUNTER — Inpatient Hospital Stay (HOSPITAL_COMMUNITY): Payer: Medicare PPO

## 2021-02-07 DIAGNOSIS — S82891A Other fracture of right lower leg, initial encounter for closed fracture: Secondary | ICD-10-CM | POA: Diagnosis not present

## 2021-02-07 DIAGNOSIS — R531 Weakness: Secondary | ICD-10-CM

## 2021-02-07 DIAGNOSIS — N184 Chronic kidney disease, stage 4 (severe): Secondary | ICD-10-CM | POA: Diagnosis not present

## 2021-02-07 DIAGNOSIS — Z8673 Personal history of transient ischemic attack (TIA), and cerebral infarction without residual deficits: Secondary | ICD-10-CM

## 2021-02-07 DIAGNOSIS — E1142 Type 2 diabetes mellitus with diabetic polyneuropathy: Secondary | ICD-10-CM | POA: Diagnosis not present

## 2021-02-07 DIAGNOSIS — J189 Pneumonia, unspecified organism: Secondary | ICD-10-CM | POA: Diagnosis not present

## 2021-02-07 LAB — CBC
HCT: 26.6 % — ABNORMAL LOW (ref 36.0–46.0)
Hemoglobin: 8.6 g/dL — ABNORMAL LOW (ref 12.0–15.0)
MCH: 33.7 pg (ref 26.0–34.0)
MCHC: 32.3 g/dL (ref 30.0–36.0)
MCV: 104.3 fL — ABNORMAL HIGH (ref 80.0–100.0)
Platelets: 152 10*3/uL (ref 150–400)
RBC: 2.55 MIL/uL — ABNORMAL LOW (ref 3.87–5.11)
RDW: 12.7 % (ref 11.5–15.5)
WBC: 3.9 10*3/uL — ABNORMAL LOW (ref 4.0–10.5)
nRBC: 0 % (ref 0.0–0.2)

## 2021-02-07 LAB — BASIC METABOLIC PANEL
Anion gap: 8 (ref 5–15)
BUN: 29 mg/dL — ABNORMAL HIGH (ref 8–23)
CO2: 30 mmol/L (ref 22–32)
Calcium: 8.4 mg/dL — ABNORMAL LOW (ref 8.9–10.3)
Chloride: 101 mmol/L (ref 98–111)
Creatinine, Ser: 1.89 mg/dL — ABNORMAL HIGH (ref 0.44–1.00)
GFR, Estimated: 27 mL/min — ABNORMAL LOW (ref >60)
Glucose, Bld: 191 mg/dL — ABNORMAL HIGH (ref 70–99)
Potassium: 3.3 mmol/L — ABNORMAL LOW (ref 3.5–5.1)
Sodium: 139 mmol/L (ref 135–145)

## 2021-02-07 LAB — RESP PANEL BY RT-PCR (FLU A&B, COVID) ARPGX2
Influenza A by PCR: NEGATIVE
Influenza B by PCR: NEGATIVE
SARS Coronavirus 2 by RT PCR: NEGATIVE

## 2021-02-07 LAB — HEMOGLOBIN A1C
Hgb A1c MFr Bld: 6.4 % — ABNORMAL HIGH (ref 4.8–5.6)
Mean Plasma Glucose: 136.98 mg/dL

## 2021-02-07 LAB — GLUCOSE, CAPILLARY
Glucose-Capillary: 166 mg/dL — ABNORMAL HIGH (ref 70–99)
Glucose-Capillary: 146 mg/dL — ABNORMAL HIGH (ref 70–99)
Glucose-Capillary: 164 mg/dL — ABNORMAL HIGH (ref 70–99)
Glucose-Capillary: 140 mg/dL — ABNORMAL HIGH (ref 70–99)
Glucose-Capillary: 161 mg/dL — ABNORMAL HIGH (ref 70–99)

## 2021-02-07 MED ORDER — SODIUM CHLORIDE 0.9 % IV SOLN: 500.0000 mg | INTRAVENOUS | Status: DC

## 2021-02-07 MED ORDER — SODIUM CHLORIDE 0.9 % IV SOLN: 1.0000 g | INTRAVENOUS | Status: DC

## 2021-02-07 MED ORDER — OXYCODONE HCL 5 MG PO TABS
5.0000 mg | ORAL_TABLET | Freq: Four times a day (QID) | ORAL | Status: DC | PRN
  Administered 2021-02-07 – 2021-02-10 (×6): 5 mg via ORAL
  Filled 2021-02-07 (×7): qty 1

## 2021-02-07 MED ORDER — INSULIN ASPART 100 UNIT/ML IJ SOLN: 0.0000 [IU] | Freq: Every day | INTRAMUSCULAR | Status: DC

## 2021-02-07 MED ORDER — ALBUTEROL SULFATE HFA 108 (90 BASE) MCG/ACT IN AERS
2.0000 | INHALATION_SPRAY | RESPIRATORY_TRACT | Status: DC | PRN
  Filled 2021-02-07: qty 6.7

## 2021-02-07 MED ORDER — INSULIN ASPART 100 UNIT/ML IJ SOLN
0.0000 [IU] | Freq: Three times a day (TID) | INTRAMUSCULAR | Status: DC
  Administered 2021-02-07 (×2): 1 [IU] via SUBCUTANEOUS
  Administered 2021-02-07: 2 [IU] via SUBCUTANEOUS
  Administered 2021-02-08: 1 [IU] via SUBCUTANEOUS
  Administered 2021-02-08: 2 [IU] via SUBCUTANEOUS
  Administered 2021-02-08 – 2021-02-16 (×16): 1 [IU] via SUBCUTANEOUS

## 2021-02-07 MED ORDER — AZITHROMYCIN 500 MG PO TABS
500.0000 mg | ORAL_TABLET | Freq: Every day | ORAL | Status: DC
  Administered 2021-02-07 – 2021-02-15 (×9): 500 mg via ORAL
  Filled 2021-02-07 (×10): qty 1

## 2021-02-07 MED ORDER — ONDANSETRON HCL 4 MG/2ML IJ SOLN: 4.0000 mg | Freq: Four times a day (QID) | INTRAMUSCULAR | Status: DC | PRN

## 2021-02-07 MED ORDER — ATORVASTATIN CALCIUM 40 MG PO TABS
40.0000 mg | ORAL_TABLET | Freq: Every evening | ORAL | Status: DC
  Administered 2021-02-07 – 2021-02-15 (×9): 40 mg via ORAL
  Filled 2021-02-07 (×9): qty 1

## 2021-02-07 MED ORDER — BUDESONIDE 0.5 MG/2ML IN SUSP
0.5000 mg | Freq: Two times a day (BID) | RESPIRATORY_TRACT | Status: DC
  Administered 2021-02-07 – 2021-02-16 (×17): 0.5 mg via RESPIRATORY_TRACT
  Filled 2021-02-07 (×18): qty 2

## 2021-02-07 MED ORDER — ACETAMINOPHEN 650 MG RE SUPP: 650.0000 mg | Freq: Four times a day (QID) | RECTAL | Status: DC | PRN

## 2021-02-07 MED ORDER — HYDROMORPHONE HCL 1 MG/ML IJ SOLN: 0.5000 mg | INTRAMUSCULAR | Status: DC | PRN

## 2021-02-07 MED ORDER — CLOPIDOGREL BISULFATE 75 MG PO TABS
75.0000 mg | ORAL_TABLET | Freq: Every day | ORAL | Status: DC
  Administered 2021-02-07 – 2021-02-16 (×9): 75 mg via ORAL
  Filled 2021-02-07 (×9): qty 1

## 2021-02-07 MED ORDER — SENNOSIDES-DOCUSATE SODIUM 8.6-50 MG PO TABS
1.0000 | ORAL_TABLET | Freq: Every evening | ORAL | Status: DC | PRN
  Administered 2021-02-07: 1 via ORAL
  Filled 2021-02-07: qty 1

## 2021-02-07 MED ORDER — HEPARIN SODIUM (PORCINE) 5000 UNIT/ML IJ SOLN
5000.0000 [IU] | Freq: Three times a day (TID) | INTRAMUSCULAR | Status: DC
  Administered 2021-02-07 – 2021-02-09 (×9): 5000 [IU] via SUBCUTANEOUS
  Filled 2021-02-07 (×9): qty 1

## 2021-02-07 MED ORDER — MONTELUKAST SODIUM 10 MG PO TABS
10.0000 mg | ORAL_TABLET | Freq: Every day | ORAL | Status: DC
  Administered 2021-02-07 – 2021-02-16 (×10): 10 mg via ORAL
  Filled 2021-02-07 (×10): qty 1

## 2021-02-07 MED ORDER — HYDROCHLOROTHIAZIDE 25 MG PO TABS
25.0000 mg | ORAL_TABLET | Freq: Every day | ORAL | Status: DC
  Administered 2021-02-07: 25 mg via ORAL
  Filled 2021-02-07: qty 1

## 2021-02-07 MED ORDER — LACTATED RINGERS IV SOLN: INTRAVENOUS | Status: DC

## 2021-02-07 MED ORDER — ACETAMINOPHEN 325 MG PO TABS
650.0000 mg | ORAL_TABLET | Freq: Four times a day (QID) | ORAL | Status: DC | PRN
  Administered 2021-02-07 – 2021-02-15 (×9): 650 mg via ORAL
  Filled 2021-02-07 (×9): qty 2

## 2021-02-07 MED ORDER — ONDANSETRON HCL 4 MG PO TABS: 4.0000 mg | ORAL_TABLET | Freq: Four times a day (QID) | ORAL | Status: DC | PRN

## 2021-02-07 NOTE — Progress Notes (Signed)
PT Cancellation Note  Patient Details Name: Teresa Daniels MRN: [id number] DOB: [birth year]   Cancelled Treatment:    Reason Eval/Treat Not Completed: Active bedrest order. Pt with found R ankle fracture, awaiting ortho consult. Pt currently on bedrest until medical plan determined. Acute PT to return as able, as appropriate to complete PT eval.  Kittie Plater, PT, DPT Acute Rehabilitation Services Pager #: [phone] Office #: [phone]    Berline Lopes 02/07/2021, 7:50 AM

## 2021-02-07 NOTE — Plan of Care (Signed)
?  Problem: Clinical Measurements: ?Goal: Will remain free from infection ?Outcome: Progressing ?  ?

## 2021-02-07 NOTE — Plan of Care (Signed)

## 2021-02-07 NOTE — Consult Note (Signed)
I have reviewed the patients chart/xrays. Bimalleolar ankle fracture will require surgical treatment, but would recommend optimizing medical issues and allowing soft tissue rest prior to proceeding.  The patient should keep the splint intact and remain nonweight bearing with frequent elevation and followup as an outpatient in our office with Dr. Lucia Gaskins once discharged.  Will need  a plan to come off of anticoagulation for surgery. If patient is to remain inpatient for an extended period, may consider surgery during this admission.

## 2021-02-07 NOTE — ED Notes (Signed)
Attempted to call report - no answer at this time-  

## 2021-02-07 NOTE — Evaluation (Signed)
Clinical/Bedside Swallow Evaluation Patient Details  Name: Teresa Daniels MRN: [id number] Date of Birth: [birth year]  Today's Date: 02/07/2021 Time: SLP Start Time (ACUTE ONLY): 24 SLP Stop Time (ACUTE ONLY): 1113 SLP Time Calculation (min) (ACUTE ONLY): 18 min  Past Medical History:  Past Medical History:  Diagnosis Date   Asthma    Diabetes mellitus without complication ([place])    Hypertension    Stroke [facility])    Past Surgical History:  Past Surgical History:  Procedure Laterality Date   BREAST LUMPECTOMY Left    CESAREAN SECTION     HPI:  Pt is a 80 y.o. female who presented for evaluation of generalized weakness and has had multiple falls at home over the last 24 hours.  Pt reports having a chronic cough which is unchanged. Chest xray (02/06/21) revealed "Minimal consolidative opacity left lung base may represent atelectasis or infection". Per RN note (02/07/21), "Pt noted to be coughing upon swallowing meds. Pt verbalized having difficulty taking pills at home". SLP consulted at that time. PMH: HTN, DMT2, hx of CVA, CKD 4, EGD (08/03/20- [facility]).   Assessment / Plan / Recommendation Clinical Impression  Pt seen for bedside swallow evaluation upright and alert in bed, complaining of pain in leg (notifed RN). Oral mechanism examination significant for xerostomia and missing, poorly conditioned dentition. Otherwise, oral motor function appeared WNL. Trials of ice chips x2 were without overt s/sx of aspiration, however when challenged with 3oz water swallow test, pt exhibited significant overt coughing after a long period of attempting to gasp for air. Small cups sips of thin were without overt s/sx. Attempted trials of puree solid post incident, however pt refused. RN reports pt coughing intermittenly with pureed solids during am meal. Recommend dysphagia 1 (puree), thin liquid (no straw) with pt to recieve full supervision for all PO intake. Modified Barium Swallow Study scheduled for this  afternoon. RN aware and in agreement.  SLP Visit Diagnosis: Dysphagia, unspecified (R13.10)    Aspiration Risk  Moderate aspiration risk    Diet Recommendation Dysphagia 1 (Puree);Thin liquid   Liquid Administration via: Cup;No straw Medication Administration: Crushed with puree Compensations: Minimize environmental distractions;Slow rate;Small sips/bites Postural Changes: Seated upright at 90 degrees    Other  Recommendations Oral Care Recommendations: Oral care BID;Staff/trained caregiver to provide oral care   Follow up Recommendations Other (comment) (TBD)      Frequency and Duration min 2x/week  2 weeks       Prognosis Prognosis for Safe Diet Advancement: Fair Barriers to Reach Goals: Time post onset      Swallow Study   General Date of Onset: 02/07/21 HPI: Pt is a 80 y.o. female who presented for evaluation of generalized weakness and has had multiple falls at home over the last 24 hours.  Pt reports having a chronic cough which is unchanged. Chest xray (02/06/21) revealed "Minimal consolidative opacity left lung base may represent atelectasis or infection". Per RN note (02/07/21), "Pt noted to be coughing upon swallowing meds. Pt verbalized having difficulty taking pills at home". SLP consulted at that time. PMH: HTN, DMT2, hx of CVA, CKD 4, EGD (08/03/20- [facility]). Type of Study: Bedside Swallow Evaluation Previous Swallow Assessment: none noted Diet Prior to this Study: Regular;Thin liquids Temperature Spikes Noted: Yes Respiratory Status: Nasal cannula History of Recent Intubation: No Behavior/Cognition: Alert;Cooperative;Pleasant mood;Confused Oral Cavity Assessment: Dry Oral Care Completed by SLP: No Oral Cavity - Dentition: Missing dentition;Dentures, bottom Vision: Functional for self-feeding Self-Feeding Abilities: Able to feed self Patient Positioning:  Upright in bed;Postural control adequate for testing Baseline Vocal Quality: Normal Volitional Cough:  Strong Volitional Swallow: Able to elicit    Oral/Motor/Sensory Function Overall Oral Motor/Sensory Function: Within functional limits   Ice Chips Ice chips: Within functional limits Presentation: Spoon   Thin Liquid Thin Liquid: Impaired Presentation: Cup;Straw;Self Fed Pharyngeal  Phase Impairments: Cough - Immediate    Nectar Thick Nectar Thick Liquid: Not tested   Honey Thick Honey Thick Liquid: Not tested   Puree Puree: Not tested   Solid     Solid: Not tested     Teresa Daniels, Teresa Daniels, Teresa Daniels Office Number: [phone]  Teresa Daniels 02/07/2021,12:06 PM

## 2021-02-07 NOTE — TOC Initial Note (Signed)
Transition of Care [facility]) - Initial/Assessment Note    Patient Details  Name: Teresa Daniels MRN: [id number] Date of Birth: 1941-05-08  Transition of Care [facility]) CM/SW Contact:    Sharin Mons, RN Phone Number: 02/07/2021, 12:42 PM  Clinical Narrative:   Presented with generalized weakness and multiple falls/ Fracture dislocation of right ankle/ laceration on the back of head.  Pt gave verbal consent to speak with daughter. NCM spoke with daughter Kellie Shropshire  regarding d/c planning.   Kellie Shropshire states pt resides alone @ [facility]. Kellie Shropshire states pt recently completed therapy with [facility] ( in house for [facility]).  Santiago Glad states if pt  unable to walk with min. assist @ d/c they will be interested in SNF/rehab..  PT evaluation pending....  Pt is COVID vaccinated with booster x 2  TOC team following for TOC needs.   Expected Discharge Plan: [place] (vs SNF placement) Barriers to Discharge: Continued Medical Work up   Patient Goals and CMS Choice     Choice offered to / list presented to : Patient, Adult Children  Expected Discharge Plan and Services Expected Discharge Plan: [place] (vs SNF placement)                                              Prior Living Arrangements/Services   Lives with:: Self Patient language and need for interpreter reviewed:: Yes        Need for Family Participation in Patient Care: Yes (Comment) Care giver support system in place?: Yes (comment) Current home services: DME ([facility]) Criminal Activity/Legal Involvement Pertinent to Current Situation/Hospitalization: No - Comment as needed  Activities of Daily Living      Permission Sought/Granted   Permission granted to share information with : Yes, Verbal Permission Granted  Share Information with NAME: Renee Rival (Daughter) [phone]           Emotional Assessment Appearance:: Appears stated  age Attitude/Demeanor/Rapport: Engaged Affect (typically observed): Accepting Orientation: : Oriented to Self, Oriented to Place, Oriented to  Time, Oriented to Situation Alcohol / Substance Use: Not Applicable Psych Involvement: No (comment)  Admission diagnosis:  CAP (community acquired pneumonia) [J18.9] Community acquired pneumonia of left lower lobe of lung [J18.9] Closed fracture dislocation of right ankle, initial encounter [S82.891A] Patient Active Problem List   Diagnosis Date Noted   CAP (community acquired pneumonia) 02/06/2021   CKD (chronic kidney disease) stage 4, GFR 15-29 ml/min ([place]) 02/06/2021   Generalized weakness 02/06/2021   Fracture dislocation of right ankle 02/06/2021   COVID-19 08/08/2019   Acute respiratory failure with hypoxemia ([place]) 08/08/2019   History of CVA (cerebrovascular accident) 08/08/2019   Anemia 08/08/2019   CKD (chronic kidney disease), stage III ([place]) 08/08/2019   Dehydration    Vitamin B12 deficiency 02/10/2019   Unsteady gait 02/10/2019   Ischemic stroke ([place]) 12/06/2017   Diabetic polyneuropathy associated with type 2 diabetes mellitus ([place]) 12/06/2017   PCP:  [name], PA-C Pharmacy:   [facility] [place], [place] - [address] [place] [address] [place] [address] Phone: [phone] Fax: [phone]     Social Determinants of Health (SDOH) Interventions    Readmission Risk Interventions No flowsheet data found.

## 2021-02-07 NOTE — Progress Notes (Signed)
Modified Barium Swallow Progress Note  Patient Details  Name: Teresa Daniels MRN: [id number] Date of Birth: [birth year]  Today's Date: 02/07/2021  Modified Barium Swallow completed.  Full report located under Chart Review in the Imaging Section.  Brief recommendations include the following:  Clinical Impression  Pt presents with oropharyngeal dysphagia marked by decreased bolus cohesion, premature spillage of liquids and intermittenlty reduced epiglottic inversion, resulting in aspiration of thin liquids via straw and with pill without sensation (PAS 8). Cued coughs did not completely expectorate aspirate. Small cup sips of thin revealed no notable penetration/aspiration. All solid POs also WFL and min pharyngeal residuals observed via fluoro. Recommend dysphagia 3/thin liquid diet (no straws). Meds crushed in puree.   Swallow Evaluation Recommendations       SLP Diet Recommendations: Dysphagia 3 (Mech soft) solids;Thin liquid   Liquid Administration via: Cup;No straw   Medication Administration: Crushed with puree   Supervision: Full supervision/cueing for compensatory strategies   Compensations: Minimize environmental distractions;Slow rate;Small sips/bites;Clear throat intermittently   Postural Changes: Remain semi-upright after after feeds/meals (Comment)   Oral Care Recommendations: Oral care BID;Staff/trained caregiver to provide oral care      Ellwood Dense, [place], [place] Office Number: [place] 02/07/2021,2:05 PM

## 2021-02-07 NOTE — ED Notes (Signed)
Cleaned and changed at this time

## 2021-02-07 NOTE — Progress Notes (Signed)
Pt arrived in the unit. Alert and oriented x4. Ace splint dressing in RLE. MSAD noted in groin, perineal and abdominal folds area. Pt temp.100.5 and tylenol was given. Pt noted to be coughing upon swallowing meds. Pt verbalized having difficulty taking pills at home. Will consult SLP to evaluate pt.

## 2021-02-07 NOTE — Progress Notes (Signed)
PROGRESS NOTE  Teresa Daniels [id number] DOB: 09-19-1940 DOA: 02/06/2021 PCP: [name], PA-C   LOS: 1 day   Brief narrative:  Teresa Daniels is a 80 y.o. female with medical history significant for HTN, DMT2, hx of CVA, CKD 4 presented to hospital with generalized weakness and multiple falls.  Patient had a fall 2 days HAART with this presentation and sustained a small laceration on the back of her head that was [facility] and 2 staples were placed.  At that time head CT was negative.  She reports having a chronic cough which is unchanged.  On this admission in the ED, patient was hemodynamically stable.  She was found to have a right ankle fracture and dislocation that was reduced in the emergency room and splint placed.  Orthopedic surgery was consulted.  Patient was found to have a left lower lobe pneumonia on chest x-ray and was started on empiric antibiotics.  Lab work revealed a creatinine of 2.13 which is elevated from her baseline of 1.77.  Sodium was 140, potassium 3.4, chloride 102, bicarb 30, calcium 9, alk phos is 92, AST 16, ALT 15.  WBC 4900, hemoglobin 9.4, hematocrit 28.7, platelets 180,000.  Glucose 139.  Was then admitted hospital for further evaluation and treatment.    Assessment/Plan:  Principal Problem:   CAP (community acquired pneumonia) Active Problems:   Diabetic polyneuropathy associated with type 2 diabetes mellitus ([place])   History of CVA (cerebrovascular accident)   CKD (chronic kidney disease) stage 4, GFR 15-29 ml/min ([facility])   Generalized weakness   Fracture dislocation of right ankle  CAP (community acquired pneumonia) Empirically on Rocephin and azithromycin received IV hydration.  We will wean oxygen as able   Diabetic polyneuropathy associated with type 2 diabetes mellitus Continue sliding scale insulin Accu-Cheks diabetic diet.  Hold glipizide.  HgbA1c of 6.4.   Mild acute kidney injury on chronic kidney disease stage 4,   received IV fluid hydration with improvement in creatinine levels.  Hold HCTZ at this time, reconsider IV fluids needed in AM.  Generalized weakness Will get PT evaluation but patient is currently nonweightbearing.     Fracture dislocation of right ankle with ankle pain. Orthopedics on board, nonweightbearing.  Continue splint.  Orthopedic recommends optimization of medical condition prior to consideration of surgery.     History of CVA (cerebrovascular accident) Supportive care.    DVT prophylaxis: heparin injection 5,000 Units Start: 02/07/21 0600   Code Status: Full code  Family Communication: None  Status is: Inpatient  Remains inpatient appropriate because:IV treatments appropriate due to intensity of illness or inability to take PO and Inpatient level of care appropriate due to severity of illness  Dispo: The patient is from: Home              Anticipated d/c is to: SNF              Patient currently is not medically stable to d/c.   Difficult to place patient No   Consultants: Orthopedics  Procedures: None  Anti-infectives:  Rocephin and Zithromax  Anti-infectives (From admission, onward)    Start     Dose/Rate Route Frequency Ordered Stop   02/07/21 0344  cefTRIAXone (ROCEPHIN) 1 g in sodium chloride 0.9 % 100 mL IVPB  Status:  Discontinued        1 g 200 mL/hr over 30 Minutes Intravenous Every 24 hours 02/07/21 0344 02/07/21 0349   02/07/21 0344  azithromycin (ZITHROMAX) 500  mg in sodium chloride 0.9 % 250 mL IVPB  Status:  Discontinued        500 mg 250 mL/hr over 60 Minutes Intravenous Every 24 hours 02/07/21 0344 02/07/21 0348   02/06/21 2100  cefTRIAXone (ROCEPHIN) 1 g in sodium chloride 0.9 % 100 mL IVPB        1 g 200 mL/hr over 30 Minutes Intravenous Every 24 hours 02/06/21 2049     02/06/21 2100  azithromycin (ZITHROMAX) 500 mg in sodium chloride 0.9 % 250 mL IVPB        500 mg 250 mL/hr over 60 Minutes Intravenous Every 24 hours 02/06/21 2049           Subjective: Today, patient was seen and examined at bedside.  Complains of ankle pain.  Has chronic cough.  Denies chest pain, sputum production or fever.  Objective: Vitals:   02/07/21 0700 02/07/21 0851  BP: (!) 111/52   Pulse: 72   Resp: 17   Temp: 98.3 F (36.8 C)   SpO2: 98% 97%    Intake/Output Summary (Last 24 hours) at 02/07/2021 1117 Last data filed at 02/07/2021 1043 Gross per 24 hour  Intake 470.42 ml  Output 1 ml  Net 469.42 ml   Filed Weights   02/06/21 1927  Weight: 68 kg   Body mass index is 24.96 kg/m.   Physical Exam: GENERAL: Patient is alert awake and oriented. Not in obvious distress.  On nasal cannula oxygen HENT: Mild pallor noted.  Pupils equally reactive to light. Oral mucosa is moist NECK: is supple, no gross swelling noted. CHEST: Clear to auscultation. No crackles or wheezes.  Diminished breath sounds bilaterally. CVS: S1 and S2 heard, no murmur. Regular rate and rhythm.  ABDOMEN: Soft, non-tender, bowel sounds are present. EXTREMITIES: No edema.  Right lower extremity in a splint CNS: Cranial nerves are intact. No focal motor deficits. SKIN: warm and dry without rashes.  Data Review: I have personally reviewed the following laboratory data and studies,  CBC: Recent Labs  Lab 02/06/21 1926 02/07/21 0345  WBC 4.9 3.9*  NEUTROABS 3.7  --   HGB 9.4* 8.6*  HCT 28.7* 26.6*  MCV 103.6* 104.3*  PLT 180 622   Basic Metabolic Panel: Recent Labs  Lab 02/06/21 1926 02/07/21 0345  NA 140 139  K 3.4* 3.3*  CL 102 101  CO2 30 30  GLUCOSE 139* 191*  BUN 30* 29*  CREATININE 2.13* 1.89*  CALCIUM 9.0 8.4*   Liver Function Tests: Recent Labs  Lab 02/06/21 1926  AST 16  ALT 15  ALKPHOS 92  BILITOT 0.8  PROT 6.1*  ALBUMIN 3.6   No results for input(s): LIPASE, AMYLASE in the last 168 hours. No results for input(s): AMMONIA in the last 168 hours. Cardiac Enzymes: No results for input(s): CKTOTAL, CKMB, CKMBINDEX, TROPONINI  in the last 168 hours. BNP (last 3 results) No results for input(s): BNP in the last 8760 hours.  ProBNP (last 3 results) No results for input(s): PROBNP in the last 8760 hours.  CBG: Recent Labs  Lab 02/07/21 0819  GLUCAP 166*   Recent Results (from the past 240 hour(s))  Blood culture (routine x 2)     Status: None (Preliminary result)   Collection Time: 02/06/21  9:15 PM   Specimen: BLOOD LEFT HAND  Result Value Ref Range Status   Specimen Description BLOOD LEFT HAND  Final   Special Requests   Final    BOTTLES DRAWN AEROBIC AND ANAEROBIC  Blood Culture results may not be optimal due to an inadequate volume of blood received in culture bottles   Culture   Final    NO GROWTH < 12 HOURS Performed at [facility] [address] [place], [place] [address]    Report Status PENDING  Incomplete  Blood culture (routine x 2)     Status: None (Preliminary result)   Collection Time: 02/06/21  9:15 PM   Specimen: BLOOD RIGHT HAND  Result Value Ref Range Status   Specimen Description BLOOD RIGHT HAND  Final   Special Requests   Final    BOTTLES DRAWN AEROBIC AND ANAEROBIC Blood Culture results may not be optimal due to an inadequate volume of blood received in culture bottles   Culture   Final    NO GROWTH < 12 HOURS Performed at [facility], [place] [address] [place], [place] [address]    Report Status PENDING  Incomplete  Resp Panel by RT-PCR (Flu A&B, Covid) Nasopharyngeal Swab     Status: None   Collection Time: 02/06/21 10:21 PM   Specimen: Nasopharyngeal Swab; Nasopharyngeal(NP) swabs in vial transport medium  Result Value Ref Range Status   SARS Coronavirus 2 by RT PCR NEGATIVE NEGATIVE Final    Comment: (NOTE) SARS-CoV-2 target nucleic acids are NOT DETECTED.  The SARS-CoV-2 RNA is generally detectable in upper respiratory specimens during the acute phase of infection. The lowest concentration of SARS-CoV-2 viral copies this assay can detect is 138  copies/mL. A negative result does not preclude SARS-Cov-2 infection and should not be used as the sole basis for treatment or other patient management decisions. A negative result may occur with  improper specimen collection/handling, submission of specimen other than nasopharyngeal swab, presence of viral mutation(s) within the areas targeted by this assay, and inadequate number of viral copies(<138 copies/mL). A negative result must be combined with clinical observations, patient history, and epidemiological information. The expected result is Negative.  Fact Sheet for Patients:  EntrepreneurPulse.com.au  Fact Sheet for Healthcare Providers:  IncredibleEmployment.be  This test is no t yet approved or cleared by the [place] FDA and  has been authorized for detection and/or diagnosis of SARS-CoV-2 by FDA under an Emergency Use Authorization (EUA). This EUA will remain  in effect (meaning this test can be used) for the duration of the COVID-19 declaration under Section 564(b)(1) of the Act, 21 U.S.C.section 360bbb-3(b)(1), unless the authorization is terminated  or revoked sooner.       Influenza A by PCR NEGATIVE NEGATIVE Final   Influenza B by PCR NEGATIVE NEGATIVE Final    Comment: (NOTE) The Xpert Xpress SARS-CoV-2/FLU/RSV plus assay is intended as an aid in the diagnosis of influenza from Nasopharyngeal swab specimens and should not be used as a sole basis for treatment. Nasal washings and aspirates are unacceptable for Xpert Xpress SARS-CoV-2/FLU/RSV testing.  Fact Sheet for Patients: EntrepreneurPulse.com.au  Fact Sheet for Healthcare Providers: IncredibleEmployment.be  This test is not yet approved or cleared by the [place] FDA and has been authorized for detection and/or diagnosis of SARS-CoV-2 by FDA under an Emergency Use Authorization (EUA). This EUA will remain in effect (meaning  this test can be used) for the duration of the COVID-19 declaration under Section 564(b)(1) of the Act, 21 U.S.C. section 360bbb-3(b)(1), unless the authorization is terminated or revoked.  Performed at [facility], [place] [address], [place], [place] [address]      Studies: DG Tibia/Fibula Right  Result  Date: 02/06/2021 CLINICAL DATA:  Fall, right ankle pain, right ankle swelling and deformity EXAM: RIGHT TIBIA AND FIBULA - 2 VIEW COMPARISON:  None. FINDINGS: A trimalleolar fracture dislocation of the right ankle is seen with posterolateral dislocation of the talar dome in relation to the tibial plafond. Mildly comminuted distal fibular fracture demonstrates moderate lateral and mild posterior angulation and extends to the level of the tibial plafond. Transverse medial malleolar fracture demonstrates roughly 1 cm lateral displacement of the distal fracture fragment. Small posterior malleolar fracture fragment demonstrates 4 mm posterior and superior displacement. Extensive bimalleolar soft tissue swelling. IMPRESSION: Trimalleolar fracture dislocation of the right ankle as described above. Electronically Signed   By: Fidela Salisbury MD   On: 02/06/2021 20:20   DG Ankle Complete Right  Result Date: 02/06/2021 CLINICAL DATA:  Trimalleolar fracture postreduction. EXAM: RIGHT ANKLE - COMPLETE 3+ VIEW COMPARISON:  Pre reduction radiographs earlier today. FINDINGS: Improved alignment of distal tibia and fibular fractures postreduction. There is decreased lateral subluxation of the talus and distal fracture fragments with respect to the tibial plafond. Generalized soft tissue edema. Overlying splint material in place. IMPRESSION: Improved alignment of distal tibia and fibular fractures postreduction with decreased lateral subluxation of the talus and distal fracture fragments with respect to the tibial plafond. Electronically Signed   By: Keith Rake M.D.   On: 02/06/2021 22:53   DG Ankle  Complete Right  Result Date: 02/06/2021 CLINICAL DATA:  Right ankle pain following fall, right ankle swelling and deformity EXAM: RIGHT ANKLE - COMPLETE 3+ VIEW COMPARISON:  None. FINDINGS: A trimalleolar fracture dislocation of the right ankle is seen with posterolateral dislocation of the talar dome in relation to the tibial plafond. Mildly comminuted distal fibular fracture demonstrates moderate lateral and mild posterior angulation and extends to the level of the tibial plafond. Transverse medial malleolar fracture demonstrates roughly 1 cm lateral displacement of the distal fracture fragment. Small posterior malleolar fracture fragment demonstrates 4 mm posterior and superior displacement. Extensive bimalleolar soft tissue swelling IMPRESSION: Trimalleolar fracture dislocation of the right ankle as described above. Electronically Signed   By: Fidela Salisbury MD   On: 02/06/2021 20:21   CT Head Wo Contrast  Result Date: 02/06/2021 CLINICAL DATA:  Head trauma. EXAM: CT HEAD WITHOUT CONTRAST TECHNIQUE: Contiguous axial images were obtained from the base of the skull through the vertex without intravenous contrast. COMPARISON:  Brain CT February 04, 2021. FINDINGS: Brain: Ventricles and sulci are appropriate for patient's age. No evidence for acute cortically based infarct, intracranial hemorrhage, mass lesion or mass-effect. Vascular: Unremarkable Skull: Intact. Sinuses/Orbits: Paranasal sinuses are well aerated. Mastoid air cells are unremarkable. Other: None. IMPRESSION: No acute intracranial process. Electronically Signed   By: Lovey Newcomer M.D.   On: 02/06/2021 20:25   DG Chest Port 1 View  Result Date: 02/06/2021 CLINICAL DATA:  Chest pain. EXAM: PORTABLE CHEST 1 VIEW COMPARISON:  Chest radiograph 06/07/2020 FINDINGS: Stable cardiac and mediastinal contours. Similar coarse interstitial opacities. Minimal consolidative opacity left lung base no large area pulmonary consolidation. No pleural effusion or  pneumothorax. IMPRESSION: Minimal consolidative opacity left lung base may represent atelectasis or infection. Recommend short-term follow-up chest radiograph to ensure resolution. Similar coarse interstitial opacities may be secondary to underlying chronic process or pulmonary edema. Atypical infection not excluded. Electronically Signed   By: Lovey Newcomer M.D.   On: 02/06/2021 20:21      Flora Lipps, MD  [facility] 02/07/2021  If 7PM-7AM, please contact night-coverage

## 2021-02-07 NOTE — ED Notes (Signed)
Pt O2 at 83 on room air, RN notified. Placing on 2L O2 before transport upstairs

## 2021-02-07 NOTE — ED Notes (Signed)
Pt placed on bedpan at this time.

## 2021-02-08 ENCOUNTER — Encounter (HOSPITAL_COMMUNITY): Payer: Self-pay | Admitting: Family Medicine

## 2021-02-08 DIAGNOSIS — S82891A Other fracture of right lower leg, initial encounter for closed fracture: Secondary | ICD-10-CM | POA: Diagnosis not present

## 2021-02-08 DIAGNOSIS — E1142 Type 2 diabetes mellitus with diabetic polyneuropathy: Secondary | ICD-10-CM | POA: Diagnosis not present

## 2021-02-08 DIAGNOSIS — N184 Chronic kidney disease, stage 4 (severe): Secondary | ICD-10-CM | POA: Diagnosis not present

## 2021-02-08 DIAGNOSIS — E876 Hypokalemia: Secondary | ICD-10-CM

## 2021-02-08 DIAGNOSIS — J189 Pneumonia, unspecified organism: Secondary | ICD-10-CM | POA: Diagnosis not present

## 2021-02-08 LAB — BASIC METABOLIC PANEL
Anion gap: 5 (ref 5–15)
BUN: 24 mg/dL — ABNORMAL HIGH (ref 8–23)
CO2: 32 mmol/L (ref 22–32)
Calcium: 8.4 mg/dL — ABNORMAL LOW (ref 8.9–10.3)
Chloride: 101 mmol/L (ref 98–111)
Creatinine, Ser: 1.61 mg/dL — ABNORMAL HIGH (ref 0.44–1.00)
GFR, Estimated: 32 mL/min — ABNORMAL LOW (ref >60)
Glucose, Bld: 147 mg/dL — ABNORMAL HIGH (ref 70–99)
Potassium: 3 mmol/L — ABNORMAL LOW (ref 3.5–5.1)
Sodium: 138 mmol/L (ref 135–145)

## 2021-02-08 LAB — CBC
HCT: 23.4 % — ABNORMAL LOW (ref 36.0–46.0)
Hemoglobin: 7.9 g/dL — ABNORMAL LOW (ref 12.0–15.0)
MCH: 34.5 pg — ABNORMAL HIGH (ref 26.0–34.0)
MCHC: 33.8 g/dL (ref 30.0–36.0)
MCV: 102.2 fL — ABNORMAL HIGH (ref 80.0–100.0)
Platelets: 156 10*3/uL (ref 150–400)
RBC: 2.29 MIL/uL — ABNORMAL LOW (ref 3.87–5.11)
RDW: 12.8 % (ref 11.5–15.5)
WBC: 5.9 10*3/uL (ref 4.0–10.5)
nRBC: 0 % (ref 0.0–0.2)

## 2021-02-08 LAB — PHOSPHORUS: Phosphorus: 3.3 mg/dL (ref 2.5–4.6)

## 2021-02-08 LAB — GLUCOSE, CAPILLARY
Glucose-Capillary: 151 mg/dL — ABNORMAL HIGH (ref 70–99)
Glucose-Capillary: 123 mg/dL — ABNORMAL HIGH (ref 70–99)
Glucose-Capillary: 127 mg/dL — ABNORMAL HIGH (ref 70–99)
Glucose-Capillary: 121 mg/dL — ABNORMAL HIGH (ref 70–99)
Glucose-Capillary: 120 mg/dL — ABNORMAL HIGH (ref 70–99)

## 2021-02-08 LAB — MAGNESIUM: Magnesium: 1.8 mg/dL (ref 1.7–2.4)

## 2021-02-08 MED ORDER — POTASSIUM CHLORIDE 10 MEQ/100ML IV SOLN
10.0000 meq | INTRAVENOUS | Status: AC
  Administered 2021-02-08 (×4): 10 meq via INTRAVENOUS
  Filled 2021-02-08 (×4): qty 100

## 2021-02-08 NOTE — Progress Notes (Signed)
PROGRESS NOTE  Teresa Daniels NID:[id number] DOB: [birth year] DOA: 02/06/2021 PCP: [name], PA-C   LOS: 2 days   Brief narrative:  Teresa Daniels is a 80 y.o. female with medical history significant for HTN, DMT2, hx of CVA, CKD 4 presented to hospital with generalized weakness and multiple falls.  Patient had a fall 2 days HAART with this presentation and sustained a small laceration on the back of her head that was seen [facility] and 2 staples were placed.  At that time head CT was negative.  She reports having a chronic cough which is unchanged.  On this admission in the ED, patient was hemodynamically stable.  She was found to have a right ankle fracture and dislocation that was reduced in the emergency room and splint placed.  Orthopedic surgery was consulted.  Patient was found to have a left lower lobe pneumonia on chest x-ray and was started on empiric antibiotics.  Lab work revealed a creatinine of 2.13 which is elevated from her baseline of 1.77.  Sodium was 140, potassium 3.4, chloride 102, bicarb 30, calcium 9, alk phos is 92, AST 16, ALT 15.  WBC 4900, hemoglobin 9.4, hematocrit 28.7, platelets 180,000.  Glucose 139.  Patient was then admitted to the hospital for further evaluation and treatment.    Assessment/Plan:  Principal Problem:   CAP (community acquired pneumonia) Active Problems:   Diabetic polyneuropathy associated with type 2 diabetes mellitus ([facility])   History of CVA (cerebrovascular accident)   CKD (chronic kidney disease) stage 4, GFR 15-29 ml/min (HCC)   Generalized weakness   Fracture dislocation of right ankle  CAP (community acquired pneumonia) Empirically on Rocephin and azithromycin,    Patient denies any shortness of breath dyspnea.  Discontinue oxygen.  Blood cultures was negative in 2 days.  Diabetic polyneuropathy associated with type 2 diabetes mellitus Continue sliding scale insulin Accu-Cheks diabetic diet.  Hold glipizide.  HgbA1c  of 6.4.  Hypokalemia.  We will replenish.  We will give 4 doses of IV KCl.  Check levels in a.m.   Mild acute kidney injury on chronic kidney disease stage 4,  received IV fluid hydration with improvement in creatinine levels.  Latest creatinine of 1.6.  We will discontinue IV fluids today.  Generalized weakness Physical therapy on board.     Fracture dislocation of right ankle with ankle pain. Orthopedics on board, nonweightbearing recommended by orthopedics.  Continue splint.  Orthopedic recommends optimization of medical condition prior to consideration of surgery.  Communicated with orthopedics today..  Orthopedic recommends discharge to rehabilitation and outpatient follow-up for surgical plan.  Will consult PT if for ambulation and nonweightbearing to the affected extremity after discussion with orthopedics.  No need to hold Plavix as per orthopedics.     History of CVA (cerebrovascular accident) Supportive care.  On Plavix as outpatient.    DVT prophylaxis: heparin injection 5,000 Units Start: 02/07/21 0600   Code Status: Full code  Family Communication: Spoke with the patient's daughter at bedside.  Status is: Inpatient  Remains inpatient appropriate because:IV treatments appropriate due to intensity of illness or inability to take PO and Inpatient level of care appropriate due to severity of illness  Dispo: The patient is from: Home              Anticipated d/c is to: SNF, physical therapy pending.              Patient currently is not medically stable to d/c.  Difficult to place patient No  Consultants: Orthopedics  Procedures: None  Anti-infectives:  Rocephin and Zithromax IV  Anti-infectives (From admission, onward)    Start     Dose/Rate Route Frequency Ordered Stop   02/07/21 2200  azithromycin (ZITHROMAX) tablet 500 mg        500 mg Oral Daily at bedtime 02/07/21 1541     02/07/21 0344  cefTRIAXone (ROCEPHIN) 1 g in sodium chloride 0.9 % 100 mL IVPB   Status:  Discontinued        1 g 200 mL/hr over 30 Minutes Intravenous Every 24 hours 02/07/21 0344 02/07/21 0349   02/07/21 0344  azithromycin (ZITHROMAX) 500 mg in sodium chloride 0.9 % 250 mL IVPB  Status:  Discontinued        500 mg 250 mL/hr over 60 Minutes Intravenous Every 24 hours 02/07/21 0344 02/07/21 0348   02/06/21 2100  cefTRIAXone (ROCEPHIN) 1 g in sodium chloride 0.9 % 100 mL IVPB        1 g 200 mL/hr over 30 Minutes Intravenous Every 24 hours 02/06/21 2049     02/06/21 2100  azithromycin (ZITHROMAX) 500 mg in sodium chloride 0.9 % 250 mL IVPB  Status:  Discontinued        500 mg 250 mL/hr over 60 Minutes Intravenous Every 24 hours 02/06/21 2049 02/07/21 1541      Subjective: Today, patient was seen and examined at bedside.  Complains of mild ankle pain.  Has a history of dysphagia from previous stroke  Objective: Vitals:   02/07/21 2110 02/08/21 0752  BP: (!) 124/54 126/68  Pulse:  77  Resp: 16 17  Temp: 98.7 F (37.1 C) 99.5 F (37.5 C)  SpO2: 96% 100%    Intake/Output Summary (Last 24 hours) at 02/08/2021 1055 Last data filed at 02/08/2021 0200 Gross per 24 hour  Intake 1651.7 ml  Output 1 ml  Net 1650.7 ml    Filed Weights   02/06/21 1927  Weight: 68 kg   Body mass index is 24.96 kg/m.   Physical Exam:  GENERAL: Patient is alert awake and oriented. Not in obvious distress.  Nasal cannula oxygen HENT: Mild pallor noted.  Pupils equally reactive to light. Oral mucosa is moist NECK: is supple, no gross swelling noted. CHEST: Clear to auscultation. No crackles or wheezes.  Diminished breath sounds bilaterally. CVS: S1 and S2 heard, no murmur. Regular rate and rhythm.  ABDOMEN: Soft, non-tender, bowel sounds are present. EXTREMITIES: No edema.  Right lower extremity in a splint. CNS: Cranial nerves are intact. No focal motor deficits. SKIN: warm and dry without rashes.  Data Review: I have personally reviewed the following laboratory data and  studies,  CBC: Recent Labs  Lab 02/06/21 1926 02/07/21 0345 02/08/21 0151  WBC 4.9 3.9* 5.9  NEUTROABS 3.7  --   --   HGB 9.4* 8.6* 7.9*  HCT 28.7* 26.6* 23.4*  MCV 103.6* 104.3* 102.2*  PLT 180 152 431    Basic Metabolic Panel: Recent Labs  Lab 02/06/21 1926 02/07/21 0345 02/08/21 0151  NA 140 139 138  K 3.4* 3.3* 3.0*  CL 102 101 101  CO2 30 30 32  GLUCOSE 139* 191* 147*  BUN 30* 29* 24*  CREATININE 2.13* 1.89* 1.61*  CALCIUM 9.0 8.4* 8.4*  MG  --   --  1.8  PHOS  --   --  3.3    Liver Function Tests: Recent Labs  Lab 02/06/21 1926  AST 16  ALT  15  ALKPHOS 92  BILITOT 0.8  PROT 6.1*  ALBUMIN 3.6    No results for input(s): LIPASE, AMYLASE in the last 168 hours. No results for input(s): AMMONIA in the last 168 hours. Cardiac Enzymes: No results for input(s): CKTOTAL, CKMB, CKMBINDEX, TROPONINI in the last 168 hours. BNP (last 3 results) No results for input(s): BNP in the last 8760 hours.  ProBNP (last 3 results) No results for input(s): PROBNP in the last 8760 hours.  CBG: Recent Labs  Lab 02/07/21 1150 02/07/21 1504 02/07/21 1710 02/07/21 2133 02/08/21 0616  GLUCAP 146* 164* 140* 161* 151*    Recent Results (from the past 240 hour(s))  Blood culture (routine x 2)     Status: None (Preliminary result)   Collection Time: 02/06/21  9:15 PM   Specimen: BLOOD LEFT HAND  Result Value Ref Range Status   Specimen Description BLOOD LEFT HAND  Final   Special Requests   Final    BOTTLES DRAWN AEROBIC AND ANAEROBIC Blood Culture results may not be optimal due to an inadequate volume of blood received in culture bottles   Culture   Final    NO GROWTH 2 DAYS Performed at [facility], [place] [address] [place], [place] [address]    Report Status PENDING  Incomplete  Blood culture (routine x 2)     Status: None (Preliminary result)   Collection Time: 02/06/21  9:15 PM   Specimen: BLOOD RIGHT HAND  Result Value Ref Range Status   Specimen  Description BLOOD RIGHT HAND  Final   Special Requests   Final    BOTTLES DRAWN AEROBIC AND ANAEROBIC Blood Culture results may not be optimal due to an inadequate volume of blood received in culture bottles   Culture   Final    NO GROWTH 2 DAYS Performed at [facility], [place] [address], [place], [place] [address]    Report Status PENDING  Incomplete  Resp Panel by RT-PCR (Flu A&B, Covid) Nasopharyngeal Swab     Status: None   Collection Time: 02/06/21 10:21 PM   Specimen: Nasopharyngeal Swab; Nasopharyngeal(NP) swabs in vial transport medium  Result Value Ref Range Status   SARS Coronavirus 2 by RT PCR NEGATIVE NEGATIVE Final    Comment: (NOTE) SARS-CoV-2 target nucleic acids are NOT DETECTED.  The SARS-CoV-2 RNA is generally detectable in upper respiratory specimens during the acute phase of infection. The lowest concentration of SARS-CoV-2 viral copies this assay can detect is 138 copies/mL. A negative result does not preclude SARS-Cov-2 infection and should not be used as the sole basis for treatment or other patient management decisions. A negative result may occur with  improper specimen collection/handling, submission of specimen other than nasopharyngeal swab, presence of viral mutation(s) within the areas targeted by this assay, and inadequate number of viral copies(<138 copies/mL). A negative result must be combined with clinical observations, patient history, and epidemiological information. The expected result is Negative.  Fact Sheet for Patients:  EntrepreneurPulse.com.au  Fact Sheet for Healthcare Providers:  IncredibleEmployment.be  This test is no t yet approved or cleared by the [place] FDA and  has been authorized for detection and/or diagnosis of SARS-CoV-2 by FDA under an Emergency Use Authorization (EUA). This EUA will remain  in effect (meaning this test can be used) for the duration of the COVID-19  declaration under Section 564(b)(1) of the Act, 21 U.S.C.section 360bbb-3(b)(1), unless the authorization is terminated  or revoked sooner.  Influenza A by PCR NEGATIVE NEGATIVE Final   Influenza B by PCR NEGATIVE NEGATIVE Final    Comment: (NOTE) The Xpert Xpress SARS-CoV-2/FLU/RSV plus assay is intended as an aid in the diagnosis of influenza from Nasopharyngeal swab specimens and should not be used as a sole basis for treatment. Nasal washings and aspirates are unacceptable for Xpert Xpress SARS-CoV-2/FLU/RSV testing.  Fact Sheet for Patients: EntrepreneurPulse.com.au  Fact Sheet for Healthcare Providers: IncredibleEmployment.be  This test is not yet approved or cleared by the [place] FDA and has been authorized for detection and/or diagnosis of SARS-CoV-2 by FDA under an Emergency Use Authorization (EUA). This EUA will remain in effect (meaning this test can be used) for the duration of the COVID-19 declaration under Section 564(b)(1) of the Act, 21 U.S.C. section 360bbb-3(b)(1), unless the authorization is terminated or revoked.  Performed at [facility], [place] [address], [place], [place] [address]       Studies: DG Tibia/Fibula Right  Result Date: 02/06/2021 CLINICAL DATA:  Fall, right ankle pain, right ankle swelling and deformity EXAM: RIGHT TIBIA AND FIBULA - 2 VIEW COMPARISON:  None. FINDINGS: A trimalleolar fracture dislocation of the right ankle is seen with posterolateral dislocation of the talar dome in relation to the tibial plafond. Mildly comminuted distal fibular fracture demonstrates moderate lateral and mild posterior angulation and extends to the level of the tibial plafond. Transverse medial malleolar fracture demonstrates roughly 1 cm lateral displacement of the distal fracture fragment. Small posterior malleolar fracture fragment demonstrates 4 mm posterior and superior displacement. Extensive  bimalleolar soft tissue swelling. IMPRESSION: Trimalleolar fracture dislocation of the right ankle as described above. Electronically Signed   By: Fidela Salisbury MD   On: 02/06/2021 20:20   DG Ankle Complete Right  Result Date: 02/06/2021 CLINICAL DATA:  Trimalleolar fracture postreduction. EXAM: RIGHT ANKLE - COMPLETE 3+ VIEW COMPARISON:  Pre reduction radiographs earlier today. FINDINGS: Improved alignment of distal tibia and fibular fractures postreduction. There is decreased lateral subluxation of the talus and distal fracture fragments with respect to the tibial plafond. Generalized soft tissue edema. Overlying splint material in place. IMPRESSION: Improved alignment of distal tibia and fibular fractures postreduction with decreased lateral subluxation of the talus and distal fracture fragments with respect to the tibial plafond. Electronically Signed   By: Keith Rake M.D.   On: 02/06/2021 22:53   DG Ankle Complete Right  Result Date: 02/06/2021 CLINICAL DATA:  Right ankle pain following fall, right ankle swelling and deformity EXAM: RIGHT ANKLE - COMPLETE 3+ VIEW COMPARISON:  None. FINDINGS: A trimalleolar fracture dislocation of the right ankle is seen with posterolateral dislocation of the talar dome in relation to the tibial plafond. Mildly comminuted distal fibular fracture demonstrates moderate lateral and mild posterior angulation and extends to the level of the tibial plafond. Transverse medial malleolar fracture demonstrates roughly 1 cm lateral displacement of the distal fracture fragment. Small posterior malleolar fracture fragment demonstrates 4 mm posterior and superior displacement. Extensive bimalleolar soft tissue swelling IMPRESSION: Trimalleolar fracture dislocation of the right ankle as described above. Electronically Signed   By: Fidela Salisbury MD   On: 02/06/2021 20:21   CT Head Wo Contrast  Result Date: 02/06/2021 CLINICAL DATA:  Head trauma. EXAM: CT HEAD WITHOUT  CONTRAST TECHNIQUE: Contiguous axial images were obtained from the base of the skull through the vertex without intravenous contrast. COMPARISON:  Brain CT February 04, 2021. FINDINGS: Brain: Ventricles and sulci are appropriate for patient's age. No evidence for  acute cortically based infarct, intracranial hemorrhage, mass lesion or mass-effect. Vascular: Unremarkable Skull: Intact. Sinuses/Orbits: Paranasal sinuses are well aerated. Mastoid air cells are unremarkable. Other: None. IMPRESSION: No acute intracranial process. Electronically Signed   By: Lovey Newcomer M.D.   On: 02/06/2021 20:25   DG Chest Port 1 View  Result Date: 02/06/2021 CLINICAL DATA:  Chest pain. EXAM: PORTABLE CHEST 1 VIEW COMPARISON:  Chest radiograph 06/07/2020 FINDINGS: Stable cardiac and mediastinal contours. Similar coarse interstitial opacities. Minimal consolidative opacity left lung base no large area pulmonary consolidation. No pleural effusion or pneumothorax. IMPRESSION: Minimal consolidative opacity left lung base may represent atelectasis or infection. Recommend short-term follow-up chest radiograph to ensure resolution. Similar coarse interstitial opacities may be secondary to underlying chronic process or pulmonary edema. Atypical infection not excluded. Electronically Signed   By: Lovey Newcomer M.D.   On: 02/06/2021 20:21   DG Swallowing Func-Speech Pathology  Result Date: 02/07/2021 Formatting of this result is different from the original. Objective Swallowing Evaluation: Type of Study: MBS-Modified Barium Swallow Study  Patient Details Name: Peace Noyes MRN: [id number] Date of Birth: 1941/03/23 Today's Date: 02/07/2021 Time: SLP Start Time (ACUTE ONLY): 73 -SLP Stop Time (ACUTE ONLY): 1250 SLP Time Calculation (min) (ACUTE ONLY): 15 min Past Medical History: Past Medical History: Diagnosis Date  Asthma   Diabetes mellitus without complication ([place])   Hypertension   Stroke [facility])  Past Surgical History: Past Surgical History:  Procedure Laterality Date  BREAST LUMPECTOMY Left   CESAREAN SECTION   HPI: Pt is a 80 y.o. female who presented for evaluation of generalized weakness and has had multiple falls at home over the last 24 hours.  Pt reports having a chronic cough which is unchanged. Chest xray (02/06/21) revealed "Minimal consolidative opacity left lung base may represent atelectasis or infection". Per RN note (02/07/21), "Pt noted to be coughing upon swallowing meds. Pt verbalized having difficulty taking pills at home". SLP consulted at that time. PMH: HTN, DMT2, hx of CVA, CKD 4, EGD (08/03/20- [facility]).  No data recorded Assessment / Plan / Recommendation CHL IP CLINICAL IMPRESSIONS 02/07/2021 Clinical Impression Pt presents with oropharyngeal dysphagia marked by decreased bolus cohesion, premature spillage of liquids and intermittenlty reduced epiglottic inversion, resulting in aspiration of thin liquids via straw and with pill without sensation (PAS 8). Cued coughs did not completely expectorate aspirate. Small cup sips of thin revealed no notable penetration/aspiration. All solid POs also WFL and min pharyngeal residuals observed via fluoro. Recommend dysphagia 3/thin liquid diet (no straws). Meds crushed in puree. SLP Visit Diagnosis Dysphagia, oropharyngeal phase (R13.12) Attention and concentration deficit following -- Frontal lobe and executive function deficit following -- Impact on safety and function Moderate aspiration risk   CHL IP TREATMENT RECOMMENDATION 02/07/2021 Treatment Recommendations Therapy as outlined in treatment plan below   Prognosis 02/07/2021 Prognosis for Safe Diet Advancement Fair Barriers to Reach Goals Time post onset Barriers/Prognosis Comment -- CHL IP DIET RECOMMENDATION 02/07/2021 SLP Diet Recommendations Dysphagia 3 (Mech soft) solids;Thin liquid Liquid Administration via Cup;No straw Medication Administration Crushed with puree Compensations Minimize environmental distractions;Slow rate;Small  sips/bites;Clear throat intermittently Postural Changes Remain semi-upright after after feeds/meals (Comment)   CHL IP OTHER RECOMMENDATIONS 02/07/2021 Recommended Consults -- Oral Care Recommendations Oral care BID;Staff/trained caregiver to provide oral care Other Recommendations --   CHL IP FOLLOW UP RECOMMENDATIONS 02/07/2021 Follow up Recommendations Other (comment)   CHL IP FREQUENCY AND DURATION 02/07/2021 Speech Therapy Frequency (ACUTE ONLY) min 2x/week Treatment Duration 2 weeks  CHL IP ORAL PHASE 02/07/2021 Oral Phase Impaired Oral - Pudding Teaspoon -- Oral - Pudding Cup -- Oral - Honey Teaspoon -- Oral - Honey Cup -- Oral - Nectar Teaspoon -- Oral - Nectar Cup Decreased bolus cohesion;Premature spillage Oral - Nectar Straw -- Oral - Thin Teaspoon -- Oral - Thin Cup Decreased bolus cohesion;Premature spillage Oral - Thin Straw Decreased bolus cohesion;Premature spillage Oral - Puree WFL Oral - Mech Soft -- Oral - Regular WFL Oral - Multi-Consistency -- Oral - Pill WFL Oral Phase - Comment --  CHL IP PHARYNGEAL PHASE 02/07/2021 Pharyngeal Phase Impaired Pharyngeal- Pudding Teaspoon -- Pharyngeal -- Pharyngeal- Pudding Cup -- Pharyngeal -- Pharyngeal- Honey Teaspoon -- Pharyngeal -- Pharyngeal- Honey Cup -- Pharyngeal -- Pharyngeal- Nectar Teaspoon -- Pharyngeal -- Pharyngeal- Nectar Cup [facility] Pharyngeal Material does not enter airway Pharyngeal- Nectar Straw NT Pharyngeal -- Pharyngeal- Thin Teaspoon NT Pharyngeal -- Pharyngeal- Thin Cup Reduced epiglottic inversion;Pharyngeal residue - valleculae;Compensatory strategies attempted (with notebox) Pharyngeal Material does not enter airway Pharyngeal- Thin Straw Penetration/Aspiration during swallow;Reduced epiglottic inversion;Pharyngeal residue - valleculae;Compensatory strategies attempted (with notebox);Moderate aspiration;Reduced airway/laryngeal closure Pharyngeal Material enters airway, passes BELOW cords and not ejected out despite cough attempt by patient  Pharyngeal- Puree WFL Pharyngeal -- Pharyngeal- Mechanical Soft -- Pharyngeal -- Pharyngeal- Regular WFL Pharyngeal -- Pharyngeal- Multi-consistency -- Pharyngeal -- Pharyngeal- Pill Penetration/Aspiration before swallow;Moderate aspiration;Reduced airway/laryngeal closure Pharyngeal -- Pharyngeal Comment --  CHL IP CERVICAL ESOPHAGEAL PHASE 02/07/2021 Cervical Esophageal Phase WFL Pudding Teaspoon -- Pudding Cup -- Honey Teaspoon -- Honey Cup -- Nectar Teaspoon -- Nectar Cup -- Nectar Straw -- Thin Teaspoon -- Thin Cup -- Thin Straw -- Puree -- Mechanical Soft -- Regular -- Multi-consistency -- Pill -- Cervical Esophageal Comment -- Ellwood Dense, MA, CCC-SLP Acute Rehabilitation Services Office Number: [phone] Acie Fredrickson 02/07/2021, 3:00 PM                 Flora Lipps, MD  [facility] 02/08/2021  If 7PM-7AM, please contact night-coverage

## 2021-02-08 NOTE — Plan of Care (Addendum)

## 2021-02-08 NOTE — Progress Notes (Signed)
  Speech Language Pathology Treatment: Dysphagia  Patient Details Name: Teresa Daniels MRN: [id number] DOB: 1940/08/30 Today's Date: 02/08/2021 Time: [id number] SLP Time Calculation (min) (ACUTE ONLY): 29 min  Assessment / Plan / Recommendation Clinical Impression  Teresa Daniels present for treatment session this date with daughter at bedside providing Teresa Daniels PLOF in regards to swallow function. Daughter provided results of previous MBS performed in different state after previous CVA. No straws with thin liquid consumption was recommended at that time and daughter states that she doesn't use a straw at home. Reviewed yesterday's MBS with Teresa Daniels and daughter and subsequent recommendations, which are similar to previous MBS recommendations, emphasizing that Teresa Daniels continues to be at risk for aspiration with use of straw. Teresa Daniels and daughter expressed agreement. Trials of thin liquids via cup with min verbal cues for small sips resulted in x1 overt coughing across x4 trials. Puree and regular textured solid unremarkable for s/sx of aspiration, however daughter prefers dysphagia 3 diet due to Teresa Daniels's current dental status. Encourage thorough oral care prior to meals to further reduce risk for aspiration PNA. SLP to f/u to ensure tolerance and more independent use of strategies.    HPI HPI: Teresa Daniels is a 80 y.o. female who presented for evaluation of generalized weakness and has had multiple falls at home over the last 24 hours.  Teresa Daniels reports having a chronic cough which is unchanged. Chest xray (02/06/21) revealed "Minimal consolidative opacity left lung base may represent atelectasis or infection". Per RN note (02/07/21), "Teresa Daniels noted to be coughing upon swallowing meds. Teresa Daniels verbalized having difficulty taking pills at home". SLP consulted at that time. MBS (02/07/21) revealed aspiration of thin liquids via straw. Dys 3/thin (no straw) recommended. PMH: HTN, DMT2, hx of CVA, CKD 4, EGD (08/03/20- [facility]).      SLP Plan  Continue with current plan of  care       Recommendations  Diet recommendations: Dysphagia 3 (mechanical soft);Thin liquid Liquids provided via: No straw;Cup Medication Administration: Crushed with puree Supervision: Full supervision/cueing for compensatory strategies;Patient able to self feed Compensations: Minimize environmental distractions;Slow rate;Small sips/bites;Clear throat intermittently;Follow solids with liquid Postural Changes and/or Swallow Maneuvers: Seated upright 90 degrees                Oral Care Recommendations: Other (Comment) (Oral care prior to each meal if not BID) Follow up Recommendations: Other (comment) (TBD) SLP Visit Diagnosis: Dysphagia, oropharyngeal phase (R13.12) Plan: Continue with current plan of care       [place], [place], [place] Office Number: [phone]   Acie Fredrickson 02/08/2021, 9:23 AM

## 2021-02-08 NOTE — Evaluation (Signed)
Physical Therapy Evaluation Patient Details Name: Teresa Daniels MRN: [id number] DOB: 1940/10/18 Today's Date: 02/08/2021   History of Present Illness  80 yo female with onset of a fall and head laceration was treated with staples, but returns with another fall and sustained a trimalleolar fracture on the R ankle.  Also dx'd with CAP, ABT started; low K+ replenished; AKI with hydration.  PMHx:  CVA, polyneuropathy, CKD4, weakness,  Clinical Impression  Pt was seen for minor intervention to initiate mobility on side of bed.  Pt is wearing splint on RLE and is able to move with help, but will not be strong enough to stand and maintain NWB on RLE.  Talked with her about using a drop arm chair as she is able to scoot on the bed using UE's and LLE.  Pt will continue acute rehab goals, and focus on discussion with her about safety since family has not committed to the discharge location yet for this pt. [place] in [place].  May have different instructions for dc if the ortho md decides to do ORIF.      Follow Up Recommendations SNF;Supervision for mobility/OOB;Supervision/Assistance - 24 hour    Equipment Recommendations  None recommended by PT    Recommendations for Other Services       Precautions / Restrictions Precautions Precautions: Fall Precaution Comments: prev struck her head Required Braces or Orthoses: Splint/Cast Splint/Cast: 02/07/21 Restrictions Weight Bearing Restrictions: Yes RLE Weight Bearing: Non weight bearing Other Position/Activity Restrictions: elevate when in bed      Mobility  Bed Mobility Overal bed mobility: Needs Assistance Bed Mobility: Supine to Sit;Sit to Supine     Supine to sit: Mod assist Sit to supine: Mod assist   General bed mobility comments: mod assist to protect LLE    Transfers Overall transfer level: Needs assistance Equipment used: 1 person hand held assist;Rolling walker (2  wheeled) Transfers: Sit to/from Stand;Lateral/Scoot Transfers Sit to Stand: Max assist (attempted but cannot do)        Lateral/Scoot Transfers: Min assist;From elevated surface (to maintain NWB on RLE) General transfer comment: pt scoots well but in transition to stand is unable to use just LLE  Ambulation/Gait             General Gait Details: deferred  Stairs            Wheelchair Mobility    Modified Rankin (Stroke Patients Only)       Balance                                             Pertinent Vitals/Pain Pain Assessment: Faces Faces Pain Scale: Hurts a little bit Pain Location: R foot Pain Descriptors / Indicators: Guarding Pain Intervention(s): Monitored during session;Repositioned;Premedicated before session    Home Living Family/patient expects to be discharged to:: Other (Comment) (independent living) Living Arrangements: Alone Available Help at Discharge: Family;Friend(s);Available PRN/intermittently Type of Home: House Home Access: Level entry     Home Layout: One level Home Equipment: Walker - 2 wheels      Prior Function Level of Independence: Independent with assistive device(s);Needs assistance   Gait / Transfers Assistance Needed: RW with independence  ADL's / Homemaking Assistance Needed: [place] assistance in [place] with [place] and cleaning  Comments: has family support in [place]  Dominant Hand: Right    Extremity/Trunk Assessment   Upper Extremity Assessment Upper Extremity Assessment: Overall WFL for tasks assessed    Lower Extremity Assessment Lower Extremity Assessment: RLE deficits/detail RLE Deficits / Details: splint on RLE RLE: Unable to fully assess due to immobilization RLE Coordination: decreased gross motor    Cervical / Trunk Assessment Cervical / Trunk Assessment: Kyphotic (mild)  Communication   Communication: No difficulties  Cognition Arousal/Alertness:  Awake/alert Behavior During Therapy: WFL for tasks assessed/performed Overall Cognitive Status: Within Functional Limits for tasks assessed                                 General Comments: following instructions      General Comments General comments (skin integrity, edema, etc.): pt has a splint on RLE to maintain her posture of R ankle, potentially having ORIF soon    Exercises     Assessment/Plan    PT Assessment Patient needs continued PT services  PT Problem List Decreased strength;Decreased range of motion;Decreased activity tolerance;Decreased balance;Decreased mobility;Decreased coordination;Decreased knowledge of use of DME;Decreased skin integrity;Pain       PT Treatment Interventions DME instruction;Functional mobility training;Therapeutic activities;Therapeutic exercise;Balance training;Neuromuscular re-education;Patient/family education    PT Goals (Current goals can be found in the Care Plan section)  Acute Rehab PT Goals Patient Stated Goal: to go directly home PT Goal Formulation: With patient/family Time For Goal Achievement: 02/22/21 Potential to Achieve Goals: Fair    Frequency Min 3X/week   Barriers to discharge Inaccessible home environment;Decreased caregiver support home in [place] with NWB on RLE    Co-evaluation               AM-PAC PT "6 Clicks" Mobility  Outcome Measure Help needed turning from your back to your side while in a flat bed without using bedrails?: A Lot Help needed moving from lying on your back to sitting on the side of a flat bed without using bedrails?: A Lot Help needed moving to and from a bed to a chair (including a wheelchair)?: A Lot Help needed standing up from a chair using your arms (e.g., wheelchair or bedside chair)?: Total Help needed to walk in hospital room?: Total Help needed climbing 3-5 steps with a railing? : Total 6 Click Score: 9    End of Session Equipment Utilized During Treatment: Gait  belt Activity Tolerance: Patient limited by fatigue;Treatment limited secondary to medical complications (Comment) (NWB is difficult to maintain on just LLE) Patient left: in bed;with call bell/phone within reach;with bed alarm set;with family/visitor present;with nursing/sitter in room Nurse Communication: Mobility status;Other (comment) (limits of her current situation) PT Visit Diagnosis: Muscle weakness (generalized) (M62.81);History of falling (Z91.81);Pain Pain - Right/Left: Right Pain - part of body: Ankle and joints of foot    Time: [id number] PT Time Calculation (min) (ACUTE ONLY): 31 min   Charges:   PT Evaluation $PT Eval Moderate Complexity: 1 Mod PT Treatments $Therapeutic Activity: 8-22 mins       Ramond Dial 02/08/2021, 10:02 PM  Mee Hives, PT MS Acute Rehab Dept. Number: [place]

## 2021-02-09 DIAGNOSIS — E1142 Type 2 diabetes mellitus with diabetic polyneuropathy: Secondary | ICD-10-CM | POA: Diagnosis not present

## 2021-02-09 DIAGNOSIS — S82891A Other fracture of right lower leg, initial encounter for closed fracture: Secondary | ICD-10-CM | POA: Diagnosis not present

## 2021-02-09 DIAGNOSIS — J189 Pneumonia, unspecified organism: Secondary | ICD-10-CM | POA: Diagnosis not present

## 2021-02-09 DIAGNOSIS — N184 Chronic kidney disease, stage 4 (severe): Secondary | ICD-10-CM | POA: Diagnosis not present

## 2021-02-09 LAB — BASIC METABOLIC PANEL
Anion gap: 11 (ref 5–15)
BUN: 19 mg/dL (ref 8–23)
CO2: 28 mmol/L (ref 22–32)
Calcium: 8.7 mg/dL — ABNORMAL LOW (ref 8.9–10.3)
Chloride: 100 mmol/L (ref 98–111)
Creatinine, Ser: 1.42 mg/dL — ABNORMAL HIGH (ref 0.44–1.00)
GFR, Estimated: 38 mL/min — ABNORMAL LOW (ref >60)
Glucose, Bld: 128 mg/dL — ABNORMAL HIGH (ref 70–99)
Potassium: 3.4 mmol/L — ABNORMAL LOW (ref 3.5–5.1)
Sodium: 139 mmol/L (ref 135–145)

## 2021-02-09 LAB — GLUCOSE, CAPILLARY
Glucose-Capillary: 145 mg/dL — ABNORMAL HIGH (ref 70–99)
Glucose-Capillary: 134 mg/dL — ABNORMAL HIGH (ref 70–99)
Glucose-Capillary: 127 mg/dL — ABNORMAL HIGH (ref 70–99)
Glucose-Capillary: 120 mg/dL — ABNORMAL HIGH (ref 70–99)
Glucose-Capillary: 160 mg/dL — ABNORMAL HIGH (ref 70–99)

## 2021-02-09 LAB — CBC
HCT: 24.6 % — ABNORMAL LOW (ref 36.0–46.0)
Hemoglobin: 8.3 g/dL — ABNORMAL LOW (ref 12.0–15.0)
MCH: 33.9 pg (ref 26.0–34.0)
MCHC: 33.7 g/dL (ref 30.0–36.0)
MCV: 100.4 fL — ABNORMAL HIGH (ref 80.0–100.0)
Platelets: 153 10*3/uL (ref 150–400)
RBC: 2.45 MIL/uL — ABNORMAL LOW (ref 3.87–5.11)
RDW: 12.4 % (ref 11.5–15.5)
WBC: 5 10*3/uL (ref 4.0–10.5)
nRBC: 0 % (ref 0.0–0.2)

## 2021-02-09 LAB — MAGNESIUM: Magnesium: 1.7 mg/dL (ref 1.7–2.4)

## 2021-02-09 MED ORDER — POTASSIUM CHLORIDE CRYS ER 20 MEQ PO TBCR
40.0000 meq | EXTENDED_RELEASE_TABLET | Freq: Once | ORAL | Status: AC
  Administered 2021-02-09: 40 meq via ORAL
  Filled 2021-02-09: qty 2

## 2021-02-09 MED ORDER — SODIUM CHLORIDE 0.9 % IV SOLN: INTRAVENOUS | Status: DC

## 2021-02-09 NOTE — Plan of Care (Signed)

## 2021-02-09 NOTE — Progress Notes (Signed)
Physical Therapy Treatment Patient Details Name: Teresa Daniels MRN: [id number] DOB: 10/11/1940 Today's Date: 02/09/2021    History of Present Illness 80 yo female with onset of a fall and head laceration was treated with staples, but returns with another fall and sustained a trimalleolar fracture on the R ankle.  Also dx'd with CAP, ABT started; low K+ replenished; AKI with hydration.  PMHx:  CVA, polyneuropathy, CKD4, weakness,    PT Comments    Pt making steady progress towards her PT goals as she was able to come to a full stand several times with modA today. However, pt displays difficulty maintaining NWB on her R lower extremity, needing physical assistance to do so. In addition, she has difficulty pivoting on her L foot for transfers, needing maxAx2 to perform transfers between surfaces. Will continue to follow acutely. Current recommendations remain appropriate.    Follow Up Recommendations  SNF;Supervision/Assistance - 24 hour     Equipment Recommendations  None recommended by PT    Recommendations for Other Services       Precautions / Restrictions Precautions Precautions: Fall Precaution Comments: prev struck her head Required Braces or Orthoses: Splint/Cast Splint/Cast: 02/07/21 Restrictions Weight Bearing Restrictions: Yes RLE Weight Bearing: Non weight bearing Other Position/Activity Restrictions: elevate when in bed    Mobility  Bed Mobility Overal bed mobility: Needs Assistance Bed Mobility: Supine to Sit     Supine to sit: Min guard;HOB elevated     General bed mobility comments: Extra time and cues to use L bedrail to assist with pulling trunk to sit up EOB, min guard assist.    Transfers Overall transfer level: Needs assistance Equipment used: 2 person hand held assist Transfers: Stand Pivot Transfers;Sit to/from Stand Sit to Stand: Mod assist;+2 safety/equipment Stand pivot transfers: Max assist;+2 physical assistance;+2 safety/equipment        General transfer comment: Drop arm recliner obtained for increased ease and safety with pivot transfer. Pt needing physical assistance to keep R foot off the ground throughout, modA to power up and steady with sit to stand 1x from EOB, 1x from recliner, and 2x from commode. MaxAx2 to stand pivot to L 2x bed > recliner > commode and 1x to R commode > recliner, cuing for hand placement and transition between surfaces and pivoting on foot. Poor L foot pivot technique needing directing.  Ambulation/Gait             General Gait Details: Unable   Stairs             Wheelchair Mobility    Modified Rankin (Stroke Patients Only)       Balance Overall balance assessment: Needs assistance Sitting-balance support: No upper extremity supported;Feet supported Sitting balance-Leahy Scale: Fair     Standing balance support: Bilateral upper extremity supported;During functional activity Standing balance-Leahy Scale: Poor Standing balance comment: UE support and external physical assistance needed to stand.                            Cognition Arousal/Alertness: Awake/alert Behavior During Therapy: WFL for tasks assessed/performed Overall Cognitive Status: Within Functional Limits for tasks assessed                                 General Comments: following instructions, difficulty sequencing without cues      Exercises General Exercises - Lower Extremity Long Arc Quad: Both;10 reps;Seated  General Comments        Pertinent Vitals/Pain Pain Assessment: No/denies pain    Home Living                      Prior Function            PT Goals (current goals can now be found in the care plan section) Acute Rehab PT Goals Patient Stated Goal: to use the restroom PT Goal Formulation: With patient Time For Goal Achievement: 02/22/21 Potential to Achieve Goals: Fair Progress towards PT goals: Progressing toward goals     Frequency    Min 3X/week      PT Plan Current plan remains appropriate    Co-evaluation              AM-PAC PT "6 Clicks" Mobility   Outcome Measure  Help needed turning from your back to your side while in a flat bed without using bedrails?: A Little Help needed moving from lying on your back to sitting on the side of a flat bed without using bedrails?: A Little Help needed moving to and from a bed to a chair (including a wheelchair)?: Total Help needed standing up from a chair using your arms (e.g., wheelchair or bedside chair)?: A Lot Help needed to walk in hospital room?: Total Help needed climbing 3-5 steps with a railing? : Total 6 Click Score: 11    End of Session Equipment Utilized During Treatment: Gait belt Activity Tolerance: Patient limited by fatigue;Other (comment) (NWB is difficult to maintain on just LLE) Patient left: with call bell/phone within reach;with nursing/sitter in room;in chair;with chair alarm set Nurse Communication: Mobility status PT Visit Diagnosis: Muscle weakness (generalized) (M62.81);History of falling (Z91.81);Pain;Unsteadiness on feet (R26.81);Difficulty in walking, not elsewhere classified (R26.2) Pain - Right/Left: Right Pain - part of body: Ankle and joints of foot     Time: [id number] PT Time Calculation (min) (ACUTE ONLY): 35 min  Charges:  $Therapeutic Activity: 23-37 mins                     Moishe Spice, PT, DPT Acute Rehabilitation Services  Pager: [phone] Office: [place] 02/09/2021, 10:30 AM

## 2021-02-09 NOTE — Plan of Care (Signed)
Pt. Has shown progress with eating and shows understanding of pain management used. Luvenia Starch, RN Problem: Education: Goal: Knowledge of General Education information will improve Description: Including pain rating scale, medication(s)/side effects and non-pharmacologic comfort measures Outcome: Progressing   Problem: Health Behavior/Discharge Planning: Goal: Ability to manage health-related needs will improve Outcome: Progressing   Problem: Nutrition: Goal: Adequate nutrition will be maintained Outcome: Progressing

## 2021-02-09 NOTE — Progress Notes (Signed)
PROGRESS NOTE  Teresa Daniels [id number] DOB: 1941/04/26 DOA: 02/06/2021 PCP: [name], PA-C   LOS: 3 days   Brief narrative: Teresa Daniels is a 80 y.o. female with medical history significant for HTN, DMT2, hx of CVA, CKD 4 presented to hospital with generalized weakness and multiple falls.  Patient had a fall 2 days HAART with this presentation and sustained a small laceration on the back of her head that was [facility] and 2 staples were placed.  At that time head CT was negative.  She reports having a chronic cough which is unchanged.  On this admission in the ED, patient was hemodynamically stable.  She was found to have a right ankle fracture and dislocation that was reduced in the emergency room and splint placed.  Orthopedic surgery was consulted.  Patient was found to have a left lower lobe pneumonia on chest x-ray and was started on empiric antibiotics.  Lab work revealed a creatinine of 2.13 which is elevated from her baseline of 1.77.  Sodium was 140, potassium 3.4, chloride 102, bicarb 30, calcium 9, alk phos is 92, AST 16, ALT 15.  WBC 4900, hemoglobin 9.4, hematocrit 28.7, platelets 180,000.  Glucose 139.  Patient was then admitted to the hospital for further evaluation and treatment.    Assessment/Plan:  Principal Problem:   CAP (community acquired pneumonia) Active Problems:   Diabetic polyneuropathy associated with type 2 diabetes mellitus ([place])   History of CVA (cerebrovascular accident)   CKD (chronic kidney disease) stage 4, GFR 15-29 ml/min (HCC)   Generalized weakness   Fracture dislocation of right ankle  Community acquired pneumonia Empirically on IV Rocephin and azithromycin.   Clinically improved.  We will continue antibiotic complete 5-day course.  Patient denies any shortness of breath dyspnea.  Off oxygen at this time.  Blood cultures was negative in 3 days.  Diabetic polyneuropathy associated with type 2 diabetes mellitus Continue  sliding scale insulin, Accu-Cheks diabetic diet.  Continue to hold glipizide.  Latest HgbA1c of 6.4.  Hypokalemia.  Mild.  We will continue to replenish.  Received 1 dose of p.o. potassium this morning.  We will repeat again.  Check levels in a.m.  Mild acute kidney injury on chronic kidney disease stage 4,  received IV fluid hydration with improvement in creatinine levels.  Latest creatinine of 1.4.  Off IV fluids at this time.  Will restart after midnight.  Generalized weakness Physical therapy on board.  Likely need rehabilitation placement.     Fracture dislocation of right ankle with ankle pain. Orthopedics on board, nonweightbearing recommended by orthopedics.  Continue splint.  Communicated with orthopedic again today.  Plan for surgical intervention likely tomorrow.    History of CVA (cerebrovascular accident) Supportive care.  On Plavix    DVT prophylaxis: heparin injection 5,000 Units Start: 02/07/21 0600   Code Status: Full code  Family Communication: I again spoke with the patient's daughter at length at bedside and on the phone.  Status is: Inpatient  Remains inpatient appropriate because:IV treatments appropriate due to intensity of illness or inability to take PO and Inpatient level of care appropriate due to severity of illness, plan for surgical intervention.  Dispo: The patient is from: Home              Anticipated d/c is to: SNF, physical therapy  after surgical intervention.              Patient currently is not medically stable  to d/c.   Difficult to place patient No  Consultants: Orthopedics  Procedures: None  Anti-infectives:  Rocephin and Zithromax IV  Anti-infectives (From admission, onward)    Start     Dose/Rate Route Frequency Ordered Stop   02/07/21 2200  azithromycin (ZITHROMAX) tablet 500 mg        500 mg Oral Daily at bedtime 02/07/21 1541     02/07/21 0344  cefTRIAXone (ROCEPHIN) 1 g in sodium chloride 0.9 % 100 mL IVPB  Status:   Discontinued        1 g 200 mL/hr over 30 Minutes Intravenous Every 24 hours 02/07/21 0344 02/07/21 0349   02/07/21 0344  azithromycin (ZITHROMAX) 500 mg in sodium chloride 0.9 % 250 mL IVPB  Status:  Discontinued        500 mg 250 mL/hr over 60 Minutes Intravenous Every 24 hours 02/07/21 0344 02/07/21 0348   02/06/21 2100  cefTRIAXone (ROCEPHIN) 1 g in sodium chloride 0.9 % 100 mL IVPB        1 g 200 mL/hr over 30 Minutes Intravenous Every 24 hours 02/06/21 2049     02/06/21 2100  azithromycin (ZITHROMAX) 500 mg in sodium chloride 0.9 % 250 mL IVPB  Status:  Discontinued        500 mg 250 mL/hr over 60 Minutes Intravenous Every 24 hours 02/06/21 2049 02/07/21 1541      Subjective: Today, patient was seen and examined at bedside.  Complains of mild ankle pain.  Has mild wheezing from history of difficulty swallowing.  No dyspnea or shortness of breath.  Objective: Vitals:   02/09/21 0841 02/09/21 1122  BP:  (!) 153/69  Pulse: 64 64  Resp: 16 17  Temp:  97.6 F (36.4 C)  SpO2: 92% 98%    Intake/Output Summary (Last 24 hours) at 02/09/2021 1310 Last data filed at 02/08/2021 1700 Gross per 24 hour  Intake 832.42 ml  Output --  Net 832.42 ml    Filed Weights   02/06/21 1927  Weight: 68 kg   Body mass index is 24.96 kg/m.   Physical Exam:  General:  Average built, not in obvious distress, alert awake communicative HENT:   Mild pallor noted.. Oral mucosa is moist.  Chest:  Clear breath sounds.  Diminished breath sounds bilaterally. No crackles or wheezes.  CVS: S1 &S2 heard. No murmur.  Regular rate and rhythm. Abdomen: Soft, nontender, nondistended.  Bowel sounds are heard.   Extremities: No cyanosis, clubbing or edema.  Peripheral pulses are palpable.  Lower extremity in a splint Psych: Alert, awake and oriented, normal mood CNS:  No cranial nerve deficits.  Power equal in all extremities.   Skin: Warm and dry.  No rashes noted.   Data Review: I have personally  reviewed the following laboratory data and studies,  CBC: Recent Labs  Lab 02/06/21 1926 02/07/21 0345 02/08/21 0151 02/09/21 0213  WBC 4.9 3.9* 5.9 5.0  NEUTROABS 3.7  --   --   --   HGB 9.4* 8.6* 7.9* 8.3*  HCT 28.7* 26.6* 23.4* 24.6*  MCV 103.6* 104.3* 102.2* 100.4*  PLT 180 152 156 308    Basic Metabolic Panel: Recent Labs  Lab 02/06/21 1926 02/07/21 0345 02/08/21 0151 02/09/21 0213  NA 140 139 138 139  K 3.4* 3.3* 3.0* 3.4*  CL 102 101 101 100  CO2 30 30 32 28  GLUCOSE 139* 191* 147* 128*  BUN 30* 29* 24* 19  CREATININE 2.13* 1.89* 1.61* 1.42*  CALCIUM 9.0 8.4* 8.4* 8.7*  MG  --   --  1.8 1.7  PHOS  --   --  3.3  --     Liver Function Tests: Recent Labs  Lab 02/06/21 1926  AST 16  ALT 15  ALKPHOS 92  BILITOT 0.8  PROT 6.1*  ALBUMIN 3.6    No results for input(s): LIPASE, AMYLASE in the last 168 hours. No results for input(s): AMMONIA in the last 168 hours. Cardiac Enzymes: No results for input(s): CKTOTAL, CKMB, CKMBINDEX, TROPONINI in the last 168 hours. BNP (last 3 results) No results for input(s): BNP in the last 8760 hours.  ProBNP (last 3 results) No results for input(s): PROBNP in the last 8760 hours.  CBG: Recent Labs  Lab 02/08/21 1622 02/08/21 2015 02/09/21 0628 02/09/21 0859 02/09/21 1120  GLUCAP 121* 120* 145* 134* 127*    Recent Results (from the past 240 hour(s))  Blood culture (routine x 2)     Status: None (Preliminary result)   Collection Time: 02/06/21  9:15 PM   Specimen: BLOOD LEFT HAND  Result Value Ref Range Status   Specimen Description BLOOD LEFT HAND  Final   Special Requests   Final    BOTTLES DRAWN AEROBIC AND ANAEROBIC Blood Culture results may not be optimal due to an inadequate volume of blood received in culture bottles   Culture   Final    NO GROWTH 3 DAYS Performed at [facility], [place] [address], [place], [place] [address]    Report Status PENDING  Incomplete  Blood culture (routine x 2)      Status: None (Preliminary result)   Collection Time: 02/06/21  9:15 PM   Specimen: BLOOD RIGHT HAND  Result Value Ref Range Status   Specimen Description BLOOD RIGHT HAND  Final   Special Requests   Final    BOTTLES DRAWN AEROBIC AND ANAEROBIC Blood Culture results may not be optimal due to an inadequate volume of blood received in culture bottles   Culture   Final    NO GROWTH 3 DAYS Performed at [facility], [place] [address] [place], [place] [address]    Report Status PENDING  Incomplete  Resp Panel by RT-PCR (Flu A&B, Covid) Nasopharyngeal Swab     Status: None   Collection Time: 02/06/21 10:21 PM   Specimen: Nasopharyngeal Swab; Nasopharyngeal(NP) swabs in vial transport medium  Result Value Ref Range Status   SARS Coronavirus 2 by RT PCR NEGATIVE NEGATIVE Final    Comment: (NOTE) SARS-CoV-2 target nucleic acids are NOT DETECTED.  The SARS-CoV-2 RNA is generally detectable in upper respiratory specimens during the acute phase of infection. The lowest concentration of SARS-CoV-2 viral copies this assay can detect is 138 copies/mL. A negative result does not preclude SARS-Cov-2 infection and should not be used as the sole basis for treatment or other patient management decisions. A negative result may occur with  improper specimen collection/handling, submission of specimen other than nasopharyngeal swab, presence of viral mutation(s) within the areas targeted by this assay, and inadequate number of viral copies(<138 copies/mL). A negative result must be combined with clinical observations, patient history, and epidemiological information. The expected result is Negative.  Fact Sheet for Patients:  EntrepreneurPulse.com.au  Fact Sheet for Healthcare Providers:  IncredibleEmployment.be  This test is no t yet approved or cleared by the [place] FDA and  has been authorized for detection and/or diagnosis of SARS-CoV-2 by FDA under  an Emergency Use Authorization (  EUA). This EUA will remain  in effect (meaning this test can be used) for the duration of the COVID-19 declaration under Section 564(b)(1) of the Act, 21 U.S.C.section 360bbb-3(b)(1), unless the authorization is terminated  or revoked sooner.       Influenza A by PCR NEGATIVE NEGATIVE Final   Influenza B by PCR NEGATIVE NEGATIVE Final    Comment: (NOTE) The Xpert Xpress SARS-CoV-2/FLU/RSV plus assay is intended as an aid in the diagnosis of influenza from Nasopharyngeal swab specimens and should not be used as a sole basis for treatment. Nasal washings and aspirates are unacceptable for Xpert Xpress SARS-CoV-2/FLU/RSV testing.  Fact Sheet for Patients: EntrepreneurPulse.com.au  Fact Sheet for Healthcare Providers: IncredibleEmployment.be  This test is not yet approved or cleared by the [place] FDA and has been authorized for detection and/or diagnosis of SARS-CoV-2 by FDA under an Emergency Use Authorization (EUA). This EUA will remain in effect (meaning this test can be used) for the duration of the COVID-19 declaration under Section 564(b)(1) of the Act, 21 U.S.C. section 360bbb-3(b)(1), unless the authorization is terminated or revoked.  Performed at [facility], [place] [address], [place], [place] [address]       Studies: No results found.    Flora Lipps, MD  [facility] 02/09/2021  If 7PM-7AM, please contact night-coverage

## 2021-02-09 NOTE — Care Management Important Message (Signed)
Important Message  Patient Details  Name: Teresa Daniels MRN: [id number] Date of Birth: [birth year]   Medicare Important Message Given:  Yes     Orbie Pyo 02/09/2021, 2:23 PM

## 2021-02-10 ENCOUNTER — Inpatient Hospital Stay (HOSPITAL_COMMUNITY): Payer: Medicare PPO | Admitting: Registered Nurse

## 2021-02-10 ENCOUNTER — Inpatient Hospital Stay (HOSPITAL_COMMUNITY): Payer: Medicare PPO

## 2021-02-10 ENCOUNTER — Encounter (HOSPITAL_COMMUNITY)
Admission: EM | Disposition: A | Discharge status: Skilled Nursing Facility | Payer: Self-pay | Source: Home / Self Care | Attending: Internal Medicine

## 2021-02-10 ENCOUNTER — Encounter (HOSPITAL_COMMUNITY): Payer: Self-pay | Admitting: Family Medicine

## 2021-02-10 DIAGNOSIS — E1142 Type 2 diabetes mellitus with diabetic polyneuropathy: Secondary | ICD-10-CM | POA: Diagnosis not present

## 2021-02-10 DIAGNOSIS — J189 Pneumonia, unspecified organism: Secondary | ICD-10-CM | POA: Diagnosis not present

## 2021-02-10 DIAGNOSIS — N184 Chronic kidney disease, stage 4 (severe): Secondary | ICD-10-CM | POA: Diagnosis not present

## 2021-02-10 DIAGNOSIS — S82891A Other fracture of right lower leg, initial encounter for closed fracture: Secondary | ICD-10-CM | POA: Diagnosis not present

## 2021-02-10 HISTORY — PX: ORIF ANKLE FRACTURE: SHX5408

## 2021-02-10 LAB — CBC
HCT: 27.6 % — ABNORMAL LOW (ref 36.0–46.0)
Hemoglobin: 9.7 g/dL — ABNORMAL LOW (ref 12.0–15.0)
MCH: 34.6 pg — ABNORMAL HIGH (ref 26.0–34.0)
MCHC: 35.1 g/dL (ref 30.0–36.0)
MCV: 98.6 fL (ref 80.0–100.0)
Platelets: 175 10*3/uL (ref 150–400)
RBC: 2.8 MIL/uL — ABNORMAL LOW (ref 3.87–5.11)
RDW: 12.3 % (ref 11.5–15.5)
WBC: 8.9 10*3/uL (ref 4.0–10.5)
nRBC: 0 % (ref 0.0–0.2)

## 2021-02-10 LAB — CREATININE, SERUM
Creatinine, Ser: 1.17 mg/dL — ABNORMAL HIGH (ref 0.44–1.00)
GFR, Estimated: 47 mL/min — ABNORMAL LOW (ref >60)

## 2021-02-10 LAB — GLUCOSE, CAPILLARY
Glucose-Capillary: 116 mg/dL — ABNORMAL HIGH (ref 70–99)
Glucose-Capillary: 141 mg/dL — ABNORMAL HIGH (ref 70–99)
Glucose-Capillary: 141 mg/dL — ABNORMAL HIGH (ref 70–99)
Glucose-Capillary: 115 mg/dL — ABNORMAL HIGH (ref 70–99)
Glucose-Capillary: 144 mg/dL — ABNORMAL HIGH (ref 70–99)

## 2021-02-10 LAB — VITAMIN D 25 HYDROXY (VIT D DEFICIENCY, FRACTURES): Vit D, 25-Hydroxy: 29.28 ng/mL — ABNORMAL LOW (ref 30–100)

## 2021-02-10 SURGERY — OPEN REDUCTION INTERNAL FIXATION (ORIF) ANKLE FRACTURE
Anesthesia: Regional | Site: Ankle | Laterality: Right

## 2021-02-10 MED ORDER — LIDOCAINE 2% (20 MG/ML) 5 ML SYRINGE
INTRAMUSCULAR | Status: DC | PRN
  Administered 2021-02-10: 40 mg via INTRAVENOUS

## 2021-02-10 MED ORDER — CHLORHEXIDINE GLUCONATE 0.12 % MT SOLN: 15.0000 mL | Freq: Once | OROMUCOSAL | Status: AC

## 2021-02-10 MED ORDER — ROPIVACAINE HCL 7.5 MG/ML IJ SOLN
INTRAMUSCULAR | Status: DC | PRN
  Administered 2021-02-10: 15 mL via PERINEURAL

## 2021-02-10 MED ORDER — EPHEDRINE SULFATE-NACL 50-0.9 MG/10ML-% IV SOSY
PREFILLED_SYRINGE | INTRAVENOUS | Status: DC | PRN
  Administered 2021-02-10: 10 mg via INTRAVENOUS
  Administered 2021-02-10: 5 mg via INTRAVENOUS

## 2021-02-10 MED ORDER — POLYETHYLENE GLYCOL 3350 17 G PO PACK: 17.0000 g | PACK | Freq: Every day | ORAL | Status: DC | PRN

## 2021-02-10 MED ORDER — CHLORHEXIDINE GLUCONATE 4 % EX LIQD: 60.0000 mL | Freq: Once | CUTANEOUS | Status: DC

## 2021-02-10 MED ORDER — CEFAZOLIN SODIUM-DEXTROSE 2-4 GM/100ML-% IV SOLN
2.0000 g | INTRAVENOUS | Status: AC
  Administered 2021-02-10: 2 g via INTRAVENOUS

## 2021-02-10 MED ORDER — MIDAZOLAM HCL 2 MG/2ML IJ SOLN: 1.0000 mg | Freq: Once | INTRAMUSCULAR | Status: AC

## 2021-02-10 MED ORDER — FENTANYL CITRATE (PF) 250 MCG/5ML IJ SOLN
INTRAMUSCULAR | Status: DC | PRN
  Administered 2021-02-10: 50 ug via INTRAVENOUS

## 2021-02-10 MED ORDER — LIDOCAINE 2% (20 MG/ML) 5 ML SYRINGE
INTRAMUSCULAR | Status: AC
  Filled 2021-02-10: qty 5

## 2021-02-10 MED ORDER — ONDANSETRON HCL 4 MG/2ML IJ SOLN
INTRAMUSCULAR | Status: AC
  Filled 2021-02-10: qty 2

## 2021-02-10 MED ORDER — ACETAMINOPHEN 10 MG/ML IV SOLN: 1000.0000 mg | Freq: Once | INTRAVENOUS | Status: DC | PRN

## 2021-02-10 MED ORDER — FENTANYL CITRATE (PF) 250 MCG/5ML IJ SOLN
INTRAMUSCULAR | Status: AC
  Filled 2021-02-10: qty 5

## 2021-02-10 MED ORDER — OXYCODONE HCL 5 MG PO TABS
5.0000 mg | ORAL_TABLET | Freq: Once | ORAL | Status: DC | PRN
Start: 2021-02-10 — End: 2021-02-10

## 2021-02-10 MED ORDER — VANCOMYCIN HCL 1000 MG IV SOLR
INTRAVENOUS | Status: DC | PRN
  Administered 2021-02-10: 1000 mg via TOPICAL

## 2021-02-10 MED ORDER — LACTATED RINGERS IV SOLN: INTRAVENOUS | Status: DC

## 2021-02-10 MED ORDER — AMISULPRIDE (ANTIEMETIC) 5 MG/2ML IV SOLN: 10.0000 mg | Freq: Once | INTRAVENOUS | Status: DC | PRN

## 2021-02-10 MED ORDER — MIDAZOLAM HCL 2 MG/2ML IJ SOLN
INTRAMUSCULAR | Status: AC
  Administered 2021-02-10: 1 mg via INTRAVENOUS
  Filled 2021-02-10: qty 2

## 2021-02-10 MED ORDER — ORAL CARE MOUTH RINSE: 15.0000 mL | Freq: Once | OROMUCOSAL | Status: AC

## 2021-02-10 MED ORDER — EPHEDRINE 5 MG/ML INJ
INTRAVENOUS | Status: AC
  Filled 2021-02-10: qty 5

## 2021-02-10 MED ORDER — ACETAMINOPHEN 160 MG/5ML PO SOLN: 325.0000 mg | ORAL | Status: DC | PRN

## 2021-02-10 MED ORDER — OXYCODONE HCL 5 MG/5ML PO SOLN: 5.0000 mg | Freq: Once | ORAL | Status: DC | PRN

## 2021-02-10 MED ORDER — VANCOMYCIN HCL 1000 MG IV SOLR
INTRAVENOUS | Status: AC
  Filled 2021-02-10: qty 1000

## 2021-02-10 MED ORDER — PROMETHAZINE HCL 25 MG/ML IJ SOLN: 6.2500 mg | INTRAMUSCULAR | Status: DC | PRN

## 2021-02-10 MED ORDER — CEFAZOLIN SODIUM 1 G IJ SOLR
INTRAMUSCULAR | Status: AC
  Filled 2021-02-10: qty 20

## 2021-02-10 MED ORDER — ONDANSETRON HCL 4 MG PO TABS: 4.0000 mg | ORAL_TABLET | Freq: Four times a day (QID) | ORAL | Status: DC | PRN

## 2021-02-10 MED ORDER — FENTANYL CITRATE (PF) 100 MCG/2ML IJ SOLN
INTRAMUSCULAR | Status: AC
  Administered 2021-02-10: 25 ug via INTRAVENOUS
  Filled 2021-02-10: qty 2

## 2021-02-10 MED ORDER — BUPIVACAINE-EPINEPHRINE (PF) 0.5% -1:200000 IJ SOLN
INTRAMUSCULAR | Status: DC | PRN
  Administered 2021-02-10: 20 mL

## 2021-02-10 MED ORDER — ACETAMINOPHEN 325 MG PO TABS: 325.0000 mg | ORAL_TABLET | ORAL | Status: DC | PRN

## 2021-02-10 MED ORDER — POVIDONE-IODINE 10 % EX SWAB: 2.0000 "application " | Freq: Once | CUTANEOUS | Status: DC

## 2021-02-10 MED ORDER — DOCUSATE SODIUM 100 MG PO CAPS
100.0000 mg | ORAL_CAPSULE | Freq: Two times a day (BID) | ORAL | Status: DC
  Administered 2021-02-10 – 2021-02-16 (×12): 100 mg via ORAL
  Filled 2021-02-10 (×12): qty 1

## 2021-02-10 MED ORDER — FENTANYL CITRATE (PF) 100 MCG/2ML IJ SOLN: 25.0000 ug | Freq: Once | INTRAMUSCULAR | Status: AC

## 2021-02-10 MED ORDER — METOCLOPRAMIDE HCL 5 MG PO TABS: 5.0000 mg | ORAL_TABLET | Freq: Three times a day (TID) | ORAL | Status: DC | PRN

## 2021-02-10 MED ORDER — CHLORHEXIDINE GLUCONATE 0.12 % MT SOLN
OROMUCOSAL | Status: AC
  Administered 2021-02-10: 15 mL via OROMUCOSAL
  Filled 2021-02-10: qty 15

## 2021-02-10 MED ORDER — FENTANYL CITRATE (PF) 100 MCG/2ML IJ SOLN: 25.0000 ug | INTRAMUSCULAR | Status: DC | PRN

## 2021-02-10 MED ORDER — ONDANSETRON HCL 4 MG/2ML IJ SOLN
INTRAMUSCULAR | Status: DC | PRN
  Administered 2021-02-10: 4 mg via INTRAVENOUS

## 2021-02-10 MED ORDER — PROPOFOL 10 MG/ML IV BOLUS
INTRAVENOUS | Status: DC | PRN
  Administered 2021-02-10: 5 mg via INTRAVENOUS
  Administered 2021-02-10: 150 mg via INTRAVENOUS

## 2021-02-10 MED ORDER — METOCLOPRAMIDE HCL 5 MG/ML IJ SOLN: 5.0000 mg | Freq: Three times a day (TID) | INTRAMUSCULAR | Status: DC | PRN

## 2021-02-10 MED ORDER — 0.9 % SODIUM CHLORIDE (POUR BTL) OPTIME
TOPICAL | Status: DC | PRN
  Administered 2021-02-10: 1000 mL

## 2021-02-10 MED ORDER — ONDANSETRON HCL 4 MG/2ML IJ SOLN: 4.0000 mg | Freq: Four times a day (QID) | INTRAMUSCULAR | Status: DC | PRN

## 2021-02-10 MED ORDER — ENOXAPARIN SODIUM 40 MG/0.4ML IJ SOSY: 40.0000 mg | PREFILLED_SYRINGE | INTRAMUSCULAR | Status: DC

## 2021-02-10 MED ORDER — PROPOFOL 10 MG/ML IV BOLUS
INTRAVENOUS | Status: AC
  Filled 2021-02-10: qty 20

## 2021-02-10 SURGICAL SUPPLY — 66 items
BAG COUNTER SPONGE SURGICOUNT (BAG) ×2 IMPLANT
BANDAGE ESMARK 6X9 LF (GAUZE/BANDAGES/DRESSINGS) ×1 IMPLANT
BIT DRILL QC 2.0 SHORT EVOS SM (DRILL) ×1 IMPLANT
BIT DRILL QC 2.5MM SHRT EVO SM (DRILL) ×1 IMPLANT
BNDG COHESIVE 4X5 TAN STRL (GAUZE/BANDAGES/DRESSINGS) ×2 IMPLANT
BNDG ELASTIC 4X5.8 VLCR STR LF (GAUZE/BANDAGES/DRESSINGS) ×2 IMPLANT
BNDG ELASTIC 6X5.8 VLCR STR LF (GAUZE/BANDAGES/DRESSINGS) IMPLANT
BNDG ESMARK 6X9 LF (GAUZE/BANDAGES/DRESSINGS) ×2
BRUSH SCRUB EZ PLAIN DRY (MISCELLANEOUS) ×4 IMPLANT
CHLORAPREP W/TINT 26 (MISCELLANEOUS) ×2 IMPLANT
COVER SURGICAL LIGHT HANDLE (MISCELLANEOUS) ×2 IMPLANT
DRAPE C-ARM 42X72 X-RAY (DRAPES) ×2 IMPLANT
DRAPE C-ARMOR (DRAPES) ×2 IMPLANT
DRAPE ORTHO SPLIT 77X108 STRL (DRAPES) ×2
DRAPE SURG ORHT 6 SPLT 77X108 (DRAPES) ×2 IMPLANT
DRAPE U-SHAPE 47X51 STRL (DRAPES) ×2 IMPLANT
DRILL QC 2.0 SHORT EVOS SM (DRILL) ×2
DRILL QC 2.5MM SHORT EVOS SM (DRILL) ×2
DRSG ADAPTIC 3X8 NADH LF (GAUZE/BANDAGES/DRESSINGS) IMPLANT
DRSG MEPITEL 4X7.2 (GAUZE/BANDAGES/DRESSINGS) ×2 IMPLANT
DRSG PAD ABDOMINAL 8X10 ST (GAUZE/BANDAGES/DRESSINGS) ×2 IMPLANT
ELECT REM PT RETURN 9FT ADLT (ELECTROSURGICAL) ×2
ELECTRODE REM PT RTRN 9FT ADLT (ELECTROSURGICAL) ×1 IMPLANT
GAUZE SPONGE 4X4 12PLY STRL (GAUZE/BANDAGES/DRESSINGS) ×4 IMPLANT
GLOVE SURG ENC MOIS LTX SZ6.5 (GLOVE) ×6 IMPLANT
GLOVE SURG ENC MOIS LTX SZ7.5 (GLOVE) ×6 IMPLANT
GLOVE SURG UNDER POLY LF SZ6.5 (GLOVE) ×2 IMPLANT
GLOVE SURG UNDER POLY LF SZ7.5 (GLOVE) ×2 IMPLANT
GOWN STRL REUS W/ TWL LRG LVL3 (GOWN DISPOSABLE) ×2 IMPLANT
GOWN STRL REUS W/TWL LRG LVL3 (GOWN DISPOSABLE) ×2
K-WIRE 1.6 (WIRE) ×1
K-WIRE FX150X1.6XTROC PNT (WIRE) ×1
KIT TURNOVER KIT B (KITS) ×2 IMPLANT
KWIRE FX150X1.6XTROC PNT (WIRE) ×1 IMPLANT
MANIFOLD NEPTUNE II (INSTRUMENTS) ×2 IMPLANT
NEEDLE HYPO 21X1.5 SAFETY (NEEDLE) IMPLANT
NEEDLE HYPO 25GX1X1/2 BEV (NEEDLE) IMPLANT
NS IRRIG 1000ML POUR BTL (IV SOLUTION) ×2 IMPLANT
PACK TOTAL JOINT (CUSTOM PROCEDURE TRAY) ×2 IMPLANT
PAD ARMBOARD 7.5X6 YLW CONV (MISCELLANEOUS) ×4 IMPLANT
PAD CAST 4YDX4 CTTN HI CHSV (CAST SUPPLIES) ×1 IMPLANT
PADDING CAST COTTON 4X4 STRL (CAST SUPPLIES) ×1
PADDING CAST COTTON 6X4 STRL (CAST SUPPLIES) IMPLANT
PLATE FIBULA 9H RT (Plate) ×2 IMPLANT
SCREW CORT 2.7X15 T8 ST EVOS (Screw) ×6 IMPLANT
SCREW CORT 2.7X16 STAR T8 EVOS (Screw) ×6 IMPLANT
SCREW CORT 3.5X14 ST EVOS (Screw) ×2 IMPLANT
SCREW CORT EVOS ST 3.5X12 (Screw) ×4 IMPLANT
SCREW CORT ST EVOS 3.5X46 (Screw) ×2 IMPLANT
SCREW CORT ST EVOS 3.5X55 (Screw) ×2 IMPLANT
SCREW CTX 3.5X50MM EVOS (Screw) ×4 IMPLANT
SPONGE T-LAP 18X18 ~~LOC~~+RFID (SPONGE) ×4 IMPLANT
STAPLER VISISTAT 35W (STAPLE) ×2 IMPLANT
SUCTION FRAZIER HANDLE 10FR (MISCELLANEOUS) ×1
SUCTION TUBE FRAZIER 10FR DISP (MISCELLANEOUS) ×1 IMPLANT
SUT ETHILON 3 0 PS 1 (SUTURE) ×4 IMPLANT
SUT PROLENE 0 CT (SUTURE) IMPLANT
SUT VIC AB 0 CT1 27 (SUTURE) ×1
SUT VIC AB 0 CT1 27XBRD ANBCTR (SUTURE) ×1 IMPLANT
SUT VIC AB 2-0 CT1 27 (SUTURE) ×2
SUT VIC AB 2-0 CT1 TAPERPNT 27 (SUTURE) ×2 IMPLANT
SYR CONTROL 10ML LL (SYRINGE) ×2 IMPLANT
TOWEL GREEN STERILE (TOWEL DISPOSABLE) ×4 IMPLANT
TOWEL GREEN STERILE FF (TOWEL DISPOSABLE) ×2 IMPLANT
UNDERPAD 30X36 HEAVY ABSORB (UNDERPADS AND DIAPERS) ×2 IMPLANT
WATER STERILE IRR 1000ML POUR (IV SOLUTION) ×2 IMPLANT

## 2021-02-10 NOTE — Op Note (Signed)
Orthopaedic Surgery Operative Note (CSN: [id number] ) Date of Surgery: 02/10/2021  Admit Date: 02/06/2021   Diagnoses: Pre-Op Diagnoses: Right trimalleolar ankle fracture/dislocation  Post-Op Diagnosis: Same  Procedures: CPT [id number] reduction internal fixation of right trimalleolar ankle fracture CPT 27829-Open repair of right syndesmosis  Surgeons : Primary: Shona Needles, MD  Assistant: Patrecia Pace, PA-C  Location: OR 3   Anesthesia:General with regional anesthesia   Antibiotics: Ancef 2g preop with 1 gm vancomycin powder placed topically   Tourniquet time:None used    Estimated Blood 123456 mL   Complications:None  Specimens:None   Implants: Implant Name Type Inv. Item Serial No. Manufacturer Lot No. LRB No. Used Action  PLATE FIBULA 9H RT - [id number] Plate PLATE FIBULA 9H RT  [employer] ON TRAY Right 1 Implanted  SCREW CORT EVOS ST 3.5X12 - [id number] Screw SCREW CORT EVOS ST 3.5X12  [employer] ON TRAY Right 2 Implanted  SCREW CORT 3.5X14 ST EVOS - [id number] Screw SCREW CORT 3.5X14 ST EVOS  [employer] ON TRAY Right 1 Implanted  SCREW CORT 2.7X16 STAR T8 EVOS - [id number] Screw SCREW CORT 2.7X16 STAR T8 EVOS  [employer] ON TRAY Right 3 Implanted  SCREW CORT 2.7X15 T8 ST EVOS - [id number] Screw SCREW CORT 2.7X15 T8 ST EVOS  [employer] ON TRAY Right 3 Implanted  SCREW CORT ST EVOS 3.5X55 - [id number] Screw SCREW CORT ST EVOS 3.5X55  [employer] ON TRAY Right 1 Implanted  SCREW CTX 3.5X50MM EVOS [id number] Screw SCREW CTX 3.5X50MM EVOS  [employer] ON TRAY Right 2 Implanted  SCREW CORT ST EVOS 3.5X46 - [id number] Screw SCREW CORT ST EVOS 3.5X46  [employer] ON TRAY Right 1 Implanted     Indications for Surgery: 80 year old female who sustained a ground-level fall and a right trimalleolar ankle fracture dislocation.  She was reduced  in the emergency room.  Due to the unstable nature of her injury I recommend proceeding with open reduction internal fixation.  Risks and benefits were discussed with the patient and her daughter.  Risks include but not limited to bleeding, infection, malunion, nonunion, posttraumatic arthritis, failed fixation, nerve or blood vessel injury, DVT, even the possibility anesthetic complications.  The patient and her daughter agreed to proceed with surgery and consent was obtained.  Operative Findings: 1.  Open reduction internal fixation of right trimalleolar ankle fracture using [employer] EVOS 3.35m/2.7mm distal fibular locking plate and independent 3.5 millimeter screws for the medial malleolus 2.  External rotation stress view with medial clear space widening treated with Quadra cortical 3.5 millimeter screws for syndesmotic fixation.  Procedure: The patient was identified in the preoperative holding area. Consent was confirmed with the patient and their family and all questions were answered. The operative extremity was marked after confirmation with the patient. she was then brought back to the operating room by our anesthesia colleagues.  She was placed under general anesthetic and carefully transferred over to a radiolucent flat top table.  A bump was placed under her operative hip.  The right lower extremity was then prepped and draped in usual sterile fashion.  A timeout was performed to verify the patient, the procedure, and the extremity.  Preoperative antibiotics were dosed.  Fluoroscopic imaging was obtained to show the unstable nature of her injury.  A direct lateral approach to the distal fibula was carried down through skin and subcutaneous tissue.  I exposed the fibula fracture and proceeded to reduce the tibiotalar joint and obtained length of the fibula and I reduced the fibular shaft fracture with a reduction tenaculum.  I confirmed adequate reduction with fluoroscopy and then chose to  use a [employer] EVOS distal fibular locking plate and held provisionally with a K wire.  I then drilled and placed on nonlocking screws into the fibular shaft.  A total of 3 screws were placed into the fibular shaft.  The plate was sitting anatomic and flush to the distal fibular head and placed a locking screws into the distal segment.  I kept the clamp in place and proceeded to make a curvilinear incision on the medial side and carried it down through skin and subcutaneous tissue.  I exposed the fracture site and reduced it with a reduction tenaculum.  I held it provisionally with a K wire.  I then drilled and placed 3.5 millimeter screws to fix the medial malleolus.  Once I had the medial side fixed I returned to the lateral side and performed an external rotation stress view.  There was clear medial clear space widening.  As result of the ankle was dorsiflexed I provided a medial translation to the distal fibula and proceeded to drill through the fibula and tibia twice placing two 3.5 mm syndesmotic screws.  Final fluoroscopic imaging was obtained.  An external rotation stress view was performed.  There is no residual medial clear space widening.  The incisions were copiously irrigated.  A gram of vancomycin powder was placed into the incisions.  A layered closure of 2-0 Vicryl and 3-0 nylon was used.  A sterile dressing was applied.  The patient was then placed in the boot.  She was then awoken from anesthesia and taken to the PACU in stable condition.  Post Op Plan/Instructions: The patient be nonweightbearing to the right lower extremity due to her peripheral neuropathy.  She will continue with her pneumonia antibiotics that we will encompass for surgical prophylaxis.  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  We will have her mobilize with physical and Occupational Therapy.  I was present and  performed the entire surgery.  Patrecia Pace, PA-C did assist me throughout the case. An assistant was necessary given the difficulty in approach, maintenance of reduction and ability to instrument the fracture.   Katha Hamming, MD [facility]

## 2021-02-10 NOTE — Progress Notes (Addendum)
PROGRESS NOTE  Teresa Daniels ATF:[id number] DOB: [birth year] DOA: 02/06/2021 PCP: [name], PA-C   LOS: 4 days   Brief narrative: Teresa Daniels is a 80 y.o. female with medical history significant for HTN, DMT2, hx of CVA, CKD 4 presented to hospital with generalized weakness and multiple falls.  Patient had a fall 2 days HAART with this presentation and sustained a small laceration on the back of her head that was seen [facility] and 2 staples were placed.  At that time head CT was negative.  She reports having a chronic cough which is unchanged.  On this admission in the ED, patient was hemodynamically stable.  She was found to have a right ankle fracture and dislocation that was reduced in the emergency room and splint placed.  Orthopedic surgery was consulted.  Patient was found to have a left lower lobe pneumonia on chest x-ray and was started on empiric antibiotics.  Lab work revealed a creatinine of 2.13 which is elevated from her baseline of 1.77.  Sodium was 140, potassium 3.4, chloride 102, bicarb 30, calcium 9, alk phos is 92, AST 16, ALT 15.  WBC 4900, hemoglobin 9.4, hematocrit 28.7, platelets 180,000.  Glucose 139.  Patient was then admitted to the hospital for further evaluation and treatment.    Assessment/Plan:  Principal Problem:   CAP (community acquired pneumonia) Active Problems:   Diabetic polyneuropathy associated with type 2 diabetes mellitus ([place])   History of CVA (cerebrovascular accident)   CKD (chronic kidney disease) stage 4, GFR 15-29 ml/min (HCC)   Generalized weakness   Fracture dislocation of right ankle  Community-acquired pneumonia likely bacterial. Empirically on IV Rocephin and azithromycin.   Clinically improved.  We will continue antibiotic complete 5-day course.  Patient denies any shortness of breath dyspnea.  Off oxygen at this time.  Blood cultures was negative in 4 days.  Diabetic polyneuropathy associated with type 2 diabetes  mellitus Continue sliding scale insulin, Accu-Cheks diabetic diet.  Continue to hold glipizide.  Latest HgbA1c of 6.4.  Latest POC glucose of 141.  Hypokalemia.  Mild.  Replenished.  Check levels in a.m.  Mild acute kidney injury on chronic kidney disease stage 4,  received IV fluid hydration with improvement in creatinine levels.  Latest creatinine of 1.4.  Discontinue IV fluids today.  Generalized weakness, postoperative status Physical therapy recommended skilled nursing facility placement   Right trimalleolar ankle fracture dislocation  with ankle pain. Status post ORIF of the right ankle.  Orthopedics on board.  Follow orthopedic recommendations    History of CVA (cerebrovascular accident) Supportive care.  On Plavix    DVT prophylaxis: heparin injection 5,000 Units Start: 02/07/21 0600   Code Status: Full code  Family Communication:  None today.  Status is: Inpatient  Remains inpatient appropriate because:IV treatments appropriate due to intensity of illness or inability to take PO and Inpatient level of care appropriate due to severity of illness, status post surgical intervention.  Dispo: The patient is from: Home              Anticipated d/c is to: SNF, as per physical therapy.  Family request CIR.              Patient currently is not medically stable to d/c.   Difficult to place patient No  Consultants: Orthopedics CIR  Procedures: ORIF right ankle on 02/10/2021.  Anti-infectives:  Rocephin and Zithromax IV  Anti-infectives (From admission, onward)    Start  Dose/Rate Route Frequency Ordered Stop   02/10/21 0934  vancomycin (VANCOCIN) powder  Status:  Discontinued          As needed 02/10/21 0935 02/10/21 1001   02/10/21 0830  ceFAZolin (ANCEF) IVPB 2g/100 mL premix        2 g 200 mL/hr over 30 Minutes Intravenous On call to O.R. 02/10/21 0822 02/10/21 0930   02/07/21 2200  azithromycin (ZITHROMAX) tablet 500 mg        500 mg Oral Daily at bedtime  02/07/21 1541     02/07/21 0344  cefTRIAXone (ROCEPHIN) 1 g in sodium chloride 0.9 % 100 mL IVPB  Status:  Discontinued        1 g 200 mL/hr over 30 Minutes Intravenous Every 24 hours 02/07/21 0344 02/07/21 0349   02/07/21 0344  azithromycin (ZITHROMAX) 500 mg in sodium chloride 0.9 % 250 mL IVPB  Status:  Discontinued        500 mg 250 mL/hr over 60 Minutes Intravenous Every 24 hours 02/07/21 0344 02/07/21 0348   02/06/21 2100  cefTRIAXone (ROCEPHIN) 1 g in sodium chloride 0.9 % 100 mL IVPB        1 g 200 mL/hr over 30 Minutes Intravenous Every 24 hours 02/06/21 2049     02/06/21 2100  azithromycin (ZITHROMAX) 500 mg in sodium chloride 0.9 % 250 mL IVPB  Status:  Discontinued        500 mg 250 mL/hr over 60 Minutes Intravenous Every 24 hours 02/06/21 2049 02/07/21 1541      Subjective: Physical patient was seen and examined at bedside.  Seen after surgery.  Denies overt pain.  Denies any nausea vomiting shortness of breath cough or fever.  Objective: Vitals:   02/10/21 1208 02/10/21 1410  BP: 136/75 (!) 152/74  Pulse: 73 78  Resp: 18 17  Temp: 97.6 F (36.4 C) 98 F (36.7 C)  SpO2: 96% 98%    Intake/Output Summary (Last 24 hours) at 02/10/2021 1412 Last data filed at 02/10/2021 0931 Gross per 24 hour  Intake 1282.04 ml  Output 50 ml  Net 1232.04 ml    Filed Weights   02/06/21 1927  Weight: 68 kg   Body mass index is 24.96 kg/m.   Physical Exam: General:  Average built, not in obvious distress, alert awake communicative HENT: Mild pallor noted. Chest:  Clear breath sounds.  Diminished breath sounds bilaterally. No crackles or wheezes.  CVS: S1 &S2 heard. No murmur.  Regular rate and rhythm. Abdomen: Soft, nontender, nondistended.  Bowel sounds are heard.   Extremities: No cyanosis, clubbing or edema.  Peripheral pulses are palpable.  Right ankle in a splint status post surgery. Psych: Alert, awake and oriented, normal mood CNS:  No cranial nerve deficits.  Moves all  the extremities. Skin: Warm and dry.    Data Review: I have personally reviewed the following laboratory data and studies,  CBC: Recent Labs  Lab 02/06/21 1926 02/07/21 0345 02/08/21 0151 02/09/21 0213  WBC 4.9 3.9* 5.9 5.0  NEUTROABS 3.7  --   --   --   HGB 9.4* 8.6* 7.9* 8.3*  HCT 28.7* 26.6* 23.4* 24.6*  MCV 103.6* 104.3* 102.2* 100.4*  PLT 180 152 156 161    Basic Metabolic Panel: Recent Labs  Lab 02/06/21 1926 02/07/21 0345 02/08/21 0151 02/09/21 0213  NA 140 139 138 139  K 3.4* 3.3* 3.0* 3.4*  CL 102 101 101 100  CO2 30 30 32 28  GLUCOSE 139*  191* 147* 128*  BUN 30* 29* 24* 19  CREATININE 2.13* 1.89* 1.61* 1.42*  CALCIUM 9.0 8.4* 8.4* 8.7*  MG  --   --  1.8 1.7  PHOS  --   --  3.3  --     Liver Function Tests: Recent Labs  Lab 02/06/21 1926  AST 16  ALT 15  ALKPHOS 92  BILITOT 0.8  PROT 6.1*  ALBUMIN 3.6    No results for input(s): LIPASE, AMYLASE in the last 168 hours. No results for input(s): AMMONIA in the last 168 hours. Cardiac Enzymes: No results for input(s): CKTOTAL, CKMB, CKMBINDEX, TROPONINI in the last 168 hours. BNP (last 3 results) No results for input(s): BNP in the last 8760 hours.  ProBNP (last 3 results) No results for input(s): PROBNP in the last 8760 hours.  CBG: Recent Labs  Lab 02/09/21 1605 02/09/21 2005 02/10/21 0711 02/10/21 1005 02/10/21 1216  GLUCAP 120* 160* 116* 141* 141*    Recent Results (from the past 240 hour(s))  Blood culture (routine x 2)     Status: None (Preliminary result)   Collection Time: 02/06/21  9:15 PM   Specimen: BLOOD LEFT HAND  Result Value Ref Range Status   Specimen Description BLOOD LEFT HAND  Final   Special Requests   Final    BOTTLES DRAWN AEROBIC AND ANAEROBIC Blood Culture results may not be optimal due to an inadequate volume of blood received in culture bottles   Culture   Final    NO GROWTH 4 DAYS Performed at [facility], [place] [address] [place], [place]  [address]    Report Status PENDING  Incomplete  Blood culture (routine x 2)     Status: None (Preliminary result)   Collection Time: 02/06/21  9:15 PM   Specimen: BLOOD RIGHT HAND  Result Value Ref Range Status   Specimen Description BLOOD RIGHT HAND  Final   Special Requests   Final    BOTTLES DRAWN AEROBIC AND ANAEROBIC Blood Culture results may not be optimal due to an inadequate volume of blood received in culture bottles   Culture   Final    NO GROWTH 4 DAYS Performed at [facility], [place] [address], [place], [place] [address]    Report Status PENDING  Incomplete  Resp Panel by RT-PCR (Flu A&B, Covid) Nasopharyngeal Swab     Status: None   Collection Time: 02/06/21 10:21 PM   Specimen: Nasopharyngeal Swab; Nasopharyngeal(NP) swabs in vial transport medium  Result Value Ref Range Status   SARS Coronavirus 2 by RT PCR NEGATIVE NEGATIVE Final    Comment: (NOTE) SARS-CoV-2 target nucleic acids are NOT DETECTED.  The SARS-CoV-2 RNA is generally detectable in upper respiratory specimens during the acute phase of infection. The lowest concentration of SARS-CoV-2 viral copies this assay can detect is 138 copies/mL. A negative result does not preclude SARS-Cov-2 infection and should not be used as the sole basis for treatment or other patient management decisions. A negative result may occur with  improper specimen collection/handling, submission of specimen other than nasopharyngeal swab, presence of viral mutation(s) within the areas targeted by this assay, and inadequate number of viral copies(<138 copies/mL). A negative result must be combined with clinical observations, patient history, and epidemiological information. The expected result is Negative.  Fact Sheet for Patients:  EntrepreneurPulse.com.au  Fact Sheet for Healthcare Providers:  IncredibleEmployment.be  This test is no t yet approved or cleared by the [place] and  has been authorized for detection and/or diagnosis of SARS-CoV-2 by FDA under an Emergency Use Authorization (EUA). This EUA will remain  in effect (meaning this test can be used) for the duration of the COVID-19 declaration under Section 564(b)(1) of the Act, 21 U.S.C.section 360bbb-3(b)(1), unless the authorization is terminated  or revoked sooner.       Influenza A by PCR NEGATIVE NEGATIVE Final   Influenza B by PCR NEGATIVE NEGATIVE Final    Comment: (NOTE) The Xpert Xpress SARS-CoV-2/FLU/RSV plus assay is intended as an aid in the diagnosis of influenza from Nasopharyngeal swab specimens and should not be used as a sole basis for treatment. Nasal washings and aspirates are unacceptable for Xpert Xpress SARS-CoV-2/FLU/RSV testing.  Fact Sheet for Patients: EntrepreneurPulse.com.au  Fact Sheet for Healthcare Providers: IncredibleEmployment.be  This test is not yet approved or cleared by the [place] FDA and has been authorized for detection and/or diagnosis of SARS-CoV-2 by FDA under an Emergency Use Authorization (EUA). This EUA will remain in effect (meaning this test can be used) for the duration of the COVID-19 declaration under Section 564(b)(1) of the Act, 21 U.S.C. section 360bbb-3(b)(1), unless the authorization is terminated or revoked.  Performed at [facility], [place] [address], [place], [place] [address]       Studies: DG Ankle Complete Right  Result Date: 02/10/2021 CLINICAL DATA:  Elective surgery Z41.9 (ICD-10-CM) EXAM: DG C-ARM 1-60 MIN; RIGHT ANKLE - COMPLETE 3+ VIEW FLUOROSCOPY TIME:  Fluoroscopy Time:  42 seconds. Number of Acquired Spot Images: 5 COMPARISON:  02/06/2021. FINDINGS: Five C-arm fluoroscopic images were obtained intraoperatively and submitted for post operative interpretation. These images demonstrate plate and screw fixation of the distal fibular fracture with 2 screws crossing into the  adjacent tibia. Also, two screw fixation of the medial malleolus fracture. Alignment is improved, near anatomic. Redemonstrated posterior malleolus fracture with mild displacement. Please see the performing provider's procedural report for further detail. IMPRESSION: Intraoperative fluoroscopy, as detailed above. Electronically Signed   By: Margaretha Sheffield MD   On: 02/10/2021 12:24   DG Ankle Right Port  Result Date: 02/10/2021 CLINICAL DATA:  Fracture T14.8XXA (ICD-10-CM) Postop. EXAM: PORTABLE RIGHT ANKLE - 2 VIEW COMPARISON:  Same day fluoroscopy.  Radiographs from 02/06/2021. FINDINGS: Plate screw fixation of the distal fibular fracture with 2 screws crossing into the adjacent tibia. Also, 2 screw fixation of the medial malleolus. No evidence of immediate hardware complication. Alignment is improved relative to preoperative radiographs, now near anatomic. Posterior malleolus fracture better characterized on the priors. No evidence of new/interval fracture. Expected postoperative swelling. IMPRESSION: ORIF of the lateral and medial malleolus fractures, as detailed above. Electronically Signed   By: Margaretha Sheffield MD   On: 02/10/2021 12:27   DG C-Arm 1-60 Min  Result Date: 02/10/2021 CLINICAL DATA:  Elective surgery Z41.9 (ICD-10-CM) EXAM: DG C-ARM 1-60 MIN; RIGHT ANKLE - COMPLETE 3+ VIEW FLUOROSCOPY TIME:  Fluoroscopy Time:  42 seconds. Number of Acquired Spot Images: 5 COMPARISON:  02/06/2021. FINDINGS: Five C-arm fluoroscopic images were obtained intraoperatively and submitted for post operative interpretation. These images demonstrate plate and screw fixation of the distal fibular fracture with 2 screws crossing into the adjacent tibia. Also, two screw fixation of the medial malleolus fracture. Alignment is improved, near anatomic. Redemonstrated posterior malleolus fracture with mild displacement. Please see the performing provider's procedural report for further detail. IMPRESSION: Intraoperative  fluoroscopy, as detailed above. Electronically Signed   By: Margaretha Sheffield MD   On:  02/10/2021 12:24      Flora Lipps, MD  [facility] 02/10/2021  If 7PM-7AM, please contact night-coverage

## 2021-02-10 NOTE — Anesthesia Procedure Notes (Signed)
Procedure Name: LMA Insertion Date/Time: 02/10/2021 8:47 AM Performed by: Trinna Post., CRNA Pre-anesthesia Checklist: Patient identified, Emergency Drugs available, Suction available, Patient being monitored and Timeout performed Patient Re-evaluated:Patient Re-evaluated prior to induction Oxygen Delivery Method: Circle system utilized Preoxygenation: Pre-oxygenation with 100% oxygen Induction Type: IV induction Ventilation: Mask ventilation without difficulty LMA: LMA inserted LMA Size: 4.0 Number of attempts: 1 Placement Confirmation: positive ETCO2 Tube secured with: Tape Dental Injury: Teeth and Oropharynx as per pre-operative assessment

## 2021-02-10 NOTE — Anesthesia Procedure Notes (Signed)
Anesthesia Regional Block: Popliteal block   Pre-Anesthetic Checklist: , timeout performed,  Correct Patient, Correct Site, Correct Laterality,  Correct Procedure, Correct Position, site marked,  Risks and benefits discussed,  Surgical consent,  Pre-op evaluation,  At surgeon's request and post-op pain management  Laterality: Right  Prep: chloraprep       Needles:  Injection technique: Single-shot  Needle Type: Echogenic Stimulator Needle     Needle Length: 9cm  Needle Gauge: 21     Additional Needles:   Procedures:,,,, ultrasound used (permanent image in chart),,    Narrative:  Start time: 02/10/2021 8:10 AM End time: 02/10/2021 8:15 AM Injection made incrementally with aspirations every 5 mL.  Performed by: Personally  Anesthesiologist: Effie Berkshire, MD  Additional Notes: Patient tolerated the procedure well. Local anesthetic introduced in an incremental fashion under minimal resistance after negative aspirations. No paresthesias were elicited. After completion of the procedure, no acute issues were identified and patient continued to be monitored by RN.

## 2021-02-10 NOTE — Transfer of Care (Signed)
Immediate Anesthesia Transfer of Care Note  Patient: Teresa Daniels  Procedure(s) Performed: OPEN REDUCTION INTERNAL FIXATION (ORIF) ANKLE FRACTURE (Right: Ankle)  Patient Location: PACU  Anesthesia Type:General and Regional  Level of Consciousness: awake and drowsy  Airway & Oxygen Therapy: Patient Spontanous Breathing and Patient connected to face mask oxygen  Post-op Assessment: Report given to RN and Post -op Vital signs reviewed and stable  Post vital signs: Reviewed and stable  Last Vitals:  Vitals Value Taken Time  BP 130/63 02/10/21 1005  Temp    Pulse 89 02/10/21 1009  Resp 18 02/10/21 1009  SpO2 95 % 02/10/21 1009  Vitals shown include unvalidated device data.  Last Pain:  Vitals:   02/09/21 2330  TempSrc:   PainSc: 0-No pain      Patients Stated Pain Goal: 1 ([phone] [id number])  Complications: No notable events documented.

## 2021-02-10 NOTE — Plan of Care (Signed)

## 2021-02-10 NOTE — Anesthesia Postprocedure Evaluation (Signed)
Anesthesia Post Note  Patient: Tasmin Zahradka  Procedure(s) Performed: OPEN REDUCTION INTERNAL FIXATION (ORIF) ANKLE FRACTURE (Right: Ankle)     Patient location during evaluation: PACU Anesthesia Type: Regional and General Level of consciousness: awake and alert Pain management: pain level controlled Vital Signs Assessment: post-procedure vital signs reviewed and stable Respiratory status: spontaneous breathing, nonlabored ventilation, respiratory function stable and patient connected to nasal cannula oxygen Cardiovascular status: blood pressure returned to baseline and stable Postop Assessment: no apparent nausea or vomiting Anesthetic complications: no   No notable events documented.  Last Vitals:  Vitals:   02/10/21 1135 02/10/21 1208  BP: (!) 152/70 136/75  Pulse: 73 73  Resp: 15 18  Temp: (!) 36.2 C 36.4 C  SpO2: 96% 96%    Last Pain:  Vitals:   02/10/21 1208  TempSrc: Oral  PainSc:                  Effie Berkshire

## 2021-02-10 NOTE — Anesthesia Preprocedure Evaluation (Addendum)
Anesthesia Evaluation  Patient identified by MRN, date of birth, ID band Patient awake    Reviewed: Allergy & Precautions, NPO status , Patient's Chart, lab work & pertinent test results  Airway Mallampati: II  TM Distance: >3 FB Neck ROM: Full    Dental  (+) Poor Dentition, Missing, Loose, Dental Advisory Given, Chipped   Pulmonary asthma , former smoker,     + decreased breath sounds      Cardiovascular hypertension, Pt. on medications  Rhythm:Regular Rate:Normal     Neuro/Psych  Neuromuscular disease CVA negative psych ROS   GI/Hepatic Neg liver ROS, GERD  Medicated,  Endo/Other  diabetes, Type 2, Oral Hypoglycemic Agents  Renal/GU Renal disease     Musculoskeletal negative musculoskeletal ROS (+)   Abdominal Normal abdominal exam  (+)   Peds  Hematology   Anesthesia Other Findings Non-productive cough  Reproductive/Obstetrics                            Anesthesia Physical Anesthesia Plan  ASA: 3  Anesthesia Plan: General   Post-op Pain Management: GA combined w/ Regional for post-op pain   Induction: Intravenous  PONV Risk Score and Plan: 4 or greater and Dexamethasone, Ondansetron and Midazolam  Airway Management Planned: LMA  Additional Equipment: None  Intra-op Plan:   Post-operative Plan: Extubation in OR  Informed Consent: I have reviewed the patients History and Physical, chart, labs and discussed the procedure including the risks, benefits and alternatives for the proposed anesthesia with the patient or authorized representative who has indicated his/her understanding and acceptance.     Dental advisory given  Plan Discussed with: CRNA  Anesthesia Plan Comments:        Anesthesia Quick Evaluation

## 2021-02-10 NOTE — Anesthesia Procedure Notes (Signed)
Anesthesia Regional Block: Adductor canal block   Pre-Anesthetic Checklist: , timeout performed,  Correct Patient, Correct Site, Correct Laterality,  Correct Procedure, Correct Position, site marked,  Risks and benefits discussed,  Surgical consent,  Pre-op evaluation,  At surgeon's request and post-op pain management  Laterality: Right  Prep: chloraprep       Needles:  Injection technique: Single-shot  Needle Type: Echogenic Stimulator Needle     Needle Length: 9cm  Needle Gauge: 21     Additional Needles:   Procedures:,,,, ultrasound used (permanent image in chart),,    Narrative:  Start time: 02/10/2021 8:15 AM End time: 02/10/2021 8:20 AM Injection made incrementally with aspirations every 5 mL.  Performed by: Personally  Anesthesiologist: Effie Berkshire, MD  Additional Notes: Patient tolerated the procedure well. Local anesthetic introduced in an incremental fashion under minimal resistance after negative aspirations. No paresthesias were elicited. After completion of the procedure, no acute issues were identified and patient continued to be monitored by RN.

## 2021-02-10 NOTE — Progress Notes (Signed)
Inpatient Rehab Admissions Coordinator:   CIR consult received and chart reviewed.  Note pt admitted for fall with trimalleolar fracture on the R, now s/p ORIF.  Pre-op therapy recommendations are for SNF.  Note that [facility] will not approve CIR for this patient as she does not have the medical necessity to support an inpatient rehab admit.  We will sign off at this time.  SNF recommendations are appropriate.    Shann Medal, PT, DPT Admissions Coordinator [phone] 02/10/21  4:45 PM

## 2021-02-10 NOTE — Plan of Care (Signed)

## 2021-02-10 NOTE — Consult Note (Signed)
Orthopaedic Trauma Service (OTS) Consult   Patient ID: Teresa Daniels MRN: [id number] DOB/AGE: 1940-10-24 80 y.o.  Reason for Consult:Right ankle fracture Referring Physician: Dr. Malena Catholic, MD Teresa Daniels  HPI: Teresa Daniels is an 80 y.o. female who is being seen in consultation at the request of Dr. Tamera Punt for evaluation of right ankle fracture.  Patient has a history of hypertension, type 2 diabetes, chronic kidney disease who has had multiple falls.  The most recent fall she sustained a right ankle fracture dislocation.  It was reduced in the emergency room.  Due to the patient's continued inpatient status it was recommended for treatment by an orthopedic traumatologist.  Patient was seen and evaluated in preoperative holding area.  Currently comfortable.  No family is at bedside.  Denies any other injuries.  She denies any numbness or tingling.  Patient's breathing has been fine.  Past Medical History:  Diagnosis Date   Asthma    Diabetes mellitus without complication ([place])    Hypertension    Stroke [facility])     Past Surgical History:  Procedure Laterality Date   BREAST LUMPECTOMY Left    CESAREAN SECTION      History reviewed. No pertinent family history.  Social History:  reports that she quit smoking about 53 years ago. Her smoking use included cigarettes. She has never used smokeless tobacco. She reports current alcohol use. She reports that she does not use drugs.  Allergies: No Known Allergies  Medications:  No current facility-administered medications on file prior to encounter.   Current Outpatient Medications on File Prior to Encounter  Medication Sig Dispense Refill   albuterol (VENTOLIN HFA) 108 (90 Base) MCG/ACT inhaler Inhale 2 puffs into the lungs every 6 (six) hours as needed for wheezing or shortness of breath.     atorvastatin (LIPITOR) 40 MG tablet Take 40 mg by mouth at bedtime.     cetirizine (ZYRTEC) 10 MG tablet Take 10 mg by mouth daily.      Cholecalciferol (VITAMIN D-3 PO) Take 1 capsule by mouth daily after supper.     clopidogrel (PLAVIX) 75 MG tablet Take 75 mg by mouth in the morning.     Cyanocobalamin ([employer] B12 QUICK DISSOLVE) 500 MCG LOZG Take 500 mcg by mouth at bedtime.     ferrous sulfate 325 (65 FE) MG tablet Take 325 mg by mouth daily after supper.     Fish Oil-Krill Oil (KRILL & FISH OIL BLEND PO) Take 1 capsule by mouth daily after breakfast.     gabapentin (NEURONTIN) 300 MG capsule Take 300 mg by mouth daily after supper.     glipiZIDE (GLUCOTROL XL) 2.5 MG 24 hr tablet Take 2.5 mg by mouth in the morning.     hydrochlorothiazide (HYDRODIURIL) 25 MG tablet Take 25 mg by mouth daily.      losartan (COZAAR) 50 MG tablet Take 50 mg by mouth daily.     MAGNESIUM PO Take 250 mg by mouth daily.      montelukast (SINGULAIR) 10 MG tablet Take 10 mg by mouth daily after supper.     pantoprazole (PROTONIX) 40 MG tablet Take 40 mg by mouth daily before breakfast.     sertraline (ZOLOFT) 100 MG tablet Take 100 mg by mouth daily after supper.     beclomethasone (QVAR) 40 MCG/ACT inhaler Inhale 1 puff into the lungs 2 (two) times daily.     zinc sulfate 220 (50 Zn) MG capsule Take 1 capsule (220 mg total) by mouth  daily. (Patient not taking: No sig reported)       ROS: Constitutional: No fever or chills Vision: No changes in vision ENT: No difficulty swallowing CV: No chest pain Pulm: No SOB or wheezing GI: No nausea or vomiting GU: No urgency or inability to hold urine Skin: No poor wound healing Neurologic: No numbness or tingling Psychiatric: No depression or anxiety Heme: No bruising Allergic: No reaction to medications or food   Exam: Blood pressure (!) 153/62, pulse 66, temperature 98.4 F (36.9 C), resp. rate 18, height '5\' 5"'$  (1.651 m), weight 68 kg, SpO2 97 %. General: No acute distress Orientation: Awake alert and oriented x3 Mood and Affect: Cooperative and pleasant Gait: Unable to assess due to her  fracture Coordination and balance: Within normal limits.  Right lower extremity: Splint is in place is clean dry and intact.  Compartments are soft compressible.  She has active dorsiflexion plantarflexion of her toes.  Sensation is intact to light touch to her toes.  She has brisk cap refill less than 2 seconds.  I did evaluate the skin which shows some soft tissue swelling.  I did not take the splint fully down to assess skin wrinkling.  No obvious deformity or instability about the knee or hip.  Left lower extremity: Skin without lesions. No tenderness to palpation. Full painless ROM, full strength in each muscle groups without evidence of instability.   Medical Decision Making: Data: Imaging: X-rays were reviewed which shows a trimalleolar ankle fracture dislocation status postreduction with reduced talus.  Labs:  Results for orders placed or performed during the hospital encounter of 02/06/21 (from the past 24 hour(s))  Glucose, capillary     Status: Abnormal   Collection Time: 02/09/21  8:59 AM  Result Value Ref Range   Glucose-Capillary 134 (H) 70 - 99 mg/dL  Glucose, capillary     Status: Abnormal   Collection Time: 02/09/21 11:20 AM  Result Value Ref Range   Glucose-Capillary 127 (H) 70 - 99 mg/dL  Glucose, capillary     Status: Abnormal   Collection Time: 02/09/21  4:05 PM  Result Value Ref Range   Glucose-Capillary 120 (H) 70 - 99 mg/dL  Glucose, capillary     Status: Abnormal   Collection Time: 02/09/21  8:05 PM  Result Value Ref Range   Glucose-Capillary 160 (H) 70 - 99 mg/dL     Imaging or Labs ordered: None  Medical history and chart was reviewed and case discussed with medical provider.  Assessment/Plan: 80 year old female with multiple medical comorbidities with a right ankle trimalleolar fracture dislocation  Due to the unstable nature of her injury I recommend proceeding with open reduction internal fixation.  Risks and benefits were discussed with the  patient.  Risks include but not limited to bleeding, infection, malunion, nonunion, hardware failure, hardware irritation, nerve or blood vessel injury, DVT, even the possibility anesthetic complications.  She agrees to proceed with surgery and consent was obtained.  Shona Needles, MD [facility] [phone] (office) [email or web address]

## 2021-02-10 NOTE — TOC Progression Note (Addendum)
Transition of Care [facility]) - Progression Note    Patient Details  Name: Teresa Daniels MRN: [id number] Date of Birth: 07/11/1940  [facility] CM/SW Contact  Milinda Antis, LCSWA Phone Number: 02/10/2021, 2:20 PM  Clinical Narrative:   14:33-  CSW spoke with the patient's daughter, Teresa Daniels.  CSW inquired about the family's d/c wishes.  The daughter reports that she would like for the patient to go to inpatient rehab and not a SNF because of the possibility that the patient will become depressed and lose motivation if placed in a SNF.  CSW spoke with the daughter about PT/OT needing to assess the patient for appropriateness and insurance authorization.  Attending informed.    Expected Discharge Plan: [place] (vs SNF placement) Barriers to Discharge: Continued Medical Work up  Expected Discharge Plan and Services Expected Discharge Plan: [place] (vs SNF placement)                                               Social Determinants of Health (SDOH) Interventions    Readmission Risk Interventions No flowsheet data found.

## 2021-02-10 NOTE — Progress Notes (Signed)
Patient arrived to short stay soiled with urine.  Patient cleaned and dried,  new linens placed on bed, purwick placed.  Sacral foam placed on patient.

## 2021-02-11 ENCOUNTER — Encounter (HOSPITAL_COMMUNITY): Payer: Self-pay | Admitting: Student

## 2021-02-11 DIAGNOSIS — D649 Anemia, unspecified: Secondary | ICD-10-CM

## 2021-02-11 DIAGNOSIS — J189 Pneumonia, unspecified organism: Secondary | ICD-10-CM | POA: Diagnosis not present

## 2021-02-11 DIAGNOSIS — S82891A Other fracture of right lower leg, initial encounter for closed fracture: Secondary | ICD-10-CM | POA: Diagnosis not present

## 2021-02-11 DIAGNOSIS — N184 Chronic kidney disease, stage 4 (severe): Secondary | ICD-10-CM | POA: Diagnosis not present

## 2021-02-11 DIAGNOSIS — E1142 Type 2 diabetes mellitus with diabetic polyneuropathy: Secondary | ICD-10-CM | POA: Diagnosis not present

## 2021-02-11 LAB — CBC
HCT: 20.9 % — ABNORMAL LOW (ref 36.0–46.0)
Hemoglobin: 7 g/dL — ABNORMAL LOW (ref 12.0–15.0)
MCH: 34.1 pg — ABNORMAL HIGH (ref 26.0–34.0)
MCHC: 33.5 g/dL (ref 30.0–36.0)
MCV: 102 fL — ABNORMAL HIGH (ref 80.0–100.0)
Platelets: 186 10*3/uL (ref 150–400)
RBC: 2.05 MIL/uL — ABNORMAL LOW (ref 3.87–5.11)
RDW: 12.3 % (ref 11.5–15.5)
WBC: 4.8 10*3/uL (ref 4.0–10.5)
nRBC: 0.4 % — ABNORMAL HIGH (ref 0.0–0.2)

## 2021-02-11 LAB — CULTURE, BLOOD (ROUTINE X 2)
Culture: NO GROWTH
Culture: NO GROWTH

## 2021-02-11 LAB — BASIC METABOLIC PANEL
Anion gap: 7 (ref 5–15)
BUN: 15 mg/dL (ref 8–23)
CO2: 27 mmol/L (ref 22–32)
Calcium: 8.2 mg/dL — ABNORMAL LOW (ref 8.9–10.3)
Chloride: 103 mmol/L (ref 98–111)
Creatinine, Ser: 1.32 mg/dL — ABNORMAL HIGH (ref 0.44–1.00)
GFR, Estimated: 41 mL/min — ABNORMAL LOW (ref >60)
Glucose, Bld: 150 mg/dL — ABNORMAL HIGH (ref 70–99)
Potassium: 4.1 mmol/L (ref 3.5–5.1)
Sodium: 137 mmol/L (ref 135–145)

## 2021-02-11 LAB — GLUCOSE, CAPILLARY
Glucose-Capillary: 136 mg/dL — ABNORMAL HIGH (ref 70–99)
Glucose-Capillary: 149 mg/dL — ABNORMAL HIGH (ref 70–99)
Glucose-Capillary: 121 mg/dL — ABNORMAL HIGH (ref 70–99)
Glucose-Capillary: 125 mg/dL — ABNORMAL HIGH (ref 70–99)

## 2021-02-11 LAB — MAGNESIUM: Magnesium: 1.5 mg/dL — ABNORMAL LOW (ref 1.7–2.4)

## 2021-02-11 MED ORDER — APIXABAN 2.5 MG PO TABS: 2.5000 mg | ORAL_TABLET | Freq: Two times a day (BID) | ORAL | 0 refills | Status: DC

## 2021-02-11 MED ORDER — ENOXAPARIN SODIUM 40 MG/0.4ML IJ SOSY: 40.0000 mg | PREFILLED_SYRINGE | INTRAMUSCULAR | Status: DC

## 2021-02-11 MED ORDER — OXYCODONE HCL 5 MG PO TABS: 5.0000 mg | ORAL_TABLET | Freq: Four times a day (QID) | ORAL | 0 refills | Status: DC | PRN

## 2021-02-11 MED ORDER — VITAMIN D 25 MCG (1000 UNIT) PO TABS
1000.0000 [IU] | ORAL_TABLET | Freq: Every day | ORAL | Status: DC
  Administered 2021-02-11 – 2021-02-16 (×6): 1000 [IU] via ORAL
  Filled 2021-02-11 (×6): qty 1

## 2021-02-11 MED ORDER — ACETAMINOPHEN 325 MG PO TABS: 650.0000 mg | ORAL_TABLET | Freq: Four times a day (QID) | ORAL | Status: DC | PRN

## 2021-02-11 NOTE — Progress Notes (Signed)
Orthopaedic Trauma Progress Note  SUBJECTIVE: Doing ok this morning. Reports mild pain about operative site.  Has some neuropathy at baseline so she is unsure of her block is fully worn off.  No chest pain. No SOB. No nausea/vomiting. No other complaints.  Daughter at bedside  OBJECTIVE:  Vitals:   02/10/21 1410 02/10/21 1754  BP: (!) 152/74 (!) 159/69  Pulse: 78 81  Resp: 17 18  Temp: 98 F (36.7 C) 99.1 F (37.3 C)  SpO2: 98% 99%    General: Laying in bed comfortably, no acute distress.  Pleasant and cooperative Respiratory: No increased work of breathing.  Right lower extremity: Cam boot in place.  Dressing clean, dry, intact.  Able to wiggle toes.  Has decreased sensation throughout the foot and ankle, this is baseline per daughter's report.  Sensation intact over the knee and proximal tibia.  Compartment soft and compressible.  Foot warm and well-perfused.  IMAGING: Stable post op imaging.   LABS:  Results for orders placed or performed during the hospital encounter of 02/06/21 (from the past 24 hour(s))  Glucose, capillary     Status: Abnormal   Collection Time: 02/10/21 10:05 AM  Result Value Ref Range   Glucose-Capillary 141 (H) 70 - 99 mg/dL  Glucose, capillary     Status: Abnormal   Collection Time: 02/10/21 12:16 PM  Result Value Ref Range   Glucose-Capillary 141 (H) 70 - 99 mg/dL  VITAMIN D 25 Hydroxy (Vit-D Deficiency, Fractures)     Status: Abnormal   Collection Time: 02/10/21  3:31 PM  Result Value Ref Range   Vit D, 25-Hydroxy 29.28 (L) 30 - 100 ng/mL  Creatinine, serum     Status: Abnormal   Collection Time: 02/10/21  3:31 PM  Result Value Ref Range   Creatinine, Ser 1.17 (H) 0.44 - 1.00 mg/dL   GFR, Estimated 47 (L) >60 mL/min  CBC     Status: Abnormal   Collection Time: 02/10/21  3:31 PM  Result Value Ref Range   WBC 8.9 4.0 - 10.5 K/uL   RBC 2.80 (L) 3.87 - 5.11 MIL/uL   Hemoglobin 9.7 (L) 12.0 - 15.0 g/dL   HCT 27.6 (L) 36.0 - 46.0 %   MCV 98.6  80.0 - 100.0 fL   MCH 34.6 (H) 26.0 - 34.0 pg   MCHC 35.1 30.0 - 36.0 g/dL   RDW 12.3 11.5 - 15.5 %   Platelets 175 150 - 400 K/uL   nRBC 0.0 0.0 - 0.2 %  Glucose, capillary     Status: Abnormal   Collection Time: 02/10/21  4:09 PM  Result Value Ref Range   Glucose-Capillary 115 (H) 70 - 99 mg/dL  Glucose, capillary     Status: Abnormal   Collection Time: 02/10/21  9:13 PM  Result Value Ref Range   Glucose-Capillary 144 (H) 70 - 99 mg/dL  Basic metabolic panel     Status: Abnormal   Collection Time: 02/11/21  2:52 AM  Result Value Ref Range   Sodium 137 135 - 145 mmol/L   Potassium 4.1 3.5 - 5.1 mmol/L   Chloride 103 98 - 111 mmol/L   CO2 27 22 - 32 mmol/L   Glucose, Bld 150 (H) 70 - 99 mg/dL   BUN 15 8 - 23 mg/dL   Creatinine, Ser 1.32 (H) 0.44 - 1.00 mg/dL   Calcium 8.2 (L) 8.9 - 10.3 mg/dL   GFR, Estimated 41 (L) >60 mL/min   Anion gap 7 5 - 15  CBC     Status: Abnormal   Collection Time: 02/11/21  2:52 AM  Result Value Ref Range   WBC 4.8 4.0 - 10.5 K/uL   RBC 2.05 (L) 3.87 - 5.11 MIL/uL   Hemoglobin 7.0 (L) 12.0 - 15.0 g/dL   HCT 20.9 (L) 36.0 - 46.0 %   MCV 102.0 (H) 80.0 - 100.0 fL   MCH 34.1 (H) 26.0 - 34.0 pg   MCHC 33.5 30.0 - 36.0 g/dL   RDW 12.3 11.5 - 15.5 %   Platelets 186 150 - 400 K/uL   nRBC 0.4 (H) 0.0 - 0.2 %  Magnesium     Status: Abnormal   Collection Time: 02/11/21  2:52 AM  Result Value Ref Range   Magnesium 1.5 (L) 1.7 - 2.4 mg/dL  Glucose, capillary     Status: Abnormal   Collection Time: 02/11/21  6:30 AM  Result Value Ref Range   Glucose-Capillary 136 (H) 70 - 99 mg/dL    ASSESSMENT: Teresa Daniels is a 80 y.o. female, 1 Day Post-Op s/p ORIF RIGHT ANKLE FRACTURE  CV/Blood loss: Acute blood loss anemia, Hgb 7.0 this morning.. Hemodynamically stable  PLAN: Weightbearing: NWB RLE ROM: Okay to come out of boot at rest for gentle ankle motion as tolerated Incisional and dressing care: Plan to change dressing to right ankle  tomorrow Showering: Okay to begin showering 02/13/2021 if no drainage from incisions Orthopedic device(s): CAM boot RLE Pain management:  1. Tylenol 650 mg q 6 hours scheduled 2. Oxycodone 5 mg q 6 hours PRN 3.  Dilaudid 0.5 mg q 3 hours PRN VTE prophylaxis: Currently on Plavix.  Hold Lovenox today until hemoglobin stabilizes.  Continue SCDs.  Plan to transition to Eliquis at discharge ID: Ceftriaxone 1 g every 24 hours for CAP Foley/Lines:  No foley, KVO IVFs Impediments to Fracture Healing: Vitamin D level 29, will start on D3 supplementation.  This should be continued at discharge. Dispo: PT/OT evaluation, patient will require SNF.  Okay for discharge from ortho standpoint once cleared by medicine team and therapies.  Have signed and placed discharge Rx for pain medication and Eliquis in patient's chart. Follow - up plan: 2 weeks after discharge for repeat x-rays and wound check  Contact information:  Katha Hamming MD, Patrecia Pace PA-C. After hours and holidays please check [email or web address] for group call information for [facility]   Rashell Shambaugh A. Ricci Barker, PA-C [phone] (office) [email or web address]

## 2021-02-11 NOTE — Plan of Care (Signed)
?  Problem: Clinical Measurements: ?Goal: Ability to maintain clinical measurements within normal limits will improve ?Outcome: Progressing ?Goal: Will remain free from infection ?Outcome: Progressing ?Goal: Diagnostic test results will improve ?Outcome: Progressing ?  ?

## 2021-02-11 NOTE — Evaluation (Signed)
Occupational Therapy Evaluation Patient Details Name: Teresa Daniels MRN: [id number] DOB: April 27, 1941 Today's Date: 02/11/2021    History of Present Illness Pt is a 80 y/o female admitted after a fall in which she sustained a R trimalleolar fracture. Now s/p ORIF on 8/4. PMH including but not limited to  CVA, polyneuropathy, CKD4.   Clinical Impression   Pt presents with decline in function and safety with ADLs and ADL mobility with impaired strength, balance and endurance. PTA pt lives at an ILF, was Ind with ADLs/selfcare used rollater for mobility an has assist for meals and housekeeping. Pt currently requires min guard A for grooming, UB ADLS seated, max - total A with LB ADLs, total A with toileting and max A with sit - stand/SPTs. Pt would benefit from acute OT services to address impairments to maximize level of function and safety    Follow Up Recommendations  SNF    Equipment Recommendations  Other (comment) (TBD at SNF)    Recommendations for Other Services       Precautions / Restrictions Precautions Precautions: Fall Required Braces or Orthoses: Other Brace Other Brace: CAM boot on R LE Restrictions Weight Bearing Restrictions: Yes RLE Weight Bearing: Non weight bearing      Mobility Bed Mobility Overal bed mobility: Needs Assistance Bed Mobility: Supine to Sit     Supine to sit: Mod assist     General bed mobility comments: mod A to elevate trunk, min A with LE mgt    Transfers Overall transfer level: Needs assistance Equipment used: Rolling walker (2 wheeled) Transfers: Sit to/from Stand Sit to Stand: Max assist Stand pivot transfers: Max assist       General transfer comment: pt unable to maintain NWB and OT placing ing her foot underneath pt's R LE to assist with maintaining NWB R LE throughout    Balance Overall balance assessment: Needs assistance Sitting-balance support: Feet supported Sitting balance-Leahy Scale: Fair     Standing balance  support: Bilateral upper extremity supported;During functional activity Standing balance-Leahy Scale: Poor                             ADL either performed or assessed with clinical judgement   ADL Overall ADL's : Needs assistance/impaired Eating/Feeding: Set up;Independent;Sitting   Grooming: Wash/dry hands;Wash/dry face;Min guard;Sitting   Upper Body Bathing: Min guard;Sitting   Lower Body Bathing: Maximal assistance   Upper Body Dressing : Min guard;Sitting   Lower Body Dressing: Total assistance   Toilet Transfer: Maximal assistance;RW;Stand-pivot;BSC;Cueing for safety;Cueing for sequencing   Toileting- Clothing Manipulation and Hygiene: Total assistance       Functional mobility during ADLs: Maximal assistance;Cueing for sequencing;Cueing for safety;Rolling walker General ADL Comments: pt unable to maintina NWB     Vision Baseline Vision/History: Wears glasses Patient Visual Report: No change from baseline       Perception     Praxis      Pertinent Vitals/Pain Pain Assessment: Faces Faces Pain Scale: Hurts even more Pain Location: R ankle/foot Pain Descriptors / Indicators: Guarding;Grimacing Pain Intervention(s): Limited activity within patient's tolerance;Monitored during session;Premedicated before session;Repositioned     Hand Dominance Right   Extremity/Trunk Assessment Upper Extremity Assessment Upper Extremity Assessment: Generalized weakness   Lower Extremity Assessment Lower Extremity Assessment: Defer to PT evaluation   Cervical / Trunk Assessment Cervical / Trunk Assessment: Kyphotic   Communication Communication Communication: No difficulties   Cognition Arousal/Alertness: Awake/alert Behavior During Therapy: [facility] for  tasks assessed/performed Overall Cognitive Status: Impaired/Different from baseline Area of Impairment: Problem solving                             Problem Solving: Difficulty sequencing;Requires  verbal cues;Requires tactile cues General Comments: following instructions, difficulty sequencing without cues   General Comments       Exercises     Shoulder Instructions      Home Living Family/patient expects to be discharged to:: Other (Comment) (ILF) Living Arrangements: Alone Available Help at Discharge: Family;Friend(s);Available PRN/intermittently Type of Home: Independent living facility Home Access: Level entry     Home Layout: One level         Bathroom Toilet: Standard     Home Equipment: Walker - 2 wheels;Shower seat;Grab bars - toilet;Grab bars - tub/shower   Additional Comments: lives at [facility]      Prior Functioning/Environment Level of Independence: Independent with assistive device(s);Needs assistance  Gait / Transfers Assistance Needed: RW with independence ADL's / Homemaking Assistance Needed: Ind with ADLs/selfcare, assist with meals and home mgt/housekeeping   Comments: [place] support in [place]        OT Problem List: Decreased strength;Impaired balance (sitting and/or standing);Decreased cognition;Decreased knowledge of precautions;Pain;Decreased safety awareness;Decreased activity tolerance;Decreased coordination;Decreased knowledge of use of DME or AE      OT Treatment/Interventions: Self-care/ADL training;Therapeutic exercise;Patient/family education;Balance training;Therapeutic activities;DME and/or AE instruction    OT Goals(Current goals can be found in the care plan section) Acute Rehab OT Goals Patient Stated Goal: go home OT Goal Formulation: With patient/family Time For Goal Achievement: 02/25/21 Potential to Achieve Goals: Good ADL Goals Pt Will Perform Grooming: with supervision;with set-up;sitting Pt Will Perform Upper Body Bathing: with supervision;with set-up;sitting Pt Will Perform Lower Body Bathing: with mod assist;sitting/lateral leans Pt Will Perform Upper Body Dressing: with supervision;with  set-up;sitting Pt Will Transfer to Toilet: with mod assist;stand pivot transfer;bedside commode  OT Frequency: Min 2X/week   Barriers to D/C:            Co-evaluation              AM-PAC OT "6 Clicks" Daily Activity     Outcome Measure Help from another person eating meals?: None Help from another person taking care of personal grooming?: A Little Help from another person toileting, which includes using toliet, bedpan, or urinal?: Total Help from another person bathing (including washing, rinsing, drying)?: A Lot Help from another person to put on and taking off regular upper body clothing?: A Little Help from another person to put on and taking off regular lower body clothing?: Total 6 Click Score: 14   End of Session Equipment Utilized During Treatment: Gait belt;Rolling walker;Other (comment) [facility]) Nurse Communication: Mobility status  Activity Tolerance: Patient limited by fatigue Patient left: in chair;with call bell/phone within reach;with chair alarm set  OT Visit Diagnosis: Unsteadiness on feet (R26.81);Other abnormalities of gait and mobility (R26.89);History of falling (Z91.81);Muscle weakness (generalized) (M62.81);Other symptoms and signs involving cognitive function;Pain Pain - Right/Left: Right Pain - part of body: Ankle and joints of foot;Leg                Time: [id number] OT Time Calculation (min): 31 min Charges:  OT General Charges $OT Visit: 1 Visit OT Evaluation $OT Eval Moderate Complexity: 1 Mod OT Treatments $Self Care/Home Management : 8-22 mins    Britt Bottom 02/11/2021, 2:18 PM

## 2021-02-11 NOTE — Progress Notes (Signed)
  Speech Language Pathology Treatment: Dysphagia  Patient Details Name: Teresa Daniels MRN: [id number] DOB: May 21, 1941 Today's Date: 02/11/2021 Time: [id number] SLP Time Calculation (min) (ACUTE ONLY): 12 min  Assessment / Plan / Recommendation Clinical Impression  Pt reports doing well consuming current diet since last treatment session. When provided cup of thin liquids, pt required min verbal cues for small sips, exhibiting x2 overt coughing across x6 trials, both instances pt reports getting a piece of ice with her sip which made her laugh while swallowing. Post incidence, cues for small sips resulted in no overt s/sx of aspiration. Regular textured solid unremarkable and cleared orally. Educated pt on importance of straw restriction and small sips in reducing risk for aspiration as well as BID oral care. While pt is still at a mild risk for aspiration of thin liquids, adherence to these strategies and precautions can help reduce risk for recurrent PNA while also providing pt  quality of life and hydration. Recommend pt continue on dysphagia 3, thin liquid diet (no straws) with supervision from staff for strategies. All eduction has been completed, no further ST f/u warranted at this time. Will s/o.    HPI HPI: Pt is a 80 y.o. female who presented for evaluation of generalized weakness and has had multiple falls at home over the last 24 hours.  Pt reports having a chronic cough which is unchanged. Chest xray (02/06/21) revealed "Minimal consolidative opacity left lung base may represent atelectasis or infection". Per RN note (02/07/21), "Pt noted to be coughing upon swallowing meds. Pt verbalized having difficulty taking pills at home". SLP consulted at that time. MBS (02/07/21) revealed aspiration of thin liquids via straw. Dys 3/thin (no straw) recommended. PMH: HTN, DMT2, hx of CVA, CKD 4, EGD (08/03/20- [facility]).      SLP Plan  Discharge SLP treatment due to (comment)       Recommendations  Diet  recommendations: Dysphagia 3 (mechanical soft);Thin liquid Liquids provided via: No straw;Cup Medication Administration: Crushed with puree Supervision: Full supervision/cueing for compensatory strategies;Patient able to self feed Compensations: Minimize environmental distractions;Slow rate;Small sips/bites;Clear throat intermittently Postural Changes and/or Swallow Maneuvers: Seated upright 90 degrees                Oral Care Recommendations: Oral care BID;Staff/trained caregiver to provide oral care Follow up Recommendations: 24 hour supervision/assistance;Skilled Nursing facility SLP Visit Diagnosis: Dysphagia, oropharyngeal phase (R13.12) Plan: Discharge SLP treatment due to (comment)       [place], [place], [place] Office Number: [phone]  Acie Fredrickson 02/11/2021, 3:04 PM

## 2021-02-11 NOTE — Progress Notes (Signed)
Physical Therapy Treatment Patient Details Name: Teresa Daniels MRN: [id number] DOB: 1940-10-06 Today's Date: 02/11/2021    History of Present Illness Pt is a 80 y/o female admitted after a fall in which she sustained a R trimalleolar fracture. Now s/p ORIF on 8/4. PMH including but not limited to  CVA, polyneuropathy, CKD4.    PT Comments    Pt making slow progress with mobility and continues to require mod A x2 for sit<>stand transfers. Assisted pt to [facility] this session where she sat for an extended time but was unable to void despite having the sensation that she needed to. Pt remained up in recliner chair at end of session. Pt would continue to benefit from skilled physical therapy services at this time while admitted and after d/c to address the below listed limitations in order to improve overall safety and independence with functional mobility.    Follow Up Recommendations  SNF;Supervision/Assistance - 24 hour     Equipment Recommendations  None recommended by PT    Recommendations for Other Services       Precautions / Restrictions Precautions Precautions: Fall Required Braces or Orthoses: Other Brace Other Brace: CAM boot on R LE Restrictions Weight Bearing Restrictions: Yes RLE Weight Bearing: Non weight bearing    Mobility  Bed Mobility               General bed mobility comments: pt OOB in recliner chair upon PT arrival    Transfers Overall transfer level: Needs assistance Equipment used: Rolling walker (2 wheeled) Transfers: Sit to/from Stand Sit to Stand: +2 physical assistance;Mod assist         General transfer comment: pt performed sit<>stand transfer x1 from recliner chair and x1 from [facility]. she required mod A x2 and verbal cueing for technique with both. PT also placing her foot underneath pt's R LE to assist with maintaining NWB R LE throughout  Ambulation/Gait             General Gait Details: Unable   Stairs              Wheelchair Mobility    Modified Rankin (Stroke Patients Only)       Balance Overall balance assessment: Needs assistance Sitting-balance support: Feet supported Sitting balance-Leahy Scale: Fair     Standing balance support: Bilateral upper extremity supported;During functional activity Standing balance-Leahy Scale: Poor                              Cognition Arousal/Alertness: Awake/alert Behavior During Therapy: WFL for tasks assessed/performed Overall Cognitive Status: Impaired/Different from baseline Area of Impairment: Problem solving                             Problem Solving: Difficulty sequencing;Requires verbal cues;Requires tactile cues        Exercises      General Comments        Pertinent Vitals/Pain Pain Assessment: Faces Faces Pain Scale: Hurts even more Pain Location: R ankle/foot Pain Descriptors / Indicators: Guarding;Grimacing Pain Intervention(s): Monitored during session;Repositioned    Home Living                      Prior Function            PT Goals (current goals can now be found in the care plan section) Acute Rehab PT Goals PT Goal Formulation: With  patient Time For Goal Achievement: 02/22/21 Potential to Achieve Goals: Fair Progress towards PT goals: Progressing toward goals    Frequency    Min 3X/week      PT Plan Current plan remains appropriate    Co-evaluation              AM-PAC PT "6 Clicks" Mobility   Outcome Measure  Help needed turning from your back to your side while in a flat bed without using bedrails?: A Little Help needed moving from lying on your back to sitting on the side of a flat bed without using bedrails?: A Little Help needed moving to and from a bed to a chair (including a wheelchair)?: A Lot Help needed standing up from a chair using your arms (e.g., wheelchair or bedside chair)?: A Lot Help needed to walk in hospital room?: Total Help needed  climbing 3-5 steps with a railing? : Total 6 Click Score: 12    End of Session Equipment Utilized During Treatment: Gait belt Activity Tolerance: Patient limited by fatigue Patient left: in chair;with call bell/phone within reach;with chair alarm set Nurse Communication: Mobility status;Other (comment) (informed NT of pt's food tray being present and that based on SLP's posted restrictions pt must have full supervision to eat) PT Visit Diagnosis: Muscle weakness (generalized) (M62.81);History of falling (Z91.81);Pain;Unsteadiness on feet (R26.81);Difficulty in walking, not elsewhere classified (R26.2) Pain - Right/Left: Right Pain - part of body: Ankle and joints of foot     Time: [id number] PT Time Calculation (min) (ACUTE ONLY): 23 min  Charges:  $Therapeutic Activity: 23-37 mins                     Anastasio Champion, DPT  Acute Rehabilitation Services Office [place] 02/11/2021, 12:16 PM

## 2021-02-11 NOTE — Progress Notes (Addendum)
PROGRESS NOTE  Teresa Daniels [id number] DOB: Apr 21, 1941 DOA: 02/06/2021 PCP: [name], PA-C   LOS: 5 days   Brief narrative:  Teresa Daniels is a 80 y.o. female with medical history significant for hypertension, diabetes mellitus type 2, history of CVA, CKD stage IV presented to the hospital with generalized weakness and multiple falls.    Patient had a fall 2 days prior to this presentation and sustained a small laceration on the back of her head.  At that time head CT was negative.  She reports having a chronic cough which is unchanged.  On this admission, in the ED, patient was hemodynamically stable.  She was found to have a right ankle fracture and dislocation that was reduced in the emergency room and splint placed.  Orthopedic surgery was consulted.  Patient was found to have a left lower lobe pneumonia on chest x-ray and was started on empiric antibiotics.  Lab work revealed a creatinine of 2.13 which is elevated from her baseline of 1.77.  Patient was then admitted to the hospital for further evaluation and treatment.   During hospitalization, patient was given IV antibiotics with stabilization of her pneumonia.  She was largely asymptomatic from this.  She underwent ORIF of the right ankle by orthopedic surgery and is currently awaiting for skilled nursing facility placement.   Assessment/Plan:  Principal Problem:   CAP (community acquired pneumonia) Active Problems:   Diabetic polyneuropathy associated with type 2 diabetes mellitus ([place])   History of CVA (cerebrovascular accident)   CKD (chronic kidney disease) stage 4, GFR 15-29 ml/min (HCC)   Generalized weakness   Fracture dislocation of right ankle  Community-acquired pneumonia likely bacterial. Empirically on IV Rocephin and azithromycin.   Clinically improved.  We will continue antibiotic to complete 5-day course.  Has mild cough which is unchanged.  Blood cultures remain negative.  Anemia, could be multifactorial from  CKD and postoperative blood loss.   Hold Lovenox today.  We will monitor hemoglobin.  Hemoglobin of 7.0 today.  If hemoglobin less than 7 might need to be transfused.  No external bleeding noted.  Restart the Lovenox if hemoglobin stable.  Already on Plavix.  Diabetic polyneuropathy associated with type 2 diabetes mellitus Adequate control.  Continue sliding scale insulin, Accu-Cheks diabetic diet.  Continue to hold glipizide.  Latest HgbA1c of 6.4.  Latest POC glucose of 136.  Hypokalemia.  Was replenished.  Potassium today at 4.1.  Mild acute kidney injury on chronic kidney disease stage 4,  received IV fluid hydration initially with improvement in creatinine levels.  Creatinine of 1.3 today.  Off IV fluids   Generalized weakness, postoperative status Physical therapy recommended skilled nursing facility placement.  VIR has denied the patient at this time.   Right trimalleolar ankle fracture dislocation  with ankle pain. Status post ORIF of the right ankle.  Orthopedics on board.   Orthopedic recommended nonweightbearing to the right lower extremity.  Okay to shower beginning 02/13/2021.  Will need to follow-up with orthopedics in 2 weeks after discharge.    History of CVA (cerebrovascular accident) Supportive care.  On Plavix    DVT prophylaxis: enoxaparin (LOVENOX) injection 40 mg Start: 02/12/21 1000 SCDs Start: 02/10/21 1419.  Holding Lovenox today until hemoglobin stabilizes.  Plan to transition to Eliquis on discharge as per orthopedics.  Code Status: Full code  Family Communication:  Spoke with the patient's daughter at bedside.  Status is: Inpatient  Remains inpatient appropriate because:IV treatments appropriate due to intensity of illness  or inability to take PO and Inpatient level of care appropriate due to severity of illness, status post surgical intervention, awaiting for rehabilitation placement.  Dispo: The patient is from: Home              Anticipated d/c is to: SNF,  as per physical therapy.                Patient currently is not medically stable to d/c.  Will trend hemoglobin levels.   Difficult to place patient No  Consultants: Orthopedics CIR  Procedures: ORIF right ankle on 02/10/2021.  Anti-infectives:  Rocephin and Zithromax IV  Anti-infectives (From admission, onward)    Start     Dose/Rate Route Frequency Ordered Stop   02/10/21 0934  vancomycin (VANCOCIN) powder  Status:  Discontinued          As needed 02/10/21 0935 02/10/21 1001   02/10/21 0830  ceFAZolin (ANCEF) IVPB 2g/100 mL premix        2 g 200 mL/hr over 30 Minutes Intravenous On call to O.R. 02/10/21 0822 02/10/21 0930   02/07/21 2200  azithromycin (ZITHROMAX) tablet 500 mg        500 mg Oral Daily at bedtime 02/07/21 1541     02/07/21 0344  cefTRIAXone (ROCEPHIN) 1 g in sodium chloride 0.9 % 100 mL IVPB  Status:  Discontinued        1 g 200 mL/hr over 30 Minutes Intravenous Every 24 hours 02/07/21 0344 02/07/21 0349   02/07/21 0344  azithromycin (ZITHROMAX) 500 mg in sodium chloride 0.9 % 250 mL IVPB  Status:  Discontinued        500 mg 250 mL/hr over 60 Minutes Intravenous Every 24 hours 02/07/21 0344 02/07/21 0348   02/06/21 2100  cefTRIAXone (ROCEPHIN) 1 g in sodium chloride 0.9 % 100 mL IVPB        1 g 200 mL/hr over 30 Minutes Intravenous Every 24 hours 02/06/21 2049     02/06/21 2100  azithromycin (ZITHROMAX) 500 mg in sodium chloride 0.9 % 250 mL IVPB  Status:  Discontinued        500 mg 250 mL/hr over 60 Minutes Intravenous Every 24 hours 02/06/21 2049 02/07/21 1541      Subjective: Physical patient was seen and examined at bedside.  Seen after surgery.  Denies overt pain.  Denies any nausea vomiting shortness of breath cough or fever.  Objective: Vitals:   02/10/21 1754 02/11/21 0753  BP: (!) 159/69 (!) 130/57  Pulse: 81 83  Resp: 18 18  Temp: 99.1 F (37.3 C) 98.5 F (36.9 C)  SpO2: 99% 99%   No intake or output data in the 24 hours ending 02/11/21  1016  Filed Weights   02/06/21 1927  Weight: 68 kg   Body mass index is 24.96 kg/m.   Physical Exam: General:  Average built, not in obvious distress HENT: Mild pallor noted.  Oral mucosa is moist.  Chest:  Clear breath sounds.  Diminished breath sounds bilaterally. No crackles or wheezes.  CVS: S1 &S2 heard. No murmur.  Regular rate and rhythm. Abdomen: Soft, nontender, nondistended.  Bowel sounds are heard.   Extremities: Right ankle in a cam boot.  Able to wiggle toes.  Capillary refill present. Psych: Alert, awake and oriented, normal mood CNS:  No cranial nerve deficits.  Power equal in all extremities.   Skin: Warm and dry.  No rashes noted.   Data Review: I have personally reviewed the following laboratory  data and studies,  CBC: Recent Labs  Lab 02/06/21 1926 02/07/21 0345 02/08/21 0151 02/09/21 0213 02/10/21 1531 02/11/21 0252  WBC 4.9 3.9* 5.9 5.0 8.9 4.8  NEUTROABS 3.7  --   --   --   --   --   HGB 9.4* 8.6* 7.9* 8.3* 9.7* 7.0*  HCT 28.7* 26.6* 23.4* 24.6* 27.6* 20.9*  MCV 103.6* 104.3* 102.2* 100.4* 98.6 102.0*  PLT 180 152 156 153 175 [id number]    Basic Metabolic Panel: Recent Labs  Lab 02/06/21 1926 02/07/21 0345 02/08/21 0151 02/09/21 0213 02/10/21 1531 02/11/21 0252  NA 140 139 138 139  --  137  K 3.4* 3.3* 3.0* 3.4*  --  4.1  CL 102 101 101 100  --  103  CO2 30 30 32 28  --  27  GLUCOSE 139* 191* 147* 128*  --  150*  BUN 30* 29* 24* 19  --  15  CREATININE 2.13* 1.89* 1.61* 1.42* 1.17* 1.32*  CALCIUM 9.0 8.4* 8.4* 8.7*  --  8.2*  MG  --   --  1.8 1.7  --  1.5*  PHOS  --   --  3.3  --   --   --     Liver Function Tests: Recent Labs  Lab 02/06/21 1926  AST 16  ALT 15  ALKPHOS 92  BILITOT 0.8  PROT 6.1*  ALBUMIN 3.6    No results for input(s): LIPASE, AMYLASE in the last 168 hours. No results for input(s): AMMONIA in the last 168 hours. Cardiac Enzymes: No results for input(s): CKTOTAL, CKMB, CKMBINDEX, TROPONINI in the last 168  hours. BNP (last 3 results) No results for input(s): BNP in the last 8760 hours.  ProBNP (last 3 results) No results for input(s): PROBNP in the last 8760 hours.  CBG: Recent Labs  Lab 02/10/21 1005 02/10/21 1216 02/10/21 1609 02/10/21 2113 02/11/21 0630  GLUCAP 141* 141* 115* 144* 136*    Recent Results (from the past 240 hour(s))  Blood culture (routine x 2)     Status: None   Collection Time: 02/06/21  9:15 PM   Specimen: BLOOD LEFT HAND  Result Value Ref Range Status   Specimen Description BLOOD LEFT HAND  Final   Special Requests   Final    BOTTLES DRAWN AEROBIC AND ANAEROBIC Blood Culture results may not be optimal due to an inadequate volume of blood received in culture bottles   Culture   Final    NO GROWTH 5 DAYS Performed at [facility], [place] [address] [place], [place] [address]    Report Status 02/11/2021 FINAL  Final  Blood culture (routine x 2)     Status: None   Collection Time: 02/06/21  9:15 PM   Specimen: BLOOD RIGHT HAND  Result Value Ref Range Status   Specimen Description BLOOD RIGHT HAND  Final   Special Requests   Final    BOTTLES DRAWN AEROBIC AND ANAEROBIC Blood Culture results may not be optimal due to an inadequate volume of blood received in culture bottles   Culture   Final    NO GROWTH 5 DAYS Performed at [facility], [place] [address] [place], [place] [address]    Report Status 02/11/2021 FINAL  Final  Resp Panel by RT-PCR (Flu A&B, Covid) Nasopharyngeal Swab     Status: None   Collection Time: 02/06/21 10:21 PM   Specimen: Nasopharyngeal Swab; Nasopharyngeal(NP) swabs in vial transport medium  Result Value Ref Range  Status   SARS Coronavirus 2 by RT PCR NEGATIVE NEGATIVE Final    Comment: (NOTE) SARS-CoV-2 target nucleic acids are NOT DETECTED.  The SARS-CoV-2 RNA is generally detectable in upper respiratory specimens during the acute phase of infection. The lowest concentration of SARS-CoV-2 viral copies this assay can  detect is 138 copies/mL. A negative result does not preclude SARS-Cov-2 infection and should not be used as the sole basis for treatment or other patient management decisions. A negative result may occur with  improper specimen collection/handling, submission of specimen other than nasopharyngeal swab, presence of viral mutation(s) within the areas targeted by this assay, and inadequate number of viral copies(<138 copies/mL). A negative result must be combined with clinical observations, patient history, and epidemiological information. The expected result is Negative.  Fact Sheet for Patients:  EntrepreneurPulse.com.au  Fact Sheet for Healthcare Providers:  IncredibleEmployment.be  This test is no t yet approved or cleared by the [place] FDA and  has been authorized for detection and/or diagnosis of SARS-CoV-2 by FDA under an Emergency Use Authorization (EUA). This EUA will remain  in effect (meaning this test can be used) for the duration of the COVID-19 declaration under Section 564(b)(1) of the Act, 21 U.S.C.section 360bbb-3(b)(1), unless the authorization is terminated  or revoked sooner.       Influenza A by PCR NEGATIVE NEGATIVE Final   Influenza B by PCR NEGATIVE NEGATIVE Final    Comment: (NOTE) The Xpert Xpress SARS-CoV-2/FLU/RSV plus assay is intended as an aid in the diagnosis of influenza from Nasopharyngeal swab specimens and should not be used as a sole basis for treatment. Nasal washings and aspirates are unacceptable for Xpert Xpress SARS-CoV-2/FLU/RSV testing.  Fact Sheet for Patients: EntrepreneurPulse.com.au  Fact Sheet for Healthcare Providers: IncredibleEmployment.be  This test is not yet approved or cleared by the [place] FDA and has been authorized for detection and/or diagnosis of SARS-CoV-2 by FDA under an Emergency Use Authorization (EUA). This EUA will remain in  effect (meaning this test can be used) for the duration of the COVID-19 declaration under Section 564(b)(1) of the Act, 21 U.S.C. section 360bbb-3(b)(1), unless the authorization is terminated or revoked.  Performed at [facility], [place] [address], [place], [place] [address]       Studies: DG Ankle Complete Right  Result Date: 02/10/2021 CLINICAL DATA:  Elective surgery Z41.9 (ICD-10-CM) EXAM: DG C-ARM 1-60 MIN; RIGHT ANKLE - COMPLETE 3+ VIEW FLUOROSCOPY TIME:  Fluoroscopy Time:  42 seconds. Number of Acquired Spot Images: 5 COMPARISON:  02/06/2021. FINDINGS: Five C-arm fluoroscopic images were obtained intraoperatively and submitted for post operative interpretation. These images demonstrate plate and screw fixation of the distal fibular fracture with 2 screws crossing into the adjacent tibia. Also, two screw fixation of the medial malleolus fracture. Alignment is improved, near anatomic. Redemonstrated posterior malleolus fracture with mild displacement. Please see the performing provider's procedural report for further detail. IMPRESSION: Intraoperative fluoroscopy, as detailed above. Electronically Signed   By: Margaretha Sheffield MD   On: 02/10/2021 12:24   DG Ankle Right Port  Result Date: 02/10/2021 CLINICAL DATA:  Fracture T14.8XXA (ICD-10-CM) Postop. EXAM: PORTABLE RIGHT ANKLE - 2 VIEW COMPARISON:  Same day fluoroscopy.  Radiographs from 02/06/2021. FINDINGS: Plate screw fixation of the distal fibular fracture with 2 screws crossing into the adjacent tibia. Also, 2 screw fixation of the medial malleolus. No evidence of immediate hardware complication. Alignment is improved relative to preoperative radiographs, now near anatomic. Posterior malleolus fracture better characterized  on the priors. No evidence of new/interval fracture. Expected postoperative swelling. IMPRESSION: ORIF of the lateral and medial malleolus fractures, as detailed above. Electronically Signed   By: Margaretha Sheffield MD   On: 02/10/2021 12:27   DG C-Arm 1-60 Min  Result Date: 02/10/2021 CLINICAL DATA:  Elective surgery Z41.9 (ICD-10-CM) EXAM: DG C-ARM 1-60 MIN; RIGHT ANKLE - COMPLETE 3+ VIEW FLUOROSCOPY TIME:  Fluoroscopy Time:  42 seconds. Number of Acquired Spot Images: 5 COMPARISON:  02/06/2021. FINDINGS: Five C-arm fluoroscopic images were obtained intraoperatively and submitted for post operative interpretation. These images demonstrate plate and screw fixation of the distal fibular fracture with 2 screws crossing into the adjacent tibia. Also, two screw fixation of the medial malleolus fracture. Alignment is improved, near anatomic. Redemonstrated posterior malleolus fracture with mild displacement. Please see the performing provider's procedural report for further detail. IMPRESSION: Intraoperative fluoroscopy, as detailed above. Electronically Signed   By: Margaretha Sheffield MD   On: 02/10/2021 12:24      Flora Lipps, MD  [facility] 02/11/2021  If 7PM-7AM, please contact night-coverage

## 2021-02-12 DIAGNOSIS — S82891A Other fracture of right lower leg, initial encounter for closed fracture: Secondary | ICD-10-CM | POA: Diagnosis not present

## 2021-02-12 DIAGNOSIS — E1142 Type 2 diabetes mellitus with diabetic polyneuropathy: Secondary | ICD-10-CM | POA: Diagnosis not present

## 2021-02-12 DIAGNOSIS — J189 Pneumonia, unspecified organism: Secondary | ICD-10-CM | POA: Diagnosis not present

## 2021-02-12 DIAGNOSIS — N184 Chronic kidney disease, stage 4 (severe): Secondary | ICD-10-CM | POA: Diagnosis not present

## 2021-02-12 LAB — GLUCOSE, CAPILLARY
Glucose-Capillary: 127 mg/dL — ABNORMAL HIGH (ref 70–99)
Glucose-Capillary: 129 mg/dL — ABNORMAL HIGH (ref 70–99)
Glucose-Capillary: 104 mg/dL — ABNORMAL HIGH (ref 70–99)
Glucose-Capillary: 147 mg/dL — ABNORMAL HIGH (ref 70–99)

## 2021-02-12 LAB — CBC
HCT: 20.8 % — ABNORMAL LOW (ref 36.0–46.0)
Hemoglobin: 7 g/dL — ABNORMAL LOW (ref 12.0–15.0)
MCH: 33.8 pg (ref 26.0–34.0)
MCHC: 33.7 g/dL (ref 30.0–36.0)
MCV: 100.5 fL — ABNORMAL HIGH (ref 80.0–100.0)
Platelets: 200 10*3/uL (ref 150–400)
RBC: 2.07 MIL/uL — ABNORMAL LOW (ref 3.87–5.11)
RDW: 12.3 % (ref 11.5–15.5)
WBC: 5.1 10*3/uL (ref 4.0–10.5)
nRBC: 0 % (ref 0.0–0.2)

## 2021-02-12 LAB — PREPARE RBC (CROSSMATCH)

## 2021-02-12 MED ORDER — SODIUM CHLORIDE 0.9% IV SOLUTION
Freq: Once | INTRAVENOUS | Status: AC
  Administered 2021-02-12: 10 mL via INTRAVENOUS

## 2021-02-12 NOTE — Progress Notes (Signed)
PROGRESS NOTE    Teresa Daniels  [id number] DOB: 18-Apr-1941 DOA: 02/06/2021 PCP: [name], PA-C   Brief Narrative: Teresa Daniels is a 80 y.o. female with a history of hypertension, diabetes mellitus type 2, CVA, CKD stage IV.  Patient presented secondary to general weakness and multiple falls and was found to have a left ankle fracture.  She was seen by orthopedic surgery who performed an ORIF.  Patient also found to have likely left lower lobe pneumonia and was started on antibiotics.  Subacute anemia likely secondary to fractures/perioperative blood loss which is now stabilized.  Plan for discharge to SNF when bed available.   Assessment & Plan:   Principal Problem:   CAP (community acquired pneumonia) Active Problems:   Diabetic polyneuropathy associated with type 2 diabetes mellitus ([place])   History of CVA (cerebrovascular accident)   CKD (chronic kidney disease) stage 4, GFR 15-29 ml/min (HCC)   Generalized weakness   Fracture dislocation of right ankle   Community acquired pneumonia Chest x-ray significant for left lung base opacity concerning for possible infection.  Patient started on empiric ceftriaxone and azithromycin. -Continue ceftriaxone IV and azithromycin  Acute anemia Perioperative blood loss Blood loss appears to be secondary to fracture in addition to perioperative blood loss.  Postop, globin has stabilized.  No evidence of hematoma. -Since hemoglobin has stabilized, transfuse 1 unit of PRBC  Diabetes mellitus, type 2 Polyneuropathy -Continue SSI  Hypokalemia Resolved with supplementation.  AKI on CKD stage IV Unclear baseline but about 1.4-1.5. Creatinine worsened to a peak of 2.13. no resolved.  Right triamalleolar ankle fracture/dislocation S/p ORIF. Nonweightbearing of right LE. Recommendation for Eliquis on discharge. Follow-up with orthopedic surgery in 2 weeks  History of CVA -Continue Plavix   DVT prophylaxis: Plavix Code Status:    Code Status: Full Code Family Communication: None at bedside.  Patient declined for me to call her daughter. Disposition Plan: Discharge to SNF.  Patient will be medically stable for discharge after completion of blood transfusion.   Consultants:  Orthopedic surgery  Procedures:  ORIF (RIGHT ANKLE)  Antimicrobials: Ceftriaxone IV Azithromycin PO    Subjective: No concerns today.   Objective: Vitals:   02/12/21 0734 02/12/21 0822 02/12/21 1135 02/12/21 1158  BP: (!) 149/77  (!) 154/73 (!) 142/86  Pulse: 74  90 70  Resp: '18  18 18  '$ Temp: 98.5 F (36.9 C)  98.3 F (36.8 C) 98.3 F (36.8 C)  TempSrc: Oral  Oral Oral  SpO2: 98% 98% 96% 99%  Weight:      Height:        Intake/Output Summary (Last 24 hours) at 02/12/2021 1313 Last data filed at 02/12/2021 0900 Gross per 24 hour  Intake 360 ml  Output 1150 ml  Net -790 ml   Filed Weights   02/06/21 1927  Weight: 68 kg    Examination:  General exam: Appears calm and comfortable Respiratory system: Clear to auscultation. Respiratory effort normal. Cardiovascular system: S1 & S2 heard, RRR. No murmurs, rubs, gallops or clicks. Gastrointestinal system: Abdomen is nondistended, soft and nontender. No organomegaly or masses felt. Normal bowel sounds heard. Central nervous system: Alert and oriented. No focal neurological deficits. Musculoskeletal: No edema. No calf tenderness. Right leg brace Skin: No cyanosis. No rashes Psychiatry: Judgement and insight appear normal. Mood & affect appropriate.     Data Reviewed: I have personally reviewed following labs and imaging studies  CBC Lab Results  Component Value Date   WBC 5.1 02/12/2021  RBC 2.07 (L) 02/12/2021   HGB 7.0 (L) 02/12/2021   HCT 20.8 (L) 02/12/2021   MCV 100.5 (H) 02/12/2021   MCH 33.8 02/12/2021   PLT 200 02/12/2021   MCHC 33.7 02/12/2021   RDW 12.3 02/12/2021   LYMPHSABS 0.8 02/06/2021   MONOABS 0.4 02/06/2021   EOSABS 0.0 02/06/2021   BASOSABS  0.0 [id number]     Last metabolic panel Lab Results  Component Value Date   NA 137 02/11/2021   K 4.1 02/11/2021   CL 103 02/11/2021   CO2 27 02/11/2021   BUN 15 02/11/2021   CREATININE 1.32 (H) 02/11/2021   GLUCOSE 150 (H) 02/11/2021   GFRNONAA 41 (L) 02/11/2021   GFRAA >60 08/09/2019   CALCIUM 8.2 (L) 02/11/2021   PHOS 3.3 02/08/2021   PROT 6.1 (L) 02/06/2021   ALBUMIN 3.6 02/06/2021   BILITOT 0.8 02/06/2021   ALKPHOS 92 02/06/2021   AST 16 02/06/2021   ALT 15 02/06/2021   ANIONGAP 7 02/11/2021    CBG (last 3)  Recent Labs    02/11/21 2007 02/12/21 0636 02/12/21 1142  GLUCAP 125* 127* 129*     GFR: Estimated Creatinine Clearance: 31.1 mL/min (A) (by C-G formula based on SCr of 1.32 mg/dL (H)).  Coagulation Profile: No results for input(s): INR, PROTIME in the last 168 hours.  Recent Results (from the past 240 hour(s))  Blood culture (routine x 2)     Status: None   Collection Time: 02/06/21  9:15 PM   Specimen: BLOOD LEFT HAND  Result Value Ref Range Status   Specimen Description BLOOD LEFT HAND  Final   Special Requests   Final    BOTTLES DRAWN AEROBIC AND ANAEROBIC Blood Culture results may not be optimal due to an inadequate volume of blood received in culture bottles   Culture   Final    NO GROWTH 5 DAYS Performed at [facility], [place] [address] [place], [place] [address]    Report Status 02/11/2021 FINAL  Final  Blood culture (routine x 2)     Status: None   Collection Time: 02/06/21  9:15 PM   Specimen: BLOOD RIGHT HAND  Result Value Ref Range Status   Specimen Description BLOOD RIGHT HAND  Final   Special Requests   Final    BOTTLES DRAWN AEROBIC AND ANAEROBIC Blood Culture results may not be optimal due to an inadequate volume of blood received in culture bottles   Culture   Final    NO GROWTH 5 DAYS Performed at [facility], [place] [address], [place], [place] [address]    Report Status 02/11/2021 FINAL  Final  Resp Panel by  RT-PCR (Flu A&B, Covid) Nasopharyngeal Swab     Status: None   Collection Time: 02/06/21 10:21 PM   Specimen: Nasopharyngeal Swab; Nasopharyngeal(NP) swabs in vial transport medium  Result Value Ref Range Status   SARS Coronavirus 2 by RT PCR NEGATIVE NEGATIVE Final    Comment: (NOTE) SARS-CoV-2 target nucleic acids are NOT DETECTED.  The SARS-CoV-2 RNA is generally detectable in upper respiratory specimens during the acute phase of infection. The lowest concentration of SARS-CoV-2 viral copies this assay can detect is 138 copies/mL. A negative result does not preclude SARS-Cov-2 infection and should not be used as the sole basis for treatment or other patient management decisions. A negative result may occur with  improper specimen collection/handling, submission of specimen other than nasopharyngeal swab, presence of viral mutation(s) within the areas targeted by  this assay, and inadequate number of viral copies(<138 copies/mL). A negative result must be combined with clinical observations, patient history, and epidemiological information. The expected result is Negative.  Fact Sheet for Patients:  EntrepreneurPulse.com.au  Fact Sheet for Healthcare Providers:  IncredibleEmployment.be  This test is no t yet approved or cleared by the [place] FDA and  has been authorized for detection and/or diagnosis of SARS-CoV-2 by FDA under an Emergency Use Authorization (EUA). This EUA will remain  in effect (meaning this test can be used) for the duration of the COVID-19 declaration under Section 564(b)(1) of the Act, 21 U.S.C.section 360bbb-3(b)(1), unless the authorization is terminated  or revoked sooner.       Influenza A by PCR NEGATIVE NEGATIVE Final   Influenza B by PCR NEGATIVE NEGATIVE Final    Comment: (NOTE) The Xpert Xpress SARS-CoV-2/FLU/RSV plus assay is intended as an aid in the diagnosis of influenza from Nasopharyngeal swab  specimens and should not be used as a sole basis for treatment. Nasal washings and aspirates are unacceptable for Xpert Xpress SARS-CoV-2/FLU/RSV testing.  Fact Sheet for Patients: EntrepreneurPulse.com.au  Fact Sheet for Healthcare Providers: IncredibleEmployment.be  This test is not yet approved or cleared by the [place] FDA and has been authorized for detection and/or diagnosis of SARS-CoV-2 by FDA under an Emergency Use Authorization (EUA). This EUA will remain in effect (meaning this test can be used) for the duration of the COVID-19 declaration under Section 564(b)(1) of the Act, 21 U.S.C. section 360bbb-3(b)(1), unless the authorization is terminated or revoked.  Performed at [facility], [place] [address] [place], [place] [address]         Radiology Studies: No results found.      Scheduled Meds:  atorvastatin  40 mg Oral QPM   azithromycin  500 mg Oral QHS   budesonide  0.5 mg Inhalation BID   cholecalciferol  1,000 Units Oral Daily   clopidogrel  75 mg Oral Daily   docusate sodium  100 mg Oral BID   insulin aspart  0-5 Units Subcutaneous QHS   insulin aspart  0-9 Units Subcutaneous TID WC   lidocaine-EPINEPHrine  10 mL Intradermal Once   montelukast  10 mg Oral Q1200   oxyCODONE  2.5 mg Oral Once   Continuous Infusions:  cefTRIAXone (ROCEPHIN)  IV 1 g (02/11/21 2234)     LOS: 6 days     Cordelia Poche, MD [facility] 02/12/2021, 1:13 PM  If 7PM-7AM, please contact night-coverage [email or web address]

## 2021-02-12 NOTE — NC FL2 (Signed)
[place] LEVEL OF CARE SCREENING TOOL     IDENTIFICATION  Patient Name: Teresa Daniels Birthdate: [birth year] Sex: female Admission Date (Current Location): 02/06/2021  [facility] and [place] Number:  Herbalist and Address:  [facility], [place] [address] [place], [place] [address]      Provider Number: [id number]  Attending Physician Name and Address:  Mariel Aloe, MD  Relative Name and Phone Number:       Current Level of Care: Hospital Recommended Level of Care: [place] Prior Approval Number:    Date Approved/Denied:   PASRR Number: [id number]  Discharge Plan:      Current Diagnoses: Patient Active Problem List   Diagnosis Date Noted   CAP (community acquired pneumonia) 02/06/2021   CKD (chronic kidney disease) stage 4, GFR 15-29 ml/min ([place]) 02/06/2021   Generalized weakness 02/06/2021   Fracture dislocation of right ankle 02/06/2021   COVID-19 08/08/2019   Acute respiratory failure with hypoxemia ([place]) 08/08/2019   History of CVA (cerebrovascular accident) 08/08/2019   Anemia 08/08/2019   CKD (chronic kidney disease), stage III ([place]) 08/08/2019   Dehydration    Vitamin B12 deficiency 02/10/2019   Unsteady gait 02/10/2019   Ischemic stroke ([place]) 12/06/2017   Diabetic polyneuropathy associated with type 2 diabetes mellitus ([place]) 12/06/2017    Orientation RESPIRATION BLADDER Height & Weight     Self, Time, Situation, Place  Normal Continent Weight: 150 lb (68 kg) Height:  '5\' 5"'$  (165.1 cm)  BEHAVIORAL SYMPTOMS/MOOD NEUROLOGICAL BOWEL NUTRITION STATUS      Continent Diet (See DC summary)  AMBULATORY STATUS COMMUNICATION OF NEEDS Skin   Extensive Assist Verbally Surgical wounds (Right ankle)                       Personal Care Assistance Level of Assistance  Bathing, Feeding, Dressing Bathing Assistance: Maximum assistance Feeding assistance: Limited assistance Dressing Assistance:  Maximum assistance     Functional Limitations Info  Sight, Hearing, Speech Sight Info: Adequate Hearing Info: Adequate Speech Info: Adequate    SPECIAL CARE FACTORS FREQUENCY  PT (By licensed PT), OT (By licensed OT)     PT Frequency: 5x a week OT Frequency: 5x a week            Contractures Contractures Info: Not present    Additional Factors Info  Allergies, Code Status Code Status Info: Full Allergies Info: NKA           Current Medications (02/12/2021):  This is the current hospital active medication list Current Facility-Administered Medications  Medication Dose Route Frequency Provider Last Rate Last Admin   acetaminophen (TYLENOL) tablet 650 mg  650 mg Oral Q6H PRN Delray Alt, PA-C   650 mg at 02/11/21 2154   Or   acetaminophen (TYLENOL) suppository 650 mg  650 mg Rectal Q6H PRN Delray Alt, PA-C       albuterol (VENTOLIN HFA) 108 (90 Base) MCG/ACT inhaler 2 puff  2 puff Inhalation Q4H PRN Patrecia Pace A, PA-C       atorvastatin (LIPITOR) tablet 40 mg  40 mg Oral QPM Patrecia Pace A, PA-C   40 mg at 02/11/21 1824   azithromycin (ZITHROMAX) tablet 500 mg  500 mg Oral QHS Patrecia Pace A, PA-C   500 mg at 02/11/21 2155   budesonide (PULMICORT) nebulizer solution 0.5 mg  0.5 mg Inhalation BID Delray Alt, PA-C   0.5 mg at 02/12/21  [id number]   cefTRIAXone (ROCEPHIN) 1 g in sodium chloride 0.9 % 100 mL IVPB  1 g Intravenous Q24H Delray Alt, PA-C   Stopped at 02/11/21 2304   cholecalciferol (VITAMIN D3) tablet 1,000 Units  1,000 Units Oral Daily Delray Alt, PA-C   1,000 Units at 02/12/21 [id number]   clopidogrel (PLAVIX) tablet 75 mg  75 mg Oral Daily Delray Alt, PA-C   75 mg at 02/12/21 [id number]   docusate sodium (COLACE) capsule 100 mg  100 mg Oral BID Delray Alt, PA-C   100 mg at 02/12/21 [id number]   HYDROmorphone (DILAUDID) injection 0.5 mg  0.5 mg Intravenous Q3H PRN Delray Alt, PA-C       insulin aspart (novoLOG) injection 0-5 Units  0-5 Units  Subcutaneous QHS Yacobi, Sarah A, PA-C       insulin aspart (novoLOG) injection 0-9 Units  0-9 Units Subcutaneous TID WC Delray Alt, PA-C   1 Units at 02/12/21 1241   lidocaine-EPINEPHrine (XYLOCAINE W/EPI) 2 %-1:200000 (PF) injection 10 mL  10 mL Intradermal Once Delray Alt, PA-C       metoCLOPramide (REGLAN) tablet 5 mg  5 mg Oral Q8H PRN Delray Alt, PA-C       Or   metoCLOPramide (REGLAN) injection 5 mg  5 mg Intravenous Q8H PRN Delray Alt, PA-C       montelukast (SINGULAIR) tablet 10 mg  10 mg Oral Q1200 Patrecia Pace A, PA-C   10 mg at 02/12/21 1240   ondansetron (ZOFRAN) tablet 4 mg  4 mg Oral Q6H PRN Delray Alt, PA-C       Or   ondansetron (ZOFRAN) injection 4 mg  4 mg Intravenous Q6H PRN Patrecia Pace A, PA-C       oxyCODONE (Oxy IR/ROXICODONE) immediate release tablet 2.5 mg  2.5 mg Oral Once Yacobi, Sarah A, PA-C       oxyCODONE (Oxy IR/ROXICODONE) immediate release tablet 5 mg  5 mg Oral Q6H PRN Patrecia Pace A, PA-C   5 mg at 02/10/21 2246   polyethylene glycol (MIRALAX / GLYCOLAX) packet 17 g  17 g Oral Daily PRN Delray Alt, PA-C       senna-docusate (Senokot-S) tablet 1 tablet  1 tablet Oral QHS PRN Delray Alt, PA-C   1 tablet at 02/07/21 2200     Discharge Medications: Please see discharge summary for a list of discharge medications.  Relevant Imaging Results:  Relevant Lab Results:   Additional Information SSN: [id number]  Emeterio Reeve, LCSW

## 2021-02-12 NOTE — Progress Notes (Signed)
Started 1unit PRBC, no adverse reactions noted, vital signs taken and recorded, will continue to monitor.  Pt sitting comfortably in the chair, denies any pain.

## 2021-02-12 NOTE — TOC Progression Note (Signed)
Transition of Care [facility]) - Progression Note    Patient Details  Name: Teresa Daniels MRN: [id number] Date of Birth: June 29, 1941  Transition of Care [facility]) CM/SW Contact  Emeterio Reeve, [place] Phone Number: 02/12/2021, 1:43 PM  Clinical Narrative:     CSW spoke to pts daughter Kellie Shropshire on phone. Kellie Shropshire stated she would like to have CIR re-evaluate pt. Kellie Shropshire believes pt was too tired to participate at her best. CSW explained that she can make the request but it would be up to the PT/ CIR team on if pt will be re-evaluated. Kellie Shropshire stated she understood.   Kellie Shropshire gave CSW permission to fax out to SNF's in the area as a secondary option to CIR. Kellie Shropshire states she does not want pt to be faxed out to [facility] or [facility].   Expected Discharge Plan: [place] (vs SNF placement) Barriers to Discharge: Continued Medical Work up  Expected Discharge Plan and Services Expected Discharge Plan: [place] (vs SNF placement)                                               Social Determinants of Health (SDOH) Interventions    Readmission Risk Interventions No flowsheet data found.  Emeterio Reeve, Latanya Presser, [place] Social Worker [phone]

## 2021-02-12 NOTE — Plan of Care (Signed)
  Problem: Health Behavior/Discharge Planning: Goal: Ability to manage health-related needs will improve Outcome: Progressing   Problem: Clinical Measurements: Goal: Ability to maintain clinical measurements within normal limits will improve Outcome: Progressing   Problem: Activity: Goal: Risk for activity intolerance will decrease Outcome: Progressing   Problem: Nutrition: Goal: Adequate nutrition will be maintained Outcome: Progressing   Problem: Coping: Goal: Level of anxiety will decrease Outcome: Progressing   Problem: Elimination: Goal: Will not experience complications related to urinary retention Outcome: Progressing   Problem: Pain Managment: Goal: General experience of comfort will improve Outcome: Progressing   Problem: Safety: Goal: Ability to remain free from injury will improve Outcome: Progressing   Problem: Skin Integrity: Goal: Risk for impaired skin integrity will decrease Outcome: Progressing

## 2021-02-12 NOTE — Progress Notes (Signed)
SPORTS MEDICINE AND JOINT REPLACEMENT  Lara Mulch, MD    Carlyon Shadow, PA-C [place], [place], [place]  [address]                             [phone]   PROGRESS NOTE  Subjective:  negative for Chest Pain  negative for Shortness of Breath  negative for Nausea/Vomiting   negative for Calf Pain  negative for Bowel Movement   Tolerating Diet: yes         Patient reports pain as 3 on 0-10 scale.    Objective: Vital signs in last 24 hours:   Patient Vitals for the past 24 hrs:  BP Temp Temp src Pulse Resp SpO2  02/12/21 0734 (!) 149/77 98.5 F (36.9 C) Oral 74 18 98 %  02/11/21 2153 138/84 99.8 F (37.7 C) Oral 84 16 96 %  02/11/21 2044 -- -- -- -- -- 98 %  02/11/21 1251 (!) 129/59 98 F (36.7 C) -- 82 18 99 %    '@flow'$ {1959:LAST@   Intake/Output from previous day:   08/05 0701 - 08/06 0700 In: -  Out: 600 [Urine:600]   Intake/Output this shift:   No intake/output data recorded.   Intake/Output      08/05 0701 08/06 0700 08/06 0701 08/07 0700   I.V. (mL/kg)     Total Intake(mL/kg)     Urine (mL/kg/hr) 600 (0.4)    Stool 0    Blood     Total Output 600    Net -600         Urine Occurrence 2 x    Stool Occurrence 1 x       LABORATORY DATA: Recent Labs    02/06/21 1926 02/07/21 0345 02/08/21 0151 02/09/21 0213 02/10/21 1531 02/11/21 0252 02/12/21 0209  WBC 4.9 3.9* 5.9 5.0 8.9 4.8 5.1  HGB 9.4* 8.6* 7.9* 8.3* 9.7* 7.0* 7.0*  HCT 28.7* 26.6* 23.4* 24.6* 27.6* 20.9* 20.8*  PLT 180 152 156 153 175 186 200   Recent Labs    02/06/21 1926 02/07/21 0345 02/08/21 0151 02/09/21 0213 02/10/21 1531 02/11/21 0252  NA 140 139 138 139  --  137  K 3.4* 3.3* 3.0* 3.4*  --  4.1  CL 102 101 101 100  --  103  CO2 30 30 32 28  --  27  BUN 30* 29* 24* 19  --  15  CREATININE 2.13* 1.89* 1.61* 1.42* 1.17* 1.32*  GLUCOSE 139* 191* 147* 128*  --  150*  CALCIUM 9.0 8.4* 8.4* 8.7*  --  8.2*   No results found for: INR,  PROTIME  Examination:  General appearance: alert, cooperative, and no distress Extremities: extremities normal, atraumatic, no cyanosis or edema  Wound Exam: clean, dry, intact   Drainage:  None: wound tissue dry  Motor Exam: Quadriceps and Hamstrings Intact  Sensory Exam: Superficial Peroneal, Deep Peroneal, and Tibial diminished   Assessment:    2 Days Post-Op  Procedure(s) (LRB): OPEN REDUCTION INTERNAL FIXATION (ORIF) ANKLE FRACTURE (Right)  ADDITIONAL DIAGNOSIS:  Principal Problem:   CAP (community acquired pneumonia) Active Problems:   Diabetic polyneuropathy associated with type 2 diabetes mellitus (HCC)   History of CVA (cerebrovascular accident)   CKD (chronic kidney disease) stage 4, GFR 15-29 ml/min (HCC)   Generalized weakness   Fracture dislocation of right ankle     Plan: Physical Therapy as ordered Non Weight Bearing (NWB)  DVT  Prophylaxis:  on plavix, continue to hold lovenox until HGB stabilizes. Transition to Eliquis at discharge  DISCHARGE PLAN: [place] Facility/Rehab  patient will require SNF.  Okay for discharge from ortho standpoint once cleared by medicine team and therapies.  Have signed and placed discharge Rx for pain medication and Eliquis in patient's chart.   Donia Ast 02/12/2021, 8:05 AM

## 2021-02-13 DIAGNOSIS — J189 Pneumonia, unspecified organism: Secondary | ICD-10-CM | POA: Diagnosis not present

## 2021-02-13 DIAGNOSIS — N184 Chronic kidney disease, stage 4 (severe): Secondary | ICD-10-CM | POA: Diagnosis not present

## 2021-02-13 DIAGNOSIS — S82891A Other fracture of right lower leg, initial encounter for closed fracture: Secondary | ICD-10-CM | POA: Diagnosis not present

## 2021-02-13 DIAGNOSIS — E1142 Type 2 diabetes mellitus with diabetic polyneuropathy: Secondary | ICD-10-CM | POA: Diagnosis not present

## 2021-02-13 LAB — CBC
HCT: 27.2 % — ABNORMAL LOW (ref 36.0–46.0)
Hemoglobin: 9.3 g/dL — ABNORMAL LOW (ref 12.0–15.0)
MCH: 32.9 pg (ref 26.0–34.0)
MCHC: 34.2 g/dL (ref 30.0–36.0)
MCV: 96.1 fL (ref 80.0–100.0)
Platelets: 259 10*3/uL (ref 150–400)
RBC: 2.83 MIL/uL — ABNORMAL LOW (ref 3.87–5.11)
RDW: 13.8 % (ref 11.5–15.5)
WBC: 6.5 10*3/uL (ref 4.0–10.5)
nRBC: 0 % (ref 0.0–0.2)

## 2021-02-13 LAB — GLUCOSE, CAPILLARY
Glucose-Capillary: 143 mg/dL — ABNORMAL HIGH (ref 70–99)
Glucose-Capillary: 149 mg/dL — ABNORMAL HIGH (ref 70–99)
Glucose-Capillary: 119 mg/dL — ABNORMAL HIGH (ref 70–99)
Glucose-Capillary: 127 mg/dL — ABNORMAL HIGH (ref 70–99)
Glucose-Capillary: 137 mg/dL — ABNORMAL HIGH (ref 70–99)

## 2021-02-13 NOTE — Progress Notes (Signed)
PROGRESS NOTE    Teresa Daniels  [id number] DOB: September 09, 1940 DOA: 02/06/2021 PCP: [name], PA-C   Brief Narrative: Teresa Daniels is a 80 y.o. female with a history of hypertension, diabetes mellitus type 2, CVA, CKD stage IV.  Patient presented secondary to general weakness and multiple falls and was found to have a left ankle fracture.  She was seen by orthopedic surgery who performed an ORIF.  Patient also found to have likely left lower lobe pneumonia and was started on antibiotics.  Subacute anemia likely secondary to fractures/perioperative blood loss which is now stabilized.  Plan for discharge to SNF when bed available.   Assessment & Plan:   Principal Problem:   CAP (community acquired pneumonia) Active Problems:   Diabetic polyneuropathy associated with type 2 diabetes mellitus ([place])   History of CVA (cerebrovascular accident)   CKD (chronic kidney disease) stage 4, GFR 15-29 ml/min (HCC)   Generalized weakness   Fracture dislocation of right ankle   Community acquired pneumonia Chest x-ray significant for left lung base opacity concerning for possible infection.  Patient started on empiric ceftriaxone and azithromycin. -Continue ceftriaxone IV and azithromycin  Acute anemia Perioperative blood loss Blood loss appears to be secondary to fracture in addition to perioperative blood loss.  Postop, globin has stabilized.  No evidence of hematoma. Transfused 1 unit of PRBC of 8/6. Rebound hemoglobin of 9.3  Diabetes mellitus, type 2 Polyneuropathy -Continue SSI  Hypokalemia Resolved with supplementation.  AKI on CKD stage IV Unclear baseline but about 1.4-1.5. Creatinine worsened to a peak of 2.13. no resolved.  Right triamalleolar ankle fracture/dislocation S/p ORIF. Nonweightbearing of right LE. Recommendation for Eliquis on discharge. Follow-up with orthopedic surgery in 2 weeks  History of CVA -Continue Plavix   DVT prophylaxis: Plavix, SCDs Code  Status:   Code Status: Full Code Family Communication: None at bedside Disposition Plan: Discharge to SNF.  Medically stable for discharge   Consultants:  Orthopedic surgery  Procedures:  ORIF (RIGHT ANKLE)  Antimicrobials: Ceftriaxone IV Azithromycin PO    Subjective: No issues overnight. States her son-in-law's mother is planning to visit her today.  Objective: Vitals:   02/12/21 1942 02/12/21 2031 02/13/21 0757 02/13/21 0826  BP:  (!) 181/73 (!) 141/98   Pulse:  74 81   Resp:  16 18   Temp:  98.8 F (37.1 C) 98 F (36.7 C)   TempSrc:  Oral Oral   SpO2: 98% 98% 100% 100%  Weight:      Height:        Intake/Output Summary (Last 24 hours) at 02/13/2021 1329 Last data filed at 02/13/2021 1243 Gross per 24 hour  Intake 1128 ml  Output 400 ml  Net 728 ml    Filed Weights   02/06/21 1927  Weight: 68 kg    Examination:  General exam: Appears calm and comfortable  Respiratory system: Clear to auscultation. Respiratory effort normal. Cardiovascular system: S1 & S2 heard, RRR. No murmurs, rubs, gallops or clicks. Gastrointestinal system: Abdomen is nondistended, soft and nontender. No organomegaly or masses felt. Normal bowel sounds heard. Central nervous system: Alert. Musculoskeletal: No edema. No calf tenderness Skin: No cyanosis. No rashes    Data Reviewed: I have personally reviewed following labs and imaging studies  CBC Lab Results  Component Value Date   WBC 6.5 02/13/2021   RBC 2.83 (L) 02/13/2021   HGB 9.3 (L) 02/13/2021   HCT 27.2 (L) 02/13/2021   MCV 96.1 02/13/2021   MCH 32.9  02/13/2021   PLT 259 02/13/2021   MCHC 34.2 02/13/2021   RDW 13.8 02/13/2021   LYMPHSABS 0.8 02/06/2021   MONOABS 0.4 02/06/2021   EOSABS 0.0 02/06/2021   BASOSABS 0.0 Q000111Q     Last metabolic panel Lab Results  Component Value Date   NA 137 02/11/2021   K 4.1 02/11/2021   CL 103 02/11/2021   CO2 27 02/11/2021   BUN 15 02/11/2021   CREATININE 1.32 (H)  02/11/2021   GLUCOSE 150 (H) 02/11/2021   GFRNONAA 41 (L) 02/11/2021   GFRAA >60 08/09/2019   CALCIUM 8.2 (L) 02/11/2021   PHOS 3.3 02/08/2021   PROT 6.1 (L) 02/06/2021   ALBUMIN 3.6 02/06/2021   BILITOT 0.8 02/06/2021   ALKPHOS 92 02/06/2021   AST 16 02/06/2021   ALT 15 02/06/2021   ANIONGAP 7 02/11/2021    CBG (last 3)  Recent Labs    02/12/21 2032 02/13/21 0633 02/13/21 1115  GLUCAP 147* 143* 149*      GFR: Estimated Creatinine Clearance: 31.1 mL/min (A) (by C-G formula based on SCr of 1.32 mg/dL (H)).  Coagulation Profile: No results for input(s): INR, PROTIME in the last 168 hours.  Recent Results (from the past 240 hour(s))  Blood culture (routine x 2)     Status: None   Collection Time: 02/06/21  9:15 PM   Specimen: BLOOD LEFT HAND  Result Value Ref Range Status   Specimen Description BLOOD LEFT HAND  Final   Special Requests   Final    BOTTLES DRAWN AEROBIC AND ANAEROBIC Blood Culture results may not be optimal due to an inadequate volume of blood received in culture bottles   Culture   Final    NO GROWTH 5 DAYS Performed at [facility], [place] [address] [place], [place] [address]    Report Status 02/11/2021 FINAL  Final  Blood culture (routine x 2)     Status: None   Collection Time: 02/06/21  9:15 PM   Specimen: BLOOD RIGHT HAND  Result Value Ref Range Status   Specimen Description BLOOD RIGHT HAND  Final   Special Requests   Final    BOTTLES DRAWN AEROBIC AND ANAEROBIC Blood Culture results may not be optimal due to an inadequate volume of blood received in culture bottles   Culture   Final    NO GROWTH 5 DAYS Performed at [facility], [place] [address] [place], [place] [address]    Report Status 02/11/2021 FINAL  Final  Resp Panel by RT-PCR (Flu A&B, Covid) Nasopharyngeal Swab     Status: None   Collection Time: 02/06/21 10:21 PM   Specimen: Nasopharyngeal Swab; Nasopharyngeal(NP) swabs in vial transport medium  Result Value Ref Range  Status   SARS Coronavirus 2 by RT PCR NEGATIVE NEGATIVE Final    Comment: (NOTE) SARS-CoV-2 target nucleic acids are NOT DETECTED.  The SARS-CoV-2 RNA is generally detectable in upper respiratory specimens during the acute phase of infection. The lowest concentration of SARS-CoV-2 viral copies this assay can detect is 138 copies/mL. A negative result does not preclude SARS-Cov-2 infection and should not be used as the sole basis for treatment or other patient management decisions. A negative result may occur with  improper specimen collection/handling, submission of specimen other than nasopharyngeal swab, presence of viral mutation(s) within the areas targeted by this assay, and inadequate number of viral copies(<138 copies/mL). A negative result must be combined with clinical observations, patient history, and epidemiological information. The expected  result is Negative.  Fact Sheet for Patients:  EntrepreneurPulse.com.au  Fact Sheet for Healthcare Providers:  IncredibleEmployment.be  This test is no t yet approved or cleared by the [place] FDA and  has been authorized for detection and/or diagnosis of SARS-CoV-2 by FDA under an Emergency Use Authorization (EUA). This EUA will remain  in effect (meaning this test can be used) for the duration of the COVID-19 declaration under Section 564(b)(1) of the Act, 21 U.S.C.section 360bbb-3(b)(1), unless the authorization is terminated  or revoked sooner.       Influenza A by PCR NEGATIVE NEGATIVE Final   Influenza B by PCR NEGATIVE NEGATIVE Final    Comment: (NOTE) The Xpert Xpress SARS-CoV-2/FLU/RSV plus assay is intended as an aid in the diagnosis of influenza from Nasopharyngeal swab specimens and should not be used as a sole basis for treatment. Nasal washings and aspirates are unacceptable for Xpert Xpress SARS-CoV-2/FLU/RSV testing.  Fact Sheet for  Patients: EntrepreneurPulse.com.au  Fact Sheet for Healthcare Providers: IncredibleEmployment.be  This test is not yet approved or cleared by the [place] FDA and has been authorized for detection and/or diagnosis of SARS-CoV-2 by FDA under an Emergency Use Authorization (EUA). This EUA will remain in effect (meaning this test can be used) for the duration of the COVID-19 declaration under Section 564(b)(1) of the Act, 21 U.S.C. section 360bbb-3(b)(1), unless the authorization is terminated or revoked.  Performed at [facility], [place] [address], [place], [place] [address]          Radiology Studies: No results found.      Scheduled Meds:  atorvastatin  40 mg Oral QPM   azithromycin  500 mg Oral QHS   budesonide  0.5 mg Inhalation BID   cholecalciferol  1,000 Units Oral Daily   clopidogrel  75 mg Oral Daily   docusate sodium  100 mg Oral BID   insulin aspart  0-5 Units Subcutaneous QHS   insulin aspart  0-9 Units Subcutaneous TID WC   lidocaine-EPINEPHrine  10 mL Intradermal Once   montelukast  10 mg Oral Q1200   oxyCODONE  2.5 mg Oral Once   Continuous Infusions:  cefTRIAXone (ROCEPHIN)  IV 1 g (02/12/21 2109)     LOS: 7 days     Cordelia Poche, MD [facility] 02/13/2021, 1:29 PM  If 7PM-7AM, please contact night-coverage [email or web address]

## 2021-02-13 NOTE — Plan of Care (Signed)
  Problem: Health Behavior/Discharge Planning: Goal: Ability to manage health-related needs will improve Outcome: Progressing   Problem: Clinical Measurements: Goal: Ability to maintain clinical measurements within normal limits will improve Outcome: Progressing   Problem: Activity: Goal: Risk for activity intolerance will decrease Outcome: Progressing   Problem: Nutrition: Goal: Adequate nutrition will be maintained Outcome: Progressing   Problem: Coping: Goal: Level of anxiety will decrease Outcome: Progressing   Problem: Elimination: Goal: Will not experience complications related to urinary retention Outcome: Progressing   Problem: Pain Managment: Goal: General experience of comfort will improve Outcome: Progressing   Problem: Safety: Goal: Ability to remain free from injury will improve Outcome: Progressing   Problem: Skin Integrity: Goal: Risk for impaired skin integrity will decrease Outcome: Progressing

## 2021-02-14 DIAGNOSIS — E1142 Type 2 diabetes mellitus with diabetic polyneuropathy: Secondary | ICD-10-CM | POA: Diagnosis not present

## 2021-02-14 DIAGNOSIS — N184 Chronic kidney disease, stage 4 (severe): Secondary | ICD-10-CM | POA: Diagnosis not present

## 2021-02-14 DIAGNOSIS — J189 Pneumonia, unspecified organism: Secondary | ICD-10-CM | POA: Diagnosis not present

## 2021-02-14 DIAGNOSIS — S82891A Other fracture of right lower leg, initial encounter for closed fracture: Secondary | ICD-10-CM | POA: Diagnosis not present

## 2021-02-14 LAB — GLUCOSE, CAPILLARY
Glucose-Capillary: 147 mg/dL — ABNORMAL HIGH (ref 70–99)
Glucose-Capillary: 133 mg/dL — ABNORMAL HIGH (ref 70–99)
Glucose-Capillary: 140 mg/dL — ABNORMAL HIGH (ref 70–99)
Glucose-Capillary: 126 mg/dL — ABNORMAL HIGH (ref 70–99)

## 2021-02-14 LAB — TYPE AND SCREEN
ABO/RH(D): A POS
Antibody Screen: NEGATIVE
Unit division: 0

## 2021-02-14 LAB — BPAM RBC
Blood Product Expiration Date: 202209022359
ISSUE DATE / TIME: 202208061127
Unit Type and Rh: 6200

## 2021-02-14 NOTE — Progress Notes (Signed)
Inpatient Rehab Admissions Coordinator:   Asked to re-assess pt for CIR.  No updated therapy notes, but pt participated well in therapy on 8/5.  Barrier to CIR is that patient does not have the medical necessity to support a CIR admission and  [employer] would not approve CIR admission for a trimalleolar fracture.  SNF continues to be appropriate level of care.   Shann Medal, PT, DPT Admissions Coordinator [phone] 02/14/21  1:41 PM

## 2021-02-14 NOTE — Progress Notes (Signed)
PROGRESS NOTE    Teresa Daniels  [id number] DOB: 06-11-1941 DOA: 02/06/2021 PCP: [name], PA-C   Brief Narrative: Brittant Puller is a 80 y.o. female with a history of hypertension, diabetes mellitus type 2, CVA, CKD stage IV.  Patient presented secondary to general weakness and multiple falls and was found to have a left ankle fracture.  She was seen by orthopedic surgery who performed an ORIF.  Patient also found to have likely left lower lobe pneumonia and was started on antibiotics.  Subacute anemia likely secondary to fractures/perioperative blood loss which is now stabilized.  Plan for discharge to SNF when bed available.   Assessment & Plan:   Principal Problem:   CAP (community acquired pneumonia) Active Problems:   Diabetic polyneuropathy associated with type 2 diabetes mellitus ([place])   History of CVA (cerebrovascular accident)   CKD (chronic kidney disease) stage 4, GFR 15-29 ml/min (HCC)   Generalized weakness   Fracture dislocation of right ankle   Community acquired pneumonia Chest x-ray significant for left lung base opacity concerning for possible infection.  Patient started on empiric ceftriaxone and azithromycin. -Continue ceftriaxone IV and azithromycin  Acute anemia Perioperative blood loss Blood loss appears to be secondary to fracture in addition to perioperative blood loss.  Postop, globin has stabilized.  No evidence of hematoma. Transfused 1 unit of PRBC of 8/6. Rebound hemoglobin of 9.3  Diabetes mellitus, type 2 Polyneuropathy -Continue SSI  Hypokalemia Resolved with supplementation.  AKI on CKD stage IV Unclear baseline but about 1.4-1.5. Creatinine worsened to a peak of 2.13. no resolved.  Right triamalleolar ankle fracture/dislocation S/p ORIF. Nonweightbearing of right LE. Recommendation for Eliquis on discharge. Follow-up with orthopedic surgery in 2 weeks  History of CVA -Continue Plavix  Recent head laceration Two staples placed  in ED on 7/29. Currently 10 days out. -Remove staples (2) today   DVT prophylaxis: Plavix, SCDs Code Status:   Code Status: Full Code Family Communication: Daughter at bedside Disposition Plan: Discharge to SNF.  Medically stable for discharge   Consultants:  Orthopedic surgery  Procedures:  ORIF (RIGHT ANKLE)  Antimicrobials: Ceftriaxone IV Azithromycin PO    Subjective: No issues  Objective: Vitals:   02/13/21 1420 02/13/21 2042 02/13/21 2046 02/14/21 0730  BP: 137/78  (!) 166/83 (!) 153/76  Pulse: 71  71 74  Resp: '18  18 16  '$ Temp: 98 F (36.7 C)  99.3 F (37.4 C) 98.5 F (36.9 C)  TempSrc: Oral  Oral Oral  SpO2: 98% 97% 97% 96%  Weight:      Height:        Intake/Output Summary (Last 24 hours) at 02/14/2021 1312 Last data filed at 02/14/2021 1121 Gross per 24 hour  Intake 240 ml  Output 950 ml  Net -710 ml    Filed Weights   02/06/21 1927  Weight: 68 kg    Examination:  General exam: Appears calm and comfortable HEENT: head laceration with scab and two staples. Respiratory system: Clear to auscultation. Respiratory effort normal. Cardiovascular system: S1 & S2 heard, RRR. No murmurs, rubs, gallops or clicks. Gastrointestinal system: Abdomen is nondistended, soft and nontender. No organomegaly or masses felt. Normal bowel sounds heard. Central nervous system: Alert and oriented. No focal neurological deficits. Musculoskeletal: No edema. No calf tenderness. Right leg brace Skin: No cyanosis. No rashes Psychiatry: Judgement and insight appear normal. Mood & affect appropriate.     Data Reviewed: I have personally reviewed following labs and imaging studies  CBC  Lab Results  Component Value Date   WBC 6.5 02/13/2021   RBC 2.83 (L) 02/13/2021   HGB 9.3 (L) 02/13/2021   HCT 27.2 (L) 02/13/2021   MCV 96.1 02/13/2021   MCH 32.9 02/13/2021   PLT 259 02/13/2021   MCHC 34.2 02/13/2021   RDW 13.8 02/13/2021   LYMPHSABS 0.8 02/06/2021   MONOABS 0.4  02/06/2021   EOSABS 0.0 02/06/2021   BASOSABS 0.0 Q000111Q     Last metabolic panel Lab Results  Component Value Date   NA 137 02/11/2021   K 4.1 02/11/2021   CL 103 02/11/2021   CO2 27 02/11/2021   BUN 15 02/11/2021   CREATININE 1.32 (H) 02/11/2021   GLUCOSE 150 (H) 02/11/2021   GFRNONAA 41 (L) 02/11/2021   GFRAA >60 08/09/2019   CALCIUM 8.2 (L) 02/11/2021   PHOS 3.3 02/08/2021   PROT 6.1 (L) 02/06/2021   ALBUMIN 3.6 02/06/2021   BILITOT 0.8 02/06/2021   ALKPHOS 92 02/06/2021   AST 16 02/06/2021   ALT 15 02/06/2021   ANIONGAP 7 02/11/2021    CBG (last 3)  Recent Labs    02/13/21 2046 02/14/21 0637 02/14/21 1141  GLUCAP 137* 147* 133*      GFR: Estimated Creatinine Clearance: 31.1 mL/min (A) (by C-G formula based on SCr of 1.32 mg/dL (H)).  Coagulation Profile: No results for input(s): INR, PROTIME in the last 168 hours.  Recent Results (from the past 240 hour(s))  Blood culture (routine x 2)     Status: None   Collection Time: 02/06/21  9:15 PM   Specimen: BLOOD LEFT HAND  Result Value Ref Range Status   Specimen Description BLOOD LEFT HAND  Final   Special Requests   Final    BOTTLES DRAWN AEROBIC AND ANAEROBIC Blood Culture results may not be optimal due to an inadequate volume of blood received in culture bottles   Culture   Final    NO GROWTH 5 DAYS Performed at [facility], [place] [address] [place], [place] [address]    Report Status 02/11/2021 FINAL  Final  Blood culture (routine x 2)     Status: None   Collection Time: 02/06/21  9:15 PM   Specimen: BLOOD RIGHT HAND  Result Value Ref Range Status   Specimen Description BLOOD RIGHT HAND  Final   Special Requests   Final    BOTTLES DRAWN AEROBIC AND ANAEROBIC Blood Culture results may not be optimal due to an inadequate volume of blood received in culture bottles   Culture   Final    NO GROWTH 5 DAYS Performed at [facility], [place] [address], [place], [place] [address]    Report  Status 02/11/2021 FINAL  Final  Resp Panel by RT-PCR (Flu A&B, Covid) Nasopharyngeal Swab     Status: None   Collection Time: 02/06/21 10:21 PM   Specimen: Nasopharyngeal Swab; Nasopharyngeal(NP) swabs in vial transport medium  Result Value Ref Range Status   SARS Coronavirus 2 by RT PCR NEGATIVE NEGATIVE Final    Comment: (NOTE) SARS-CoV-2 target nucleic acids are NOT DETECTED.  The SARS-CoV-2 RNA is generally detectable in upper respiratory specimens during the acute phase of infection. The lowest concentration of SARS-CoV-2 viral copies this assay can detect is 138 copies/mL. A negative result does not preclude SARS-Cov-2 infection and should not be used as the sole basis for treatment or other patient management decisions. A negative result may occur with  improper specimen collection/handling, submission of specimen  other than nasopharyngeal swab, presence of viral mutation(s) within the areas targeted by this assay, and inadequate number of viral copies(<138 copies/mL). A negative result must be combined with clinical observations, patient history, and epidemiological information. The expected result is Negative.  Fact Sheet for Patients:  EntrepreneurPulse.com.au  Fact Sheet for Healthcare Providers:  IncredibleEmployment.be  This test is no t yet approved or cleared by the [place] FDA and  has been authorized for detection and/or diagnosis of SARS-CoV-2 by FDA under an Emergency Use Authorization (EUA). This EUA will remain  in effect (meaning this test can be used) for the duration of the COVID-19 declaration under Section 564(b)(1) of the Act, 21 U.S.C.section 360bbb-3(b)(1), unless the authorization is terminated  or revoked sooner.       Influenza A by PCR NEGATIVE NEGATIVE Final   Influenza B by PCR NEGATIVE NEGATIVE Final    Comment: (NOTE) The Xpert Xpress SARS-CoV-2/FLU/RSV plus assay is intended as an aid in the  diagnosis of influenza from Nasopharyngeal swab specimens and should not be used as a sole basis for treatment. Nasal washings and aspirates are unacceptable for Xpert Xpress SARS-CoV-2/FLU/RSV testing.  Fact Sheet for Patients: EntrepreneurPulse.com.au  Fact Sheet for Healthcare Providers: IncredibleEmployment.be  This test is not yet approved or cleared by the [place] FDA and has been authorized for detection and/or diagnosis of SARS-CoV-2 by FDA under an Emergency Use Authorization (EUA). This EUA will remain in effect (meaning this test can be used) for the duration of the COVID-19 declaration under Section 564(b)(1) of the Act, 21 U.S.C. section 360bbb-3(b)(1), unless the authorization is terminated or revoked.  Performed at [facility], [place] [address] [place], [place] [address]          Radiology Studies: No results found.      Scheduled Meds:  atorvastatin  40 mg Oral QPM   azithromycin  500 mg Oral QHS   budesonide  0.5 mg Inhalation BID   cholecalciferol  1,000 Units Oral Daily   clopidogrel  75 mg Oral Daily   docusate sodium  100 mg Oral BID   insulin aspart  0-5 Units Subcutaneous QHS   insulin aspart  0-9 Units Subcutaneous TID WC   lidocaine-EPINEPHrine  10 mL Intradermal Once   montelukast  10 mg Oral Q1200   oxyCODONE  2.5 mg Oral Once   Continuous Infusions:  cefTRIAXone (ROCEPHIN)  IV 1 g (02/13/21 2049)     LOS: 8 days     Cordelia Poche, MD [facility] 02/14/2021, 1:12 PM  If 7PM-7AM, please contact night-coverage [email or web address]

## 2021-02-14 NOTE — Plan of Care (Signed)

## 2021-02-14 NOTE — TOC Progression Note (Addendum)
Transition of Care [facility]) - Progression Note    Patient Details  Name: Shun Faughnan MRN: [id number] Date of Birth: [birth year]  [facility] CM/SW Contact  Milinda Antis, [place] Phone Number: 02/14/2021, 2:30 PM  Clinical Narrative:    CSW spoke with the patient's daughter, Kellie Shropshire.  The family is now agreeable to SNF.  Bed offers were presented and the family agreed to [name].  CSW spoke with Narda Rutherford at [facility] the facility will have a bed available later this week.    Pending: bed availability and insurance auth.     16:15-  CSW received information from the facility that a bed will now be available tomorrow.  The facility is beginning insurance auth.    16:22-  CSW called the patient's daughter and provided an update and notified of the facilities request that patient's daughter be at the facility tomorrow morning at 11am to complete paperwork.  Daughter agreed.   Pending: insurance auth.   Expected Discharge Plan: [place] (vs SNF placement) Barriers to Discharge: Continued Medical Work up  Expected Discharge Plan and Services Expected Discharge Plan: [place] (vs SNF placement)                                               Social Determinants of Health (SDOH) Interventions    Readmission Risk Interventions No flowsheet data found.

## 2021-02-15 DIAGNOSIS — J189 Pneumonia, unspecified organism: Secondary | ICD-10-CM | POA: Diagnosis not present

## 2021-02-15 LAB — RESP PANEL BY RT-PCR (FLU A&B, COVID) ARPGX2
Influenza A by PCR: NEGATIVE
Influenza B by PCR: NEGATIVE
SARS Coronavirus 2 by RT PCR: NEGATIVE

## 2021-02-15 LAB — GLUCOSE, CAPILLARY
Glucose-Capillary: 124 mg/dL — ABNORMAL HIGH (ref 70–99)
Glucose-Capillary: 130 mg/dL — ABNORMAL HIGH (ref 70–99)
Glucose-Capillary: 118 mg/dL — ABNORMAL HIGH (ref 70–99)
Glucose-Capillary: 184 mg/dL — ABNORMAL HIGH (ref 70–99)

## 2021-02-15 NOTE — Progress Notes (Signed)
Pt was seen for mobility but declined OOB.  Her effort with LE ex was fairly good and discussed being up for next session.  Pt is still appropriate for rehab stay, due to NWB on R foot and her struggle to maintain her limitations to walk.  Encouraged her to be OOB to chair, and will follow up with standing efforts as she is able.    02/14/21 1500  PT Visit Information  Last PT Received On 02/14/21  Assistance Needed +2  History of Present Illness 80 y/o female was admitted after a fall in which she sustained a R trimalleolar fracture. Now s/p ORIF on 8/4. PMH including but not limited to  CVA, polyneuropathy, CKD4.  Subjective Data  Subjective declines to get OOB  Patient Stated Goal go home  Precautions  Precautions Fall  Precaution Comments prev struck her head  Required Braces or Orthoses Other Brace  Splint/Cast 02/07/21  Other Brace CAM boot on R LE  Restrictions  Weight Bearing Restrictions Yes  RLE Weight Bearing NWB  Pain Assessment  Pain Assessment Faces  Faces Pain Scale 2  Pain Location R ankle/foot  Pain Descriptors / Indicators Guarding  Pain Intervention(s) Monitored during session;Repositioned;Premedicated before session  Cognition  Arousal/Alertness Awake/alert  Behavior During Therapy WFL for tasks assessed/performed  Overall Cognitive Status Impaired/Different from baseline  Area of Impairment Awareness;Problem solving;Safety/judgement;Following commands  Following Commands Follows one step commands with increased time  Safety/Judgement Decreased awareness of deficits  Awareness Intellectual  Problem Solving Slow processing;Requires verbal cues;Requires tactile cues  Bed Mobility  Overal bed mobility Needs Assistance  General bed mobility comments mod assist to reposition on the bed  Transfers  General transfer comment declines to try with PT  General Comments  General comments (skin integrity, edema, etc.) pt is in bed with no support under RLE, declines to  have a pillow there to elevate  Exercises  Exercises General Lower Extremity  General Exercises - Lower Extremity  Ankle Circles/Pumps AROM;Left;5 reps  Quad Sets AROM;10 reps  Heel Slides AROM;AAROM;10 reps  Hip ABduction/ADduction AROM;AAROM;10 reps  Straight Leg Raises AROM;AAROM;10 reps  PT - End of Session  Activity Tolerance Patient limited by fatigue  Patient left in bed;with call bell/phone within reach;with bed alarm set  Nurse Communication Mobility status   PT - Assessment/Plan  PT Plan Current plan remains appropriate  PT Visit Diagnosis Muscle weakness (generalized) (M62.81);History of falling (Z91.81);Pain;Unsteadiness on feet (R26.81);Difficulty in walking, not elsewhere classified (R26.2)  Pain - Right/Left Right  Pain - part of body Ankle and joints of foot  PT Frequency (ACUTE ONLY) Min 3X/week  Follow Up Recommendations SNF  PT equipment None recommended by PT  AM-PAC PT "6 Clicks" Mobility Outcome Measure (Version 2)  Help needed turning from your back to your side while in a flat bed without using bedrails? 3  Help needed moving from lying on your back to sitting on the side of a flat bed without using bedrails? 3  Help needed moving to and from a bed to a chair (including a wheelchair)? 2  Help needed standing up from a chair using your arms (e.g., wheelchair or bedside chair)? 2  Help needed to walk in hospital room? 1  Help needed climbing 3-5 steps with a railing?  1  6 Click Score 12  Consider Recommendation of Discharge To: CIR/SNF/LTACH  Progressive Mobility  What is the highest level of mobility based on the progressive mobility assessment? Level 1 (Bedfast) - Unable to balance  while sitting on edge of bed  Mobility Sit up in bed/chair position for meals  PT Goal Progression  Progress towards PT goals Not progressing toward goals - comment  PT Time Calculation  PT Start Time (ACUTE ONLY) 1343  PT Stop Time (ACUTE ONLY) 1356  PT Time Calculation (min)  (ACUTE ONLY) 13 min  PT General Charges  $$ ACUTE PT VISIT 1 Visit  PT Treatments  $Therapeutic Exercise 8-22 mins    Mee Hives, PT MS Acute Rehab Dept. Number: [place] [facility] [place]

## 2021-02-15 NOTE — Plan of Care (Signed)

## 2021-02-15 NOTE — Progress Notes (Signed)
PROGRESS NOTE    Teresa Daniels  [id number] DOB: 02/25/1941 DOA: 02/06/2021 PCP: [name], PA-C   Brief Narrative: Teresa Daniels is a 80 y.o. female with a history of hypertension, diabetes mellitus type 2, CVA, CKD stage IV.  Patient presented secondary to general weakness and multiple falls and was found to have a left ankle fracture.  She was seen by orthopedic surgery who performed an ORIF.  Patient also found to have likely left lower lobe pneumonia and was started on antibiotics.  Subacute anemia likely secondary to fractures/perioperative blood loss which is now stabilized.  Plan for discharge to SNF when bed available.   Assessment & Plan:   Principal Problem:   CAP (community acquired pneumonia) Active Problems:   Diabetic polyneuropathy associated with type 2 diabetes mellitus ([place])   History of CVA (cerebrovascular accident)   CKD (chronic kidney disease) stage 4, GFR 15-29 ml/min (HCC)   Generalized weakness   Fracture dislocation of right ankle   Community acquired pneumonia Chest x-ray significant for left lung base opacity concerning for possible infection.  Patient started on empiric ceftriaxone and azithromycin. -Continue ceftriaxone IV and azithromycin  Acute anemia Perioperative blood loss Blood loss appears to be secondary to fracture in addition to perioperative blood loss.  Postop, globin has stabilized.  No evidence of hematoma. Transfused 1 unit of PRBC of 8/6. Rebound hemoglobin of 9.3  Diabetes mellitus, type 2 Polyneuropathy -Continue SSI  Hypokalemia Resolved with supplementation.  AKI on CKD stage IV Unclear baseline but about 1.4-1.5. Creatinine worsened to a peak of 2.13. no resolved.  Right triamalleolar ankle fracture/dislocation S/p ORIF. Nonweightbearing of right LE. Recommendation for Eliquis on discharge. Follow-up with orthopedic surgery in 2 weeks  History of CVA -Continue Plavix  Recent head laceration Two staples placed  in ED on 7/29. Staples removed on 8/8.   DVT prophylaxis: Plavix, SCDs Code Status:   Code Status: Full Code Family Communication: None at bedside Disposition Plan: Discharge to SNF.  Medically stable for discharge   Consultants:  Orthopedic surgery  Procedures:  ORIF (RIGHT ANKLE)  Antimicrobials: Ceftriaxone IV Azithromycin PO    Subjective: No concerns today. Heard she may be discharging to SNF today.  Objective: Vitals:   02/14/21 2045 02/14/21 2102 02/15/21 0747 02/15/21 0831  BP: (!) 145/69  (!) 143/79   Pulse: 77  67   Resp: 16     Temp: 98.5 F (36.9 C)  98.7 F (37.1 C)   TempSrc: Oral  Oral   SpO2: 98% 97% 100% 95%  Weight:      Height:        Intake/Output Summary (Last 24 hours) at 02/15/2021 1409 Last data filed at 02/15/2021 0900 Gross per 24 hour  Intake 120 ml  Output 900 ml  Net -780 ml    Filed Weights   02/06/21 1927  Weight: 68 kg    Examination:  General exam: Appears calm and comfortable HEENT: poor dentition Respiratory system: Clear to auscultation. Respiratory effort normal. Cardiovascular system: S1 & S2 heard, RRR. No murmurs, rubs, gallops or clicks. Gastrointestinal system: Abdomen is nondistended, soft and nontender. No organomegaly or masses felt. Normal bowel sounds heard. Central nervous system: Alert and oriented. No focal neurological deficits. Musculoskeletal: No edema. No calf tenderness Skin: No cyanosis. No rashes Psychiatry: Judgement and insight appear normal. Mood & affect appropriate.     Data Reviewed: I have personally reviewed following labs and imaging studies  CBC Lab Results  Component Value Date  WBC 6.5 02/13/2021   RBC 2.83 (L) 02/13/2021   HGB 9.3 (L) 02/13/2021   HCT 27.2 (L) 02/13/2021   MCV 96.1 02/13/2021   MCH 32.9 02/13/2021   PLT 259 02/13/2021   MCHC 34.2 02/13/2021   RDW 13.8 02/13/2021   LYMPHSABS 0.8 02/06/2021   MONOABS 0.4 02/06/2021   EOSABS 0.0 02/06/2021   BASOSABS 0.0  [id number]     Last metabolic panel Lab Results  Component Value Date   NA 137 02/11/2021   K 4.1 02/11/2021   CL 103 02/11/2021   CO2 27 02/11/2021   BUN 15 02/11/2021   CREATININE 1.32 (H) 02/11/2021   GLUCOSE 150 (H) 02/11/2021   GFRNONAA 41 (L) 02/11/2021   GFRAA >60 08/09/2019   CALCIUM 8.2 (L) 02/11/2021   PHOS 3.3 02/08/2021   PROT 6.1 (L) 02/06/2021   ALBUMIN 3.6 02/06/2021   BILITOT 0.8 02/06/2021   ALKPHOS 92 02/06/2021   AST 16 02/06/2021   ALT 15 02/06/2021   ANIONGAP 7 02/11/2021    CBG (last 3)  Recent Labs    02/14/21 2045 02/15/21 0634 02/15/21 1209  GLUCAP 126* 124* 130*      GFR: Estimated Creatinine Clearance: 31.1 mL/min (A) (by C-G formula based on SCr of 1.32 mg/dL (H)).  Coagulation Profile: No results for input(s): INR, PROTIME in the last 168 hours.  Recent Results (from the past 240 hour(s))  Blood culture (routine x 2)     Status: None   Collection Time: 02/06/21  9:15 PM   Specimen: BLOOD LEFT HAND  Result Value Ref Range Status   Specimen Description BLOOD LEFT HAND  Final   Special Requests   Final    BOTTLES DRAWN AEROBIC AND ANAEROBIC Blood Culture results may not be optimal due to an inadequate volume of blood received in culture bottles   Culture   Final    NO GROWTH 5 DAYS Performed at [facility], [place] [address], [place], [place] [address]    Report Status 02/11/2021 FINAL  Final  Blood culture (routine x 2)     Status: None   Collection Time: 02/06/21  9:15 PM   Specimen: BLOOD RIGHT HAND  Result Value Ref Range Status   Specimen Description BLOOD RIGHT HAND  Final   Special Requests   Final    BOTTLES DRAWN AEROBIC AND ANAEROBIC Blood Culture results may not be optimal due to an inadequate volume of blood received in culture bottles   Culture   Final    NO GROWTH 5 DAYS Performed at [facility], [place] [address], [place],  [address]    Report Status 02/11/2021 FINAL  Final  Resp Panel by  RT-PCR (Flu A&B, Covid) Nasopharyngeal Swab     Status: None   Collection Time: 02/06/21 10:21 PM   Specimen: Nasopharyngeal Swab; Nasopharyngeal(NP) swabs in vial transport medium  Result Value Ref Range Status   SARS Coronavirus 2 by RT PCR NEGATIVE NEGATIVE Final    Comment: (NOTE) SARS-CoV-2 target nucleic acids are NOT DETECTED.  The SARS-CoV-2 RNA is generally detectable in upper respiratory specimens during the acute phase of infection. The lowest concentration of SARS-CoV-2 viral copies this assay can detect is 138 copies/mL. A negative result does not preclude SARS-Cov-2 infection and should not be used as the sole basis for treatment or other patient management decisions. A negative result may occur with  improper specimen collection/handling, submission of specimen other than nasopharyngeal swab, presence of viral mutation(s)  within the areas targeted by this assay, and inadequate number of viral copies(<138 copies/mL). A negative result must be combined with clinical observations, patient history, and epidemiological information. The expected result is Negative.  Fact Sheet for Patients:  EntrepreneurPulse.com.au  Fact Sheet for Healthcare Providers:  IncredibleEmployment.be  This test is no t yet approved or cleared by the [place] FDA and  has been authorized for detection and/or diagnosis of SARS-CoV-2 by FDA under an Emergency Use Authorization (EUA). This EUA will remain  in effect (meaning this test can be used) for the duration of the COVID-19 declaration under Section 564(b)(1) of the Act, 21 U.S.C.section 360bbb-3(b)(1), unless the authorization is terminated  or revoked sooner.       Influenza A by PCR NEGATIVE NEGATIVE Final   Influenza B by PCR NEGATIVE NEGATIVE Final    Comment: (NOTE) The Xpert Xpress SARS-CoV-2/FLU/RSV plus assay is intended as an aid in the diagnosis of influenza from Nasopharyngeal swab  specimens and should not be used as a sole basis for treatment. Nasal washings and aspirates are unacceptable for Xpert Xpress SARS-CoV-2/FLU/RSV testing.  Fact Sheet for Patients: EntrepreneurPulse.com.au  Fact Sheet for Healthcare Providers: IncredibleEmployment.be  This test is not yet approved or cleared by the [place] FDA and has been authorized for detection and/or diagnosis of SARS-CoV-2 by FDA under an Emergency Use Authorization (EUA). This EUA will remain in effect (meaning this test can be used) for the duration of the COVID-19 declaration under Section 564(b)(1) of the Act, 21 U.S.C. section 360bbb-3(b)(1), unless the authorization is terminated or revoked.  Performed at [facility], [place] [address], [place], [place] [address]   Resp Panel by RT-PCR (Flu A&B, Covid) Nasopharyngeal Swab     Status: None   Collection Time: 02/15/21  9:36 AM   Specimen: Nasopharyngeal Swab; Nasopharyngeal(NP) swabs in vial transport medium  Result Value Ref Range Status   SARS Coronavirus 2 by RT PCR NEGATIVE NEGATIVE Final    Comment: (NOTE) SARS-CoV-2 target nucleic acids are NOT DETECTED.  The SARS-CoV-2 RNA is generally detectable in upper respiratory specimens during the acute phase of infection. The lowest concentration of SARS-CoV-2 viral copies this assay can detect is 138 copies/mL. A negative result does not preclude SARS-Cov-2 infection and should not be used as the sole basis for treatment or other patient management decisions. A negative result may occur with  improper specimen collection/handling, submission of specimen other than nasopharyngeal swab, presence of viral mutation(s) within the areas targeted by this assay, and inadequate number of viral copies(<138 copies/mL). A negative result must be combined with clinical observations, patient history, and epidemiological information. The expected result is  Negative.  Fact Sheet for Patients:  EntrepreneurPulse.com.au  Fact Sheet for Healthcare Providers:  IncredibleEmployment.be  This test is no t yet approved or cleared by the [place] FDA and  has been authorized for detection and/or diagnosis of SARS-CoV-2 by FDA under an Emergency Use Authorization (EUA). This EUA will remain  in effect (meaning this test can be used) for the duration of the COVID-19 declaration under Section 564(b)(1) of the Act, 21 U.S.C.section 360bbb-3(b)(1), unless the authorization is terminated  or revoked sooner.       Influenza A by PCR NEGATIVE NEGATIVE Final   Influenza B by PCR NEGATIVE NEGATIVE Final    Comment: (NOTE) The Xpert Xpress SARS-CoV-2/FLU/RSV plus assay is intended as an aid in the diagnosis of influenza from Nasopharyngeal swab specimens and should not be used as  a sole basis for treatment. Nasal washings and aspirates are unacceptable for Xpert Xpress SARS-CoV-2/FLU/RSV testing.  Fact Sheet for Patients: EntrepreneurPulse.com.au  Fact Sheet for Healthcare Providers: IncredibleEmployment.be  This test is not yet approved or cleared by the [place] FDA and has been authorized for detection and/or diagnosis of SARS-CoV-2 by FDA under an Emergency Use Authorization (EUA). This EUA will remain in effect (meaning this test can be used) for the duration of the COVID-19 declaration under Section 564(b)(1) of the Act, 21 U.S.C. section 360bbb-3(b)(1), unless the authorization is terminated or revoked.  Performed at [facility], [place] [address], [place], [place] [address]          Radiology Studies: No results found.      Scheduled Meds:  atorvastatin  40 mg Oral QPM   azithromycin  500 mg Oral QHS   budesonide  0.5 mg Inhalation BID   cholecalciferol  1,000 Units Oral Daily   clopidogrel  75 mg Oral Daily   docusate sodium  100 mg  Oral BID   insulin aspart  0-5 Units Subcutaneous QHS   insulin aspart  0-9 Units Subcutaneous TID WC   lidocaine-EPINEPHrine  10 mL Intradermal Once   montelukast  10 mg Oral Q1200   oxyCODONE  2.5 mg Oral Once   Continuous Infusions:  cefTRIAXone (ROCEPHIN)  IV 1 g (02/14/21 2117)     LOS: 9 days     Cordelia Poche, MD [facility] 02/15/2021, 2:09 PM  If 7PM-7AM, please contact night-coverage [email or web address]

## 2021-02-15 NOTE — Plan of Care (Signed)
  Problem: Education: Goal: Knowledge of General Education information will improve Description: Including pain rating scale, medication(s)/side effects and non-pharmacologic comfort measures Outcome: Adequate for Discharge   Problem: Health Behavior/Discharge Planning: Goal: Ability to manage health-related needs will improve Outcome: Adequate for Discharge   Problem: Clinical Measurements: Goal: Ability to maintain clinical measurements within normal limits will improve Outcome: Adequate for Discharge  Pt is doing well with mobility this night ,from recliner to bed using walker ,and I person assist

## 2021-02-15 NOTE — Progress Notes (Signed)
Occupational Therapy Treatment Patient Details Name: Teresa Daniels MRN: [id number] DOB: 1940/07/20 Today's Date: 02/15/2021    History of present illness 80 y/o female was admitted after a fall in which she sustained a R trimalleolar fracture. Now s/p ORIF on 8/4. PMH including but not limited to  CVA, polyneuropathy, CKD4.   OT comments  Pt making progress with functional goals. Session focused on bed mobility to sit EOB, sit- stand to RW, SPT to [facility] and to recliner, toiletng task, grooming and UB dressing. Pt continues to have difficulty maintaining NWB. Pt would continue to benefit from acute OT services to address impairments to maximize level of function and safety  Follow Up Recommendations  SNF    Equipment Recommendations  Other (comment) (TBD at SNF)    Recommendations for Other Services      Precautions / Restrictions Restrictions Weight Bearing Restrictions: Yes RLE Weight Bearing: Non weight bearing       Mobility Bed Mobility Overal bed mobility: Needs Assistance Bed Mobility: Supine to Sit     Supine to sit: Min assist;HOB elevated     General bed mobility comments: min A to elevate trunk, increased time and effort, cues to use rail    Transfers Overall transfer level: Needs assistance Equipment used: Rolling walker (2 wheeled) Transfers: Sit to/from Stand Sit to Stand: Mod assist Stand pivot transfers: Max assist       General transfer comment: pt continues to demo difficulty maintaining NWB    Balance Overall balance assessment: Needs assistance Sitting-balance support: Feet supported Sitting balance-Leahy Scale: Fair     Standing balance support: Bilateral upper extremity supported;During functional activity Standing balance-Leahy Scale: Poor                             ADL either performed or assessed with clinical judgement   ADL Overall ADL's : Needs assistance/impaired Eating/Feeding: Set up;Independent;Sitting   Grooming:  Wash/dry hands;Wash/dry face;Set up;Supervision/safety;Sitting           Upper Body Dressing : Set up;Supervision/safety;Sitting       Toilet Transfer: Moderate assistance;Maximal assistance;Cueing for safety;Cueing for sequencing;Stand-pivot;BSC   Toileting- Clothing Manipulation and Hygiene: Total assistance       Functional mobility during ADLs: Moderate assistance;Maximal assistance;Cueing for sequencing;Cueing for safety General ADL Comments: pt unable to maintain NWB     Vision Baseline Vision/History: Wears glasses Patient Visual Report: No change from baseline     Perception     Praxis      Cognition Arousal/Alertness: Awake/alert Behavior During Therapy: WFL for tasks assessed/performed Overall Cognitive Status: Impaired/Different from baseline Area of Impairment: Awareness;Problem solving;Safety/judgement;Following commands                       Following Commands: Follows one step commands with increased time Safety/Judgement: Decreased awareness of deficits   Problem Solving: Slow processing;Requires verbal cues;Requires tactile cues          Exercises     Shoulder Instructions       General Comments      Pertinent Vitals/ Pain       Pain Assessment: Faces Faces Pain Scale: Hurts a little bit Pain Location: R ankle/foot Pain Descriptors / Indicators: Guarding;Grimacing Pain Intervention(s): Monitored during session;Repositioned  Home Living  Prior Functioning/Environment              Frequency  Min 2X/week        Progress Toward Goals  OT Goals(current goals can now be found in the care plan section)  Progress towards OT goals: Progressing toward goals  Acute Rehab OT Goals Patient Stated Goal: go home  Plan Discharge plan remains appropriate    Co-evaluation                 AM-PAC OT "6 Clicks" Daily Activity     Outcome Measure   Help from  another person eating meals?: None Help from another person taking care of personal grooming?: A Little Help from another person toileting, which includes using toliet, bedpan, or urinal?: Total Help from another person bathing (including washing, rinsing, drying)?: A Lot Help from another person to put on and taking off regular upper body clothing?: A Little Help from another person to put on and taking off regular lower body clothing?: Total 6 Click Score: 14    End of Session Equipment Utilized During Treatment: Gait belt;Rolling walker;Other (comment) (BSC)  OT Visit Diagnosis: Unsteadiness on feet (R26.81);Other abnormalities of gait and mobility (R26.89);History of falling (Z91.81);Muscle weakness (generalized) (M62.81);Other symptoms and signs involving cognitive function;Pain Pain - Right/Left: Right Pain - part of body: Ankle and joints of foot;Leg   Activity Tolerance Patient tolerated treatment well   Patient Left in chair;with call bell/phone within reach;with chair alarm set   Nurse Communication Mobility status        Time: [id number] OT Time Calculation (min): 29 min  Charges: OT General Charges $OT Visit: 1 Visit OT Treatments $Self Care/Home Management : 8-22 mins $Therapeutic Activity: 8-22 mins     Britt Bottom 02/15/2021, 12:57 PM

## 2021-02-16 DIAGNOSIS — E1142 Type 2 diabetes mellitus with diabetic polyneuropathy: Secondary | ICD-10-CM | POA: Diagnosis not present

## 2021-02-16 DIAGNOSIS — N184 Chronic kidney disease, stage 4 (severe): Secondary | ICD-10-CM | POA: Diagnosis not present

## 2021-02-16 DIAGNOSIS — S82891A Other fracture of right lower leg, initial encounter for closed fracture: Secondary | ICD-10-CM | POA: Diagnosis not present

## 2021-02-16 DIAGNOSIS — J189 Pneumonia, unspecified organism: Secondary | ICD-10-CM | POA: Diagnosis not present

## 2021-02-16 LAB — GLUCOSE, CAPILLARY
Glucose-Capillary: 140 mg/dL — ABNORMAL HIGH (ref 70–99)
Glucose-Capillary: 134 mg/dL — ABNORMAL HIGH (ref 70–99)

## 2021-02-16 MED ORDER — ONDANSETRON HCL 4 MG PO TABS: 4.0000 mg | ORAL_TABLET | Freq: Four times a day (QID) | ORAL | 0 refills | Status: DC | PRN

## 2021-02-16 MED ORDER — LOSARTAN POTASSIUM 50 MG PO TABS
50.0000 mg | ORAL_TABLET | Freq: Every day | ORAL | Status: DC
  Administered 2021-02-16: 50 mg via ORAL
  Filled 2021-02-16: qty 1

## 2021-02-16 MED ORDER — HYDRALAZINE HCL 20 MG/ML IJ SOLN
10.0000 mg | Freq: Four times a day (QID) | INTRAMUSCULAR | Status: DC | PRN
  Administered 2021-02-16: 10 mg via INTRAVENOUS
  Filled 2021-02-16: qty 1

## 2021-02-16 MED ORDER — DOCUSATE SODIUM 100 MG PO CAPS: 100.0000 mg | ORAL_CAPSULE | Freq: Two times a day (BID) | ORAL | 0 refills | Status: DC

## 2021-02-16 MED ORDER — HYDRALAZINE HCL 10 MG PO TABS: 10.0000 mg | ORAL_TABLET | Freq: Four times a day (QID) | ORAL | Status: DC | PRN

## 2021-02-16 MED ORDER — POLYETHYLENE GLYCOL 3350 17 G PO PACK: 17.0000 g | PACK | Freq: Every day | ORAL | 0 refills | Status: DC | PRN

## 2021-02-16 NOTE — TOC Transition Note (Signed)
Transition of Care [facility]) - CM/SW Discharge Note   Patient Details  Name: Teresa Daniels MRN: [id number] Date of Birth: 12-Jul-1940  Transition of Care [facility]) CM/SW Contact:  Milinda Antis, [place] Phone Number: 02/16/2021, 11:57 AM   Clinical Narrative:     Patient will DC to: [name] Anticipated DC date: 02/16/2021 Family notified: Yes Transport by: Corey Harold   Per MD patient ready for DC to SNF. RN to call report prior to discharge [phone]. RN, patient, patient's family, and facility notified of DC. Discharge Summary and FL2 sent to facility. DC packet on chart. Ambulance transport requested for patient.   CSW will sign off for now as social work intervention is no longer needed. Please consult [place] again if new needs arise.    Final next level of care: Skilled Nursing Facility Barriers to Discharge: Barriers Resolved   Patient Goals and CMS Choice     Choice offered to / list presented to : Patient, Adult Children  Discharge Placement              Patient chooses bed at: [facility] Patient to be transferred to facility by: [place] Name of family member notified: Renee Rival (Daughter)   [phone] Patient and family notified of of transfer: 02/16/21  Discharge Plan and Services                                     Social Determinants of Health (SDOH) Interventions     Readmission Risk Interventions No flowsheet data found.

## 2021-02-16 NOTE — Discharge Summary (Signed)
Physician Discharge Summary  Columbia Barberena [id number] DOB: [birth year] DOA: 02/06/2021  PCP: [name], PA-C  Admit date: 02/06/2021 Discharge date: 02/16/2021  Admitted From: Home Disposition: SNF  Recommendations for Outpatient Follow-up:  Follow up with PCP in 1-2 weeks Please obtain CMP/CBC, Mag, Phos in one week Please follow up on the following pending results:  Home Health: No Equipment/Devices: None  Discharge Condition: Stable CODE STATUS: FULL CODE Diet recommendation:   Brief/Interim Summary: The patient is a 80 year old Caucasian female with a past medical history significant for but not limited to hypertension, diabetes mellitus type 2, history of CVA, history of chronic kidney disease stage IV as well as other comorbidities who presented secondary to general weakness and multiple falls and was found to have an acute left ankle fracture.  She was seen by orthopedic surgery who performed an ORIF.  She was also found to have a left lower lobe pneumonia and was started on antibiotics.  Secondary she developed a subacute anemia secondary to her fractures and perioperative blood loss which is now stabilized.  She is improved and has been stabilized for discharge and will need to follow-up with orthopedic surgery in outpatient setting.  Discharge Diagnoses:  Principal Problem:   CAP (community acquired pneumonia) Active Problems:   Diabetic polyneuropathy associated with type 2 diabetes mellitus ([place])   History of CVA (cerebrovascular accident)   CKD (chronic kidney disease) stage 4, GFR 15-29 ml/min (HCC)   Generalized weakness   Fracture dislocation of right ankle  Right trimalleolar ankle fracture/dislocation -Status post ORIF and open repair of right syndesmosis on 02/10/2021 -Nonweightbearing of the right lower extremity -Vitamin D level was 29.29 and was started on vitamin D3 supplementation that will be continued at discharge -Orthopedic surgery recommending  Eliquis on discharge and following up in 2 weeks -Pain control per Ortho  Community-Acquired Pneumonia -Chest x-ray was significant for the left lung base opacity concerning for possible infection -She was started on empiric antibiotics with IV ceftriaxone and azithromycin -She has completed 10 days of antibiotics while hospitalized and will now stop -She will will need to repeat chest x-ray in 3 to 6 weeks -Continued with albuterol inhaler and QVAR as well as montelukast  Acute anemia/perioperative blood loss -Blood loss appear to be secondary to fracture in addition to perioperative blood loss -Her postop hemoglobin has stabilized and there is no evidence of hematoma -She was transfused 1 unit PRBCs on 02/12/2021 and her hemoglobin has now improved -Last documented hemoglobin/hematocrit was 9.3/27.2 -Continue to monitor for signs symptoms of bleeding; currently no overt bleeding noted -repeat CBC within 1 week  Diabetes mellitus type 2 associated with polyneuropathy -Continue sliding scale insulin while hospitalized and resume home medications at discharge -CBGs ranging from 124-184  Hypokalemia -Resolved with supplementation -Last documented potassium level was 4.1  HTN -Resume Losartan but continue to Hold HCTZ -Continue to Monitor BP per Protocol  AKI on CKD stage IV -Unclear baseline but appears to be around 1.4-1.5 -Creatinine worsened to a peak of 2.13 -Now resolved and improved -Avoid nephrotoxic medications, contrast dyes, hypotension and renally adjust medication  History of CVA -Continue with Plavix and Atorvastatin 40 mg po qHS  Head laceration -Was recent and had 2 staples placed in the ED on 02/04/2021 and removed on 02/14/2021  Discharge Instructions   Allergies as of 02/16/2021   No Known Allergies      Medication List     STOP taking these medications    hydrochlorothiazide 25 MG tablet  Commonly known as: HYDRODIURIL   zinc sulfate 220 (50 Zn) MG  capsule       TAKE these medications    acetaminophen 325 MG tablet Commonly known as: TYLENOL Take 2 tablets (650 mg total) by mouth every 6 (six) hours as needed for mild pain (or Fever >/= 101).   albuterol 108 (90 Base) MCG/ACT inhaler Commonly known as: VENTOLIN HFA Inhale 2 puffs into the lungs every 6 (six) hours as needed for wheezing or shortness of breath.   apixaban 2.5 MG Tabs tablet Commonly known as: Eliquis Take 1 tablet (2.5 mg total) by mouth 2 (two) times daily.   atorvastatin 40 MG tablet Commonly known as: LIPITOR Take 40 mg by mouth at bedtime.   beclomethasone 40 MCG/ACT inhaler Commonly known as: QVAR Inhale 1 puff into the lungs 2 (two) times daily.   cetirizine 10 MG tablet Commonly known as: ZYRTEC Take 10 mg by mouth daily.   clopidogrel 75 MG tablet Commonly known as: PLAVIX Take 75 mg by mouth in the morning.   [employer] B12 Quick Dissolve 500 MCG Lozg Generic drug: Cyanocobalamin Take 500 mcg by mouth at bedtime.   docusate sodium 100 MG capsule Commonly known as: COLACE Take 1 capsule (100 mg total) by mouth 2 (two) times daily.   ferrous sulfate 325 (65 FE) MG tablet Take 325 mg by mouth daily after supper.   gabapentin 300 MG capsule Commonly known as: NEURONTIN Take 300 mg by mouth daily after supper.   glipiZIDE 2.5 MG 24 hr tablet Commonly known as: GLUCOTROL XL Take 2.5 mg by mouth in the morning.   KRILL & FISH OIL BLEND PO Take 1 capsule by mouth daily after breakfast.   losartan 50 MG tablet Commonly known as: COZAAR Take 50 mg by mouth daily.   MAGNESIUM PO Take 250 mg by mouth daily.   montelukast 10 MG tablet Commonly known as: SINGULAIR Take 10 mg by mouth daily after supper.   ondansetron 4 MG tablet Commonly known as: ZOFRAN Take 1 tablet (4 mg total) by mouth every 6 (six) hours as needed for nausea.   oxyCODONE 5 MG immediate release tablet Commonly known as: Oxy IR/ROXICODONE Take 1 tablet (5 mg  total) by mouth every 6 (six) hours as needed for moderate pain or severe pain.   pantoprazole 40 MG tablet Commonly known as: PROTONIX Take 40 mg by mouth daily before breakfast.   polyethylene glycol 17 g packet Commonly known as: MIRALAX / GLYCOLAX Take 17 g by mouth daily as needed for mild constipation.   sertraline 100 MG tablet Commonly known as: ZOLOFT Take 100 mg by mouth daily after supper.   VITAMIN D-3 PO Take 1 capsule by mouth daily after supper.               Discharge Care Instructions  (From admission, onward)           Start     Ordered   02/16/21 0000  Discharge wound care:       Comments: Per Ortho   02/16/21 1053            Follow-up Information     Haddix, Thomasene Lot, MD. Schedule an appointment as soon as possible for a visit in 2 week(s).   Specialty: Orthopedic Surgery Why: for repeat x-rays and wound check Contact information: [place] [address] [phone]  No Known Allergies  Consultations: Orthopedic Surgery  Procedures/Studies: DG Tibia/Fibula Right  Result Date: 02/06/2021 CLINICAL DATA:  Fall, right ankle pain, right ankle swelling and deformity EXAM: RIGHT TIBIA AND FIBULA - 2 VIEW COMPARISON:  None. FINDINGS: A trimalleolar fracture dislocation of the right ankle is seen with posterolateral dislocation of the talar dome in relation to the tibial plafond. Mildly comminuted distal fibular fracture demonstrates moderate lateral and mild posterior angulation and extends to the level of the tibial plafond. Transverse medial malleolar fracture demonstrates roughly 1 cm lateral displacement of the distal fracture fragment. Small posterior malleolar fracture fragment demonstrates 4 mm posterior and superior displacement. Extensive bimalleolar soft tissue swelling. IMPRESSION: Trimalleolar fracture dislocation of the right ankle as described above. Electronically Signed   By: Fidela Salisbury  MD   On: 02/06/2021 20:20   DG Ankle Complete Right  Result Date: 02/10/2021 CLINICAL DATA:  Elective surgery Z41.9 (ICD-10-CM) EXAM: DG C-ARM 1-60 MIN; RIGHT ANKLE - COMPLETE 3+ VIEW FLUOROSCOPY TIME:  Fluoroscopy Time:  42 seconds. Number of Acquired Spot Images: 5 COMPARISON:  02/06/2021. FINDINGS: Five C-arm fluoroscopic images were obtained intraoperatively and submitted for post operative interpretation. These images demonstrate plate and screw fixation of the distal fibular fracture with 2 screws crossing into the adjacent tibia. Also, two screw fixation of the medial malleolus fracture. Alignment is improved, near anatomic. Redemonstrated posterior malleolus fracture with mild displacement. Please see the performing provider's procedural report for further detail. IMPRESSION: Intraoperative fluoroscopy, as detailed above. Electronically Signed   By: Margaretha Sheffield MD   On: 02/10/2021 12:24   DG Ankle Complete Right  Result Date: 02/06/2021 CLINICAL DATA:  Trimalleolar fracture postreduction. EXAM: RIGHT ANKLE - COMPLETE 3+ VIEW COMPARISON:  Pre reduction radiographs earlier today. FINDINGS: Improved alignment of distal tibia and fibular fractures postreduction. There is decreased lateral subluxation of the talus and distal fracture fragments with respect to the tibial plafond. Generalized soft tissue edema. Overlying splint material in place. IMPRESSION: Improved alignment of distal tibia and fibular fractures postreduction with decreased lateral subluxation of the talus and distal fracture fragments with respect to the tibial plafond. Electronically Signed   By: Keith Rake M.D.   On: 02/06/2021 22:53   DG Ankle Complete Right  Result Date: 02/06/2021 CLINICAL DATA:  Right ankle pain following fall, right ankle swelling and deformity EXAM: RIGHT ANKLE - COMPLETE 3+ VIEW COMPARISON:  None. FINDINGS: A trimalleolar fracture dislocation of the right ankle is seen with posterolateral  dislocation of the talar dome in relation to the tibial plafond. Mildly comminuted distal fibular fracture demonstrates moderate lateral and mild posterior angulation and extends to the level of the tibial plafond. Transverse medial malleolar fracture demonstrates roughly 1 cm lateral displacement of the distal fracture fragment. Small posterior malleolar fracture fragment demonstrates 4 mm posterior and superior displacement. Extensive bimalleolar soft tissue swelling IMPRESSION: Trimalleolar fracture dislocation of the right ankle as described above. Electronically Signed   By: Fidela Salisbury MD   On: 02/06/2021 20:21   CT Head Wo Contrast  Result Date: 02/06/2021 CLINICAL DATA:  Head trauma. EXAM: CT HEAD WITHOUT CONTRAST TECHNIQUE: Contiguous axial images were obtained from the base of the skull through the vertex without intravenous contrast. COMPARISON:  Brain CT February 04, 2021. FINDINGS: Brain: Ventricles and sulci are appropriate for patient's age. No evidence for acute cortically based infarct, intracranial hemorrhage, mass lesion or mass-effect. Vascular: Unremarkable Skull: Intact. Sinuses/Orbits: Paranasal sinuses are well aerated. Mastoid air cells are unremarkable. Other:  None. IMPRESSION: No acute intracranial process. Electronically Signed   By: Lovey Newcomer M.D.   On: 02/06/2021 20:25   CT Head Wo Contrast  Result Date: 02/04/2021 CLINICAL DATA:  80 year old female status post fall striking head this morning. EXAM: CT HEAD WITHOUT CONTRAST TECHNIQUE: Contiguous axial images were obtained from the base of the skull through the vertex without intravenous contrast. COMPARISON:  Brain MRI 06/25/2020.  Head CT 06/07/2020. FINDINGS: Brain: Stable non contrast CT appearance of the brain. No midline shift, ventriculomegaly, mass effect, evidence of mass lesion, intracranial hemorrhage or evidence of cortically based acute infarction. Chronic lacunar infarct left corona radiata. Patchy additional white  matter hypodensity and basal ganglia heterogeneity. Vascular: Calcified atherosclerosis at the skull base. No suspicious intracranial vascular hyperdensity. Skull: Stable and intact. Sinuses/Orbits: Visualized paranasal sinuses and mastoids are stable and well aerated. Other: Mild posterior right vertex scalp hematoma or contusion on series 3, image 63. Underlying calvarium intact. No other orbit or scalp soft tissue injury. IMPRESSION: 1. Mild posterior right vertex scalp hematoma or contusion without underlying skull fracture. 2. 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 Electronically Signed   By: Genevie Ann M.D.   On: 02/04/2021 11:15   DG Chest Port 1 View  Result Date: 02/06/2021 CLINICAL DATA:  Chest pain. EXAM: PORTABLE CHEST 1 VIEW COMPARISON:  Chest radiograph 06/07/2020 FINDINGS: Stable cardiac and mediastinal contours. Similar coarse interstitial opacities. Minimal consolidative opacity left lung base no large area pulmonary consolidation. No pleural effusion or pneumothorax. IMPRESSION: Minimal consolidative opacity left lung base may represent atelectasis or infection. Recommend short-term follow-up chest radiograph to ensure resolution. Similar coarse interstitial opacities may be secondary to underlying chronic process or pulmonary edema. Atypical infection not excluded. Electronically Signed   By: Lovey Newcomer M.D.   On: 02/06/2021 20:21   DG Ankle Right Port  Result Date: 02/10/2021 CLINICAL DATA:  Fracture T14.8XXA (ICD-10-CM) Postop. EXAM: PORTABLE RIGHT ANKLE - 2 VIEW COMPARISON:  Same day fluoroscopy.  Radiographs from 02/06/2021. FINDINGS: Plate screw fixation of the distal fibular fracture with 2 screws crossing into the adjacent tibia. Also, 2 screw fixation of the medial malleolus. No evidence of immediate hardware complication. Alignment is improved relative to preoperative radiographs, now near anatomic. Posterior malleolus fracture better characterized on  the priors. No evidence of new/interval fracture. Expected postoperative swelling. IMPRESSION: ORIF of the lateral and medial malleolus fractures, as detailed above. Electronically Signed   By: Margaretha Sheffield MD   On: 02/10/2021 12:27   DG Swallowing Func-Speech Pathology  Result Date: 02/07/2021 Formatting of this result is different from the original. Objective Swallowing Evaluation: Type of Study: MBS-Modified Barium Swallow Study  Patient Details Name: Daisymae Brumett MRN: [id number] Date of Birth: [birth year] Today's Date: 02/07/2021 Time: SLP Start Time (ACUTE ONLY): 51 -SLP Stop Time (ACUTE ONLY): 1250 SLP Time Calculation (min) (ACUTE ONLY): 15 min Past Medical History: Past Medical History: Diagnosis Date  Asthma   Diabetes mellitus without complication ([place])   Hypertension   Stroke [facility])  Past Surgical History: Past Surgical History: Procedure Laterality Date  BREAST LUMPECTOMY Left   CESAREAN SECTION   HPI: Pt is a 80 y.o. female who presented for evaluation of generalized weakness and has had multiple falls at home over the last 24 hours.  Pt reports having a chronic cough which is unchanged. Chest xray (02/06/21) revealed "Minimal consolidative opacity left lung base may represent atelectasis or infection". Per RN note (02/07/21), "Pt noted to be  coughing upon swallowing meds. Pt verbalized having difficulty taking pills at home". SLP consulted at that time. PMH: HTN, DMT2, hx of CVA, CKD 4, EGD (08/03/20- [facility]).  No data recorded Assessment / Plan / Recommendation CHL IP CLINICAL IMPRESSIONS 02/07/2021 Clinical Impression Pt presents with oropharyngeal dysphagia marked by decreased bolus cohesion, premature spillage of liquids and intermittenlty reduced epiglottic inversion, resulting in aspiration of thin liquids via straw and with pill without sensation (PAS 8). Cued coughs did not completely expectorate aspirate. Small cup sips of thin revealed no notable penetration/aspiration. All solid POs also WFL  and min pharyngeal residuals observed via fluoro. Recommend dysphagia 3/thin liquid diet (no straws). Meds crushed in puree. SLP Visit Diagnosis Dysphagia, oropharyngeal phase (R13.12) Attention and concentration deficit following -- Frontal lobe and executive function deficit following -- Impact on safety and function Moderate aspiration risk   CHL IP TREATMENT RECOMMENDATION 02/07/2021 Treatment Recommendations Therapy as outlined in treatment plan below   Prognosis 02/07/2021 Prognosis for Safe Diet Advancement Fair Barriers to Reach Goals Time post onset Barriers/Prognosis Comment -- CHL IP DIET RECOMMENDATION 02/07/2021 SLP Diet Recommendations Dysphagia 3 (Mech soft) solids;Thin liquid Liquid Administration via Cup;No straw Medication Administration Crushed with puree Compensations Minimize environmental distractions;Slow rate;Small sips/bites;Clear throat intermittently Postural Changes Remain semi-upright after after feeds/meals (Comment)   CHL IP OTHER RECOMMENDATIONS 02/07/2021 Recommended Consults -- Oral Care Recommendations Oral care BID;Staff/trained caregiver to provide oral care Other Recommendations --   CHL IP FOLLOW UP RECOMMENDATIONS 02/07/2021 Follow up Recommendations Other (comment)   CHL IP FREQUENCY AND DURATION 02/07/2021 Speech Therapy Frequency (ACUTE ONLY) min 2x/week Treatment Duration 2 weeks      CHL IP ORAL PHASE 02/07/2021 Oral Phase Impaired Oral - Pudding Teaspoon -- Oral - Pudding Cup -- Oral - Honey Teaspoon -- Oral - Honey Cup -- Oral - Nectar Teaspoon -- Oral - Nectar Cup Decreased bolus cohesion;Premature spillage Oral - Nectar Straw -- Oral - Thin Teaspoon -- Oral - Thin Cup Decreased bolus cohesion;Premature spillage Oral - Thin Straw Decreased bolus cohesion;Premature spillage Oral - Puree WFL Oral - Mech Soft -- Oral - Regular WFL Oral - Multi-Consistency -- Oral - Pill WFL Oral Phase - Comment --  CHL IP PHARYNGEAL PHASE 02/07/2021 Pharyngeal Phase Impaired Pharyngeal- Pudding Teaspoon  -- Pharyngeal -- Pharyngeal- Pudding Cup -- Pharyngeal -- Pharyngeal- Honey Teaspoon -- Pharyngeal -- Pharyngeal- Honey Cup -- Pharyngeal -- Pharyngeal- Nectar Teaspoon -- Pharyngeal -- Pharyngeal- Nectar Cup [facility] Pharyngeal Material does not enter airway Pharyngeal- Nectar Straw NT Pharyngeal -- Pharyngeal- Thin Teaspoon NT Pharyngeal -- Pharyngeal- Thin Cup Reduced epiglottic inversion;Pharyngeal residue - valleculae;Compensatory strategies attempted (with notebox) Pharyngeal Material does not enter airway Pharyngeal- Thin Straw Penetration/Aspiration during swallow;Reduced epiglottic inversion;Pharyngeal residue - valleculae;Compensatory strategies attempted (with notebox);Moderate aspiration;Reduced airway/laryngeal closure Pharyngeal Material enters airway, passes BELOW cords and not ejected out despite cough attempt by patient Pharyngeal- Puree WFL Pharyngeal -- Pharyngeal- Mechanical Soft -- Pharyngeal -- Pharyngeal- Regular WFL Pharyngeal -- Pharyngeal- Multi-consistency -- Pharyngeal -- Pharyngeal- Pill Penetration/Aspiration before swallow;Moderate aspiration;Reduced airway/laryngeal closure Pharyngeal -- Pharyngeal Comment --  CHL IP CERVICAL ESOPHAGEAL PHASE 02/07/2021 Cervical Esophageal Phase WFL Pudding Teaspoon -- Pudding Cup -- Honey Teaspoon -- Honey Cup -- Nectar Teaspoon -- Nectar Cup -- Nectar Straw -- Thin Teaspoon -- Thin Cup -- Thin Straw -- Puree -- Mechanical Soft -- Regular -- Multi-consistency -- Pill -- Cervical Esophageal Comment -- Ellwood Dense, MA, CCC-SLP Acute Rehabilitation Services Office Number: [phone] Acie Fredrickson 02/07/2021, 3:00 PM  DG C-Arm 1-60 Min  Result Date: 02/10/2021 CLINICAL DATA:  Elective surgery Z41.9 (ICD-10-CM) EXAM: DG C-ARM 1-60 MIN; RIGHT ANKLE - COMPLETE 3+ VIEW FLUOROSCOPY TIME:  Fluoroscopy Time:  42 seconds. Number of Acquired Spot Images: 5 COMPARISON:  02/06/2021. FINDINGS: Five C-arm fluoroscopic images were obtained  intraoperatively and submitted for post operative interpretation. These images demonstrate plate and screw fixation of the distal fibular fracture with 2 screws crossing into the adjacent tibia. Also, two screw fixation of the medial malleolus fracture. Alignment is improved, near anatomic. Redemonstrated posterior malleolus fracture with mild displacement. Please see the performing provider's procedural report for further detail. IMPRESSION: Intraoperative fluoroscopy, as detailed above. Electronically Signed   By: Margaretha Sheffield MD   On: 02/10/2021 12:24     Subjective: Seen and examined at bedside and she is doing fairly well and ready to go stent.  No chest pain or shortness breath.  No lightheadedness or dizziness.  No other concerns or compliants at this time.  Discharge Exam: Vitals:   02/16/21 0729 02/16/21 0828  BP: (!) 165/90   Pulse: 69   Resp: 17   Temp: 97.7 F (36.5 C)   SpO2: 100% 98%   Vitals:   02/15/21 0831 02/15/21 2125 02/16/21 0729 02/16/21 0828  BP:  (!) 140/59 (!) 165/90   Pulse:  70 69   Resp:  18 17   Temp:  98.2 F (36.8 C) 97.7 F (36.5 C)   TempSrc:  Oral Oral   SpO2: 95% 96% 100% 98%  Weight:      Height:       General: Pt is alert, awake, not in acute distress Cardiovascular: RRR, S1/S2 +, no rubs, no gallops Respiratory: Diminished bilaterally, no wheezing, no rhonchi Abdominal: Soft, NT, ND, bowel sounds + Extremities: no edema, right ankle is immobilized;no cyanosis  The results of significant diagnostics from this hospitalization (including imaging, microbiology, ancillary and laboratory) are listed below for reference.    Microbiology: Recent Results (from the past 240 hour(s))  Blood culture (routine x 2)     Status: None   Collection Time: 02/06/21  9:15 PM   Specimen: BLOOD LEFT HAND  Result Value Ref Range Status   Specimen Description BLOOD LEFT HAND  Final   Special Requests   Final    BOTTLES DRAWN AEROBIC AND ANAEROBIC Blood  Culture results may not be optimal due to an inadequate volume of blood received in culture bottles   Culture   Final    NO GROWTH 5 DAYS Performed at [facility], [place] [address], [place], [place] [address]    Report Status 02/11/2021 FINAL  Final  Blood culture (routine x 2)     Status: None   Collection Time: 02/06/21  9:15 PM   Specimen: BLOOD RIGHT HAND  Result Value Ref Range Status   Specimen Description BLOOD RIGHT HAND  Final   Special Requests   Final    BOTTLES DRAWN AEROBIC AND ANAEROBIC Blood Culture results may not be optimal due to an inadequate volume of blood received in culture bottles   Culture   Final    NO GROWTH 5 DAYS Performed at [facility], [place] [address] [place], [place] [address]    Report Status 02/11/2021 FINAL  Final  Resp Panel by RT-PCR (Flu A&B, Covid) Nasopharyngeal Swab     Status: None   Collection Time: 02/06/21 10:21 PM   Specimen: Nasopharyngeal Swab; Nasopharyngeal(NP) swabs in vial transport medium  Result Value Ref Range Status   SARS Coronavirus 2 by RT PCR NEGATIVE NEGATIVE Final    Comment: (NOTE) SARS-CoV-2 target nucleic acids are NOT DETECTED.  The SARS-CoV-2 RNA is generally detectable in upper respiratory specimens during the acute phase of infection. The lowest concentration of SARS-CoV-2 viral copies this assay can detect is 138 copies/mL. A negative result does not preclude SARS-Cov-2 infection and should not be used as the sole basis for treatment or other patient management decisions. A negative result may occur with  improper specimen collection/handling, submission of specimen other than nasopharyngeal swab, presence of viral mutation(s) within the areas targeted by this assay, and inadequate number of viral copies(<138 copies/mL). A negative result must be combined with clinical observations, patient history, and epidemiological information. The expected result is Negative.  Fact Sheet for Patients:   EntrepreneurPulse.com.au  Fact Sheet for Healthcare Providers:  IncredibleEmployment.be  This test is no t yet approved or cleared by the [place] FDA and  has been authorized for detection and/or diagnosis of SARS-CoV-2 by FDA under an Emergency Use Authorization (EUA). This EUA will remain  in effect (meaning this test can be used) for the duration of the COVID-19 declaration under Section 564(b)(1) of the Act, 21 U.S.C.section 360bbb-3(b)(1), unless the authorization is terminated  or revoked sooner.       Influenza A by PCR NEGATIVE NEGATIVE Final   Influenza B by PCR NEGATIVE NEGATIVE Final    Comment: (NOTE) The Xpert Xpress SARS-CoV-2/FLU/RSV plus assay is intended as an aid in the diagnosis of influenza from Nasopharyngeal swab specimens and should not be used as a sole basis for treatment. Nasal washings and aspirates are unacceptable for Xpert Xpress SARS-CoV-2/FLU/RSV testing.  Fact Sheet for Patients: EntrepreneurPulse.com.au  Fact Sheet for Healthcare Providers: IncredibleEmployment.be  This test is not yet approved or cleared by the [place] FDA and has been authorized for detection and/or diagnosis of SARS-CoV-2 by FDA under an Emergency Use Authorization (EUA). This EUA will remain in effect (meaning this test can be used) for the duration of the COVID-19 declaration under Section 564(b)(1) of the Act, 21 U.S.C. section 360bbb-3(b)(1), unless the authorization is terminated or revoked.  Performed at [facility], [place] [address], [place], [place] [address]   Resp Panel by RT-PCR (Flu A&B, Covid) Nasopharyngeal Swab     Status: None   Collection Time: 02/15/21  9:36 AM   Specimen: Nasopharyngeal Swab; Nasopharyngeal(NP) swabs in vial transport medium  Result Value Ref Range Status   SARS Coronavirus 2 by RT PCR NEGATIVE NEGATIVE Final    Comment: (NOTE) SARS-CoV-2  target nucleic acids are NOT DETECTED.  The SARS-CoV-2 RNA is generally detectable in upper respiratory specimens during the acute phase of infection. The lowest concentration of SARS-CoV-2 viral copies this assay can detect is 138 copies/mL. A negative result does not preclude SARS-Cov-2 infection and should not be used as the sole basis for treatment or other patient management decisions. A negative result may occur with  improper specimen collection/handling, submission of specimen other than nasopharyngeal swab, presence of viral mutation(s) within the areas targeted by this assay, and inadequate number of viral copies(<138 copies/mL). A negative result must be combined with clinical observations, patient history, and epidemiological information. The expected result is Negative.  Fact Sheet for Patients:  EntrepreneurPulse.com.au  Fact Sheet for Healthcare Providers:  IncredibleEmployment.be  This test is no t yet approved or cleared by the [place] and  has been authorized for  detection and/or diagnosis of SARS-CoV-2 by FDA under an Emergency Use Authorization (EUA). This EUA will remain  in effect (meaning this test can be used) for the duration of the COVID-19 declaration under Section 564(b)(1) of the Act, 21 U.S.C.section 360bbb-3(b)(1), unless the authorization is terminated  or revoked sooner.       Influenza A by PCR NEGATIVE NEGATIVE Final   Influenza B by PCR NEGATIVE NEGATIVE Final    Comment: (NOTE) The Xpert Xpress SARS-CoV-2/FLU/RSV plus assay is intended as an aid in the diagnosis of influenza from Nasopharyngeal swab specimens and should not be used as a sole basis for treatment. Nasal washings and aspirates are unacceptable for Xpert Xpress SARS-CoV-2/FLU/RSV testing.  Fact Sheet for Patients: EntrepreneurPulse.com.au  Fact Sheet for Healthcare  Providers: IncredibleEmployment.be  This test is not yet approved or cleared by the [place] FDA and has been authorized for detection and/or diagnosis of SARS-CoV-2 by FDA under an Emergency Use Authorization (EUA). This EUA will remain in effect (meaning this test can be used) for the duration of the COVID-19 declaration under Section 564(b)(1) of the Act, 21 U.S.C. section 360bbb-3(b)(1), unless the authorization is terminated or revoked.  Performed at [facility], [place] [address] [place], [place] [address]     Labs: BNP (last 3 results) No results for input(s): BNP in the last 8760 hours. Basic Metabolic Panel: Recent Labs  Lab 02/10/21 1531 02/11/21 0252  NA  --  137  K  --  4.1  CL  --  103  CO2  --  27  GLUCOSE  --  150*  BUN  --  15  CREATININE 1.17* 1.32*  CALCIUM  --  8.2*  MG  --  1.5*   Liver Function Tests: No results for input(s): AST, ALT, ALKPHOS, BILITOT, PROT, ALBUMIN in the last 168 hours. No results for input(s): LIPASE, AMYLASE in the last 168 hours. No results for input(s): AMMONIA in the last 168 hours. CBC: Recent Labs  Lab 02/10/21 1531 02/11/21 0252 02/12/21 0209 02/13/21 1207  WBC 8.9 4.8 5.1 6.5  HGB 9.7* 7.0* 7.0* 9.3*  HCT 27.6* 20.9* 20.8* 27.2*  MCV 98.6 102.0* 100.5* 96.1  PLT 175 186 200 259   Cardiac Enzymes: No results for input(s): CKTOTAL, CKMB, CKMBINDEX, TROPONINI in the last 168 hours. BNP: Invalid input(s): POCBNP CBG: Recent Labs  Lab 02/15/21 0634 02/15/21 1209 02/15/21 1644 02/15/21 1935 02/16/21 0643  GLUCAP 124* 130* 118* 184* 140*   D-Dimer No results for input(s): DDIMER in the last 72 hours. Hgb A1c No results for input(s): HGBA1C in the last 72 hours. Lipid Profile No results for input(s): CHOL, HDL, LDLCALC, TRIG, CHOLHDL, LDLDIRECT in the last 72 hours. Thyroid function studies No results for input(s): TSH, T4TOTAL, T3FREE, THYROIDAB in the last 72  hours.  Invalid input(s): FREET3 Anemia work up No results for input(s): VITAMINB12, FOLATE, FERRITIN, TIBC, IRON, RETICCTPCT in the last 72 hours. Urinalysis    Component Value Date/Time   COLORURINE YELLOW 06/07/2020 1850   APPEARANCEUR CLEAR 06/07/2020 1850   LABSPEC 1.015 06/07/2020 1850   PHURINE 6.0 06/07/2020 1850   GLUCOSEU NEGATIVE 06/07/2020 1850   HGBUR NEGATIVE 06/07/2020 1850   BILIRUBINUR NEGATIVE 06/07/2020 1850   KETONESUR NEGATIVE 06/07/2020 1850   PROTEINUR NEGATIVE 06/07/2020 1850   NITRITE NEGATIVE 06/07/2020 1850   LEUKOCYTESUR NEGATIVE 06/07/2020 1850   Sepsis Labs Invalid input(s): PROCALCITONIN,  WBC,  LACTICIDVEN Microbiology Recent Results (from the past 240 hour(s))  Blood  culture (routine x 2)     Status: None   Collection Time: 02/06/21  9:15 PM   Specimen: BLOOD LEFT HAND  Result Value Ref Range Status   Specimen Description BLOOD LEFT HAND  Final   Special Requests   Final    BOTTLES DRAWN AEROBIC AND ANAEROBIC Blood Culture results may not be optimal due to an inadequate volume of blood received in culture bottles   Culture   Final    NO GROWTH 5 DAYS Performed at [facility], [place] [address] [place], [place] [address]    Report Status 02/11/2021 FINAL  Final  Blood culture (routine x 2)     Status: None   Collection Time: 02/06/21  9:15 PM   Specimen: BLOOD RIGHT HAND  Result Value Ref Range Status   Specimen Description BLOOD RIGHT HAND  Final   Special Requests   Final    BOTTLES DRAWN AEROBIC AND ANAEROBIC Blood Culture results may not be optimal due to an inadequate volume of blood received in culture bottles   Culture   Final    NO GROWTH 5 DAYS Performed at [facility], [place] [address], [place], [place] [address]    Report Status 02/11/2021 FINAL  Final  Resp Panel by RT-PCR (Flu A&B, Covid) Nasopharyngeal Swab     Status: None   Collection Time: 02/06/21 10:21 PM   Specimen: Nasopharyngeal Swab; Nasopharyngeal(NP)  swabs in vial transport medium  Result Value Ref Range Status   SARS Coronavirus 2 by RT PCR NEGATIVE NEGATIVE Final    Comment: (NOTE) SARS-CoV-2 target nucleic acids are NOT DETECTED.  The SARS-CoV-2 RNA is generally detectable in upper respiratory specimens during the acute phase of infection. The lowest concentration of SARS-CoV-2 viral copies this assay can detect is 138 copies/mL. A negative result does not preclude SARS-Cov-2 infection and should not be used as the sole basis for treatment or other patient management decisions. A negative result may occur with  improper specimen collection/handling, submission of specimen other than nasopharyngeal swab, presence of viral mutation(s) within the areas targeted by this assay, and inadequate number of viral copies(<138 copies/mL). A negative result must be combined with clinical observations, patient history, and epidemiological information. The expected result is Negative.  Fact Sheet for Patients:  EntrepreneurPulse.com.au  Fact Sheet for Healthcare Providers:  IncredibleEmployment.be  This test is no t yet approved or cleared by the [place] FDA and  has been authorized for detection and/or diagnosis of SARS-CoV-2 by FDA under an Emergency Use Authorization (EUA). This EUA will remain  in effect (meaning this test can be used) for the duration of the COVID-19 declaration under Section 564(b)(1) of the Act, 21 U.S.C.section 360bbb-3(b)(1), unless the authorization is terminated  or revoked sooner.       Influenza A by PCR NEGATIVE NEGATIVE Final   Influenza B by PCR NEGATIVE NEGATIVE Final    Comment: (NOTE) The Xpert Xpress SARS-CoV-2/FLU/RSV plus assay is intended as an aid in the diagnosis of influenza from Nasopharyngeal swab specimens and should not be used as a sole basis for treatment. Nasal washings and aspirates are unacceptable for Xpert Xpress  SARS-CoV-2/FLU/RSV testing.  Fact Sheet for Patients: EntrepreneurPulse.com.au  Fact Sheet for Healthcare Providers: IncredibleEmployment.be  This test is not yet approved or cleared by the [place] FDA and has been authorized for detection and/or diagnosis of SARS-CoV-2 by FDA under an Emergency Use Authorization (EUA). This EUA will remain in effect (meaning this test  can be used) for the duration of the COVID-19 declaration under Section 564(b)(1) of the Act, 21 U.S.C. section 360bbb-3(b)(1), unless the authorization is terminated or revoked.  Performed at [facility], [place] [address] [place], [place] [address]   Resp Panel by RT-PCR (Flu A&B, Covid) Nasopharyngeal Swab     Status: None   Collection Time: 02/15/21  9:36 AM   Specimen: Nasopharyngeal Swab; Nasopharyngeal(NP) swabs in vial transport medium  Result Value Ref Range Status   SARS Coronavirus 2 by RT PCR NEGATIVE NEGATIVE Final    Comment: (NOTE) SARS-CoV-2 target nucleic acids are NOT DETECTED.  The SARS-CoV-2 RNA is generally detectable in upper respiratory specimens during the acute phase of infection. The lowest concentration of SARS-CoV-2 viral copies this assay can detect is 138 copies/mL. A negative result does not preclude SARS-Cov-2 infection and should not be used as the sole basis for treatment or other patient management decisions. A negative result may occur with  improper specimen collection/handling, submission of specimen other than nasopharyngeal swab, presence of viral mutation(s) within the areas targeted by this assay, and inadequate number of viral copies(<138 copies/mL). A negative result must be combined with clinical observations, patient history, and epidemiological information. The expected result is Negative.  Fact Sheet for Patients:  EntrepreneurPulse.com.au  Fact Sheet for Healthcare Providers:   IncredibleEmployment.be  This test is no t yet approved or cleared by the [place] FDA and  has been authorized for detection and/or diagnosis of SARS-CoV-2 by FDA under an Emergency Use Authorization (EUA). This EUA will remain  in effect (meaning this test can be used) for the duration of the COVID-19 declaration under Section 564(b)(1) of the Act, 21 U.S.C.section 360bbb-3(b)(1), unless the authorization is terminated  or revoked sooner.       Influenza A by PCR NEGATIVE NEGATIVE Final   Influenza B by PCR NEGATIVE NEGATIVE Final    Comment: (NOTE) The Xpert Xpress SARS-CoV-2/FLU/RSV plus assay is intended as an aid in the diagnosis of influenza from Nasopharyngeal swab specimens and should not be used as a sole basis for treatment. Nasal washings and aspirates are unacceptable for Xpert Xpress SARS-CoV-2/FLU/RSV testing.  Fact Sheet for Patients: EntrepreneurPulse.com.au  Fact Sheet for Healthcare Providers: IncredibleEmployment.be  This test is not yet approved or cleared by the [place] FDA and has been authorized for detection and/or diagnosis of SARS-CoV-2 by FDA under an Emergency Use Authorization (EUA). This EUA will remain in effect (meaning this test can be used) for the duration of the COVID-19 declaration under Section 564(b)(1) of the Act, 21 U.S.C. section 360bbb-3(b)(1), unless the authorization is terminated or revoked.  Performed at [facility], [place] [address] [place], [place] [address]    Time coordinating discharge: 35 minutes  SIGNED:  Kerney Elbe, DO [facility] 02/16/2021, 10:54 AM Pager is on [place]  If 7PM-7AM, please contact night-coverage [email or web address]

## 2021-02-16 NOTE — Progress Notes (Signed)
Pt. Showed progress with mobility and pt pain level is lowered and controlled.   Luvenia Starch, RN

## 2021-02-16 NOTE — Plan of Care (Signed)

## 2021-02-16 NOTE — Progress Notes (Signed)
PT Cancellation Note  Patient Details Name: Teresa Daniels MRN: [id number] DOB: May 23, 1941   Cancelled Treatment:    Reason Eval/Treat Not Completed: Other (comment).  Pt was eating then declined therapy because she is leaving today for SNF.  Follow up if dc is delayed.   Ramond Dial 02/16/2021, 1:21 PM  Mee Hives, PT MS Acute Rehab Dept. Number: [place]

## 2021-03-10 ENCOUNTER — Other Ambulatory Visit: Payer: Medicare PPO

## 2021-04-04 ENCOUNTER — Ambulatory Visit (INDEPENDENT_AMBULATORY_CARE_PROVIDER_SITE_OTHER): Payer: Medicare PPO | Admitting: Neurology

## 2021-04-04 ENCOUNTER — Other Ambulatory Visit: Payer: Self-pay

## 2021-04-04 ENCOUNTER — Encounter: Payer: Self-pay | Admitting: Neurology

## 2021-04-04 VITALS — BP 123/67 | HR 76 | Ht 65.0 in | Wt 157.0 lb

## 2021-04-04 DIAGNOSIS — R2681 Unsteadiness on feet: Secondary | ICD-10-CM

## 2021-04-04 DIAGNOSIS — I639 Cerebral infarction, unspecified: Secondary | ICD-10-CM | POA: Diagnosis not present

## 2021-04-04 DIAGNOSIS — E1142 Type 2 diabetes mellitus with diabetic polyneuropathy: Secondary | ICD-10-CM | POA: Diagnosis not present

## 2021-04-04 NOTE — Progress Notes (Signed)
Follow-up Visit   Date: 04/04/21    Teresa Daniels MRN: [id number] DOB: 1940/07/14   Interim History: Teresa Daniels is a 80 y.o. 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  The patient was accompanied to the clinic by daughter who also provides collateral information.    History of present illness: In April 2019, patient presented to the ER with 2 day history of right sided weakness and falls. MRI showed left subcortical infarct. There was no evidence of large vessel occlusion. Surface echocardiogram did not show a cardioembolic source. She was previously taking aspirin and Zocor at home.  With her new stroke, she was started on dual antiplatelet therapy with aspirin 81 and Plavix 75 mg daily.  His she was discharged to [facility] where she was admitted from 4/30 through 11/22/2017.  She was discharged to rehab facility where she was in a wheelchair and gradually made some improvement of her right arm and leg strength.  Over the following months, strength returned to the right side, but she continues to have imbalance and falls.   She has long history of diabetic neuropathy of the feet and takes gabapentin 366m at bedtime.  UPDATE 07/12/2020:  During the month of November, she was having generalized fatigue, lack of appetite, falling, and overall feeling of being unwell.  She went to the ER and had evaluation by her PCP for infection and completed a course of antibiotics and PT/OT.  She still did not feel well and ultimately thought that symptoms were due to dehydration.  After starting Megace, her appetite improved and her fatigue has also improved.  On 12/17, she woke up with new onset right hand weakness, being unable to extend the fingers or wrist.  She went to the ER where MRI/A brain  did not show any acute findings. Over the past two weeks, her symptoms have improved.    UPDATE 09/30/2020:  She is here for follow-up visit. Right hand weakness has significantly improved with OT.  She almost feels that her hand is back to baseline.  She occasionally has some discomfort in the palm of the hand.  No tingling.  She was discharged from PT yesterday.  She is careful with walking and continues to have imbalance.  She is compliant with using her rollator.   UPDATE 04/04/2021:  She fractured her right ankle on 7/31 after falls while trying to stand up in the setting of being exhausted after her COVID booster.  She underwent ORIF, treatment for pneumonia, and had extended rehab stay and returned home on 9/9.  She is able to bear weight on her right leg now and walks with a rigid walker.   Patient does not recall any of the details of that day. She is in good spirits today, no new complaints.  Her right hand weakness has completely resolved.  Neuropathy is stable. Daughter says there was some confusion regarding patient's home medications, because she was taking plavix at home, but given eliquis at rehab.  She completed her eliquis and is back to taking plavix 771md.    Medications:  Current Outpatient Medications on File Prior to Visit  Medication Sig Dispense Refill   acetaminophen (TYLENOL) 325 MG tablet Take 2 tablets (650 mg total) by mouth every 6 (six) hours as needed for mild pain (or Fever >/= 101).  albuterol (VENTOLIN HFA) 108 (90 Base) MCG/ACT inhaler Inhale 2 puffs into the lungs every 6 (six) hours as needed for wheezing or shortness of breath.     atorvastatin (LIPITOR) 40 MG tablet Take 40 mg by mouth at bedtime.     beclomethasone (QVAR) 40 MCG/ACT inhaler Inhale 1 puff into the lungs 2 (two) times daily.     cetirizine (ZYRTEC) 10 MG tablet Take 10 mg by mouth daily.     Cholecalciferol (VITAMIN D-3 PO) Take 1 capsule by mouth daily after supper.     clopidogrel (PLAVIX)  75 MG tablet Take 75 mg by mouth in the morning.     Cyanocobalamin ([employer] B12 QUICK DISSOLVE) 500 MCG LOZG Take 500 mcg by mouth at bedtime.     docusate sodium (COLACE) 100 MG capsule Take 1 capsule (100 mg total) by mouth 2 (two) times daily. 10 capsule 0   ferrous sulfate 325 (65 FE) MG tablet Take 325 mg by mouth daily after supper.     Fish Oil-Krill Oil (KRILL & FISH OIL BLEND PO) Take 1 capsule by mouth daily after breakfast.     gabapentin (NEURONTIN) 300 MG capsule Take 300 mg by mouth daily after supper.     glipiZIDE (GLUCOTROL XL) 2.5 MG 24 hr tablet Take 2.5 mg by mouth in the morning.     losartan (COZAAR) 50 MG tablet Take 50 mg by mouth daily.     MAGNESIUM PO Take 250 mg by mouth daily.      montelukast (SINGULAIR) 10 MG tablet Take 10 mg by mouth daily after supper.     oxyCODONE (OXY IR/ROXICODONE) 5 MG immediate release tablet Take 1 tablet (5 mg total) by mouth every 6 (six) hours as needed for moderate pain or severe pain. 15 tablet 0   pantoprazole (PROTONIX) 40 MG tablet Take 40 mg by mouth daily before breakfast.     sertraline (ZOLOFT) 100 MG tablet Take 100 mg by mouth daily after supper.     apixaban (ELIQUIS) 2.5 MG TABS tablet Take 1 tablet (2.5 mg total) by mouth 2 (two) times daily. (Patient not taking: Reported on 04/04/2021) 60 tablet 0   No current facility-administered medications on file prior to visit.    Allergies: No Known Allergies  Vital Signs:  BP 123/67   Pulse 76   Ht 5' 5"  (1.651 m)   Wt 157 lb (71.2 kg)   SpO2 97%   BMI 26.13 kg/m   Neurological Exam: MENTAL STATUS including orientation to time, place, person, recent and remote memory, attention span and concentration, language, and fund of knowledge is normal.  Speech is not slightly hoarse, not dysarthric.  CRANIAL NERVES:   Pupils equal round and reactive to light.  Normal conjugate, extra-ocular eye movements in all directions of gaze.  No ptosis.   MOTOR:  Motor strength is 5/5  proximally throughout.  RLE is immobilized in a boot.  No atrophy, fasciculations or abnormal movements.  No pronator drift.  Tone is normal.    SENSORY: Vibration is absent distal to knees bilaterally.   Sensation is intact in the arms.   COORDINATION/GAIT:  Mild dysmetria with finger-to-nose testing on the right.  Gait is not tested, patient in a wheelchair today   Data: Labs 5/202019:  HbA1c 6.4 Labs 03/18/2018: TSH 1.130 Labs 04/05/2018 ESR 21, CRP, folate 14.9, vitamin B12 239, copper 95, vitamin E 6.6  Labs 06/11/2017: TSH 1.4, hemoglobin A1c 6.3, LDL 78, cholesterol 151 triglycerides 142 HDL  47, complete metabolic panel all within normal limits.   Diagnostic studies April 2019 TTE: EF is mildly reduced at 86-75%, grade 1 diastolic dysfunction. Right ventricle appears normal. Left atrium is mildly enlarged, right atrium is normal. Trivial mitral regurgitation and mild tricuspid regurgitation. CT angiogram head and neck: Mild intracranial atherosclerotic disease. Mild calcification and right carotid bulb, less than 50% severity. MRI brain: Acute nonhemorrhagic cerebrovascular accident along the left putamen and left corona radiate.  30-day cardiac monitor - normal  MRI lumbar spine 04/17/2018: Mild edema in the left sacrum, possibly due to recent fracture.  Correlate with pain in this area. MRI of the pelvis with attention to the sacrum could be performed for confirmation depending on the history and physical findings. Mild to moderate spinal stenosis L3-4 with mild subarticular stenosis bilaterally. Grade 1 anterolisthesis L4-5. Mild to moderate spinal stenosis with moderate subarticular stenosis on the left. Mild degenerative changes L5-S1 with mild subarticular stenosis bilaterally.  MRI brain wo contrast 06/25/2020: No acute finding by MRI. Chronic small-vessel ischemic changes throughout the brain as outlined above.  MRA head 06/25/2020: No intracranial large or medium vessel  occlusion or correctable proximal stenosis. Some atherosclerotic narrowing of the PCA branches, right more than left.   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  CT head 02/04/2021 viewed and shows scalp hematoma, no intracranial changes. Continue PT  3.  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   - Continue plavix 77mdaily and lipitor 4057maily, prescribed by PCP  - Unclear the indication for her eliquis while hospitalized and at rehab  4.  Right wrist drop - resolved.  Return to clinic in 1 year   Thank you for allowing me to participate in patient's care.  If I can answer any additional questions, I would be pleased to do so.    Sincerely,    Duel Conrad K. PatPosey ProntoO

## 2021-04-04 NOTE — Patient Instructions (Signed)
Return to clinic in 1 year.

## 2021-07-28 ENCOUNTER — Ambulatory Visit
Admission: RE | Admit: 2021-07-28 | Discharge: 2021-07-28 | Disposition: A | Discharge status: Home / Self Care | Payer: Medicare PPO | Source: Ambulatory Visit | Attending: Physician Assistant | Admitting: Physician Assistant

## 2021-07-28 DIAGNOSIS — E2839 Other primary ovarian failure: Secondary | ICD-10-CM

## 2021-08-25 ENCOUNTER — Other Ambulatory Visit: Payer: Medicare PPO

## 2021-12-29 ENCOUNTER — Other Ambulatory Visit: Payer: Self-pay | Admitting: Neurological Surgery

## 2022-01-04 ENCOUNTER — Telehealth: Payer: Self-pay

## 2022-01-04 NOTE — Telephone Encounter (Signed)
Called pt to give her answers to [facility]( the fax they sent to Dr. Posey Pronto). Her daughter asked if her PCP could do the same so she would not have to travel so far. I told her to call and see what they said and get back with [place] if needed.

## 2022-01-24 NOTE — Pre-Procedure Instructions (Signed)
Surgical Instructions    Your procedure is scheduled on Thursday, July 27th.  Report to [facility] Main Entrance "A" at 5:30 A.M., then check in with the Admitting office.  Call this number if you have problems the morning of surgery:  [phone]   If you have any questions prior to your surgery date call [phone]: Open Monday-Friday 8am-4pm    Remember:  Do not eat after midnight the night before your surgery  You may drink clear liquids until 4;30 a.m. the morning of your surgery.   Clear liquids allowed are: Water, Non-Citrus Juices (without pulp), Carbonated Beverages, Clear Tea, Black Coffee ONLY (NO MILK, CREAM OR POWDERED CREAMER of any kind), and Gatorade    Take these medicines the morning of surgery with A SIP OF WATER:  cetirizine (ZYRTEC)  pantoprazole (PROTONIX)   Take these medications as needed: acetaminophen (TYLENOL)  Albuterol inhaler-please bring inhaler with you to the hospital beclomethasone (QVAR)    Follow your surgeon's instructions on when to stop clopidogrel (PLAVIX).  If no instructions were given by your surgeon then you will need to call the office to get those instructions.    As of today, STOP taking any Aspirin (unless otherwise instructed by your surgeon) Aleve, Naproxen, Ibuprofen, Motrin, Advil, Goody's, BC's, all herbal medications, fish oil, and all vitamins. This includes: diclofenac Sodium (VOLTAREN) 1 % GEL.  WHAT DO I DO ABOUT MY DIABETES MEDICATION?   Do not take glipiZIDE (GLUCOTROL XL) the morning of surgery.   HOW TO MANAGE YOUR DIABETES BEFORE AND AFTER SURGERY  Why is it important to control my blood sugar before and after surgery? Improving blood sugar levels before and after surgery helps healing and can limit problems. A way of improving blood sugar control is eating a healthy diet by:  Eating less sugar and carbohydrates  Increasing activity/exercise  Talking with your doctor about reaching your blood sugar  goals High blood sugars (greater than 180 mg/dL) can raise your risk of infections and slow your recovery, so you will need to focus on controlling your diabetes during the weeks before surgery. Make sure that the doctor who takes care of your diabetes knows about your planned surgery including the date and location.  How do I manage my blood sugar before surgery? Check your blood sugar at least 4 times a day, starting 2 days before surgery, to make sure that the level is not too high or low.  Check your blood sugar the morning of your surgery when you wake up and every 2 hours until you get to the Short Stay unit.  If your blood sugar is less than 70 mg/dL, you will need to treat for low blood sugar: Do not take insulin. Treat a low blood sugar (less than 70 mg/dL) with  cup of clear juice (cranberry or apple), 4 glucose tablets, OR glucose gel. Recheck blood sugar in 15 minutes after treatment (to make sure it is greater than 70 mg/dL). If your blood sugar is not greater than 70 mg/dL on recheck, call [phone] for further instructions. Report your blood sugar to the short stay nurse when you get to Short Stay.  If you are admitted to the hospital after surgery: Your blood sugar will be checked by the staff and you will probably be given insulin after surgery (instead of oral diabetes medicines) to make sure you have good blood sugar levels. The goal for blood sugar control after surgery is 80-180 mg/dL.  Do not wear jewelry or makeup Do not wear lotions, powders, perfumes, or deodorant. Do not shave 48 hours prior to surgery.  Do not bring valuables to the hospital. Do not wear nail polish, gel polish, artificial nails, or any other type of covering on natural nails (fingers and toes) If you have artificial nails or gel coating that need to be removed by a nail salon, please have this removed prior to surgery. Artificial nails or gel coating may interfere with anesthesia's  ability to adequately monitor your vital signs.  [facility] is not responsible for any belongings or valuables. .   Do NOT Smoke (Tobacco/Vaping)  24 hours prior to your procedure  If you use a CPAP at night, you may bring your mask for your overnight stay.   Contacts, glasses, hearing aids, dentures or partials may not be worn into surgery, please bring cases for these belongings   For patients admitted to the hospital, discharge time will be determined by your treatment team.   Patients discharged the day of surgery will not be allowed to drive home, and someone needs to stay with them for 24 hours.   SURGICAL WAITING ROOM VISITATION Patients having surgery or a procedure may have no more than 2 support people in the waiting area - these visitors may rotate.   Children under the age of 71 must have an adult with them who is not the patient. If the patient needs to stay at the hospital during part of their recovery, the visitor guidelines for inpatient rooms apply. Pre-op nurse will coordinate an appropriate time for 1 support person to accompany patient in pre-op.  This support person may not rotate.   Please refer to the [facility] website for the visitor guidelines for Inpatients (after your surgery is over and you are in a regular room).    Special instructions:    Oral Hygiene is also important to reduce your risk of infection.  Remember - BRUSH YOUR TEETH THE MORNING OF SURGERY WITH YOUR REGULAR TOOTHPASTE   [place]- Preparing For Surgery  Before surgery, you can play an important role. Because skin is not sterile, your skin needs to be as free of germs as possible. You can reduce the number of germs on your skin by washing with CHG (chlorahexidine gluconate) Soap before surgery.  CHG is an antiseptic cleaner which kills germs and bonds with the skin to continue killing germs even after washing.     Please do not use if you have an allergy to CHG or antibacterial soaps.  If your skin becomes reddened/irritated stop using the CHG.  Do not shave (including legs and underarms) for at least 48 hours prior to first CHG shower. It is OK to shave your face.  Please follow these instructions carefully.     Shower the NIGHT BEFORE SURGERY and the MORNING OF SURGERY with CHG Soap.   If you chose to wash your hair, wash your hair first as usual with your normal shampoo. After you shampoo, rinse your hair and body thoroughly to remove the shampoo.  Then [employer] and genitals (private parts) with your normal soap and rinse thoroughly to remove soap.  After that Use CHG Soap as you would any other liquid soap. You can apply CHG directly to the skin and wash gently with a scrungie or a clean washcloth.   Apply the CHG Soap to your body ONLY FROM THE NECK DOWN.  Do not use on open wounds or open  sores. Avoid contact with your eyes, ears, mouth and genitals (private parts). Wash Face and genitals (private parts)  with your normal soap.   Wash thoroughly, paying special attention to the area where your surgery will be performed.  Thoroughly rinse your body with warm water from the neck down.  DO NOT shower/wash with your normal soap after using and rinsing off the CHG Soap.  Pat yourself dry with a CLEAN TOWEL.  Wear CLEAN PAJAMAS to bed the night before surgery  Place CLEAN SHEETS on your bed the night before your surgery  DO NOT SLEEP WITH PETS.   Day of Surgery:  Take a shower with CHG soap. Wear Clean/Comfortable clothing the morning of surgery Do not apply any deodorants/lotions.   Remember to brush your teeth WITH YOUR REGULAR TOOTHPASTE.    If you received a COVID test during your pre-op visit, it is requested that you wear a mask when out in public, stay away from anyone that may not be feeling well, and notify your surgeon if you develop symptoms. If you have been in contact with anyone that has tested positive in the last 10 days, please notify your  surgeon.    Please read over the following fact sheets that you were given.

## 2022-01-25 ENCOUNTER — Encounter (HOSPITAL_COMMUNITY)
Admission: RE | Admit: 2022-01-25 | Discharge: 2022-01-25 | Disposition: A | Discharge status: Home / Self Care | Payer: Medicare PPO | Source: Ambulatory Visit | Attending: Neurological Surgery | Admitting: Neurological Surgery

## 2022-01-25 ENCOUNTER — Other Ambulatory Visit: Payer: Self-pay

## 2022-01-25 ENCOUNTER — Encounter (HOSPITAL_COMMUNITY): Payer: Self-pay

## 2022-01-25 VITALS — BP 143/64 | HR 69 | Temp 98.4°F | Resp 18 | Ht 64.0 in | Wt 174.3 lb

## 2022-01-25 DIAGNOSIS — Z01818 Encounter for other preprocedural examination: Secondary | ICD-10-CM | POA: Insufficient documentation

## 2022-01-25 DIAGNOSIS — E119 Type 2 diabetes mellitus without complications: Secondary | ICD-10-CM | POA: Diagnosis not present

## 2022-01-25 HISTORY — DX: Chronic kidney disease, stage 4 (severe): N18.4

## 2022-01-25 LAB — SURGICAL PCR SCREEN
MRSA, PCR: NEGATIVE
Staphylococcus aureus: NEGATIVE

## 2022-01-25 LAB — CBC
HCT: 28.5 % — ABNORMAL LOW (ref 36.0–46.0)
Hemoglobin: 9.8 g/dL — ABNORMAL LOW (ref 12.0–15.0)
MCH: 35.5 pg — ABNORMAL HIGH (ref 26.0–34.0)
MCHC: 34.4 g/dL (ref 30.0–36.0)
MCV: 103.3 fL — ABNORMAL HIGH (ref 80.0–100.0)
Platelets: 177 10*3/uL (ref 150–400)
RBC: 2.76 MIL/uL — ABNORMAL LOW (ref 3.87–5.11)
RDW: 12.4 % (ref 11.5–15.5)
WBC: 5.4 10*3/uL (ref 4.0–10.5)
nRBC: 0 % (ref 0.0–0.2)

## 2022-01-25 LAB — BASIC METABOLIC PANEL
Anion gap: 8 (ref 5–15)
BUN: 34 mg/dL — ABNORMAL HIGH (ref 8–23)
CO2: 27 mmol/L (ref 22–32)
Calcium: 9.3 mg/dL (ref 8.9–10.3)
Chloride: 106 mmol/L (ref 98–111)
Creatinine, Ser: 1.69 mg/dL — ABNORMAL HIGH (ref 0.44–1.00)
GFR, Estimated: 30 mL/min — ABNORMAL LOW (ref >60)
Glucose, Bld: 156 mg/dL — ABNORMAL HIGH (ref 70–99)
Potassium: 4 mmol/L (ref 3.5–5.1)
Sodium: 141 mmol/L (ref 135–145)

## 2022-01-25 LAB — HEMOGLOBIN A1C
Hgb A1c MFr Bld: 7.2 % — ABNORMAL HIGH (ref 4.8–5.6)
Mean Plasma Glucose: 159.94 mg/dL

## 2022-01-25 LAB — GLUCOSE, CAPILLARY: Glucose-Capillary: 158 mg/dL — ABNORMAL HIGH (ref 70–99)

## 2022-01-25 NOTE — Progress Notes (Addendum)
PCP - [name], PA-C Cardiologist -  denies   PPM/ICD - denies Chest x-ray - N/A EKG - 01/25/2022  Stress Test - had chemical stress test "years ago"- normal per patient ECHO - denies Cardiac Cath - denies  Sleep Study - denies   Fasting Blood Sugar - 158 at PAT- patient checks daily- normally around 150 per patient. A1c done at PAT appointment   Blood Thinner Instructions: Follow your surgeon's instructions on when to stop Plavix.  If no instructions were given by your surgeon then you will need to call the office to get those instructions.  Hold for 5 days last dose on 01/27/22 per patient.  Aspirin Instructions: N/A  ERAS Protcol -ERAS with no drink   COVID TEST- N/A   Anesthesia review: review EKG, Creat 1.69, Hgb 9.8- patient states she does not think that she has a nephrologist.    Patient denies shortness of breath, fever, cough and chest pain at PAT appointment   All instructions explained to the patient, with a verbal understanding of the material. Patient agrees to go over the instructions while at home for a better understanding. Patient also instructed to self quarantine after being tested for COVID-19. The opportunity to ask questions was provided.

## 2022-01-26 ENCOUNTER — Encounter (HOSPITAL_COMMUNITY): Payer: Self-pay | Admitting: Emergency Medicine

## 2022-01-26 ENCOUNTER — Encounter (HOSPITAL_COMMUNITY): Payer: Self-pay

## 2022-01-26 NOTE — Progress Notes (Signed)
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  EF 45-50 in 2019 on echo. Pt has not ever seen cardiology. Does not appear LBBB was present in 2019 at time of echo for CVA. Discussed case with Dr. Glennon Mac. Pt will need cardiology eval prior to surgery. I notified Janett Billow in Dr. Colleen Can office.    VS: BP (!) 143/64   Pulse 69   Temp 36.9 C   Resp 18   Ht 5\' 4"  (1.626 m)   Wt 79.1 kg   SpO2 98%   BMI 29.92 kg/m   PROVIDERS: - PCP is [name], PA-C (notes in care everywhere)    LABS: Labs reviewed: Acceptable for surgery. - Cr 1.69 is consistent with recent prior results - Hgb 9.8 is improved from recent prior results  (all labs ordered are listed, but only abnormal results are displayed)  Labs Reviewed  GLUCOSE, CAPILLARY - Abnormal; Notable for the following components:      Result Value   Glucose-Capillary 158 (*)    All other components within normal limits  BASIC METABOLIC PANEL - Abnormal; Notable for the following components:   Glucose, Bld 156 (*)    BUN 34 (*)    Creatinine, Ser 1.69 (*)    GFR, Estimated 30 (*)    All other components within normal limits  CBC - Abnormal; Notable for the following components:   RBC 2.76 (*)    Hemoglobin 9.8 (*)    HCT 28.5 (*)    MCV 103.3 (*)    MCH 35.5 (*)    All  other components within normal limits  HEMOGLOBIN A1C - Abnormal; Notable for the following components:   Hgb A1c MFr Bld 7.2 (*)    All other components within normal limits  SURGICAL PCR SCREEN     IMAGES: CXR 04/08/21 (care everywhere): - Hyperinflated lungs and chronic interstitial changes consistent with COPD. No acute infiltrate or pleural effusion. The cardiomediastinal silhouette is normal in size and contour.  EKG 01/25/22: NSR. LBBB.  - LBBB is present on EKG since 2021   CV: Cardiac monitor 01/01/18:  - Sinus rhythm - No advanced AV block or pauses - No sustained arrhythmias  Echo 10/29/17 (found in [facility] Record 11/05/17 in media tab, pg 2-3): 1. Estimated EF mildly reduced at 45-50% 2. Grade 1 diastolic dysfunction 3. RV appears normal in size and function 4. LA mildly enlarged and RA is normal in size 5. Trivial MR and mild TR 6. Estimated PASP is 30-35 mmHg   Past Medical History:  Diagnosis Date   Asthma    CKD (chronic kidney disease) stage 4, GFR 15-29 ml/min (HCC)    Diabetes mellitus without complication (HCC)    Hypertension  Stroke [facility])    2019 per patient    Past Surgical History:  Procedure Laterality Date   BREAST LUMPECTOMY Left    CESAREAN SECTION     ORIF ANKLE FRACTURE Right 02/10/2021   Procedure: OPEN REDUCTION INTERNAL FIXATION (ORIF) ANKLE FRACTURE;  Surgeon: Shona Needles, MD;  Location: [facility];  Service: Orthopedics;  Laterality: Right;    MEDICATIONS:  acetaminophen (TYLENOL) 325 MG tablet   albuterol (VENTOLIN HFA) 108 (90 Base) MCG/ACT inhaler   apixaban (ELIQUIS) 2.5 MG TABS tablet   atorvastatin (LIPITOR) 40 MG tablet   beclomethasone (QVAR) 40 MCG/ACT inhaler   cetirizine (ZYRTEC) 10 MG tablet   Cholecalciferol (VITAMIN D-3) 25 MCG (1000 UT) CAPS   clopidogrel (PLAVIX) 75 MG tablet   diclofenac Sodium (VOLTAREN) 1 % GEL   docusate sodium (COLACE) 100 MG capsule   ENDOCET 2.5-325 MG tablet   ferrous  sulfate 325 (65 FE) MG tablet   Fish Oil-Krill Oil (KRILL & FISH OIL BLEND PO)   gabapentin (NEURONTIN) 300 MG capsule   glipiZIDE (GLUCOTROL XL) 2.5 MG 24 hr tablet   hydrochlorothiazide (HYDRODIURIL) 25 MG tablet   losartan (COZAAR) 50 MG tablet   MAGNESIUM PO   montelukast (SINGULAIR) 10 MG tablet   oxyCODONE (OXY IR/ROXICODONE) 5 MG immediate release tablet   pantoprazole (PROTONIX) 40 MG tablet   sertraline (ZOLOFT) 100 MG tablet   vitamin B-12 (CYANOCOBALAMIN) 1000 MCG tablet   No current facility-administered medications for this encounter.    Willeen Cass, PhD, FNP-BC [facility] Short Stay Surgical Center/Anesthesiology Phone: [phone] 01/27/2022 11:03 AM

## 2022-02-02 ENCOUNTER — Encounter (HOSPITAL_COMMUNITY): Admission: RE | Payer: Self-pay | Source: Home / Self Care

## 2022-02-02 ENCOUNTER — Ambulatory Visit (HOSPITAL_COMMUNITY): Admission: RE | Admit: 2022-02-02 | Payer: Medicare PPO | Source: Home / Self Care | Admitting: Neurological Surgery

## 2022-02-02 SURGERY — LUMBAR LAMINECTOMY/ DECOMPRESSION WITH MET-RX
Anesthesia: General

## 2022-02-09 NOTE — Progress Notes (Signed)
Cardiology Office Note   Date:  02/10/2022   ID:  Teresa Daniels, DOB [birth year], MRN [id number]  PCP:  [name], PA-C  Cardiologist:   Colisha Redler Martinique, MD   Chief Complaint  Patient presents with   Pre-op Exam      History of Present Illness: Teresa Daniels is a 82 y.o. female who is seen at the request of Dr Emelda Brothers with Neurosurgery for cardiac clearance for lumbar stenosis with neurogenic claudication. She has a history of HTN, HLD, CVA, CKD, and DM.   She reports she is limited with walking due to her back but is able to get around with a walker. She does have some chronic DOE. Chronic ankle swelling. She did have a DVT last year and took Eliquis for a time. Now back on Plavix. Had CVA over 2 years ago in [place]. ? If she had Echo at that time. Reports nuclear stress test over 10 years ago. No known history of CHF, CAD, angina, MI, arrhythmia.     Past Medical History:  Diagnosis Date   Asthma    CKD (chronic kidney disease) stage 4, GFR 15-29 ml/min (HCC)    Diabetes mellitus without complication ([place])    Hypertension    Stroke ([place])    2019 per patient    Past Surgical History:  Procedure Laterality Date   BREAST LUMPECTOMY Left    CESAREAN SECTION     ORIF ANKLE FRACTURE Right 02/10/2021   Procedure: OPEN REDUCTION INTERNAL FIXATION (ORIF) ANKLE FRACTURE;  Surgeon: Shona Needles, MD;  Location: [facility];  Service: Orthopedics;  Laterality: Right;     Current Outpatient Medications  Medication Sig Dispense Refill   acetaminophen (TYLENOL) 325 MG tablet Take 2 tablets (650 mg total) by mouth every 6 (six) hours as needed for mild pain (or Fever >/= 101).     albuterol (VENTOLIN HFA) 108 (90 Base) MCG/ACT inhaler Inhale 2 puffs into the lungs every 6 (six) hours as needed for wheezing or shortness of breath.     atorvastatin (LIPITOR) 40 MG tablet Take 40 mg by mouth at bedtime.     beclomethasone (QVAR) 40 MCG/ACT inhaler Inhale 1 puff into the lungs 2  (two) times daily as needed (shortness of breath).     cetirizine (ZYRTEC) 10 MG tablet Take 10 mg by mouth daily.     Cholecalciferol (VITAMIN D-3) 25 MCG (1000 UT) CAPS Take 1,000 Units by mouth daily after supper.     clopidogrel (PLAVIX) 75 MG tablet Take 75 mg by mouth in the morning.     diclofenac Sodium (VOLTAREN) 1 % GEL Apply 2 g topically daily as needed (pain).     ENDOCET 2.5-325 MG tablet Take 1 tablet by mouth at bedtime.     ferrous sulfate 325 (65 FE) MG tablet Take 325 mg by mouth daily after supper.     Fish Oil-Krill Oil (KRILL & FISH OIL BLEND PO) Take 1 capsule by mouth daily after breakfast.     gabapentin (NEURONTIN) 300 MG capsule Take 300 mg by mouth daily after supper.     glipiZIDE (GLUCOTROL XL) 2.5 MG 24 hr tablet Take 2.5 mg by mouth in the morning.     hydrochlorothiazide (HYDRODIURIL) 25 MG tablet Take 25 mg by mouth daily.     losartan (COZAAR) 50 MG tablet Take 50 mg by mouth daily.     MAGNESIUM PO Take 250 mg by mouth daily.      montelukast (SINGULAIR)  10 MG tablet Take 10 mg by mouth daily after supper.     pantoprazole (PROTONIX) 40 MG tablet Take 40 mg by mouth daily before breakfast.     sertraline (ZOLOFT) 100 MG tablet Take 100 mg by mouth daily after supper.     vitamin B-12 (CYANOCOBALAMIN) 1000 MCG tablet Take 1,000 mcg by mouth daily.     No current facility-administered medications for this visit.    Allergies:   Cat hair extract    Social History:  The patient  reports that she quit smoking about 54 years ago. Her smoking use included cigarettes. She has never used smokeless tobacco. She reports that she does not currently use alcohol. She reports that she does not use drugs.   Family History:  The patient's is negative for heart disease   ROS:  Please see the history of present illness.   Otherwise, review of systems are positive for none.   All other systems are reviewed and negative.    PHYSICAL EXAM: VS:  BP (!) 174/80   Pulse 77    Ht 5' 4.5" (1.638 m)   Wt 177 lb 9.6 oz (80.6 kg)   SpO2 97%   BMI 30.01 kg/m  , BMI Body mass index is 30.01 kg/m. GEN: Well nourished, well developed, in no acute distress HEENT: normal Neck: no JVD, carotid bruits, or masses Cardiac: RRR; no murmurs, rubs, or gallops,no edema  Respiratory:  clear to auscultation bilaterally, normal work of breathing GI: soft, nontender, nondistended, + BS MS: no deformity or atrophy Skin: warm and dry, no rash Neuro:  Strength and sensation are intact Psych: euthymic mood, full affect   EKG:  EKG is not ordered today. The ekg ordered 01/25/22 demonstrates NSR rate 78. LBBB chronic. I have personally reviewed and interpreted this study.    Recent Labs: 02/11/2021: Magnesium 1.5 01/25/2022: BUN 34; Creatinine, Ser 1.69; Hemoglobin 9.8; Platelets 177; Potassium 4.0; Sodium 141   Dated 10/04/21: cholesterol 183, triglycerides 225, HDL 45, LDL 100. A1c 7.2%. creatinine 1.71. GFR 30. Glucose 148. Otherwise CMET normal. Hgb 10.5.    Lipid Panel    Component Value Date/Time   TRIG 194 (H) 08/07/2019 2330      Wt Readings from Last 3 Encounters:  02/10/22 177 lb 9.6 oz (80.6 kg)  01/25/22 174 lb 4.8 oz (79.1 kg)  04/04/21 157 lb (71.2 kg)      Other studies Reviewed: Additional studies/ records that were reviewed today include:   Event monitor 01/01/18: Study Highlights  Sinus rhythm No advanced AV block or pauses No sustained arrhythmias   ASSESSMENT AND PLAN:  1.  Dyspnea. Chronic LBBB. For surgical clearance for lumbar surgery. Recommend Echo. If LV function OK will clear her to proceed with surgery. No active angina or history of MI 2. LBBB 3. DM 4. HTN 5. CKD stage 3b. Recommend avoidance of NSAIDs. May want to consider alternative antihypertensive to HCTZ since this may affect renal function 6. History of CVA.  7. HLD mixed. With history of CVA goal LDL < 70.    Current medicines are reviewed at length with the patient  today.  The patient does not have concerns regarding medicines.  The following changes have been made:  no change  Labs/ tests ordered today include:  Echo complete        Disposition:   if Echo is OK will clear for surgery  Signed, Danikah Budzik Martin[place], MD  02/10/2022 2:04 PM    [facility] [address] [place], [place], [address] Phone [phone], Fax [phone]

## 2022-02-10 ENCOUNTER — Ambulatory Visit (INDEPENDENT_AMBULATORY_CARE_PROVIDER_SITE_OTHER): Payer: Medicare PPO | Admitting: Cardiology

## 2022-02-10 ENCOUNTER — Encounter: Payer: Self-pay | Admitting: Cardiology

## 2022-02-10 VITALS — BP 174/80 | HR 77 | Ht 64.5 in | Wt 177.6 lb

## 2022-02-10 DIAGNOSIS — R0602 Shortness of breath: Secondary | ICD-10-CM

## 2022-02-10 DIAGNOSIS — N1832 Chronic kidney disease, stage 3b: Secondary | ICD-10-CM

## 2022-02-10 DIAGNOSIS — I10 Essential (primary) hypertension: Secondary | ICD-10-CM | POA: Diagnosis not present

## 2022-02-10 DIAGNOSIS — I447 Left bundle-branch block, unspecified: Secondary | ICD-10-CM

## 2022-02-10 DIAGNOSIS — E782 Mixed hyperlipidemia: Secondary | ICD-10-CM | POA: Diagnosis not present

## 2022-02-10 DIAGNOSIS — M48062 Spinal stenosis, lumbar region with neurogenic claudication: Secondary | ICD-10-CM

## 2022-02-10 NOTE — Addendum Note (Signed)
Addended by: Kathyrn Lass on: 02/10/2022 02:18 PM   Modules accepted: Orders

## 2022-02-10 NOTE — Patient Instructions (Signed)
Medication Instructions:  Continue same medications   Lab Work: None ordered   Testing/Procedures: Echo   Follow-Up: At [facility], you and your health needs are our priority.  As part of our continuing mission to provide you with exceptional heart care, we have created designated Provider Care Teams.  These Care Teams include your primary Cardiologist (physician) and Advanced Practice Providers (APPs -  Physician Assistants and Nurse Practitioners) who all work together to provide you with the care you need, when you need it.  We recommend signing up for the patient portal called "MyChart".  Sign up information is provided on this After Visit Summary.  MyChart is used to connect with patients for Virtual Visits (Telemedicine).  Patients are able to view lab/test results, encounter notes, upcoming appointments, etc.  Non-urgent messages can be sent to your provider as well.   To learn more about what you can do with MyChart, go to [email or web address].     Your next appointment:  To Be Determined after test    The format for your next appointment: Office   Provider:  Dr.Jordan   Important Information About Sugar

## 2022-02-21 ENCOUNTER — Ambulatory Visit (INDEPENDENT_AMBULATORY_CARE_PROVIDER_SITE_OTHER): Payer: Medicare PPO

## 2022-02-21 DIAGNOSIS — E782 Mixed hyperlipidemia: Secondary | ICD-10-CM | POA: Diagnosis not present

## 2022-02-21 DIAGNOSIS — I447 Left bundle-branch block, unspecified: Secondary | ICD-10-CM

## 2022-02-21 DIAGNOSIS — R0602 Shortness of breath: Secondary | ICD-10-CM

## 2022-02-21 DIAGNOSIS — I10 Essential (primary) hypertension: Secondary | ICD-10-CM

## 2022-02-21 DIAGNOSIS — M48062 Spinal stenosis, lumbar region with neurogenic claudication: Secondary | ICD-10-CM

## 2022-02-21 DIAGNOSIS — N1832 Chronic kidney disease, stage 3b: Secondary | ICD-10-CM

## 2022-02-21 LAB — ECHOCARDIOGRAM COMPLETE
Area-P 1/2: 3.65 cm2
S' Lateral: 2.35 cm

## 2022-02-24 ENCOUNTER — Other Ambulatory Visit: Payer: Self-pay | Admitting: Neurological Surgery

## 2022-03-08 ENCOUNTER — Encounter (HOSPITAL_COMMUNITY): Payer: Self-pay | Admitting: Neurological Surgery

## 2022-03-08 NOTE — Progress Notes (Signed)
PCP - [name], PA-C Cardiologist - Dr Peter Martinique Neurology - Narda Amber, DO  Chest x-ray - 02/06/21 (1V) EKG - 02/10/22 Stress Test - Reports stress test >10 yrs ago ECHO - 02/21/22 Cardiac Cath - n/a  ICD Pacemaker/Loop - n/a  Sleep Study -  n/a CPAP - none  Do not take Glipizide on the morning of surgery.  If your blood sugar is less than 70 mg/dL, you will need to treat for low blood sugar: Treat a low blood sugar (less than 70 mg/dL) with  cup of clear juice (cranberry or apple), 4 glucose tablets, OR glucose gel. Recheck blood sugar in 15 minutes after treatment (to make sure it is greater than 70 mg/dL). If your blood sugar is not greater than 70 mg/dL on recheck, call [phone] for further instructions.  Blood Thinner Instructions:  Follow your surgeon's instructions on when to stop Plavix prior to surgery.  Last dose was on 03/02/22.  ERAS: Clear liquids til 10:55 AM DOS  Anesthesia review: Yes  STOP now taking any Aspirin (unless otherwise instructed by your surgeon), Aleve, Naproxen, Ibuprofen, Motrin, Advil, Goody's, BC's, all herbal medications, fish oil, and all vitamins.   Coronavirus Screening Does the patient have any of the following symptoms:  Cough yes/no: No Fever (>100.61F)  yes/no: No Runny nose yes/no: No Sore throat yes/no: No Difficulty breathing/shortness of breath  yes/no: No  Have you traveled in the last 14 days and where? yes/no: No  Daughter Kellie Shropshire [phone] verbalized understanding of instructions that were given via phone.

## 2022-03-08 NOTE — Progress Notes (Signed)
Anesthesia Chart Review: Same day workup  History of HTN, HLD, CVA, chronic DOE, left bundle branch block.  Seen by cardiologist Dr. Peter Martinique 02/10/2022 to establish care as well as for preop eval.  Per note, "Dyspnea. Chronic LBBB. For surgical clearance for lumbar surgery. Recommend Echo. If LV function OK will clear her to proceed with surgery. No active angina or history of MI."  Echo 02/21/2022 showed EF 50 to 55%, grade 1 dd, no significant valvular abnormalities.  Dr. Martinique commented on results stating patient cleared to proceed with lumbar surgery.  Patient reports last dose Plavix 03/02/2022.  History of CKD 3  Non-insulin-dependent DM2, A1c 7.2 on 01/25/2022.  History of chronic anemia with baseline hemoglobin ~9-10.  Patient will need day of surgery labs and evaluation.  EKG 02/10/2022: NSR.  Rate 79.  Left bundle branch block.  TTE 02/21/2022:  1. Left ventricular ejection fraction, by estimation, is 50 to 55%. Left  ventricular ejection fraction by 3D volume is 50 %. The left ventricle has  low normal function. Difficult to assess wall motion due to incomplete  visualization of the LV  endocardium. Septal motion is abnormal in the setting of a LBBB. There is  mild concentric left ventricular hypertrophy. Left ventricular diastolic  parameters are consistent with Grade I diastolic dysfunction (impaired  relaxation).   2. Right ventricular systolic function is normal. The right ventricular  size is normal. There is normal pulmonary artery systolic pressure. The  estimated right ventricular systolic pressure is 41.6 mmHg.   3. The mitral valve is normal in structure. Trivial mitral valve  regurgitation.   4. The aortic valve is tricuspid. Aortic valve regurgitation is not  visualized. Aortic valve sclerosis is present, with no evidence of aortic  valve stenosis.   5. The inferior vena cava is normal in size with greater than 50%  respiratory variability, suggesting right  atrial pressure of 3 mmHg.     Teresa Daniels [facility]/Anesthesiology Phone [phone] 03/08/2022 10:42 AM

## 2022-03-08 NOTE — Anesthesia Preprocedure Evaluation (Signed)
Anesthesia Evaluation  Patient identified by MRN, date of birth, ID band Patient awake    Reviewed: Allergy & Precautions, NPO status , Patient's Chart, lab work & pertinent test results  History of Anesthesia Complications Negative for: history of anesthetic complications  Airway Mallampati: II  TM Distance: >3 FB Neck ROM: Full    Dental  (+) Missing, Dental Advisory Given, Chipped   Pulmonary asthma , COPD,  COPD inhaler, former smoker,    breath sounds clear to auscultation       Cardiovascular hypertension, Pt. on medications (-) angina Rhythm:Regular Rate:Normal  02/21/2022 ECHO:  EF 50-55%, low normal LVF, normal RVF, no significant valvular abnormalities   Neuro/Psych Depression Back pain CVA    GI/Hepatic Neg liver ROS, GERD  Medicated and Controlled,  Endo/Other  diabetes (glu 131), Oral Hypoglycemic Agents  Renal/GU Renal InsufficiencyRenal disease     Musculoskeletal   Abdominal   Peds  Hematology  (+) Blood dyscrasia (Hb 10.4), anemia ,   Anesthesia Other Findings   Reproductive/Obstetrics                           Anesthesia Physical Anesthesia Plan  ASA: 3  Anesthesia Plan: General   Post-op Pain Management: Tylenol PO (pre-op)*   Induction: Intravenous  PONV Risk Score and Plan: 3 and Ondansetron, Dexamethasone and Treatment may vary due to age or medical condition  Airway Management Planned: Oral ETT  Additional Equipment: None  Intra-op Plan:   Post-operative Plan: Extubation in OR  Informed Consent: I have reviewed the patients History and Physical, chart, labs and discussed the procedure including the risks, benefits and alternatives for the proposed anesthesia with the patient or authorized representative who has indicated his/her understanding and acceptance.     Dental advisory given  Plan Discussed with: CRNA and Surgeon  Anesthesia Plan Comments:  (PAT note by Karoline Caldwell, PA-C: History of HTN, HLD, CVA, chronic DOE, left bundle branch block.  Seen by cardiologist Dr. Peter Martinique 02/10/2022 to establish care as well as for preop eval.  Per note, "Dyspnea. Chronic LBBB. For surgical clearance for lumbar surgery. Recommend Echo. If LV function OK will clear her to proceed with surgery. No active angina or history of MI."  Echo 02/21/2022 showed EF 50 to 55%, grade 1 dd, no significant valvular abnormalities.  Dr. Martinique commented on results stating patient cleared to proceed with lumbar surgery.  Patient reports last dose Plavix 03/02/2022.  History of CKD 3  Non-insulin-dependent DM2, A1c 7.2 on 01/25/2022.  History of chronic anemia with baseline hemoglobin ~9-10.  Patient will need day of surgery labs and evaluation.  EKG 02/10/2022: NSR.  Rate 79.  Left bundle branch block.  TTE 02/21/2022: 1. Left ventricular ejection fraction, by estimation, is 50 to 55%. Left  ventricular ejection fraction by 3D volume is 50 %. The left ventricle has  low normal function. Difficult to assess wall motion due to incomplete  visualization of the LV  endocardium. Septal motion is abnormal in the setting of a LBBB. There is  mild concentric left ventricular hypertrophy. Left ventricular diastolic  parameters are consistent with Grade I diastolic dysfunction (impaired  relaxation).  2. Right ventricular systolic function is normal. The right ventricular  size is normal. There is normal pulmonary artery systolic pressure. The  estimated right ventricular systolic pressure is 95.6 mmHg.  3. The mitral valve is normal in structure. Trivial mitral valve  regurgitation.  4. The  aortic valve is tricuspid. Aortic valve regurgitation is not  visualized. Aortic valve sclerosis is present, with no evidence of aortic  valve stenosis.  5. The inferior vena cava is normal in size with greater than 50%  respiratory variability, suggesting right atrial pressure  of 3 mmHg.  )      Anesthesia Quick Evaluation

## 2022-03-09 ENCOUNTER — Other Ambulatory Visit: Payer: Self-pay

## 2022-03-09 ENCOUNTER — Ambulatory Visit (HOSPITAL_BASED_OUTPATIENT_CLINIC_OR_DEPARTMENT_OTHER): Payer: Medicare PPO | Admitting: Physician Assistant

## 2022-03-09 ENCOUNTER — Encounter (HOSPITAL_COMMUNITY): Payer: Self-pay | Admitting: Neurological Surgery

## 2022-03-09 ENCOUNTER — Ambulatory Visit (HOSPITAL_COMMUNITY): Payer: Medicare PPO

## 2022-03-09 ENCOUNTER — Ambulatory Visit (HOSPITAL_COMMUNITY): Payer: Medicare PPO | Admitting: Physician Assistant

## 2022-03-09 ENCOUNTER — Observation Stay (HOSPITAL_COMMUNITY)
Admission: RE | Admit: 2022-03-09 | Discharge: 2022-03-10 | Disposition: A | Discharge status: Home / Self Care | Payer: Medicare PPO | Source: Home / Self Care | Attending: Neurological Surgery | Admitting: Neurological Surgery

## 2022-03-09 ENCOUNTER — Encounter (HOSPITAL_COMMUNITY)
Admission: RE | Disposition: A | Discharge status: Home / Self Care | Payer: Self-pay | Source: Home / Self Care | Attending: Neurological Surgery

## 2022-03-09 DIAGNOSIS — I10 Essential (primary) hypertension: Secondary | ICD-10-CM | POA: Diagnosis not present

## 2022-03-09 DIAGNOSIS — Z87891 Personal history of nicotine dependence: Secondary | ICD-10-CM | POA: Insufficient documentation

## 2022-03-09 DIAGNOSIS — G9761 Postprocedural hematoma of a nervous system organ or structure following a nervous system procedure: Secondary | ICD-10-CM | POA: Diagnosis not present

## 2022-03-09 DIAGNOSIS — E1122 Type 2 diabetes mellitus with diabetic chronic kidney disease: Secondary | ICD-10-CM | POA: Insufficient documentation

## 2022-03-09 DIAGNOSIS — M48062 Spinal stenosis, lumbar region with neurogenic claudication: Secondary | ICD-10-CM

## 2022-03-09 DIAGNOSIS — M549 Dorsalgia, unspecified: Secondary | ICD-10-CM | POA: Diagnosis not present

## 2022-03-09 DIAGNOSIS — I129 Hypertensive chronic kidney disease with stage 1 through stage 4 chronic kidney disease, or unspecified chronic kidney disease: Secondary | ICD-10-CM | POA: Insufficient documentation

## 2022-03-09 DIAGNOSIS — N184 Chronic kidney disease, stage 4 (severe): Secondary | ICD-10-CM | POA: Insufficient documentation

## 2022-03-09 DIAGNOSIS — Z8673 Personal history of transient ischemic attack (TIA), and cerebral infarction without residual deficits: Secondary | ICD-10-CM | POA: Insufficient documentation

## 2022-03-09 DIAGNOSIS — J449 Chronic obstructive pulmonary disease, unspecified: Secondary | ICD-10-CM

## 2022-03-09 DIAGNOSIS — J45909 Unspecified asthma, uncomplicated: Secondary | ICD-10-CM | POA: Insufficient documentation

## 2022-03-09 HISTORY — DX: Depression, unspecified: F32.A

## 2022-03-09 HISTORY — PX: LUMBAR LAMINECTOMY/ DECOMPRESSION WITH MET-RX: SHX5959

## 2022-03-09 LAB — BASIC METABOLIC PANEL
Anion gap: 10 (ref 5–15)
BUN: 35 mg/dL — ABNORMAL HIGH (ref 8–23)
CO2: 27 mmol/L (ref 22–32)
Calcium: 9.1 mg/dL (ref 8.9–10.3)
Chloride: 104 mmol/L (ref 98–111)
Creatinine, Ser: 1.67 mg/dL — ABNORMAL HIGH (ref 0.44–1.00)
GFR, Estimated: 31 mL/min — ABNORMAL LOW (ref >60)
Glucose, Bld: 153 mg/dL — ABNORMAL HIGH (ref 70–99)
Potassium: 3.8 mmol/L (ref 3.5–5.1)
Sodium: 141 mmol/L (ref 135–145)

## 2022-03-09 LAB — CBC
HCT: 30.6 % — ABNORMAL LOW (ref 36.0–46.0)
Hemoglobin: 10.4 g/dL — ABNORMAL LOW (ref 12.0–15.0)
MCH: 34.8 pg — ABNORMAL HIGH (ref 26.0–34.0)
MCHC: 34 g/dL (ref 30.0–36.0)
MCV: 102.3 fL — ABNORMAL HIGH (ref 80.0–100.0)
Platelets: 198 10*3/uL (ref 150–400)
RBC: 2.99 MIL/uL — ABNORMAL LOW (ref 3.87–5.11)
RDW: 12.2 % (ref 11.5–15.5)
WBC: 7.1 10*3/uL (ref 4.0–10.5)
nRBC: 0 % (ref 0.0–0.2)

## 2022-03-09 LAB — GLUCOSE, CAPILLARY
Glucose-Capillary: 159 mg/dL — ABNORMAL HIGH (ref 70–99)
Glucose-Capillary: 131 mg/dL — ABNORMAL HIGH (ref 70–99)
Glucose-Capillary: 151 mg/dL — ABNORMAL HIGH (ref 70–99)
Glucose-Capillary: 256 mg/dL — ABNORMAL HIGH (ref 70–99)

## 2022-03-09 LAB — SURGICAL PCR SCREEN
MRSA, PCR: NEGATIVE
Staphylococcus aureus: POSITIVE — AB

## 2022-03-09 SURGERY — LUMBAR LAMINECTOMY/ DECOMPRESSION WITH MET-RX
Anesthesia: General

## 2022-03-09 MED ORDER — LIDOCAINE 2% (20 MG/ML) 5 ML SYRINGE
INTRAMUSCULAR | Status: DC | PRN
  Administered 2022-03-09: 40 mg via INTRAVENOUS

## 2022-03-09 MED ORDER — CHLORHEXIDINE GLUCONATE CLOTH 2 % EX PADS: 6.0000 | MEDICATED_PAD | Freq: Once | CUTANEOUS | Status: DC

## 2022-03-09 MED ORDER — INSULIN ASPART 100 UNIT/ML IJ SOLN: 0.0000 [IU] | INTRAMUSCULAR | Status: DC | PRN

## 2022-03-09 MED ORDER — OXYCODONE HCL 5 MG/5ML PO SOLN: 5.0000 mg | Freq: Once | ORAL | Status: DC | PRN

## 2022-03-09 MED ORDER — CHLORHEXIDINE GLUCONATE 0.12 % MT SOLN
OROMUCOSAL | Status: AC
  Administered 2022-03-09: 15 mL via OROMUCOSAL
  Filled 2022-03-09: qty 15

## 2022-03-09 MED ORDER — SODIUM CHLORIDE 0.9% FLUSH
3.0000 mL | INTRAVENOUS | Status: DC | PRN
  Administered 2022-03-09: 3 mL via INTRAVENOUS

## 2022-03-09 MED ORDER — LIDOCAINE-EPINEPHRINE 1 %-1:100000 IJ SOLN
INTRAMUSCULAR | Status: DC | PRN
  Administered 2022-03-09: 10 mL

## 2022-03-09 MED ORDER — ORAL CARE MOUTH RINSE: 15.0000 mL | Freq: Once | OROMUCOSAL | Status: AC

## 2022-03-09 MED ORDER — PROPOFOL 10 MG/ML IV BOLUS
INTRAVENOUS | Status: AC
  Filled 2022-03-09: qty 20

## 2022-03-09 MED ORDER — LIDOCAINE-EPINEPHRINE 1 %-1:100000 IJ SOLN
INTRAMUSCULAR | Status: AC
  Filled 2022-03-09: qty 1

## 2022-03-09 MED ORDER — CHLORHEXIDINE GLUCONATE 0.12 % MT SOLN
15.0000 mL | Freq: Once | OROMUCOSAL | Status: AC
Start: 2022-03-09 — End: 2022-03-09

## 2022-03-09 MED ORDER — THROMBIN 5000 UNITS EX SOLR
OROMUCOSAL | Status: DC | PRN
  Administered 2022-03-09: 5 mL via TOPICAL

## 2022-03-09 MED ORDER — GLIPIZIDE ER 2.5 MG PO TB24
2.5000 mg | ORAL_TABLET | Freq: Every morning | ORAL | Status: DC
  Administered 2022-03-10: 2.5 mg via ORAL
  Filled 2022-03-09: qty 1

## 2022-03-09 MED ORDER — ONDANSETRON HCL 4 MG/2ML IJ SOLN
INTRAMUSCULAR | Status: DC | PRN
  Administered 2022-03-09: 4 mg via INTRAVENOUS

## 2022-03-09 MED ORDER — HYDROMORPHONE HCL 1 MG/ML IJ SOLN: 1.0000 mg | INTRAMUSCULAR | Status: DC | PRN

## 2022-03-09 MED ORDER — DEXAMETHASONE SODIUM PHOSPHATE 10 MG/ML IJ SOLN
INTRAMUSCULAR | Status: AC
  Filled 2022-03-09: qty 1

## 2022-03-09 MED ORDER — THROMBIN 5000 UNITS EX SOLR
CUTANEOUS | Status: AC
  Filled 2022-03-09: qty 5000

## 2022-03-09 MED ORDER — OXYCODONE HCL 5 MG PO TABS
10.0000 mg | ORAL_TABLET | ORAL | Status: DC | PRN
  Administered 2022-03-09 – 2022-03-10 (×3): 10 mg via ORAL
  Filled 2022-03-09 (×3): qty 2

## 2022-03-09 MED ORDER — PANTOPRAZOLE SODIUM 40 MG PO TBEC
40.0000 mg | DELAYED_RELEASE_TABLET | Freq: Every day | ORAL | Status: DC
  Administered 2022-03-10: 40 mg via ORAL
  Filled 2022-03-09: qty 1

## 2022-03-09 MED ORDER — LIDOCAINE 2% (20 MG/ML) 5 ML SYRINGE
INTRAMUSCULAR | Status: AC
  Filled 2022-03-09: qty 5

## 2022-03-09 MED ORDER — ACETAMINOPHEN 650 MG RE SUPP: 650.0000 mg | RECTAL | Status: DC | PRN

## 2022-03-09 MED ORDER — DOCUSATE SODIUM 100 MG PO CAPS
100.0000 mg | ORAL_CAPSULE | Freq: Two times a day (BID) | ORAL | Status: DC
  Administered 2022-03-09 – 2022-03-10 (×2): 100 mg via ORAL
  Filled 2022-03-09 (×2): qty 1

## 2022-03-09 MED ORDER — PROPOFOL 10 MG/ML IV BOLUS
INTRAVENOUS | Status: DC | PRN
  Administered 2022-03-09: 100 mg via INTRAVENOUS

## 2022-03-09 MED ORDER — POLYETHYLENE GLYCOL 3350 17 G PO PACK: 17.0000 g | PACK | Freq: Every day | ORAL | Status: DC | PRN

## 2022-03-09 MED ORDER — FENTANYL CITRATE (PF) 100 MCG/2ML IJ SOLN: 25.0000 ug | INTRAMUSCULAR | Status: DC | PRN

## 2022-03-09 MED ORDER — SERTRALINE HCL 50 MG PO TABS: 100.0000 mg | ORAL_TABLET | Freq: Every day | ORAL | Status: DC

## 2022-03-09 MED ORDER — ONDANSETRON HCL 4 MG/2ML IJ SOLN
4.0000 mg | Freq: Four times a day (QID) | INTRAMUSCULAR | Status: DC | PRN
Start: 2022-03-09 — End: 2022-03-10

## 2022-03-09 MED ORDER — CEFAZOLIN SODIUM-DEXTROSE 2-4 GM/100ML-% IV SOLN
2.0000 g | Freq: Three times a day (TID) | INTRAVENOUS | Status: AC
  Administered 2022-03-09 – 2022-03-10 (×2): 2 g via INTRAVENOUS
  Filled 2022-03-09 (×2): qty 100

## 2022-03-09 MED ORDER — ALBUTEROL SULFATE (2.5 MG/3ML) 0.083% IN NEBU
3.0000 mL | INHALATION_SOLUTION | Freq: Four times a day (QID) | RESPIRATORY_TRACT | Status: DC | PRN
Start: 2022-03-09 — End: 2022-03-10

## 2022-03-09 MED ORDER — PHENOL 1.4 % MT LIQD: 1.0000 | OROMUCOSAL | Status: DC | PRN

## 2022-03-09 MED ORDER — OXYCODONE HCL 5 MG PO TABS: 5.0000 mg | ORAL_TABLET | Freq: Once | ORAL | Status: DC | PRN

## 2022-03-09 MED ORDER — CEFAZOLIN SODIUM-DEXTROSE 2-4 GM/100ML-% IV SOLN
2.0000 g | INTRAVENOUS | Status: AC
  Administered 2022-03-09: 2 g via INTRAVENOUS

## 2022-03-09 MED ORDER — LABETALOL HCL 5 MG/ML IV SOLN
5.0000 mg | INTRAVENOUS | Status: DC | PRN
  Administered 2022-03-09: 5 mg via INTRAVENOUS

## 2022-03-09 MED ORDER — CEFAZOLIN SODIUM-DEXTROSE 2-4 GM/100ML-% IV SOLN
INTRAVENOUS | Status: AC
  Filled 2022-03-09: qty 100

## 2022-03-09 MED ORDER — ROCURONIUM BROMIDE 10 MG/ML (PF) SYRINGE
PREFILLED_SYRINGE | INTRAVENOUS | Status: AC
  Filled 2022-03-09: qty 10

## 2022-03-09 MED ORDER — FENTANYL CITRATE (PF) 250 MCG/5ML IJ SOLN
INTRAMUSCULAR | Status: AC
  Filled 2022-03-09: qty 5

## 2022-03-09 MED ORDER — LABETALOL HCL 5 MG/ML IV SOLN: 5.0000 mg | Freq: Once | INTRAVENOUS | Status: DC

## 2022-03-09 MED ORDER — MENTHOL 3 MG MT LOZG: 1.0000 | LOZENGE | OROMUCOSAL | Status: DC | PRN

## 2022-03-09 MED ORDER — LOSARTAN POTASSIUM 50 MG PO TABS
50.0000 mg | ORAL_TABLET | Freq: Every day | ORAL | Status: DC
  Administered 2022-03-09 – 2022-03-10 (×2): 50 mg via ORAL
  Filled 2022-03-09 (×2): qty 1

## 2022-03-09 MED ORDER — LACTATED RINGERS IV SOLN: INTRAVENOUS | Status: DC

## 2022-03-09 MED ORDER — MEPERIDINE HCL 25 MG/ML IJ SOLN: 6.2500 mg | INTRAMUSCULAR | Status: DC | PRN

## 2022-03-09 MED ORDER — OXYCODONE HCL 5 MG PO TABS: 5.0000 mg | ORAL_TABLET | ORAL | Status: DC | PRN

## 2022-03-09 MED ORDER — ACETAMINOPHEN 325 MG PO TABS: 650.0000 mg | ORAL_TABLET | ORAL | Status: DC | PRN

## 2022-03-09 MED ORDER — FENTANYL CITRATE (PF) 100 MCG/2ML IJ SOLN
INTRAMUSCULAR | Status: DC | PRN
  Administered 2022-03-09: 50 ug via INTRAVENOUS
  Administered 2022-03-09: 25 ug via INTRAVENOUS

## 2022-03-09 MED ORDER — MIDAZOLAM HCL 2 MG/2ML IJ SOLN: 0.5000 mg | Freq: Once | INTRAMUSCULAR | Status: DC | PRN

## 2022-03-09 MED ORDER — SUGAMMADEX SODIUM 200 MG/2ML IV SOLN
INTRAVENOUS | Status: DC | PRN
  Administered 2022-03-09: 200 mg via INTRAVENOUS

## 2022-03-09 MED ORDER — SODIUM CHLORIDE 0.9% FLUSH
3.0000 mL | Freq: Two times a day (BID) | INTRAVENOUS | Status: DC
  Administered 2022-03-09: 3 mL via INTRAVENOUS

## 2022-03-09 MED ORDER — ONDANSETRON HCL 4 MG PO TABS: 4.0000 mg | ORAL_TABLET | Freq: Four times a day (QID) | ORAL | Status: DC | PRN

## 2022-03-09 MED ORDER — CYCLOBENZAPRINE HCL 10 MG PO TABS
10.0000 mg | ORAL_TABLET | Freq: Three times a day (TID) | ORAL | Status: DC | PRN
  Administered 2022-03-09 – 2022-03-10 (×2): 10 mg via ORAL
  Filled 2022-03-09 (×2): qty 1

## 2022-03-09 MED ORDER — ACETAMINOPHEN 500 MG PO TABS: 1000.0000 mg | ORAL_TABLET | Freq: Once | ORAL | Status: DC

## 2022-03-09 MED ORDER — LABETALOL HCL 5 MG/ML IV SOLN
INTRAVENOUS | Status: AC
  Filled 2022-03-09: qty 4

## 2022-03-09 MED ORDER — MONTELUKAST SODIUM 10 MG PO TABS: 10.0000 mg | ORAL_TABLET | Freq: Every day | ORAL | Status: DC

## 2022-03-09 MED ORDER — ATORVASTATIN CALCIUM 40 MG PO TABS
40.0000 mg | ORAL_TABLET | Freq: Every day | ORAL | Status: DC
  Administered 2022-03-09: 40 mg via ORAL
  Filled 2022-03-09: qty 1

## 2022-03-09 MED ORDER — PROMETHAZINE HCL 25 MG/ML IJ SOLN: 6.2500 mg | INTRAMUSCULAR | Status: DC | PRN

## 2022-03-09 MED ORDER — ONDANSETRON HCL 4 MG/2ML IJ SOLN
INTRAMUSCULAR | Status: AC
  Filled 2022-03-09: qty 2

## 2022-03-09 MED ORDER — SODIUM CHLORIDE 0.9 % IV SOLN
250.0000 mL | INTRAVENOUS | Status: DC
Start: 2022-03-09 — End: 2022-03-10

## 2022-03-09 MED ORDER — HYDROCHLOROTHIAZIDE 25 MG PO TABS
25.0000 mg | ORAL_TABLET | Freq: Every day | ORAL | Status: DC
  Administered 2022-03-09 – 2022-03-10 (×2): 25 mg via ORAL
  Filled 2022-03-09 (×2): qty 1

## 2022-03-09 MED ORDER — ROCURONIUM BROMIDE 10 MG/ML (PF) SYRINGE
PREFILLED_SYRINGE | INTRAVENOUS | Status: DC | PRN
  Administered 2022-03-09: 60 mg via INTRAVENOUS

## 2022-03-09 MED ORDER — 0.9 % SODIUM CHLORIDE (POUR BTL) OPTIME
TOPICAL | Status: DC | PRN
  Administered 2022-03-09: 1000 mL

## 2022-03-09 MED ORDER — DEXAMETHASONE SODIUM PHOSPHATE 10 MG/ML IJ SOLN
INTRAMUSCULAR | Status: DC | PRN
  Administered 2022-03-09: 5 mg via INTRAVENOUS

## 2022-03-09 MED ORDER — GABAPENTIN 300 MG PO CAPS: 300.0000 mg | ORAL_CAPSULE | Freq: Every day | ORAL | Status: DC

## 2022-03-09 MED ORDER — BUDESONIDE 0.25 MG/2ML IN SUSP
0.2500 mg | Freq: Two times a day (BID) | RESPIRATORY_TRACT | Status: DC
  Administered 2022-03-10: 0.25 mg via RESPIRATORY_TRACT
  Filled 2022-03-09 (×2): qty 2

## 2022-03-09 SURGICAL SUPPLY — 54 items
ADH SKN CLS APL DERMABOND .7 (GAUZE/BANDAGES/DRESSINGS) ×1
BAG COUNTER SPONGE SURGICOUNT (BAG) ×1 IMPLANT
BAG SPNG CNTER NS LX DISP (BAG) ×2
BAND INSRT 18 STRL LF DISP RB (MISCELLANEOUS) ×2
BAND RUBBER #18 3X1/16 STRL (MISCELLANEOUS) ×2 IMPLANT
BLADE CLIPPER SURG (BLADE) IMPLANT
BLADE SURG 11 STRL SS (BLADE) ×1 IMPLANT
BUR MATCHSTICK NEURO 3.0 LAGG (BURR) ×1 IMPLANT
BUR PRECISION FLUTE 5.0 (BURR) IMPLANT
CANISTER SUCT 3000ML PPV (MISCELLANEOUS) ×1 IMPLANT
DERMABOND IMPLANT
DERMABOND ADVANCED (GAUZE/BANDAGES/DRESSINGS) ×1
DERMABOND ADVANCED .7 DNX12 (GAUZE/BANDAGES/DRESSINGS) ×1 IMPLANT
DRAPE C-ARM 42X72 X-RAY (DRAPES) ×2 IMPLANT
DRAPE LAPAROTOMY 100X72X124 (DRAPES) ×1 IMPLANT
DRAPE MICROSCOPE SLANT 54X150 (MISCELLANEOUS) ×1 IMPLANT
DRAPE SURG 17X23 STRL (DRAPES) ×1 IMPLANT
DURAPREP 26ML APPLICATOR (WOUND CARE) ×1 IMPLANT
ELECT BLADE 6.5 EXT (BLADE) ×1 IMPLANT
ELECT REM PT RETURN 9FT ADLT (ELECTROSURGICAL) ×1
ELECTRODE REM PT RTRN 9FT ADLT (ELECTROSURGICAL) ×1 IMPLANT
GAUZE 4X4 16PLY ~~LOC~~+RFID DBL (SPONGE) IMPLANT
GAUZE SPONGE 4X4 12PLY STRL (GAUZE/BANDAGES/DRESSINGS) IMPLANT
GLOVE BIOGEL PI IND STRL 7.5 (GLOVE) ×1 IMPLANT
GLOVE BIOGEL PI INDICATOR 7.5 (GLOVE) ×1
GLOVE ECLIPSE 7.5 STRL STRAW (GLOVE) ×1 IMPLANT
GLOVE EXAM NITRILE LRG STRL (GLOVE) IMPLANT
GLOVE EXAM NITRILE XL STR (GLOVE) IMPLANT
GLOVE EXAM NITRILE XS STR PU (GLOVE) IMPLANT
GOWN STRL REUS W/ TWL LRG LVL3 (GOWN DISPOSABLE) ×2 IMPLANT
GOWN STRL REUS W/ TWL XL LVL3 (GOWN DISPOSABLE) IMPLANT
GOWN STRL REUS W/TWL 2XL LVL3 (GOWN DISPOSABLE) IMPLANT
GOWN STRL REUS W/TWL LRG LVL3 (GOWN DISPOSABLE) ×2
GOWN STRL REUS W/TWL XL LVL3 (GOWN DISPOSABLE)
HEMOSTAT POWDER KIT SURGIFOAM (HEMOSTASIS) ×1 IMPLANT
KIT BASIN OR (CUSTOM PROCEDURE TRAY) ×1 IMPLANT
KIT TURNOVER KIT B (KITS) ×1 IMPLANT
NDL SPNL 18GX3.5 QUINCKE PK (NEEDLE) ×1 IMPLANT
NEEDLE HYPO 22GX1.5 SAFETY (NEEDLE) ×1 IMPLANT
NEEDLE SPNL 18GX3.5 QUINCKE PK (NEEDLE) ×1 IMPLANT
NS IRRIG 1000ML POUR BTL (IV SOLUTION) ×1 IMPLANT
PACK LAMINECTOMY NEURO (CUSTOM PROCEDURE TRAY) ×1 IMPLANT
PAD ARMBOARD 7.5X6 YLW CONV (MISCELLANEOUS) ×3 IMPLANT
SPIKE FLUID TRANSFER (MISCELLANEOUS) ×1 IMPLANT
SPONGE T-LAP 4X18 ~~LOC~~+RFID (SPONGE) IMPLANT
SUT MNCRL AB 3-0 PS2 18 (SUTURE) IMPLANT
SUT MON AB 3-0 SH 27 (SUTURE) ×1
SUT MON AB 3-0 SH27 (SUTURE) IMPLANT
SUT VIC AB 0 CT1 18XCR BRD8 (SUTURE) IMPLANT
SUT VIC AB 0 CT1 8-18 (SUTURE)
SUT VIC AB 2-0 CP2 18 (SUTURE) ×1 IMPLANT
TOWEL GREEN STERILE (TOWEL DISPOSABLE) ×1 IMPLANT
TOWEL GREEN STERILE FF (TOWEL DISPOSABLE) ×1 IMPLANT
WATER STERILE IRR 1000ML POUR (IV SOLUTION) ×1 IMPLANT

## 2022-03-09 NOTE — H&P (Signed)
Surgical H&P Update  HPI: 81 y.o. 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 Her symptoms were refractory to non-surgical treatment options, therefore here for surgical intervention today. No changes in health since they were last seen. Still having the above and wishes to proceed with surgery.  PMHx:  Past Medical History:  Diagnosis Date   Asthma    CKD (chronic kidney disease) stage 4, GFR 15-29 ml/min (HCC)    Depression    Diabetes mellitus without complication ([place])    Hypertension    Stroke ([place])    2019 per patient   FamHx: History reviewed. No pertinent family history. SocHx:  reports that she quit smoking about 54 years ago. Her smoking use included cigarettes. She has never used smokeless tobacco. She reports that she does not currently use alcohol. She reports that she does not use drugs.  Physical Exam: Strength 5/5 x4 and SILTx4   Assesment/Plan: 81 y.o. woman with spinal stenosis and neurogenic claudication, here for L4-5 MIS laminectomy. Risks, benefits, and alternatives discussed and the patient would like to continue with surgery.  -OR today -3C post-op  Judith Part, MD 03/09/22 2:44 PM

## 2022-03-09 NOTE — Anesthesia Postprocedure Evaluation (Signed)
Anesthesia Post Note  Patient: Kamaya Keckler  Procedure(s) Performed: Lumbar Four-Five Minimally Invasive Laminectomy/Metrx     Patient location during evaluation: PACU Anesthesia Type: General Level of consciousness: sedated, patient cooperative and oriented Pain management: pain level controlled Vital Signs Assessment: post-procedure vital signs reviewed and stable Respiratory status: spontaneous breathing, nonlabored ventilation, respiratory function stable and patient connected to nasal cannula oxygen Cardiovascular status: stable Postop Assessment: no apparent nausea or vomiting Anesthetic complications: no   No notable events documented.  Last Vitals:  Vitals:   03/09/22 1800 03/09/22 1815  BP: (!) 179/79 (!) 170/91  Pulse: 74 75  Resp: 15 17  Temp:    SpO2: 100% 92%    Last Pain:  Vitals:   03/09/22 1815  TempSrc:   PainSc: Asleep          RLE Sensation: Full sensation (03/09/22 1815)      Seleta Rhymes. Melisssa Donner

## 2022-03-09 NOTE — Anesthesia Procedure Notes (Signed)
Procedure Name: Intubation Date/Time: 03/09/2022 3:43 PM  Performed by: Barrington Ellison, CRNAPre-anesthesia Checklist: Patient identified, Emergency Drugs available, Suction available and Patient being monitored Patient Re-evaluated:Patient Re-evaluated prior to induction Oxygen Delivery Method: Circle System Utilized Preoxygenation: Pre-oxygenation with 100% oxygen Induction Type: IV induction Ventilation: Mask ventilation without difficulty Laryngoscope Size: Mac and 3 Grade View: Grade I Tube type: Oral Tube size: 7.0 mm Number of attempts: 1 Airway Equipment and Method: Stylet and Oral airway Placement Confirmation: ETT inserted through vocal cords under direct vision, positive ETCO2 and breath sounds checked- equal and bilateral Secured at: 21 cm Tube secured with: Tape Dental Injury: Teeth and Oropharynx as per pre-operative assessment

## 2022-03-09 NOTE — Transfer of Care (Signed)
Immediate Anesthesia Transfer of Care Note  Patient: Teresa Daniels  Procedure(s) Performed: Lumbar Four-Five Minimally Invasive Laminectomy/Metrx  Patient Location: PACU  Anesthesia Type:General  Level of Consciousness: awake, alert  and oriented  Airway & Oxygen Therapy: Patient Spontanous Breathing and Patient connected to nasal cannula oxygen  Post-op Assessment: Report given to RN and Patient moving all extremities X 4  Post vital signs: Reviewed and stable  Last Vitals:  Vitals Value Taken Time  BP 167/74 03/09/22 1726  Temp    Pulse 84 03/09/22 1730  Resp 20 03/09/22 1730  SpO2 95 % 03/09/22 1730  Vitals shown include unvalidated device data.  Last Pain:  Vitals:   03/09/22 1227  TempSrc:   PainSc: 5          Complications: No notable events documented.

## 2022-03-09 NOTE — Op Note (Signed)
PATIENT: Teresa Daniels  DAY OF SURGERY: 03/09/22   PRE-OPERATIVE DIAGNOSIS:  Lumbar stenosis with neurogenic claudication   POST-OPERATIVE DIAGNOSIS:  Lumbar stenosis with neurogenic claudication   PROCEDURE:  Minimally invasive L4-5 laminectomy   SURGEON:  Surgeon(s) and Role:    Judith Part, MD - Primary    Consuella Lose, MD - Assist   ANESTHESIA: ETGA   BRIEF HISTORY: This is an 81 year old woman who presented with symptoms of neurogenic claudication. The patient was found to have L4-5 central canal stenosis. I therefore recommended a minimally invasive L4-5 laminectomy. This was discussed with the patient as well as risks, benefits, and alternatives and the patient wished to proceed with surgical treatment.   OPERATIVE DETAIL: The patient was taken to the operating room and placed on the OR table in the prone position. A formal time out was performed with two patient identifiers and confirmed the operative site. Anesthesia was induced by the anesthesia team. The operative site was marked, hair was clipped with surgical clippers, the area was then prepped and draped in a sterile fashion. Fluoroscopy was used to identify the surgical level with a spinal needle. A 2cm incision was then marked 1cm off to the left of midline. The fascia was incised sharply and serial dilators were docked to the spinolaminar interface on L4. A final tubular distractor was placed and secured to the table and fluoro again confirmed the correct surgical level. The spinous process, lamina, and facet locations were confirmed for orientation. A high speed drill was then used to perform an L4 laminotomy until the insertion of the ligamentum flavum was identified superiorly. This was then extended to complete the L4 hemilaminotomy by extending medially to the midline and laterally to the lateral insertion of the ligamentum flavum. The table was tilted and tubular dilator was adjusted to look across midline. An  'over the top' decompression was then performed by dissecting the ligamentum flavum off of the spinous process and lamina and identifying its insertion superiorly on the lamina and laterally at the facet. A #9 suction was then used to protect the thecal sac while the contralateral lamina was removed. With drilling complete, the ligamentum flavum was then completely resected from its insertion and dura was palpated in all directions to confirm good decompression. Hemostasis was obtained, the wound was copiously irrigated, and the tube was removed while using the microscope to confirm hemostasis of the muscle edges. All instrument and sponge counts were correct and the incision was then closed in layers. The patient was then returned to anesthesia for emergence. No apparent complications at the completion of the procedure.   EBL:  63mL   DRAINS: none   SPECIMENS: none   Judith Part, MD 03/09/22 5:08 PM

## 2022-03-10 ENCOUNTER — Inpatient Hospital Stay (HOSPITAL_BASED_OUTPATIENT_CLINIC_OR_DEPARTMENT_OTHER)
Admission: EM | Admit: 2022-03-10 | Discharge: 2022-03-14 | DRG: 909 | Disposition: A | Discharge status: Home Health | Payer: Medicare PPO | Attending: Neurological Surgery | Admitting: Neurological Surgery

## 2022-03-10 ENCOUNTER — Emergency Department (HOSPITAL_BASED_OUTPATIENT_CLINIC_OR_DEPARTMENT_OTHER): Payer: Medicare PPO

## 2022-03-10 ENCOUNTER — Encounter (HOSPITAL_COMMUNITY): Payer: Self-pay

## 2022-03-10 ENCOUNTER — Other Ambulatory Visit: Payer: Self-pay

## 2022-03-10 ENCOUNTER — Encounter (HOSPITAL_COMMUNITY): Payer: Self-pay | Admitting: Neurological Surgery

## 2022-03-10 DIAGNOSIS — N184 Chronic kidney disease, stage 4 (severe): Secondary | ICD-10-CM | POA: Diagnosis present

## 2022-03-10 DIAGNOSIS — G9761 Postprocedural hematoma of a nervous system organ or structure following a nervous system procedure: Principal | ICD-10-CM | POA: Diagnosis present

## 2022-03-10 DIAGNOSIS — Z8673 Personal history of transient ischemic attack (TIA), and cerebral infarction without residual deficits: Secondary | ICD-10-CM

## 2022-03-10 DIAGNOSIS — J45909 Unspecified asthma, uncomplicated: Secondary | ICD-10-CM | POA: Diagnosis present

## 2022-03-10 DIAGNOSIS — G8918 Other acute postprocedural pain: Secondary | ICD-10-CM | POA: Diagnosis present

## 2022-03-10 DIAGNOSIS — M48062 Spinal stenosis, lumbar region with neurogenic claudication: Secondary | ICD-10-CM | POA: Diagnosis present

## 2022-03-10 DIAGNOSIS — Z79899 Other long term (current) drug therapy: Secondary | ICD-10-CM

## 2022-03-10 DIAGNOSIS — I129 Hypertensive chronic kidney disease with stage 1 through stage 4 chronic kidney disease, or unspecified chronic kidney disease: Secondary | ICD-10-CM | POA: Diagnosis present

## 2022-03-10 DIAGNOSIS — Z87891 Personal history of nicotine dependence: Secondary | ICD-10-CM

## 2022-03-10 DIAGNOSIS — D631 Anemia in chronic kidney disease: Secondary | ICD-10-CM | POA: Diagnosis present

## 2022-03-10 DIAGNOSIS — E119 Type 2 diabetes mellitus without complications: Secondary | ICD-10-CM | POA: Diagnosis present

## 2022-03-10 DIAGNOSIS — I10 Essential (primary) hypertension: Secondary | ICD-10-CM | POA: Diagnosis present

## 2022-03-10 DIAGNOSIS — E1122 Type 2 diabetes mellitus with diabetic chronic kidney disease: Secondary | ICD-10-CM | POA: Diagnosis present

## 2022-03-10 DIAGNOSIS — F32A Depression, unspecified: Secondary | ICD-10-CM | POA: Diagnosis present

## 2022-03-10 DIAGNOSIS — I447 Left bundle-branch block, unspecified: Secondary | ICD-10-CM | POA: Diagnosis present

## 2022-03-10 DIAGNOSIS — Z7984 Long term (current) use of oral hypoglycemic drugs: Secondary | ICD-10-CM

## 2022-03-10 DIAGNOSIS — Z9889 Other specified postprocedural states: Secondary | ICD-10-CM

## 2022-03-10 DIAGNOSIS — Z7902 Long term (current) use of antithrombotics/antiplatelets: Secondary | ICD-10-CM

## 2022-03-10 DIAGNOSIS — E785 Hyperlipidemia, unspecified: Secondary | ICD-10-CM | POA: Diagnosis present

## 2022-03-10 DIAGNOSIS — Y838 Other surgical procedures as the cause of abnormal reaction of the patient, or of later complication, without mention of misadventure at the time of the procedure: Secondary | ICD-10-CM | POA: Diagnosis present

## 2022-03-10 DIAGNOSIS — M5410 Radiculopathy, site unspecified: Secondary | ICD-10-CM

## 2022-03-10 DIAGNOSIS — S064XAA Epidural hemorrhage with loss of consciousness status unknown, initial encounter: Secondary | ICD-10-CM | POA: Diagnosis present

## 2022-03-10 LAB — GLUCOSE, CAPILLARY: Glucose-Capillary: 284 mg/dL — ABNORMAL HIGH (ref 70–99)

## 2022-03-10 MED ORDER — OXYCODONE HCL 5 MG PO TABS
5.0000 mg | ORAL_TABLET | ORAL | Status: AC
  Administered 2022-03-10: 5 mg via ORAL
  Filled 2022-03-10: qty 1

## 2022-03-10 MED ORDER — ALBUTEROL SULFATE HFA 108 (90 BASE) MCG/ACT IN AERS
2.0000 | INHALATION_SPRAY | RESPIRATORY_TRACT | Status: DC | PRN
Start: 2022-03-10 — End: 2022-03-11

## 2022-03-10 MED ORDER — CLOPIDOGREL BISULFATE 75 MG PO TABS: 75.0000 mg | ORAL_TABLET | Freq: Every morning | ORAL | Status: DC

## 2022-03-10 MED ORDER — DEXAMETHASONE SODIUM PHOSPHATE 10 MG/ML IJ SOLN
6.0000 mg | Freq: Once | INTRAMUSCULAR | Status: AC
Start: 2022-03-10 — End: 2022-03-10
  Administered 2022-03-10: 6 mg via INTRAMUSCULAR
  Filled 2022-03-10: qty 1

## 2022-03-10 NOTE — ED Notes (Signed)
Ultrasound in with patient at this time.

## 2022-03-10 NOTE — ED Triage Notes (Addendum)
Patient arrives with complaints of leg pain.  Patient had back surgery yesterday at [facility], [place] and was discharged this morning. Patient was walking this morning and not in pain.  Patient states that the pain in both legs started this evening and her prescribed pain medication did not help. Patient also had some shortness of breath en route.   Family is concerned about DVT.   Took 750mg  of Tylenol at 2000 (prior to arrival). Hx of cardiac Bundle Branch Block

## 2022-03-10 NOTE — ED Provider Notes (Signed)
[facility] EMERGENCY DEPT Provider Note   CSN: [id number] Arrival date & time: 03/10/22  2055     History {Add pertinent medical, surgical, social history, OB history to HPI:1} Chief Complaint  Patient presents with   Leg Pain   Shortness of Breath    Teresa Daniels is a 81 y.o. female.  Dr Zada Finders [facility] R foot 8pm moved up legs to BL knees then glutes No weakness or numbness or back pain Took oxycodone at 5pm   Leg Pain Shortness of Breath      Home Medications Prior to Admission medications   Medication Sig Start Date End Date Taking? Authorizing Provider  acetaminophen (TYLENOL) 325 MG tablet Take 2 tablets (650 mg total) by mouth every 6 (six) hours as needed for mild pain (or Fever >/= 101). 02/11/21   McClung, Sarah A, PA-C  albuterol (VENTOLIN HFA) 108 (90 Base) MCG/ACT inhaler Inhale 2 puffs into the lungs every 6 (six) hours as needed for wheezing or shortness of breath.    [provider]  atorvastatin (LIPITOR) 40 MG tablet Take 40 mg by mouth at bedtime.    [provider]  beclomethasone (QVAR) 40 MCG/ACT inhaler Inhale 1 puff into the lungs 2 (two) times daily as needed (shortness of breath).    [provider]  cetirizine (ZYRTEC) 10 MG tablet Take 10 mg by mouth daily.    [provider]  Cholecalciferol (VITAMIN D-3) 25 MCG (1000 UT) CAPS Take 1,000 Units by mouth daily after supper.    [provider]  clopidogrel (PLAVIX) 75 MG tablet Take 1 tablet (75 mg total) by mouth in the morning. Restart on 03/12/22 03/10/22   Judith Part, MD  diclofenac Sodium (VOLTAREN) 1 % GEL Apply 2 g topically daily as needed (pain).    [provider]  ENDOCET 2.5-325 MG tablet Take 1 tablet by mouth at bedtime. 12/15/21   [provider]  ferrous sulfate 325 (65 FE) MG tablet Take 325 mg by mouth daily after supper.    [provider]  Fish Oil-Krill Oil (KRILL & FISH OIL  BLEND PO) Take 1 capsule by mouth daily after breakfast.    [provider]  gabapentin (NEURONTIN) 300 MG capsule Take 300 mg by mouth daily after supper. 11/26/17   [provider]  glipiZIDE (GLUCOTROL XL) 2.5 MG 24 hr tablet Take 2.5 mg by mouth in the morning. 09/28/20   [provider]  hydrochlorothiazide (HYDRODIURIL) 25 MG tablet Take 25 mg by mouth daily.    [provider]  losartan (COZAAR) 50 MG tablet Take 50 mg by mouth daily.    [provider]  MAGNESIUM PO Take 250 mg by mouth daily.     [provider]  montelukast (SINGULAIR) 10 MG tablet Take 10 mg by mouth daily after supper.    [provider]  pantoprazole (PROTONIX) 40 MG tablet Take 40 mg by mouth daily before breakfast.    [provider]  sertraline (ZOLOFT) 100 MG tablet Take 100 mg by mouth daily after supper. 02/18/18   [provider]  vitamin B-12 (CYANOCOBALAMIN) 1000 MCG tablet Take 1,000 mcg by mouth daily.    [provider]      Allergies    Cat hair extract    Review of Systems   Review of Systems  Respiratory:  Positive for shortness of breath.     Physical Exam Updated Vital Signs BP (!) 160/71   Pulse  82   Temp 99.2 F (37.3 C) (Oral)   Resp 19   Ht 5\' 4"  (1.626 m)   Wt 79.4 kg   SpO2 97%   BMI 30.04 kg/m  Physical Exam  ED Results / Procedures / Treatments   Labs (all labs ordered are listed, but only abnormal results are displayed) Labs Reviewed - No data to display  EKG EKG Interpretation  Date/Time:  Friday March 10 2022 21:15:07 EDT Ventricular Rate:  81 PR Interval:  142 QRS Duration: 130 QT Interval:  378 QTC Calculation: 439 R Axis:   0 Text Interpretation: Normal sinus rhythm Left bundle branch block Abnormal ECG When compared with ECG of 25-Jan-2022 10:45, T wave inversion now evident in Inferior leads QT has shortened Confirmed by Margaretmary Eddy [phone]) on 03/10/2022 11:09:52  PM  Radiology US Venous Img Lower Bilateral  Result Date: 03/10/2022 CLINICAL DATA:  Bilateral leg swelling EXAM: BILATERAL LOWER EXTREMITY VENOUS DOPPLER ULTRASOUND TECHNIQUE: Gray-scale sonography with compression, as well as color and duplex ultrasound, were performed to evaluate the deep venous system(s) from the level of the common femoral vein through the popliteal and proximal calf veins. COMPARISON:  None Available. FINDINGS: VENOUS Normal compressibility of the common femoral, superficial femoral, and popliteal veins, as well as the visualized calf veins. Visualized portions of profunda femoral vein and great saphenous vein unremarkable. No filling defects to suggest DVT on grayscale or color Doppler imaging. Doppler waveforms show normal direction of venous flow, normal respiratory plasticity and response to augmentation. OTHER Moderate subcutaneous edema within the lower extremities bilaterally Limitations: none IMPRESSION: Negative. Electronically Signed   By: Fidela Salisbury M.D.   On: 03/10/2022 23:01   DG Chest Port 1 View  Result Date: 03/10/2022 CLINICAL DATA:  Shortness of breath. EXAM: PORTABLE CHEST 1 VIEW COMPARISON:  02/06/2021. FINDINGS: The heart size and mediastinal contours are within normal limits. Interstitial prominence is noted bilaterally. Stable apical pleural scarring on the right. No consolidation, effusion, or pneumothorax. No acute osseous abnormality. IMPRESSION: Interstitial prominence bilaterally which may be chronic versus related to edema or infection. Electronically Signed   By: Brett Fairy M.D.   On: 03/10/2022 22:00   DG Lumbar Spine 2-3 Views  Result Date: 03/09/2022 CLINICAL DATA:  Lumbar 4 5 laminectomy. EXAM: LUMBAR SPINE - 2-3 VIEW COMPARISON:  MRI lumbar spine 09/22/2021 FINDINGS: Intraoperative lumbar spine. 1 low resolution intraoperative spot views of the lumbar spine were obtained. No fracture visible on the limited views. Total fluoroscopy time: 9.6  seconds Total radiation dose: 5.56 micro Gy IMPRESSION: Intraoperative lumbar spine. Electronically Signed   By: Ronney Asters M.D.   On: 03/09/2022 20:13    Procedures Procedures  {Document cardiac monitor, telemetry assessment procedure when appropriate:1}  Medications Ordered in ED Medications  albuterol (VENTOLIN HFA) 108 (90 Base) MCG/ACT inhaler 2 puff (has no administration in time range)  oxyCODONE (Oxy IR/ROXICODONE) immediate release tablet 5 mg (5 mg Oral Given 03/10/22 2224)    ED Course/ Medical Decision Making/ A&P                           Medical Decision Making Amount and/or Complexity of Data Reviewed Radiology: ordered.  Risk Prescription drug management.   ***  {Document critical care time when appropriate:1} {Document review of labs and clinical decision tools ie heart score, Chads2Vasc2 etc:1}  {Document your independent review of radiology images, and any outside records:1} {Document your discussion with family members,  caretakers, and with consultants:1} {Document social determinants of health affecting pt's care:1} {Document your decision making why or why not admission, treatments were needed:1} Final Clinical Impression(s) / ED Diagnoses Final diagnoses:  None    Rx / DC Orders ED Discharge Orders     None

## 2022-03-10 NOTE — Progress Notes (Signed)
Patient alert and oriented, incision site clean, dry no sign of infection. D/c instruction explain to the patient daughter and copy given. All questions answer. Pt. D/c per order

## 2022-03-10 NOTE — Discharge Summary (Signed)
Discharge Summary  Date of Admission: 03/09/2022  Date of Discharge: 03/10/22  Attending Physician: Emelda Brothers, MD  Hospital Course: Patient was admitted following an uncomplicated M2-1 MIS laminectomy. They were recovered in PACU and transferred to [facility]. Their preop symptoms were improved, their hospital course was uncomplicated and the patient was discharged home on 03/10/22. They will follow up in clinic with me in clinic in 2 weeks.  Neurologic exam at discharge:  Strength 5/5 x4 and SILTx4   Discharge diagnosis: Lumbar stenosis with neurogenic claudication   Judith Part, MD 03/10/22 8:39 AM

## 2022-03-10 NOTE — Care Management Obs Status (Signed)
[facility] NOTIFICATION   Patient Details  Name: Teresa Daniels MRN: [id number] Date of Birth: 1941/04/27   Medicare Observation Status Notification Given:  Yes    Ella Bodo, RN 03/10/2022, 10:06 AM

## 2022-03-10 NOTE — Progress Notes (Signed)
Neurosurgery Service Progress Note  Subjective: No acute events overnight, leg pain improved from preop, got confused with oxycodone ovenright, pain well controlled   Objective: Vitals:   03/09/22 2313 03/10/22 0337 03/10/22 0741 03/10/22 0829  BP:  126/67 135/61   Pulse: 73 71 79 71  Resp:  18 16 16   Temp:  98.8 F (37.1 C) 99.6 F (37.6 C)   TempSrc:  Oral Oral   SpO2: 91% 93% 92% 92%  Weight:      Height:        Physical Exam: Strength 5/5 x4 and SILTx4   Assessment & Plan: 81 y.o. woman s/p MIS lami for claudication, recovering well.  -discharge home today  Judith Part  03/10/22 8:37 AM

## 2022-03-10 NOTE — Evaluation (Signed)
Occupational Therapy Evaluation Patient Details Name: Teresa Daniels MRN: [id number] DOB: 10-05-1940 Today's Date: 03/10/2022   History of Present Illness 81 yo F s/p SLIT.  PMH includes: CVA, neuropathy, CKD.   Clinical Impression   Patient admitted for the diagnosis above.  PTA she lives alone in a local ILF.  She is Mod I at 4WRW level in her apartment, and walks to the common dinning room.  Unsteadiness and decreased safety are the primary deficits, most likely due to effects of medication.  Her daughter will be staying with her, and she is planning on continuing OT and PT at the [facility] level post stroke.  Precautions sheet issued and reviewed, all questions answered, and no further OT needed in the acute setting.        Recommendations for follow up therapy are one component of a multi-disciplinary discharge planning process, led by the attending physician.  Recommendations may be updated based on patient status, additional functional criteria and insurance authorization.   Follow Up Recommendations  Other (comment) (continue with HH based OT/PT)    Assistance Recommended at Discharge Frequent or constant Supervision/Assistance  Patient can return home with the following A little help with walking and/or transfers;A little help with bathing/dressing/bathroom;Assist for transportation;Assistance with cooking/housework;Direct supervision/assist for medications management    Functional Status Assessment  Patient has had a recent decline in their functional status and demonstrates the ability to make significant improvements in function in a reasonable and predictable amount of time.  Equipment Recommendations  None recommended by OT    Recommendations for Other Services       Precautions / Restrictions Precautions Precautions: Back Precaution Booklet Issued: Yes (comment) Restrictions Weight Bearing Restrictions: No      Mobility Bed Mobility Overal bed mobility: Needs  Assistance Bed Mobility: Sidelying to Sit   Sidelying to sit: Supervision            Transfers Overall transfer level: Needs assistance   Transfers: Sit to/from Stand, Bed to chair/wheelchair/BSC Sit to Stand: Supervision     Step pivot transfers: Min guard            Balance Overall balance assessment: Needs assistance Sitting-balance support: Feet supported Sitting balance-Leahy Scale: Good     Standing balance support: Reliant on assistive device for balance Standing balance-Leahy Scale: Poor                             ADL either performed or assessed with clinical judgement   ADL       Grooming: Supervision/safety;Standing               Lower Body Dressing: Minimal assistance;Sit to/from stand   Toilet Transfer: Engineer, manufacturing (4 wheels);Regular Toilet                   Vision   Vision Assessment?: No apparent visual deficits     Perception Perception Perception: Not tested   Praxis Praxis Praxis: Not tested    Pertinent Vitals/Pain Pain Assessment Pain Assessment: No/denies pain     Hand Dominance Right   Extremity/Trunk Assessment Upper Extremity Assessment Upper Extremity Assessment: Overall WFL for tasks assessed   Lower Extremity Assessment Lower Extremity Assessment: Overall WFL for tasks assessed   Cervical / Trunk Assessment Cervical / Trunk Assessment: Kyphotic   Communication Communication Communication: No difficulties   Cognition Arousal/Alertness: Awake/alert Behavior During Therapy: Impulsive Overall Cognitive Status: Impaired/Different from baseline Area of Impairment:  Attention, Following commands, Safety/judgement, Problem solving                   Current Attention Level: Selective   Following Commands: Follows multi-step commands with increased time Safety/Judgement: Decreased awareness of safety   Problem Solving: Slow processing, Requires verbal cues       General  Comments   VSS    Exercises     Shoulder Instructions      Home Living Family/patient expects to be discharged to:: Private residence Living Arrangements: Alone Available Help at Discharge: Family;Friend(s);Available 24 hours/day;Available PRN/intermittently Type of Home: Independent living facility Home Access: Level entry;Elevator     Home Layout: One level     Bathroom Shower/Tub: Walk-in shower;Sponge bathes at baseline   Bathroom Toilet: Handicapped height Bathroom Accessibility: Yes How Accessible: Accessible via walker Home Equipment: [place] (2 wheels);Rollator (4 wheels);Cane - single point;Adaptive equipment Adaptive Equipment: Reacher        Prior Functioning/Environment Prior Level of Function : Needs assist             Mobility Comments: walks with 4WRW ADLs Comments: assist with meals        OT Problem List: Impaired balance (sitting and/or standing);Decreased activity tolerance;Decreased safety awareness      OT Treatment/Interventions:      OT Goals(Current goals can be found in the care plan section) Acute Rehab OT Goals Patient Stated Goal: returning to ALF/ILF OT Goal Formulation: With patient Time For Goal Achievement: 03/13/22 Potential to Achieve Goals: Good  OT Frequency:      Co-evaluation              AM-PAC OT "6 Clicks" Daily Activity     Outcome Measure Help from another person eating meals?: None Help from another person taking care of personal grooming?: A Little Help from another person toileting, which includes using toliet, bedpan, or urinal?: A Little Help from another person bathing (including washing, rinsing, drying)?: A Little Help from another person to put on and taking off regular upper body clothing?: None Help from another person to put on and taking off regular lower body clothing?: A Little 6 Click Score: 20   End of Session Equipment Utilized During Treatment: Gait belt;Rollator (4  wheels) Nurse Communication: Mobility status  Activity Tolerance: Patient tolerated treatment well Patient left: in bed;with call bell/phone within reach;with family/visitor present  OT Visit Diagnosis: Unsteadiness on feet (R26.81)                Time: 7026-3785 OT Time Calculation (min): 22 min Charges:  OT General Charges $OT Visit: 1 Visit OT Evaluation $OT Eval Moderate Complexity: 1 Mod  03/10/2022  RP, OTR/L  Acute Rehabilitation Services  Office:  [phone]   Metta Clines 03/10/2022, 10:52 AM

## 2022-03-11 ENCOUNTER — Observation Stay (HOSPITAL_COMMUNITY): Payer: Medicare PPO | Admitting: Anesthesiology

## 2022-03-11 ENCOUNTER — Encounter (HOSPITAL_COMMUNITY): Payer: Self-pay | Admitting: Neurosurgery

## 2022-03-11 ENCOUNTER — Encounter (HOSPITAL_COMMUNITY)
Admission: EM | Disposition: A | Discharge status: Home / Self Care | Payer: Self-pay | Source: Home / Self Care | Attending: Neurosurgery

## 2022-03-11 ENCOUNTER — Other Ambulatory Visit: Payer: Self-pay

## 2022-03-11 ENCOUNTER — Observation Stay (HOSPITAL_COMMUNITY): Payer: Medicare PPO

## 2022-03-11 DIAGNOSIS — E1122 Type 2 diabetes mellitus with diabetic chronic kidney disease: Secondary | ICD-10-CM | POA: Diagnosis present

## 2022-03-11 DIAGNOSIS — G9761 Postprocedural hematoma of a nervous system organ or structure following a nervous system procedure: Secondary | ICD-10-CM | POA: Diagnosis present

## 2022-03-11 DIAGNOSIS — G8918 Other acute postprocedural pain: Secondary | ICD-10-CM | POA: Diagnosis present

## 2022-03-11 DIAGNOSIS — I447 Left bundle-branch block, unspecified: Secondary | ICD-10-CM | POA: Diagnosis present

## 2022-03-11 DIAGNOSIS — J45909 Unspecified asthma, uncomplicated: Secondary | ICD-10-CM

## 2022-03-11 DIAGNOSIS — G9762 Postprocedural hematoma of a nervous system organ or structure following other procedure: Secondary | ICD-10-CM

## 2022-03-11 DIAGNOSIS — E785 Hyperlipidemia, unspecified: Secondary | ICD-10-CM | POA: Diagnosis present

## 2022-03-11 DIAGNOSIS — D631 Anemia in chronic kidney disease: Secondary | ICD-10-CM | POA: Diagnosis present

## 2022-03-11 DIAGNOSIS — I129 Hypertensive chronic kidney disease with stage 1 through stage 4 chronic kidney disease, or unspecified chronic kidney disease: Secondary | ICD-10-CM | POA: Diagnosis present

## 2022-03-11 DIAGNOSIS — I10 Essential (primary) hypertension: Secondary | ICD-10-CM | POA: Diagnosis not present

## 2022-03-11 DIAGNOSIS — Z7902 Long term (current) use of antithrombotics/antiplatelets: Secondary | ICD-10-CM | POA: Diagnosis not present

## 2022-03-11 DIAGNOSIS — Z87891 Personal history of nicotine dependence: Secondary | ICD-10-CM | POA: Diagnosis not present

## 2022-03-11 DIAGNOSIS — N184 Chronic kidney disease, stage 4 (severe): Secondary | ICD-10-CM | POA: Diagnosis present

## 2022-03-11 DIAGNOSIS — M549 Dorsalgia, unspecified: Secondary | ICD-10-CM | POA: Diagnosis present

## 2022-03-11 DIAGNOSIS — Z8673 Personal history of transient ischemic attack (TIA), and cerebral infarction without residual deficits: Secondary | ICD-10-CM | POA: Diagnosis not present

## 2022-03-11 DIAGNOSIS — M48062 Spinal stenosis, lumbar region with neurogenic claudication: Secondary | ICD-10-CM | POA: Diagnosis present

## 2022-03-11 DIAGNOSIS — Z79899 Other long term (current) drug therapy: Secondary | ICD-10-CM | POA: Diagnosis not present

## 2022-03-11 DIAGNOSIS — Z7984 Long term (current) use of oral hypoglycemic drugs: Secondary | ICD-10-CM | POA: Diagnosis not present

## 2022-03-11 DIAGNOSIS — Y838 Other surgical procedures as the cause of abnormal reaction of the patient, or of later complication, without mention of misadventure at the time of the procedure: Secondary | ICD-10-CM | POA: Diagnosis present

## 2022-03-11 DIAGNOSIS — F32A Depression, unspecified: Secondary | ICD-10-CM | POA: Diagnosis present

## 2022-03-11 HISTORY — PX: LUMBAR WOUND DEBRIDEMENT: SHX1988

## 2022-03-11 LAB — CBC WITH DIFFERENTIAL/PLATELET
Abs Immature Granulocytes: 0.02 10*3/uL (ref 0.00–0.07)
Basophils Absolute: 0 10*3/uL (ref 0.0–0.1)
Basophils Relative: 0 %
Eosinophils Absolute: 0.1 10*3/uL (ref 0.0–0.5)
Eosinophils Relative: 1 %
HCT: 27.6 % — ABNORMAL LOW (ref 36.0–46.0)
Hemoglobin: 9.4 g/dL — ABNORMAL LOW (ref 12.0–15.0)
Immature Granulocytes: 0 %
Lymphocytes Relative: 13 %
Lymphs Abs: 1.1 10*3/uL (ref 0.7–4.0)
MCH: 34.4 pg — ABNORMAL HIGH (ref 26.0–34.0)
MCHC: 34.1 g/dL (ref 30.0–36.0)
MCV: 101.1 fL — ABNORMAL HIGH (ref 80.0–100.0)
Monocytes Absolute: 0.7 10*3/uL (ref 0.1–1.0)
Monocytes Relative: 9 %
Neutro Abs: 6 10*3/uL (ref 1.7–7.7)
Neutrophils Relative %: 77 %
Platelets: 178 10*3/uL (ref 150–400)
RBC: 2.73 MIL/uL — ABNORMAL LOW (ref 3.87–5.11)
RDW: 12.5 % (ref 11.5–15.5)
WBC: 7.9 10*3/uL (ref 4.0–10.5)
nRBC: 0 % (ref 0.0–0.2)

## 2022-03-11 LAB — BASIC METABOLIC PANEL
Anion gap: 10 (ref 5–15)
BUN: 40 mg/dL — ABNORMAL HIGH (ref 8–23)
CO2: 26 mmol/L (ref 22–32)
Calcium: 9 mg/dL (ref 8.9–10.3)
Chloride: 99 mmol/L (ref 98–111)
Creatinine, Ser: 1.59 mg/dL — ABNORMAL HIGH (ref 0.44–1.00)
GFR, Estimated: 33 mL/min — ABNORMAL LOW (ref >60)
Glucose, Bld: 296 mg/dL — ABNORMAL HIGH (ref 70–99)
Potassium: 4.1 mmol/L (ref 3.5–5.1)
Sodium: 135 mmol/L (ref 135–145)

## 2022-03-11 LAB — GLUCOSE, CAPILLARY
Glucose-Capillary: 283 mg/dL — ABNORMAL HIGH (ref 70–99)
Glucose-Capillary: 208 mg/dL — ABNORMAL HIGH (ref 70–99)
Glucose-Capillary: 192 mg/dL — ABNORMAL HIGH (ref 70–99)
Glucose-Capillary: 196 mg/dL — ABNORMAL HIGH (ref 70–99)
Glucose-Capillary: 208 mg/dL — ABNORMAL HIGH (ref 70–99)

## 2022-03-11 SURGERY — LUMBAR WOUND DEBRIDEMENT
Anesthesia: General | Site: Back

## 2022-03-11 MED ORDER — MAGNESIUM OXIDE -MG SUPPLEMENT 400 (240 MG) MG PO TABS
200.0000 mg | ORAL_TABLET | Freq: Every day | ORAL | Status: DC
  Administered 2022-03-12 – 2022-03-14 (×3): 200 mg via ORAL
  Filled 2022-03-11 (×4): qty 1

## 2022-03-11 MED ORDER — PHENYLEPHRINE 80 MCG/ML (10ML) SYRINGE FOR IV PUSH (FOR BLOOD PRESSURE SUPPORT)
PREFILLED_SYRINGE | INTRAVENOUS | Status: DC | PRN
  Administered 2022-03-11 (×2): 80 ug via INTRAVENOUS

## 2022-03-11 MED ORDER — BUPIVACAINE HCL (PF) 0.25 % IJ SOLN
INTRAMUSCULAR | Status: AC
Start: 2022-03-11 — End: ?
  Filled 2022-03-11: qty 30

## 2022-03-11 MED ORDER — METHOCARBAMOL 1000 MG/10ML IJ SOLN
500.0000 mg | Freq: Four times a day (QID) | INTRAVENOUS | Status: DC | PRN
  Administered 2022-03-11 (×2): 500 mg via INTRAVENOUS
  Filled 2022-03-11 (×2): qty 5

## 2022-03-11 MED ORDER — LACTATED RINGERS IV SOLN: INTRAVENOUS | Status: DC

## 2022-03-11 MED ORDER — HYDROCHLOROTHIAZIDE 25 MG PO TABS
25.0000 mg | ORAL_TABLET | Freq: Every day | ORAL | Status: DC
  Administered 2022-03-12 – 2022-03-14 (×3): 25 mg via ORAL
  Filled 2022-03-11 (×4): qty 1

## 2022-03-11 MED ORDER — THROMBIN 5000 UNITS EX SOLR
CUTANEOUS | Status: AC
Start: 2022-03-11 — End: ?
  Filled 2022-03-11: qty 15000

## 2022-03-11 MED ORDER — ROCURONIUM BROMIDE 10 MG/ML (PF) SYRINGE
PREFILLED_SYRINGE | INTRAVENOUS | Status: AC
  Filled 2022-03-11: qty 10

## 2022-03-11 MED ORDER — LOSARTAN POTASSIUM 50 MG PO TABS
50.0000 mg | ORAL_TABLET | Freq: Every day | ORAL | Status: DC
Start: 2022-03-11 — End: 2022-03-14
  Administered 2022-03-12 – 2022-03-14 (×3): 50 mg via ORAL
  Filled 2022-03-11 (×4): qty 1

## 2022-03-11 MED ORDER — SODIUM CHLORIDE 0.9 % IV SOLN: 250.0000 mL | INTRAVENOUS | Status: DC | PRN

## 2022-03-11 MED ORDER — PHENYLEPHRINE 80 MCG/ML (10ML) SYRINGE FOR IV PUSH (FOR BLOOD PRESSURE SUPPORT)
PREFILLED_SYRINGE | INTRAVENOUS | Status: AC
Start: 2022-03-11 — End: ?
  Filled 2022-03-11: qty 10

## 2022-03-11 MED ORDER — LORATADINE 10 MG PO TABS
10.0000 mg | ORAL_TABLET | Freq: Every day | ORAL | Status: DC
  Administered 2022-03-12 – 2022-03-14 (×3): 10 mg via ORAL
  Filled 2022-03-11 (×4): qty 1

## 2022-03-11 MED ORDER — 0.9 % SODIUM CHLORIDE (POUR BTL) OPTIME
TOPICAL | Status: DC | PRN
  Administered 2022-03-11: 1000 mL

## 2022-03-11 MED ORDER — HYDRALAZINE HCL 20 MG/ML IJ SOLN: 5.0000 mg | Freq: Once | INTRAMUSCULAR | Status: AC

## 2022-03-11 MED ORDER — PROPOFOL 10 MG/ML IV BOLUS
INTRAVENOUS | Status: AC
  Filled 2022-03-11: qty 20

## 2022-03-11 MED ORDER — ROCURONIUM BROMIDE 10 MG/ML (PF) SYRINGE
PREFILLED_SYRINGE | INTRAVENOUS | Status: DC | PRN
  Administered 2022-03-11: 50 mg via INTRAVENOUS

## 2022-03-11 MED ORDER — GADOBUTROL 1 MMOL/ML IV SOLN
8.0000 mL | Freq: Once | INTRAVENOUS | Status: AC | PRN
Start: 2022-03-11 — End: 2022-03-11
  Administered 2022-03-11: 8 mL via INTRAVENOUS

## 2022-03-11 MED ORDER — SERTRALINE HCL 100 MG PO TABS
100.0000 mg | ORAL_TABLET | Freq: Every day | ORAL | Status: DC
  Administered 2022-03-12 – 2022-03-13 (×2): 100 mg via ORAL
  Filled 2022-03-11 (×2): qty 1

## 2022-03-11 MED ORDER — DEXAMETHASONE SODIUM PHOSPHATE 10 MG/ML IJ SOLN
INTRAMUSCULAR | Status: AC
Start: 2022-03-11 — End: ?
  Filled 2022-03-11: qty 1

## 2022-03-11 MED ORDER — SODIUM CHLORIDE 0.9 % IV SOLN: INTRAVENOUS | Status: DC

## 2022-03-11 MED ORDER — ONDANSETRON HCL 4 MG/2ML IJ SOLN
INTRAMUSCULAR | Status: AC
Start: 2022-03-11 — End: ?
  Filled 2022-03-11: qty 2

## 2022-03-11 MED ORDER — FENTANYL CITRATE (PF) 100 MCG/2ML IJ SOLN
INTRAMUSCULAR | Status: AC
  Filled 2022-03-11: qty 2

## 2022-03-11 MED ORDER — FENTANYL CITRATE (PF) 100 MCG/2ML IJ SOLN
25.0000 ug | INTRAMUSCULAR | Status: DC | PRN
  Administered 2022-03-11 (×4): 50 ug via INTRAVENOUS

## 2022-03-11 MED ORDER — ALBUTEROL SULFATE (2.5 MG/3ML) 0.083% IN NEBU
2.5000 mg | INHALATION_SOLUTION | RESPIRATORY_TRACT | Status: DC | PRN
Start: 2022-03-11 — End: 2022-03-14

## 2022-03-11 MED ORDER — ONDANSETRON HCL 4 MG/2ML IJ SOLN
INTRAMUSCULAR | Status: DC | PRN
  Administered 2022-03-11: 4 mg via INTRAVENOUS

## 2022-03-11 MED ORDER — POLYETHYLENE GLYCOL 3350 17 G PO PACK: 17.0000 g | PACK | Freq: Every day | ORAL | Status: DC | PRN

## 2022-03-11 MED ORDER — FERROUS SULFATE 325 (65 FE) MG PO TABS
325.0000 mg | ORAL_TABLET | Freq: Every day | ORAL | Status: DC
  Administered 2022-03-12 – 2022-03-13 (×2): 325 mg via ORAL
  Filled 2022-03-11 (×2): qty 1

## 2022-03-11 MED ORDER — LIDOCAINE 2% (20 MG/ML) 5 ML SYRINGE
INTRAMUSCULAR | Status: AC
Start: 2022-03-11 — End: ?
  Filled 2022-03-11: qty 5

## 2022-03-11 MED ORDER — DOCUSATE SODIUM 100 MG PO CAPS
100.0000 mg | ORAL_CAPSULE | Freq: Two times a day (BID) | ORAL | Status: DC
  Administered 2022-03-11 – 2022-03-14 (×6): 100 mg via ORAL
  Filled 2022-03-11 (×7): qty 1

## 2022-03-11 MED ORDER — MONTELUKAST SODIUM 10 MG PO TABS
10.0000 mg | ORAL_TABLET | Freq: Every day | ORAL | Status: DC
  Administered 2022-03-12 – 2022-03-13 (×2): 10 mg via ORAL
  Filled 2022-03-11 (×2): qty 1

## 2022-03-11 MED ORDER — ORAL CARE MOUTH RINSE: 15.0000 mL | OROMUCOSAL | Status: DC | PRN

## 2022-03-11 MED ORDER — SODIUM CHLORIDE 0.9% FLUSH
3.0000 mL | Freq: Two times a day (BID) | INTRAVENOUS | Status: DC
  Administered 2022-03-11 – 2022-03-14 (×6): 3 mL via INTRAVENOUS

## 2022-03-11 MED ORDER — PROPOFOL 10 MG/ML IV BOLUS
INTRAVENOUS | Status: DC | PRN
  Administered 2022-03-11: 100 mg via INTRAVENOUS

## 2022-03-11 MED ORDER — DEXAMETHASONE SODIUM PHOSPHATE 4 MG/ML IJ SOLN: 4.0000 mg | Freq: Once | INTRAMUSCULAR | Status: DC

## 2022-03-11 MED ORDER — DEXAMETHASONE SODIUM PHOSPHATE 10 MG/ML IJ SOLN
INTRAMUSCULAR | Status: DC | PRN
  Administered 2022-03-11: 5 mg via INTRAVENOUS

## 2022-03-11 MED ORDER — FENTANYL CITRATE PF 50 MCG/ML IJ SOSY
50.0000 ug | PREFILLED_SYRINGE | Freq: Once | INTRAMUSCULAR | Status: AC
  Administered 2022-03-11: 50 ug via INTRAVENOUS
  Filled 2022-03-11: qty 1

## 2022-03-11 MED ORDER — DEXAMETHASONE SODIUM PHOSPHATE 4 MG/ML IJ SOLN
4.0000 mg | Freq: Four times a day (QID) | INTRAMUSCULAR | Status: DC
  Administered 2022-03-11 – 2022-03-14 (×13): 4 mg via INTRAVENOUS
  Filled 2022-03-11 (×13): qty 1

## 2022-03-11 MED ORDER — ACETAMINOPHEN 650 MG RE SUPP: 650.0000 mg | Freq: Four times a day (QID) | RECTAL | Status: DC | PRN

## 2022-03-11 MED ORDER — CEFAZOLIN SODIUM-DEXTROSE 2-4 GM/100ML-% IV SOLN
INTRAVENOUS | Status: AC
  Filled 2022-03-11: qty 100

## 2022-03-11 MED ORDER — CEFAZOLIN SODIUM-DEXTROSE 1-4 GM/50ML-% IV SOLN
1.0000 g | Freq: Three times a day (TID) | INTRAVENOUS | Status: AC
  Administered 2022-03-11 – 2022-03-12 (×3): 1 g via INTRAVENOUS
  Filled 2022-03-11 (×3): qty 50

## 2022-03-11 MED ORDER — ACETAMINOPHEN 325 MG PO TABS
650.0000 mg | ORAL_TABLET | Freq: Four times a day (QID) | ORAL | Status: DC | PRN
  Administered 2022-03-12 – 2022-03-13 (×2): 650 mg via ORAL
  Filled 2022-03-11 (×3): qty 2

## 2022-03-11 MED ORDER — ATORVASTATIN CALCIUM 40 MG PO TABS
40.0000 mg | ORAL_TABLET | Freq: Every day | ORAL | Status: DC
Start: 2022-03-11 — End: 2022-03-14
  Administered 2022-03-11 – 2022-03-13 (×3): 40 mg via ORAL
  Filled 2022-03-11 (×3): qty 1

## 2022-03-11 MED ORDER — HYDROMORPHONE HCL 1 MG/ML IJ SOLN
0.5000 mg | INTRAMUSCULAR | Status: DC | PRN
  Administered 2022-03-11: 1 mg via INTRAVENOUS
  Administered 2022-03-11: 0.5 mg via INTRAVENOUS
  Administered 2022-03-11 (×2): 1 mg via INTRAVENOUS
  Filled 2022-03-11 (×4): qty 1

## 2022-03-11 MED ORDER — DEXAMETHASONE SODIUM PHOSPHATE 4 MG/ML IJ SOLN
4.0000 mg | Freq: Once | INTRAMUSCULAR | Status: AC
  Administered 2022-03-11: 4 mg via INTRAVENOUS
  Filled 2022-03-11: qty 1

## 2022-03-11 MED ORDER — HYDRALAZINE HCL 20 MG/ML IJ SOLN
10.0000 mg | INTRAMUSCULAR | Status: DC | PRN
  Administered 2022-03-11 – 2022-03-12 (×2): 10 mg via INTRAVENOUS
  Filled 2022-03-11 (×2): qty 1

## 2022-03-11 MED ORDER — FLEET ENEMA 7-19 GM/118ML RE ENEM: 1.0000 | ENEMA | Freq: Once | RECTAL | Status: DC | PRN

## 2022-03-11 MED ORDER — SUGAMMADEX SODIUM 200 MG/2ML IV SOLN
INTRAVENOUS | Status: DC | PRN
  Administered 2022-03-11: 200 mg via INTRAVENOUS

## 2022-03-11 MED ORDER — GABAPENTIN 300 MG PO CAPS
300.0000 mg | ORAL_CAPSULE | Freq: Every day | ORAL | Status: DC
  Administered 2022-03-11 – 2022-03-13 (×3): 300 mg via ORAL
  Filled 2022-03-11 (×3): qty 1

## 2022-03-11 MED ORDER — BISACODYL 10 MG RE SUPP: 10.0000 mg | Freq: Every day | RECTAL | Status: DC | PRN

## 2022-03-11 MED ORDER — LIDOCAINE 2% (20 MG/ML) 5 ML SYRINGE
INTRAMUSCULAR | Status: DC | PRN
  Administered 2022-03-11: 60 mg via INTRAVENOUS

## 2022-03-11 MED ORDER — VITAMIN D 25 MCG (1000 UNIT) PO TABS
1000.0000 [IU] | ORAL_TABLET | Freq: Every day | ORAL | Status: DC
  Administered 2022-03-12 – 2022-03-13 (×2): 1000 [IU] via ORAL
  Filled 2022-03-11 (×2): qty 1

## 2022-03-11 MED ORDER — INSULIN ASPART 100 UNIT/ML IJ SOLN
0.0000 [IU] | Freq: Three times a day (TID) | INTRAMUSCULAR | Status: DC
  Administered 2022-03-11: 3 [IU] via SUBCUTANEOUS
  Administered 2022-03-11: 8 [IU] via SUBCUTANEOUS
  Administered 2022-03-11: 3 [IU] via SUBCUTANEOUS
  Administered 2022-03-11: 5 [IU] via SUBCUTANEOUS
  Administered 2022-03-12: 8 [IU] via SUBCUTANEOUS
  Administered 2022-03-12: 3 [IU] via SUBCUTANEOUS
  Administered 2022-03-12: 5 [IU] via SUBCUTANEOUS
  Administered 2022-03-13 (×2): 8 [IU] via SUBCUTANEOUS
  Administered 2022-03-13 – 2022-03-14 (×3): 5 [IU] via SUBCUTANEOUS
  Administered 2022-03-14: 8 [IU] via SUBCUTANEOUS

## 2022-03-11 MED ORDER — VITAMIN B-12 1000 MCG PO TABS
1000.0000 ug | ORAL_TABLET | Freq: Every day | ORAL | Status: DC
  Administered 2022-03-12 – 2022-03-14 (×3): 1000 ug via ORAL
  Filled 2022-03-11 (×4): qty 1

## 2022-03-11 MED ORDER — THROMBIN (RECOMBINANT) 5000 UNITS EX SOLR
CUTANEOUS | Status: DC | PRN
  Administered 2022-03-11: 10 mL via TOPICAL

## 2022-03-11 MED ORDER — ALBUTEROL SULFATE (2.5 MG/3ML) 0.083% IN NEBU: 3.0000 mL | INHALATION_SOLUTION | Freq: Four times a day (QID) | RESPIRATORY_TRACT | Status: DC | PRN

## 2022-03-11 MED ORDER — ONDANSETRON HCL 4 MG/2ML IJ SOLN: 4.0000 mg | Freq: Four times a day (QID) | INTRAMUSCULAR | Status: DC | PRN

## 2022-03-11 MED ORDER — SODIUM CHLORIDE 0.9% FLUSH: 3.0000 mL | INTRAVENOUS | Status: DC | PRN

## 2022-03-11 MED ORDER — ONDANSETRON HCL 4 MG PO TABS: 4.0000 mg | ORAL_TABLET | Freq: Four times a day (QID) | ORAL | Status: DC | PRN

## 2022-03-11 MED ORDER — PANTOPRAZOLE SODIUM 40 MG PO TBEC
40.0000 mg | DELAYED_RELEASE_TABLET | Freq: Every day | ORAL | Status: DC
Start: 2022-03-11 — End: 2022-03-14
  Administered 2022-03-11 – 2022-03-14 (×4): 40 mg via ORAL
  Filled 2022-03-11 (×4): qty 1

## 2022-03-11 MED ORDER — CHLORHEXIDINE GLUCONATE 0.12 % MT SOLN: 15.0000 mL | Freq: Once | OROMUCOSAL | Status: DC

## 2022-03-11 MED ORDER — THROMBIN 5000 UNITS EX SOLR
OROMUCOSAL | Status: DC | PRN
  Administered 2022-03-11: 5 mL via TOPICAL

## 2022-03-11 MED ORDER — FENTANYL CITRATE (PF) 250 MCG/5ML IJ SOLN
INTRAMUSCULAR | Status: DC | PRN
  Administered 2022-03-11: 50 ug via INTRAVENOUS
  Administered 2022-03-11 (×2): 25 ug via INTRAVENOUS

## 2022-03-11 MED ORDER — ORAL CARE MOUTH RINSE: 15.0000 mL | Freq: Once | OROMUCOSAL | Status: DC

## 2022-03-11 MED ORDER — HYDRALAZINE HCL 20 MG/ML IJ SOLN
INTRAMUSCULAR | Status: AC
  Administered 2022-03-11: 5 mg via INTRAVENOUS
  Filled 2022-03-11: qty 1

## 2022-03-11 MED ORDER — BUPIVACAINE HCL (PF) 0.25 % IJ SOLN
INTRAMUSCULAR | Status: DC | PRN
  Administered 2022-03-11: 10 mL

## 2022-03-11 MED ORDER — CHLORHEXIDINE GLUCONATE 0.12 % MT SOLN
OROMUCOSAL | Status: AC
  Administered 2022-03-11: 15 mL
  Filled 2022-03-11: qty 15

## 2022-03-11 MED ORDER — GLIPIZIDE ER 2.5 MG PO TB24
2.5000 mg | ORAL_TABLET | Freq: Every morning | ORAL | Status: DC
  Administered 2022-03-12 – 2022-03-14 (×3): 2.5 mg via ORAL
  Filled 2022-03-11 (×5): qty 1

## 2022-03-11 MED ORDER — OXYCODONE HCL 5 MG PO TABS
5.0000 mg | ORAL_TABLET | ORAL | Status: DC | PRN
  Administered 2022-03-11 – 2022-03-14 (×4): 5 mg via ORAL
  Filled 2022-03-11 (×4): qty 1

## 2022-03-11 MED ORDER — CEFAZOLIN SODIUM-DEXTROSE 2-4 GM/100ML-% IV SOLN
2.0000 g | Freq: Once | INTRAVENOUS | Status: AC
  Administered 2022-03-11: 2 g via INTRAVENOUS

## 2022-03-11 MED ORDER — FENTANYL CITRATE (PF) 250 MCG/5ML IJ SOLN
INTRAMUSCULAR | Status: AC
  Filled 2022-03-11: qty 5

## 2022-03-11 SURGICAL SUPPLY — 56 items
ADH SKN CLS APL DERMABOND .7 (GAUZE/BANDAGES/DRESSINGS) ×1
APL SKNCLS STERI-STRIP NONHPOA (GAUZE/BANDAGES/DRESSINGS) ×1
BAG COUNTER SPONGE SURGICOUNT (BAG) ×1 IMPLANT
BAG DECANTER FOR FLEXI CONT (MISCELLANEOUS) ×1 IMPLANT
BAG SPNG CNTER NS LX DISP (BAG) ×2
BAND INSRT 18 STRL LF DISP RB (MISCELLANEOUS) ×2
BAND RUBBER #18 3X1/16 STRL (MISCELLANEOUS) IMPLANT
BENZOIN TINCTURE PRP APPL 2/3 (GAUZE/BANDAGES/DRESSINGS) ×1 IMPLANT
BLADE CLIPPER SURG (BLADE) IMPLANT
CANISTER SUCT 3000ML PPV (MISCELLANEOUS) ×1 IMPLANT
CARTRIDGE OIL MAESTRO DRILL (MISCELLANEOUS) ×1 IMPLANT
DERMABOND ADVANCED (GAUZE/BANDAGES/DRESSINGS) ×1
DERMABOND ADVANCED .7 DNX12 (GAUZE/BANDAGES/DRESSINGS) ×1 IMPLANT
DIFFUSER DRILL AIR PNEUMATIC (MISCELLANEOUS) ×1 IMPLANT
DRAIN JACKSON PRT FLT 7MM (DRAIN) IMPLANT
DRAPE LAPAROTOMY 100X72X124 (DRAPES) ×1 IMPLANT
DRAPE MICROSCOPE LEICA 54X105 (DRAPES) IMPLANT
DRAPE SURG 17X23 STRL (DRAPES) IMPLANT
DRSG OPSITE POSTOP 4X6 (GAUZE/BANDAGES/DRESSINGS) IMPLANT
ELECT REM PT RETURN 9FT ADLT (ELECTROSURGICAL) ×1
ELECTRODE REM PT RTRN 9FT ADLT (ELECTROSURGICAL) ×1 IMPLANT
EVACUATOR 1/8 PVC DRAIN (DRAIN) IMPLANT
GAUZE 4X4 16PLY ~~LOC~~+RFID DBL (SPONGE) IMPLANT
GAUZE SPONGE 4X4 12PLY STRL (GAUZE/BANDAGES/DRESSINGS) ×1 IMPLANT
GLOVE BIO SURGEON STRL SZ 6.5 (GLOVE) IMPLANT
GLOVE BIO SURGEON STRL SZ7 (GLOVE) IMPLANT
GLOVE BIOGEL PI IND STRL 6.5 (GLOVE) IMPLANT
GLOVE BIOGEL PI IND STRL 7.0 (GLOVE) IMPLANT
GLOVE BIOGEL PI INDICATOR 6.5 (GLOVE) ×1
GLOVE BIOGEL PI INDICATOR 7.0 (GLOVE) ×1
GLOVE ECLIPSE 9.0 STRL (GLOVE) ×1 IMPLANT
GLOVE EXAM NITRILE XL STR (GLOVE) IMPLANT
GOWN STRL REUS W/ TWL LRG LVL3 (GOWN DISPOSABLE) IMPLANT
GOWN STRL REUS W/ TWL XL LVL3 (GOWN DISPOSABLE) ×1 IMPLANT
GOWN STRL REUS W/TWL 2XL LVL3 (GOWN DISPOSABLE) IMPLANT
GOWN STRL REUS W/TWL LRG LVL3 (GOWN DISPOSABLE) ×2
GOWN STRL REUS W/TWL XL LVL3 (GOWN DISPOSABLE) ×1
KIT BASIN OR (CUSTOM PROCEDURE TRAY) ×1 IMPLANT
KIT TURNOVER KIT B (KITS) ×1 IMPLANT
NDL HYPO 25X1 1.5 SAFETY (NEEDLE) IMPLANT
NEEDLE HYPO 25X1 1.5 SAFETY (NEEDLE) ×1 IMPLANT
NS IRRIG 1000ML POUR BTL (IV SOLUTION) ×1 IMPLANT
OIL CARTRIDGE MAESTRO DRILL (MISCELLANEOUS) ×1
PACK LAMINECTOMY NEURO (CUSTOM PROCEDURE TRAY) ×1 IMPLANT
SPONGE SURGIFOAM ABS GEL SZ50 (HEMOSTASIS) ×1 IMPLANT
STRIP CLOSURE SKIN 1/2X4 (GAUZE/BANDAGES/DRESSINGS) ×1 IMPLANT
SUT VIC AB 0 CT1 18XCR BRD8 (SUTURE) ×1 IMPLANT
SUT VIC AB 0 CT1 8-18 (SUTURE) ×1
SUT VIC AB 2-0 CT1 18 (SUTURE) ×1 IMPLANT
SUT VIC AB 3-0 SH 8-18 (SUTURE) IMPLANT
SWAB COLLECTION DEVICE MRSA (MISCELLANEOUS) ×1 IMPLANT
SWAB CULTURE ESWAB REG 1ML (MISCELLANEOUS) ×1 IMPLANT
SYR BULB IRRIG 60ML STRL (SYRINGE) IMPLANT
TOWEL GREEN STERILE (TOWEL DISPOSABLE) ×1 IMPLANT
TOWEL GREEN STERILE FF (TOWEL DISPOSABLE) ×1 IMPLANT
WATER STERILE IRR 1000ML POUR (IV SOLUTION) ×1 IMPLANT

## 2022-03-11 NOTE — Plan of Care (Signed)
  Problem: Education: Goal: Knowledge of General Education information will improve Description: Including pain rating scale, medication(s)/side effects and non-pharmacologic comfort measures Outcome: Progressing   Problem: Health Behavior/Discharge Planning: Goal: Ability to manage health-related needs will improve Outcome: Progressing   Problem: Clinical Measurements: Goal: Ability to maintain clinical measurements within normal limits will improve Outcome: Progressing Goal: Will remain free from infection Outcome: Progressing Goal: Diagnostic test results will improve Outcome: Progressing Goal: Cardiovascular complication will be avoided Outcome: Progressing   Problem: Elimination: Goal: Will not experience complications related to bowel motility Outcome: Progressing Goal: Will not experience complications related to urinary retention Outcome: Progressing   Problem: Safety: Goal: Ability to remain free from injury will improve Outcome: Progressing   Problem: Skin Integrity: Goal: Risk for impaired skin integrity will decrease Outcome: Progressing   Problem: Education: Goal: Ability to describe self-care measures that may prevent or decrease complications (Diabetes Survival Skills Education) will improve Outcome: Progressing Goal: Individualized Educational Video(s) Outcome: Progressing   Problem: Fluid Volume: Goal: Ability to maintain a balanced intake and output will improve Outcome: Progressing   Problem: Health Behavior/Discharge Planning: Goal: Ability to identify and utilize available resources and services will improve Outcome: Progressing Goal: Ability to manage health-related needs will improve Outcome: Progressing   Problem: Metabolic: Goal: Ability to maintain appropriate glucose levels will improve Outcome: Progressing   Problem: Nutritional: Goal: Progress toward achieving an optimal weight will improve Outcome: Progressing   Problem: Skin  Integrity: Goal: Risk for impaired skin integrity will decrease Outcome: Progressing   Problem: Tissue Perfusion: Goal: Adequacy of tissue perfusion will improve Outcome: Progressing

## 2022-03-11 NOTE — ED Notes (Addendum)
Attempted to ambulate patient again at this time, unsuccessful due to pt pain level in L leg. MD made aware.

## 2022-03-11 NOTE — ED Provider Notes (Signed)
I assumed care of this patient.  Please see previous provider note for further details of Hx, PE.  Briefly patient is a 81 y.o. female who presented with back pain following MI laminectomy 2 days ago. NSU was already consulted and requested decadron dosing and reassessment.  After 1st dose there was no improvement. 2nd dose given and still having significant pain.  NSU will admit for further work up and management.       Fatima Blank, MD 03/11/22 [id number]

## 2022-03-11 NOTE — Progress Notes (Signed)
PT Cancellation Note  Patient Details Name: Shaleigh Laubscher MRN: [id number] DOB: [birth year]   Cancelled Treatment:    Reason Eval/Treat Not Completed: Patient at procedure or test/unavailable.  Patient undergoing MRI to rule out hematoma.  Will attempt later today/tommorrow once results available.   Shanna Cisco 03/11/2022, 10:57 AM

## 2022-03-11 NOTE — Evaluation (Signed)
OT Cancellation Note  Patient Details Name: Trini Soldo MRN: [id number] DOB: [birth year]   Cancelled Treatment:    Reason Eval/Treat Not Completed: Patient at procedure or test/ unavailable Patient off floor at time of evaluation. OT will follow back as time permits.   Corinne Ports E. Renesmae Donahey, OTR/L Acute Rehabilitation Services [phone]   Ascencion Dike 03/11/2022, 12:56 PM

## 2022-03-11 NOTE — ED Notes (Signed)
Pt. Still very weak at this time. MD made aware.

## 2022-03-11 NOTE — ED Notes (Signed)
SBAR Report sent to April, RN at this time.

## 2022-03-11 NOTE — Progress Notes (Signed)
PHARMACY NOTE:  ANTIMICROBIAL RENAL DOSAGE ADJUSTMENT  Current antimicrobial regimen includes a mismatch between antimicrobial dosage and estimated renal function.  As per policy approved by the Pharmacy & Therapeutics and Medical Executive Committees, the antimicrobial dosage will be adjusted accordingly.  Current antimicrobial dosage:  ancef 2gm IV x3 doses  Indication: Surgical propphylaxis  Renal Function:  Estimated Creatinine Clearance: 28.8 mL/min (A) (by C-G formula based on SCr of 1.59 mg/dL (H)). []      On intermittent HD, scheduled: []      On CRRT    Antimicrobial dosage has been changed to:  Ancef 1gm IV q8h  Additional comments:   Thank you for allowing pharmacy to be a part of this patient's care.  Hildred Laser, PharmD Clinical Pharmacist **Pharmacist phone directory can now be found on [email or web address] ([email or web address]).  Listed under [place].

## 2022-03-11 NOTE — ED Notes (Signed)
Attempted to ambulate patient again at this time. Patient is still very unstable on her feet at this time with 2x assist. MD made aware, planning for admission. Pt and daughter made aware.

## 2022-03-11 NOTE — H&P (Signed)
Teresa Daniels is an 81 y.o. female.   Chief Complaint: Post op pain HPI: Teresa Daniels underwent an uncomplicated minimally invasive L4-5 laminectomy by Dr. Zada Finders on 03/09/2022. She was discharged on 03/10/2022. Once home, the patient began to have increasing pain in bilateral lower extremities. Her symptoms started around 8 pm last night. The pain started in her right foot and progressed to her right knee. Her left knee began hurting as well and then she developed bilateral gluteal pain. Her daughter was concerned she might have developed DVTs in her lower extremities. She was brought in to [facility] Emergency Department for further evaluation. In the ED, venous ultrasounds ruled out DVTs. She was given 5 mg oxycodone PO and 6 mg IV decadron. She was unable to ambulate due to pain. An additional dose of 4 mg IV decadron was given, still with no relief. Teresa Daniels was transferred to [facility] for admission and further work up. The patient presently rates her pain a 6/10. The pain is in bilateral lower extremities and is very limiting to her mobility. She denies numbness, tingling, or weakness.  Past Medical History:  Diagnosis Date   Asthma    CKD (chronic kidney disease) stage 4, GFR 15-29 ml/min (HCC)    Depression    Diabetes mellitus without complication ([place])    Hypertension    Stroke ([place])    2019 per patient    Past Surgical History:  Procedure Laterality Date   BREAST LUMPECTOMY Left    CESAREAN SECTION     x 1   COLONOSCOPY     LUMBAR LAMINECTOMY/ DECOMPRESSION WITH MET-RX N/A 03/09/2022   Procedure: Lumbar Four-Five Minimally Invasive Laminectomy/Metrx;  Surgeon: Judith Part, MD;  Location: [place];  Service: Neurosurgery;  Laterality: N/A;  3C/RM 20 to follow   ORIF ANKLE FRACTURE Right 02/10/2021   Procedure: OPEN REDUCTION INTERNAL FIXATION (ORIF) ANKLE FRACTURE;  Surgeon: Shona Needles, MD;  Location: [place];  Service: Orthopedics;  Laterality: Right;    No  family history on file. Social History:  reports that she quit smoking about 54 years ago. Her smoking use included cigarettes. She has never used smokeless tobacco. She reports that she does not currently use alcohol. She reports that she does not use drugs.  Allergies:  Allergies  Allergen Reactions   Cat Hair Extract Other (See Comments)    Runny nose    Medications Prior to Admission  Medication Sig Dispense Refill   acetaminophen (TYLENOL) 325 MG tablet Take 2 tablets (650 mg total) by mouth every 6 (six) hours as needed for mild pain (or Fever >/= 101).     albuterol (VENTOLIN HFA) 108 (90 Base) MCG/ACT inhaler Inhale 2 puffs into the lungs every 6 (six) hours as needed for wheezing or shortness of breath.     atorvastatin (LIPITOR) 40 MG tablet Take 40 mg by mouth at bedtime.     beclomethasone (QVAR) 40 MCG/ACT inhaler Inhale 1 puff into the lungs 2 (two) times daily as needed (shortness of breath).     cetirizine (ZYRTEC) 10 MG tablet Take 10 mg by mouth daily.     Cholecalciferol (VITAMIN D-3) 25 MCG (1000 UT) CAPS Take 1,000 Units by mouth daily after supper.     clopidogrel (PLAVIX) 75 MG tablet Take 1 tablet (75 mg total) by mouth in the morning. Restart on 03/12/22     diclofenac Sodium (VOLTAREN) 1 % GEL Apply 2 g topically daily as needed (pain).  ENDOCET 2.5-325 MG tablet Take 1 tablet by mouth at bedtime.     ferrous sulfate 325 (65 FE) MG tablet Take 325 mg by mouth daily after supper.     Fish Oil-Krill Oil (KRILL & FISH OIL BLEND PO) Take 1 capsule by mouth daily after breakfast.     gabapentin (NEURONTIN) 300 MG capsule Take 300 mg by mouth daily after supper.     glipiZIDE (GLUCOTROL XL) 2.5 MG 24 hr tablet Take 2.5 mg by mouth in the morning.     hydrochlorothiazide (HYDRODIURIL) 25 MG tablet Take 25 mg by mouth daily.     losartan (COZAAR) 50 MG tablet Take 50 mg by mouth daily.     MAGNESIUM PO Take 250 mg by mouth daily.      montelukast (SINGULAIR) 10 MG  tablet Take 10 mg by mouth daily after supper.     pantoprazole (PROTONIX) 40 MG tablet Take 40 mg by mouth daily before breakfast.     sertraline (ZOLOFT) 100 MG tablet Take 100 mg by mouth daily after supper.     vitamin B-12 (CYANOCOBALAMIN) 1000 MCG tablet Take 1,000 mcg by mouth daily.      Results for orders placed or performed during the hospital encounter of 03/10/22 (from the past 48 hour(s))  CBC with Differential     Status: Abnormal   Collection Time: 03/11/22 12:42 AM  Result Value Ref Range   WBC 7.9 4.0 - 10.5 K/uL   RBC 2.73 (L) 3.87 - 5.11 MIL/uL   Hemoglobin 9.4 (L) 12.0 - 15.0 g/dL   HCT 27.6 (L) 36.0 - 46.0 %   MCV 101.1 (H) 80.0 - 100.0 fL   MCH 34.4 (H) 26.0 - 34.0 pg   MCHC 34.1 30.0 - 36.0 g/dL   RDW 12.5 11.5 - 15.5 %   Platelets 178 150 - 400 K/uL   nRBC 0.0 0.0 - 0.2 %   Neutrophils Relative % 77 %   Neutro Abs 6.0 1.7 - 7.7 K/uL   Lymphocytes Relative 13 %   Lymphs Abs 1.1 0.7 - 4.0 K/uL   Monocytes Relative 9 %   Monocytes Absolute 0.7 0.1 - 1.0 K/uL   Eosinophils Relative 1 %   Eosinophils Absolute 0.1 0.0 - 0.5 K/uL   Basophils Relative 0 %   Basophils Absolute 0.0 0.0 - 0.1 K/uL   Immature Granulocytes 0 %   Abs Immature Granulocytes 0.02 0.00 - 0.07 K/uL    Comment: Performed at [facility], [address] [place], [place] [address]  Basic metabolic panel     Status: Abnormal   Collection Time: 03/11/22 12:42 AM  Result Value Ref Range   Sodium 135 135 - 145 mmol/L   Potassium 4.1 3.5 - 5.1 mmol/L   Chloride 99 98 - 111 mmol/L   CO2 26 22 - 32 mmol/L   Glucose, Bld 296 (H) 70 - 99 mg/dL    Comment: Glucose reference range applies only to samples taken after fasting for at least 8 hours.   BUN 40 (H) 8 - 23 mg/dL   Creatinine, Ser 1.59 (H) 0.44 - 1.00 mg/dL   Calcium 9.0 8.9 - 10.3 mg/dL   GFR, Estimated 33 (L) >60 mL/min    Comment: (NOTE) Calculated using the CKD-EPI Creatinine Equation (2021)    Anion gap 10 5 -  15    Comment: Performed at [facility], [address] [place], [place] [address]   US Venous Img Lower Bilateral  Result Date: 03/10/2022 CLINICAL DATA:  Bilateral leg swelling EXAM: BILATERAL LOWER EXTREMITY VENOUS DOPPLER ULTRASOUND TECHNIQUE: Gray-scale sonography with compression, as well as color and duplex ultrasound, were performed to evaluate the deep venous system(s) from the level of the common femoral vein through the popliteal and proximal calf veins. COMPARISON:  None Available. FINDINGS: VENOUS Normal compressibility of the common femoral, superficial femoral, and popliteal veins, as well as the visualized calf veins. Visualized portions of profunda femoral vein and great saphenous vein unremarkable. No filling defects to suggest DVT on grayscale or color Doppler imaging. Doppler waveforms show normal direction of venous flow, normal respiratory plasticity and response to augmentation. OTHER Moderate subcutaneous edema within the lower extremities bilaterally Limitations: none IMPRESSION: Negative. Electronically Signed   By: Fidela Salisbury M.D.   On: 03/10/2022 23:01   DG Chest Port 1 View  Result Date: 03/10/2022 CLINICAL DATA:  Shortness of breath. EXAM: PORTABLE CHEST 1 VIEW COMPARISON:  02/06/2021. FINDINGS: The heart size and mediastinal contours are within normal limits. Interstitial prominence is noted bilaterally. Stable apical pleural scarring on the right. No consolidation, effusion, or pneumothorax. No acute osseous abnormality. IMPRESSION: Interstitial prominence bilaterally which may be chronic versus related to edema or infection. Electronically Signed   By: Brett Fairy M.D.   On: 03/10/2022 22:00   DG Lumbar Spine 2-3 Views  Result Date: 03/09/2022 CLINICAL DATA:  Lumbar 4 5 laminectomy. EXAM: LUMBAR SPINE - 2-3 VIEW COMPARISON:  MRI lumbar spine 09/22/2021 FINDINGS: Intraoperative lumbar spine. 1 low resolution intraoperative spot views of the  lumbar spine were obtained. No fracture visible on the limited views. Total fluoroscopy time: 9.6 seconds Total radiation dose: 5.56 micro Gy IMPRESSION: Intraoperative lumbar spine. Electronically Signed   By: Ronney Asters M.D.   On: 03/09/2022 20:13    Review of Systems  Constitutional:  Positive for activity change. Negative for chills and fever.  HENT: Negative.    Eyes: Negative.   Respiratory: Negative.    Cardiovascular: Negative.   Gastrointestinal: Negative.   Endocrine: Negative.   Genitourinary: Negative.   Musculoskeletal:  Positive for back pain, gait problem and myalgias.  Skin: Negative.   Allergic/Immunologic: Negative.   Neurological:  Negative for dizziness, speech difficulty, weakness and numbness.       The patient reports sharp pain following the L5 distribution bilaterally.  Hematological: Negative.   Psychiatric/Behavioral: Negative.      Blood pressure (!) 176/84, pulse 78, temperature 98.6 F (37 C), temperature source Oral, resp. rate 19, height 5' 4"  (1.626 m), weight 79.4 kg, SpO2 100 %. Physical Exam Constitutional:      General: She is in acute distress.  HENT:     Head: Normocephalic and atraumatic.  Eyes:     Extraocular Movements: Extraocular movements intact.     Pupils: Pupils are equal, round, and reactive to light.  Cardiovascular:     Rate and Rhythm: Normal rate and regular rhythm.  Pulmonary:     Effort: Tachypnea present. No respiratory distress.  Abdominal:     Palpations: Abdomen is soft.     Tenderness: There is no abdominal tenderness.  Musculoskeletal:        General: Normal range of motion.     Cervical back: Normal range of motion and neck supple.  Skin:    General: Skin is warm and dry.     Capillary Refill: Capillary refill takes less than 2 seconds.       Neurological:     Mental Status:  She is alert and oriented to person, place, and time. Mental status is at baseline.     Motor: Motor function is intact.      Coordination: Coordination is intact.      Assessment/Plan Teresa Daniels developed intractable pain one day status post minimally invasive L4-5 laminectomy. A lumbar MRI has been ordered to further assess for a post operative hematoma. IV decadron has been ordered for presumed inflammation. Teresa Daniels will be kept NPO for now.   Patricia Nettle, NP 03/11/2022, 6:11 AM

## 2022-03-11 NOTE — Brief Op Note (Signed)
03/11/2022  2:25 PM  PATIENT:  Teresa Daniels  81 y.o. female  PRE-OPERATIVE DIAGNOSIS:  Lumbar Epidural Hematoma  POST-OPERATIVE DIAGNOSIS:  Lumbar Epidural Hematoma  PROCEDURE:  Procedure(s): Exploration of Lumbar Surgical Wound (N/A)  SURGEON:  Surgeon(s) and Role:    Earnie Larsson, MD - Primary  PHYSICIAN ASSISTANT:   ASSISTANTSMearl Latin   ANESTHESIA:   general  EBL:  50 mL   BLOOD ADMINISTERED:none  DRAINS: (25mm) Jackson-Pratt drain(s) with closed bulb suction in the deep wound space    LOCAL MEDICATIONS USED:  MARCAINE     SPECIMEN:  No Specimen  DISPOSITION OF SPECIMEN:  N/A  COUNTS:  YES  TOURNIQUET:  * No tourniquets in log *  DICTATION: .Dragon Dictation  PLAN OF CARE: Admit to inpatient   PATIENT DISPOSITION:  PACU - hemodynamically stable.   Delay start of Pharmacological VTE agent (>24hrs) due to surgical blood loss or risk of bleeding: yes

## 2022-03-11 NOTE — Transfer of Care (Signed)
Immediate Anesthesia Transfer of Care Note  Patient: Teresa Daniels  Procedure(s) Performed: Exploration of Lumbar Surgical Wound (Back)  Patient Location: PACU  Anesthesia Type:General  Level of Consciousness: awake and alert   Airway & Oxygen Therapy: Patient Spontanous Breathing and Patient connected to face mask oxygen  Post-op Assessment: Report given to RN and Post -op Vital signs reviewed and stable  Post vital signs: Reviewed and stable  Last Vitals:  Vitals Value Taken Time  BP 188/103 03/11/22 1439  Temp    Pulse 81 03/11/22 1443  Resp 16   SpO2 99 % 03/11/22 1443  Vitals shown include unvalidated device data.  Last Pain:  Vitals:   03/11/22 1100  TempSrc: Oral  PainSc:       Patients Stated Pain Goal: 2 (44/62/86 [id number])  Complications: No notable events documented.

## 2022-03-11 NOTE — Progress Notes (Signed)
Patient admitted with severe postoperative pain radiating to both lower extremities.  MRI scan demonstrates postoperative fluid collection which is compressing the thecal sac consistent with acute postoperative hemorrhage.  Plan to return to the operating room for reexploration of lumbar wound with evacuation of hematoma.  Risks and benefits explained.  Patient wishes to proceed.

## 2022-03-11 NOTE — ED Notes (Signed)
[facility] called for transport to [place]

## 2022-03-11 NOTE — Anesthesia Procedure Notes (Signed)
Procedure Name: Intubation Date/Time: 03/11/2022 1:36 PM  Performed by: Lorie Phenix, CRNAPre-anesthesia Checklist: Patient identified, Emergency Drugs available, Suction available and Patient being monitored Patient Re-evaluated:Patient Re-evaluated prior to induction Oxygen Delivery Method: Circle system utilized Preoxygenation: Pre-oxygenation with 100% oxygen Induction Type: IV induction Ventilation: Mask ventilation without difficulty and Oral airway inserted - appropriate to patient size Laryngoscope Size: Mac and 3 Grade View: Grade I Tube type: Oral Tube size: 7.5 mm Number of attempts: 1 Airway Equipment and Method: Stylet Placement Confirmation: ETT inserted through vocal cords under direct vision, positive ETCO2 and breath sounds checked- equal and bilateral Secured at: 22 cm Tube secured with: Tape Dental Injury: Teeth and Oropharynx as per pre-operative assessment

## 2022-03-11 NOTE — Anesthesia Preprocedure Evaluation (Addendum)
Anesthesia Evaluation  Patient identified by MRN, date of birth, ID band Patient awake    Reviewed: Allergy & Precautions, H&P , NPO status , Patient's Chart, lab work & pertinent test results  Airway Mallampati: I  TM Distance: >3 FB Neck ROM: Full    Dental no notable dental hx. (+) Teeth Intact, Dental Advisory Given   Pulmonary asthma , former smoker,    Pulmonary exam normal breath sounds clear to auscultation       Cardiovascular hypertension, Pt. on medications  Rhythm:Regular Rate:Normal     Neuro/Psych Depression CVA, Residual Symptoms    GI/Hepatic negative GI ROS, Neg liver ROS,   Endo/Other  diabetes, Type 2, Oral Hypoglycemic Agents  Renal/GU Renal InsufficiencyRenal disease  negative genitourinary   Musculoskeletal   Abdominal   Peds  Hematology  (+) Blood dyscrasia, anemia ,   Anesthesia Other Findings   Reproductive/Obstetrics negative OB ROS                           Anesthesia Physical Anesthesia Plan  ASA: 3  Anesthesia Plan: General   Post-op Pain Management: Ofirmev IV (intra-op)*   Induction: Intravenous  PONV Risk Score and Plan: 4 or greater and Ondansetron, Dexamethasone and Treatment may vary due to age or medical condition  Airway Management Planned: Oral ETT  Additional Equipment:   Intra-op Plan:   Post-operative Plan: Extubation in OR  Informed Consent: I have reviewed the patients History and Physical, chart, labs and discussed the procedure including the risks, benefits and alternatives for the proposed anesthesia with the patient or authorized representative who has indicated his/her understanding and acceptance.     Dental advisory given  Plan Discussed with: CRNA  Anesthesia Plan Comments:        Anesthesia Quick Evaluation

## 2022-03-11 NOTE — Op Note (Signed)
Date of procedure: 03/11/2022  Date of dictation: Same  Service: Neurosurgery  Preoperative diagnosis: Postoperative lumbar epidural hemorrhage with intractable pain.  Postoperative diagnosis: Name  Procedure Name: Reexploration of lumbar laminectomy with evacuation of hemorrhage  Surgeon:Henry A.Pool, M.D.  Asst. Surgeon: Bergman, NP  Anesthesia: General  Indication: 80-year-old female recently status post lumbar decompressive surgery.  She presents now with intractable back pain with radiation down both lower extremities.  MRI scan demonstrates evidence of postoperative compressive fluid collection consistent with hematoma.  Patient presents now for reexploration and evacuation of hematoma.  Operative note: After induction of anesthesia, patient position prone onto Wilson frame and properly padded.  Lumbar region prepped and draped sterilely.  Lumbar wound was reopened and extended slightly both cephalad and caudally.  Upon opening the skin I was met with liquefied hematoma under pressure which spontaneously drained.  I then proceeded through the previous dissection plane down to the lamina of L4 and 5.  Retractor was placed.  I evacuated all visible hematoma.  The thecal sac appeared welled decompressed.  There is no evidence of any CSF leakage.  There were a few areas of paraspinal bleeding which were controlled using cautery.  Wound was then irrigated.  A JP drain was left in the deep wound space.  Wound was then closed in layers of Vicryl sutures.  Steri-Strips and sterile dressing were applied.  No apparent complications.  Patient tolerated the procedure well and she returns to the recovery room postop.  

## 2022-03-11 NOTE — Progress Notes (Signed)
PT Cancellation Note  Patient Details Name: Baily Hovanec MRN: [id number] DOB: 1940/12/02   Cancelled Treatment:    Reason Eval/Treat Not Completed: Patient at procedure or test/unavailable.  Patient returned to OR for additional surgery. Will hold until further instructions.     Shanna Cisco 03/11/2022, 1:25 PM

## 2022-03-11 NOTE — Progress Notes (Signed)
Dr. Deatra Canter stated not to activate diabetes hyperglycemia protocol. The floor nurse had checked pt. Blood sugar and covered her at 1227 with 5 units of novolog.

## 2022-03-11 NOTE — ED Notes (Signed)
Purewick placed at this time.

## 2022-03-11 NOTE — Anesthesia Postprocedure Evaluation (Signed)
Anesthesia Post Note  Patient: Teresa Daniels  Procedure(s) Performed: Exploration of Lumbar Surgical Wound (Back)     Patient location during evaluation: PACU Anesthesia Type: General Level of consciousness: awake and alert Pain management: pain level controlled Vital Signs Assessment: post-procedure vital signs reviewed and stable Respiratory status: spontaneous breathing, nonlabored ventilation, respiratory function stable and patient connected to face mask oxygen Cardiovascular status: blood pressure returned to baseline and stable Postop Assessment: no apparent nausea or vomiting Anesthetic complications: no   No notable events documented.  Last Vitals:  Vitals:   03/11/22 1545 03/11/22 1643  BP: (!) 201/86 (!) 168/75  Pulse: (!) 106 80  Resp: 13 16  Temp:  36.8 C  SpO2:  100%    Last Pain:  Vitals:   03/11/22 1643  TempSrc: Axillary  PainSc:                  Teresa Daniels,W. EDMOND

## 2022-03-12 ENCOUNTER — Encounter (HOSPITAL_COMMUNITY): Payer: Self-pay | Admitting: Neurosurgery

## 2022-03-12 LAB — GLUCOSE, CAPILLARY
Glucose-Capillary: 267 mg/dL — ABNORMAL HIGH (ref 70–99)
Glucose-Capillary: 190 mg/dL — ABNORMAL HIGH (ref 70–99)
Glucose-Capillary: 216 mg/dL — ABNORMAL HIGH (ref 70–99)
Glucose-Capillary: 199 mg/dL — ABNORMAL HIGH (ref 70–99)

## 2022-03-12 MED ORDER — CHLORHEXIDINE GLUCONATE CLOTH 2 % EX PADS
6.0000 | MEDICATED_PAD | Freq: Every day | CUTANEOUS | Status: DC
  Administered 2022-03-13 – 2022-03-14 (×2): 6 via TOPICAL

## 2022-03-12 NOTE — Progress Notes (Signed)
[facility] provider-on-call Viona Gilmore, NP) paged @0450  via answering service to convey the following to attending:  1)At 0430 Neuro assessment pt endorses severely decreased sensation below her knees only minimally sensing nailbed pressure (previously pt endorsed mild tingling in legs). 2)JP drain has only had 46mL of drainage all night, however surgical site has been draining moderately. 3)Pt endorses new onset double vision.  Provider promptly returned paged and endorsed she would convey findings to MD on-call, Dr. Annette Stable. No new orders presently received but provider endorsed she may place orders for additional imaging pending her discussion with Dr. Annette Stable. Will continue to monitor and assess.

## 2022-03-12 NOTE — Progress Notes (Signed)
Subjective: Patient reports  doing better this morning improved pain  Objective: Vital signs in last 24 hours: Temp:  [97.4 F (36.3 C)-98.6 F (37 C)] 97.9 F (36.6 C) (09/03 0731) Pulse Rate:  [68-106] 87 (09/03 0731) Resp:  [9-20] 17 (09/03 0731) BP: (129-210)/(59-103) 135/63 (09/03 0731) SpO2:  [83 %-100 %] 93 % (09/03 0731)  Intake/Output from previous day: 09/02 0701 - 09/03 0700 In: 1092.8 [I.V.:910.7; IV Piggyback:182.1] Out: 615 [Urine:550; Drains:15; Blood:50] Intake/Output this shift: No intake/output data recorded.  Awake and alert wound dry strength 5 out of 5  Lab Results: Recent Labs    03/09/22 1239 03/11/22 0042  WBC 7.1 7.9  HGB 10.4* 9.4*  HCT 30.6* 27.6*  PLT 198 178   BMET Recent Labs    03/09/22 1239 03/11/22 0042  NA 141 135  K 3.8 4.1  CL 104 99  CO2 27 26  GLUCOSE 153* 296*  BUN 35* 40*  CREATININE 1.67* 1.59*  CALCIUM 9.1 9.0    Studies/Results: MR Lumbar Spine W Wo Contrast  Result Date: 03/11/2022 CLINICAL DATA:  81 year old female with pain and confusion. Postoperative day 2 status post lumbar spine surgery. EXAM: MRI LUMBAR SPINE WITHOUT AND WITH CONTRAST TECHNIQUE: Multiplanar and multiecho pulse sequences of the lumbar spine were obtained without and with intravenous contrast. CONTRAST:  51mL GADAVIST GADOBUTROL 1 MMOL/ML IV SOLN COMPARISON:  Intraoperative images [id number].  Lumbar MRI 09/22/2021. FINDINGS: Segmentation: Normal on 2019 radiographs, the same numbering system used on the MRI earlier this year. Alignment: Stable. Grade 1 anterolisthesis of L4 on L5 with straightening of lordosis, mild retrolisthesis of L3 on L4. Vertebrae: Left laminectomy at L4-L5. Chronic but increased L4-L5 marrow edema and enhancement, eccentric to the right. See additional details of that level below. Background bone marrow signal is stable, within normal limits. Intact visible sacrum. Conus medullaris and cauda equina: Conus extends to the T12-L1 level.  No lower spinal cord or conus signal abnormality. Motion degraded axial T2 weighted imaging throughout the lumbar spine despite repeated imaging attempts. However, strong evidence of a new posterior intraspinal fluid collection tracking from the lumbosacral junction cephalad to the conus (see series 6, image 8). Associated posterior dural thickening. Subsequent mass effect on the posterior thecal sac and displaced cauda equina nerve roots ventrally including at L1-L2. See individual lumbar details below. Paraspinal and other soft tissues: Postoperative changes to the left para spinal soft tissues at L4-L5 with small volume fluid collection tracking through the left laminectomy space. Mild regional bilateral soft tissue edema including in the paraspinal muscles. Partially visible dilated urinary bladder. No hydronephrosis or hydroureter. Disc levels: Motion degraded detail on axial T2. T12-L1:  Stable mild disc bulge. L1-L2: Stable aside from posterior intraspinal fluid collection, cauda equina nerve roots displaced anteriorly but mild if any spinal stenosis. L2-L3: Stable disc and posterior element degeneration. Posterior intraspinal fluid collection. Subsequent mild to moderate increased spinal stenosis. L3-L4: Advanced chronic disc and posterior element degeneration. Combined with posterior intraspinal fluid collection severe spinal stenosis is suspected. L4-L5: Postoperative changes. Fluid in the left laminectomy space might communicate with bilateral degenerative facet joint fluid now (series 8, image 32). Severe posterior mass effect on the thecal sac here related to postoperative fluid and also a small volume of postoperative gas suspected (series 9, image 32). Severe spinal stenosis. Also moderate new right L4 neural foraminal stenosis which appears related to progressed foraminal disc and facet disease. L5-S1: Loculated enhancement of the intraspinal fluid collection visible here (series 11,  image 36).  Subsequent severe new spinal stenosis. Underlying chronic disc and posterior element degeneration is stable. IMPRESSION: 1. Motion degraded axial T2 imaging, but strong evidence (series 6 image 8 and series 11 image 36) of an extensive new posterior intraspinal fluid collection extending from the lumbosacral junction cephalad to the conus. This could be CSF leak or hematoma/seroma. Subsequent mass effect on the posterior thecal sac and increased lumbar spinal stenosis at most levels since the preoperative exam - up to severe stenosis L3-L4 through L5-S1. 2. Recent left laminectomy at L4-L5. Residual spinal stenosis as in #1. And moderate right L4 neural foraminal stenosis appears increased since March due to right foraminal disc and facet disease. 3. Distended urinary bladder. Electronically Signed   By: Genevie Ann M.D.   On: 03/11/2022 11:23   US Venous Img Lower Bilateral  Result Date: 03/10/2022 CLINICAL DATA:  Bilateral leg swelling EXAM: BILATERAL LOWER EXTREMITY VENOUS DOPPLER ULTRASOUND TECHNIQUE: Gray-scale sonography with compression, as well as color and duplex ultrasound, were performed to evaluate the deep venous system(s) from the level of the common femoral vein through the popliteal and proximal calf veins. COMPARISON:  None Available. FINDINGS: VENOUS Normal compressibility of the common femoral, superficial femoral, and popliteal veins, as well as the visualized calf veins. Visualized portions of profunda femoral vein and great saphenous vein unremarkable. No filling defects to suggest DVT on grayscale or color Doppler imaging. Doppler waveforms show normal direction of venous flow, normal respiratory plasticity and response to augmentation. OTHER Moderate subcutaneous edema within the lower extremities bilaterally Limitations: none IMPRESSION: Negative. Electronically Signed   By: Fidela Salisbury M.D.   On: 03/10/2022 23:01   DG Chest Port 1 View  Result Date: 03/10/2022 CLINICAL DATA:  Shortness  of breath. EXAM: PORTABLE CHEST 1 VIEW COMPARISON:  02/06/2021. FINDINGS: The heart size and mediastinal contours are within normal limits. Interstitial prominence is noted bilaterally. Stable apical pleural scarring on the right. No consolidation, effusion, or pneumothorax. No acute osseous abnormality. IMPRESSION: Interstitial prominence bilaterally which may be chronic versus related to edema or infection. Electronically Signed   By: Brett Fairy M.D.   On: 03/10/2022 22:00    Assessment/Plan: Postop day 1 evacuation of lumbar for dural hematoma doing better mobilize with physical and Occupational Therapy  LOS: 1 day     Elaina Hoops 03/12/2022, 9:05 AM

## 2022-03-12 NOTE — Evaluation (Signed)
Physical Therapy Evaluation Patient Details Name: Teresa Daniels MRN: [id number] DOB: 01-06-1941 Today's Date: 03/12/2022  History of Present Illness  The pt is an 81 yo female presenting 9/1 with new onset pain in bilateral LE and SOB. Originally s/p L4-5 laminectomy on 8/31, d/c home 9/1. S/p exploration of lumbar laminecotmy with evacuation of hematoma on 9/2. PMH includes: asthma, CKD IV, depression, DM II, HTN, and stroke.   Clinical Impression  Pt in bed upon arrival of PT, agreeable to evaluation at this time. Prior to admission the pt was ambulating with use of rollator, reports no recent falls, living in independent living facility and is independent with ADLs. The pt now presents with limitations in functional mobility, strength, power, activity tolerance, and dynamic stability due to above dx and resulting pain, and will continue to benefit from skilled PT to address these deficits. She required modA to complete bed mobility, but only minA to complete short bout of ambulation in room with RW. The pt reports she is limited by pain in mobility progression, but was smiling and laughing with therapists through session with no physiological indications of pain. Will continue to progress mobility as tolerated, will need a few more acute PT sessions prior to d/c back to independent living facility.         Recommendations for follow up therapy are one component of a multi-disciplinary discharge planning process, led by the attending physician.  Recommendations may be updated based on patient status, additional functional criteria and insurance authorization.  Follow Up Recommendations Home health PT      Assistance Recommended at Discharge Frequent or constant Supervision/Assistance  Patient can return home with the following  A little help with walking and/or transfers;A little help with bathing/dressing/bathroom;Assistance with feeding;Assistance with cooking/housework;Direct  supervision/assist for medications management;Direct supervision/assist for financial management;Assist for transportation;Help with stairs or ramp for entrance    Equipment Recommendations None recommended by PT  Recommendations for Other Services       Functional Status Assessment Patient has had a recent decline in their functional status and demonstrates the ability to make significant improvements in function in a reasonable and predictable amount of time.     Precautions / Restrictions Precautions Precautions: Back Precaution Booklet Issued: Yes (comment) Restrictions Weight Bearing Restrictions: No      Mobility  Bed Mobility Overal bed mobility: Needs Assistance Bed Mobility: Sidelying to Sit, Rolling Rolling: Mod assist Sidelying to sit: Mod assist, HOB elevated       General bed mobility comments: modA to complete with cues for technique    Transfers Overall transfer level: Needs assistance Equipment used: Rolling walker (2 wheels) Transfers: Sit to/from Stand Sit to Stand: Min assist, Min guard           General transfer comment: progressed from minA to minG, cues for hand placement    Ambulation/Gait Ambulation/Gait assistance: Min assist Gait Distance (Feet): 6 Feet Assistive device: Rolling walker (2 wheels) Gait Pattern/deviations: Step-through pattern, Decreased stride length, Trunk flexed Gait velocity: decreased Gait velocity interpretation: <1.31 ft/sec, indicative of household ambulator   General Gait Details: small steps with dependence on BUE support on RW. no overt LOB pt declines further ambulation due to fatigue    Balance Overall balance assessment: Needs assistance Sitting-balance support: Feet supported Sitting balance-Leahy Scale: Good     Standing balance support: Bilateral upper extremity supported, Reliant on assistive device for balance Standing balance-Leahy Scale: Poor Standing balance comment: dependent on BUE support  Pertinent Vitals/Pain Pain Assessment Pain Assessment: Faces Pain Score: 9  Faces Pain Scale: Hurts little more Breathing: normal Negative Vocalization: none Facial Expression: smiling or inexpressive Body Language: relaxed Consolability: no need to console PAINAD Score: 0 Pain Location: incision, R side Pain Descriptors / Indicators: Discomfort, Guarding Pain Intervention(s): Monitored during session, Limited activity within patient's tolerance, Repositioned, Premedicated before session    Home Living Family/patient expects to be discharged to:: Private residence Living Arrangements: Alone (independent living) Available Help at Discharge: Family;Available 24 hours/day;Available PRN/intermittently (daughter) Type of Home: Independent living facility Home Access: Level entry;Elevator       Home Layout: One level Home Equipment: Conservation officer, nature (2 wheels);Rollator (4 wheels);Cane - single point;Adaptive equipment      Prior Function Prior Level of Function : Needs assist             Mobility Comments: walks with rollator, no falls, had been active with HHPT and OT ADLs Comments: daughter does errands, active with HHOT     Hand Dominance   Dominant Hand: Right    Extremity/Trunk Assessment   Upper Extremity Assessment Upper Extremity Assessment: Defer to OT evaluation    Lower Extremity Assessment Lower Extremity Assessment: Overall WFL for tasks assessed (grossly 4/5 to MMT bilaterally with pt reporting mild numbness in RLE)    Cervical / Trunk Assessment Cervical / Trunk Assessment: Kyphotic  Communication   Communication: No difficulties  Cognition Arousal/Alertness: Awake/alert Behavior During Therapy: WFL for tasks assessed/performed, Anxious Overall Cognitive Status: Impaired/Different from baseline Area of Impairment: Attention, Following commands, Safety/judgement, Problem solving                    Current Attention Level: Selective   Following Commands: Follows multi-step commands with increased time Safety/Judgement: Decreased awareness of safety   Problem Solving: Slow processing, Requires verbal cues General Comments: pt needing max encouragement, but then able to follow cues. reprots 9/10 pain but was laughing and joking with therapists through session. self-limiting mobility due to fear of pain        General Comments General comments (skin integrity, edema, etc.): VSS oN RA    Exercises     Assessment/Plan    PT Assessment Patient needs continued PT services  PT Problem List Decreased strength;Decreased range of motion;Decreased activity tolerance;Decreased balance;Decreased mobility;Decreased coordination;Decreased cognition;Decreased safety awareness;Pain       PT Treatment Interventions DME instruction;Gait training;Stair training;Functional mobility training;Therapeutic activities;Therapeutic exercise;Balance training;Patient/family education    PT Goals (Current goals can be found in the Care Plan section)  Acute Rehab PT Goals Patient Stated Goal: return home PT Goal Formulation: With patient Time For Goal Achievement: 03/26/22 Potential to Achieve Goals: Good    Frequency Min 5X/week     Co-evaluation PT/OT/SLP Co-Evaluation/Treatment: Yes Reason for Co-Treatment: Necessary to address cognition/behavior during functional activity;For patient/therapist safety;To address functional/ADL transfers PT goals addressed during session: Mobility/safety with mobility;Balance;Proper use of DME         AM-PAC PT "6 Clicks" Mobility  Outcome Measure Help needed turning from your back to your side while in a flat bed without using bedrails?: A Lot Help needed moving from lying on your back to sitting on the side of a flat bed without using bedrails?: A Lot Help needed moving to and from a bed to a chair (including a wheelchair)?: A Little Help needed standing  up from a chair using your arms (e.g., wheelchair or bedside chair)?: A Little Help needed to walk in hospital room?: Total  Help needed climbing 3-5 steps with a railing? : A Lot 6 Click Score: 13    End of Session Equipment Utilized During Treatment: Gait belt;Back brace Activity Tolerance: Patient tolerated treatment well Patient left: in chair;with call bell/phone within reach;with chair alarm set;with family/visitor present;with nursing/sitter in room Nurse Communication: Mobility status PT Visit Diagnosis: Unsteadiness on feet (R26.81);Muscle weakness (generalized) (M62.81);Other abnormalities of gait and mobility (R26.89);Pain Pain - part of body:  (back)    Time: 3474-2595 PT Time Calculation (min) (ACUTE ONLY): 31 min   Charges:   PT Evaluation $PT Eval Moderate Complexity: 1 Mod          [place] Carbo, PT, DPT   Acute Rehabilitation Department  Sandra Cockayne 03/12/2022, 2:23 PM

## 2022-03-12 NOTE — Evaluation (Signed)
Occupational Therapy Evaluation Patient Details Name: Teresa Daniels MRN: [id number] DOB: 03-08-1941 Today's Date: 03/12/2022   History of Present Illness The pt is an 81 yo female presenting 9/1 with new onset pain in bilateral LE and SOB. Originally s/p L4-5 laminectomy on 8/31, d/c home 9/1. S/p exploration of lumbar laminecotmy with evacuation of hematoma on 9/2. PMH includes: asthma, CKD IV, depression, DM II, HTN, and stroke.   Clinical Impression   Pt admitted for concerns listed above. PTA pt reported  that she was independent with ADL's and functional mobility, prior to her spinal surgery. At this time, pt presents with increased pain, weakness, and balance deficits, requiring up to min A for ADL's and functional mobility, using a RW. Reviewed spinal precautions, however pt reported that her MD had told her and her daughter that she no longer had to follow them. Additionally, provided pt mod A for long roll technique with bed mobility. Recommending HHOT to maximize independence and safety. OT will follow acutely.       Recommendations for follow up therapy are one component of a multi-disciplinary discharge planning process, led by the attending physician.  Recommendations may be updated based on patient status, additional functional criteria and insurance authorization.   Follow Up Recommendations  Home health OT    Assistance Recommended at Discharge Intermittent Supervision/Assistance  Patient can return home with the following A little help with walking and/or transfers;A little help with bathing/dressing/bathroom;Assist for transportation;Assistance with cooking/housework;Direct supervision/assist for medications management    Functional Status Assessment  Patient has had a recent decline in their functional status and demonstrates the ability to make significant improvements in function in a reasonable and predictable amount of time.  Equipment Recommendations  None recommended  by OT    Recommendations for Other Services       Precautions / Restrictions Precautions Precautions: Back Precaution Booklet Issued: Yes (comment) Restrictions Weight Bearing Restrictions: No      Mobility Bed Mobility Overal bed mobility: Needs Assistance Bed Mobility: Sidelying to Sit, Rolling Rolling: Mod assist Sidelying to sit: Mod assist, HOB elevated       General bed mobility comments: modA to complete with cues for technique    Transfers Overall transfer level: Needs assistance Equipment used: Rolling walker (2 wheels) Transfers: Sit to/from Stand Sit to Stand: Min assist, Min guard           General transfer comment: progressed from minA to minG, cues for hand placement      Balance Overall balance assessment: Needs assistance Sitting-balance support: Feet supported Sitting balance-Leahy Scale: Good     Standing balance support: Bilateral upper extremity supported, Reliant on assistive device for balance Standing balance-Leahy Scale: Poor Standing balance comment: dependent on BUE support                           ADL either performed or assessed with clinical judgement   ADL Overall ADL's : Needs assistance/impaired Eating/Feeding: Set up;Sitting   Grooming: Set up;Sitting   Upper Body Bathing: Set up;Sitting   Lower Body Bathing: Minimal assistance;Sitting/lateral leans;Sit to/from stand   Upper Body Dressing : Set up;Sitting   Lower Body Dressing: Minimal assistance;Sitting/lateral leans;Sit to/from stand   Toilet Transfer: Min guard;Ambulation   Toileting- Clothing Manipulation and Hygiene: Minimal assistance;Sitting/lateral lean;Sit to/from stand       Functional mobility during ADLs: Min guard;Rolling walker (2 wheels)       Vision Baseline Vision/History: 1 Wears glasses  Ability to See in Adequate Light: 0 Adequate Patient Visual Report: No change from baseline Vision Assessment?: No apparent visual deficits      Perception     Praxis      Pertinent Vitals/Pain Pain Assessment Pain Assessment: Faces Faces Pain Scale: Hurts little more Breathing: normal Negative Vocalization: none Facial Expression: smiling or inexpressive Body Language: relaxed Consolability: no need to console PAINAD Score: 0 Pain Location: incision, R side Pain Descriptors / Indicators: Discomfort, Guarding Pain Intervention(s): Monitored during session, Repositioned, Premedicated before session     Hand Dominance Right   Extremity/Trunk Assessment Upper Extremity Assessment Upper Extremity Assessment: Generalized weakness   Lower Extremity Assessment Lower Extremity Assessment: Defer to PT evaluation   Cervical / Trunk Assessment Cervical / Trunk Assessment: Kyphotic   Communication Communication Communication: No difficulties   Cognition Arousal/Alertness: Awake/alert Behavior During Therapy: WFL for tasks assessed/performed, Anxious Overall Cognitive Status: Impaired/Different from baseline Area of Impairment: Attention, Following commands, Safety/judgement, Problem solving                   Current Attention Level: Selective   Following Commands: Follows multi-step commands with increased time Safety/Judgement: Decreased awareness of safety   Problem Solving: Slow processing, Requires verbal cues General Comments: pt needing max encouragement, but then able to follow cues. reprots 9/10 pain but was laughing and joking with therapists through session. self-limiting mobility due to fear of pain     General Comments  VSS on RA    Exercises     Shoulder Instructions      Home Living Family/patient expects to be discharged to:: Private residence Living Arrangements: Alone (independent living) Available Help at Discharge: Family;Available 24 hours/day;Available PRN/intermittently (daughter) Type of Home: Independent living facility Home Access: Level entry;Elevator     Home Layout: One  level     Bathroom Shower/Tub: Walk-in shower;Sponge bathes at baseline   Bathroom Toilet: Handicapped height Bathroom Accessibility: Yes   Home Equipment: Conservation officer, nature (2 wheels);Rollator (4 wheels);Cane - single point;Adaptive equipment Adaptive Equipment: Reacher        Prior Functioning/Environment Prior Level of Function : Needs assist             Mobility Comments: walks with rollator, no falls, had been active with HHPT and OT ADLs Comments: daughter does errands, active with HHOT        OT Problem List: Impaired balance (sitting and/or standing);Decreased activity tolerance;Decreased safety awareness      OT Treatment/Interventions: Self-care/ADL training;Therapeutic exercise;Energy conservation;DME and/or AE instruction;Therapeutic activities;Patient/family education;Balance training    OT Goals(Current goals can be found in the care plan section) Acute Rehab OT Goals Patient Stated Goal: To get stronger OT Goal Formulation: With patient Time For Goal Achievement: 03/26/22 Potential to Achieve Goals: Good  OT Frequency: Min 2X/week    Co-evaluation PT/OT/SLP Co-Evaluation/Treatment: Yes Reason for Co-Treatment: Necessary to address cognition/behavior during functional activity;For patient/therapist safety;To address functional/ADL transfers   OT goals addressed during session: ADL's and self-care      AM-PAC OT "6 Clicks" Daily Activity     Outcome Measure Help from another person eating meals?: A Little Help from another person taking care of personal grooming?: A Little Help from another person toileting, which includes using toliet, bedpan, or urinal?: A Little Help from another person bathing (including washing, rinsing, drying)?: A Little Help from another person to put on and taking off regular upper body clothing?: A Little Help from another person to put on and taking off regular  lower body clothing?: A Little 6 Click Score: 18   End of  Session Equipment Utilized During Treatment: Gait belt;Rolling walker (2 wheels) Nurse Communication: Mobility status  Activity Tolerance: Patient tolerated treatment well Patient left: in bed;with call bell/phone within reach;with family/visitor present  OT Visit Diagnosis: Unsteadiness on feet (R26.81)                Time: 8182-9937 OT Time Calculation (min): 31 min Charges:  OT General Charges $OT Visit: 1 Visit OT Evaluation $OT Eval Moderate Complexity: Crowder, OTR/L [facility], Acute OT  Zamariya Neal Elane Jobe Mutch 03/12/2022, 8:12 PM

## 2022-03-12 NOTE — Plan of Care (Signed)

## 2022-03-13 LAB — GLUCOSE, CAPILLARY
Glucose-Capillary: 279 mg/dL — ABNORMAL HIGH (ref 70–99)
Glucose-Capillary: 237 mg/dL — ABNORMAL HIGH (ref 70–99)
Glucose-Capillary: 261 mg/dL — ABNORMAL HIGH (ref 70–99)
Glucose-Capillary: 235 mg/dL — ABNORMAL HIGH (ref 70–99)

## 2022-03-13 NOTE — Progress Notes (Signed)
Physical Therapy Treatment Patient Details Name: Teresa Daniels MRN: [id number] DOB: [birth year] Today's Date: 03/13/2022   History of Present Illness The pt is an 81 yo female presenting 9/1 with new onset pain in bilateral LE and SOB. Originally s/p L4-5 laminectomy on 8/31, d/c home 9/1. S/p exploration of lumbar laminecotmy with evacuation of hematoma on 9/2. PMH includes: asthma, CKD IV, depression, DM II, HTN, and stroke.    PT Comments    Pt progressing well with post-op mobility. She was able to demonstrate transfers and ambulation with gross min guard assist to min assist and RW for support. Pt was educated on precautions, positioning recommendations, and appropriate activity progression. Will continue to follow.      Recommendations for follow up therapy are one component of a multi-disciplinary discharge planning process, led by the attending physician.  Recommendations may be updated based on patient status, additional functional criteria and insurance authorization.  Follow Up Recommendations  Home health PT     Assistance Recommended at Discharge Frequent or constant Supervision/Assistance  Patient can return home with the following A little help with walking and/or transfers;A little help with bathing/dressing/bathroom;Assistance with feeding;Assistance with cooking/housework;Direct supervision/assist for medications management;Direct supervision/assist for financial management;Assist for transportation;Help with stairs or ramp for entrance   Equipment Recommendations  None recommended by PT    Recommendations for Other Services       Precautions / Restrictions Precautions Precautions: Back Precaution Booklet Issued: Yes (comment) Restrictions Weight Bearing Restrictions: No     Mobility  Bed Mobility Overal bed mobility: Needs Assistance Bed Mobility: Sidelying to Sit, Rolling Rolling: Min guard Sidelying to sit: HOB elevated, Min guard       General bed  mobility comments: VC's for optimal log roll technique. Pt was able to complete with close guard but no assist.    Transfers Overall transfer level: Needs assistance Equipment used: Rolling walker (2 wheels) Transfers: Sit to/from Stand Sit to Stand: Min guard           General transfer comment: Increased time to power up to full stand. No assist required but hands on guarding provided for safety.    Ambulation/Gait Ambulation/Gait assistance: Min assist Gait Distance (Feet): 100 Feet Assistive device: Rolling walker (2 wheels) Gait Pattern/deviations: Step-through pattern, Decreased stride length, Trunk flexed Gait velocity: decreased Gait velocity interpretation: <1.8 ft/sec, indicate of risk for recurrent falls   General Gait Details: Slow and guarded. VC's throughout for improved posture, closer walker proximity, and foward gaze. Min assist throughout mainly for walker management. No overt LOB.   Stairs             Wheelchair Mobility    Modified Rankin (Stroke Patients Only)       Balance Overall balance assessment: Needs assistance Sitting-balance support: Feet supported Sitting balance-Leahy Scale: Good     Standing balance support: Bilateral upper extremity supported, Reliant on assistive device for balance Standing balance-Leahy Scale: Poor Standing balance comment: dependent on BUE support                            Cognition Arousal/Alertness: Awake/alert Behavior During Therapy: WFL for tasks assessed/performed Overall Cognitive Status: Within Functional Limits for tasks assessed                                          Exercises  General Comments        Pertinent Vitals/Pain Pain Assessment Pain Assessment: Faces Faces Pain Scale: Hurts a little bit Pain Location: Incision site Pain Descriptors / Indicators: Discomfort, Guarding, Operative site guarding Pain Intervention(s): Limited activity within  patient's tolerance, Monitored during session, Repositioned    Home Living                          Prior Function            PT Goals (current goals can now be found in the care plan section) Acute Rehab PT Goals Patient Stated Goal: Home ASAP PT Goal Formulation: With patient Time For Goal Achievement: 03/26/22 Potential to Achieve Goals: Good Progress towards PT goals: Progressing toward goals    Frequency    Min 5X/week      PT Plan Current plan remains appropriate    Co-evaluation              AM-PAC PT "6 Clicks" Mobility   Outcome Measure  Help needed turning from your back to your side while in a flat bed without using bedrails?: A Little Help needed moving from lying on your back to sitting on the side of a flat bed without using bedrails?: A Little Help needed moving to and from a bed to a chair (including a wheelchair)?: A Little Help needed standing up from a chair using your arms (e.g., wheelchair or bedside chair)?: A Little Help needed to walk in hospital room?: A Little Help needed climbing 3-5 steps with a railing? : A Little 6 Click Score: 18    End of Session Equipment Utilized During Treatment: Gait belt;Back brace Activity Tolerance: Patient tolerated treatment well Patient left: in chair;with call bell/phone within reach;with chair alarm set;with family/visitor present Nurse Communication: Mobility status PT Visit Diagnosis: Unsteadiness on feet (R26.81);Muscle weakness (generalized) (M62.81);Other abnormalities of gait and mobility (R26.89);Pain Pain - part of body:  (back)     Time: 1610-9604 PT Time Calculation (min) (ACUTE ONLY): 29 min  Charges:  $Gait Training: 23-37 mins                     Teresa Daniels, PT, DPT Acute Rehabilitation Services Secure Chat Preferred Office: [phone]    Teresa Daniels 03/13/2022, 1:59 PM

## 2022-03-13 NOTE — Progress Notes (Signed)
Subjective: Patient reports  patient feels better with less pain did take some steps with physical therapy yesterday  Objective: Vital signs in last 24 hours: Temp:  [97.9 F (36.6 C)-99 F (37.2 C)] 98 F (36.7 C) (09/04 0750) Pulse Rate:  [66-75] 66 (09/04 0750) Resp:  [15-20] 15 (09/04 0750) BP: (115-158)/(58-97) 158/82 (09/04 0750) SpO2:  [92 %-98 %] 92 % (09/04 0750)  Intake/Output from previous day: 09/03 0701 - 09/04 0700 In: 903.6 [I.V.:825.8; IV Piggyback:77.8] Out: 1505 [Urine:1450; Drains:55] Intake/Output this shift: No intake/output data recorded.  Strength 5 out of 5 incision clean dry and intact discontinued her JP drain  Lab Results: Recent Labs    03/11/22 0042  WBC 7.9  HGB 9.4*  HCT 27.6*  PLT 178   BMET Recent Labs    03/11/22 0042  NA 135  K 4.1  CL 99  CO2 26  GLUCOSE 296*  BUN 40*  CREATININE 1.59*  CALCIUM 9.0    Studies/Results: MR Lumbar Spine W Wo Contrast  Result Date: 03/11/2022 CLINICAL DATA:  81 year old female with pain and confusion. Postoperative day 2 status post lumbar spine surgery. EXAM: MRI LUMBAR SPINE WITHOUT AND WITH CONTRAST TECHNIQUE: Multiplanar and multiecho pulse sequences of the lumbar spine were obtained without and with intravenous contrast. CONTRAST:  85mL GADAVIST GADOBUTROL 1 MMOL/ML IV SOLN COMPARISON:  Intraoperative images [id number].  Lumbar MRI 09/22/2021. FINDINGS: Segmentation: Normal on 2019 radiographs, the same numbering system used on the MRI earlier this year. Alignment: Stable. Grade 1 anterolisthesis of L4 on L5 with straightening of lordosis, mild retrolisthesis of L3 on L4. Vertebrae: Left laminectomy at L4-L5. Chronic but increased L4-L5 marrow edema and enhancement, eccentric to the right. See additional details of that level below. Background bone marrow signal is stable, within normal limits. Intact visible sacrum. Conus medullaris and cauda equina: Conus extends to the T12-L1 level. No lower spinal cord  or conus signal abnormality. Motion degraded axial T2 weighted imaging throughout the lumbar spine despite repeated imaging attempts. However, strong evidence of a new posterior intraspinal fluid collection tracking from the lumbosacral junction cephalad to the conus (see series 6, image 8). Associated posterior dural thickening. Subsequent mass effect on the posterior thecal sac and displaced cauda equina nerve roots ventrally including at L1-L2. See individual lumbar details below. Paraspinal and other soft tissues: Postoperative changes to the left para spinal soft tissues at L4-L5 with small volume fluid collection tracking through the left laminectomy space. Mild regional bilateral soft tissue edema including in the paraspinal muscles. Partially visible dilated urinary bladder. No hydronephrosis or hydroureter. Disc levels: Motion degraded detail on axial T2. T12-L1:  Stable mild disc bulge. L1-L2: Stable aside from posterior intraspinal fluid collection, cauda equina nerve roots displaced anteriorly but mild if any spinal stenosis. L2-L3: Stable disc and posterior element degeneration. Posterior intraspinal fluid collection. Subsequent mild to moderate increased spinal stenosis. L3-L4: Advanced chronic disc and posterior element degeneration. Combined with posterior intraspinal fluid collection severe spinal stenosis is suspected. L4-L5: Postoperative changes. Fluid in the left laminectomy space might communicate with bilateral degenerative facet joint fluid now (series 8, image 32). Severe posterior mass effect on the thecal sac here related to postoperative fluid and also a small volume of postoperative gas suspected (series 9, image 32). Severe spinal stenosis. Also moderate new right L4 neural foraminal stenosis which appears related to progressed foraminal disc and facet disease. L5-S1: Loculated enhancement of the intraspinal fluid collection visible here (series 11, image 36). Subsequent severe new  spinal stenosis. Underlying chronic disc and posterior element degeneration is stable. IMPRESSION: 1. Motion degraded axial T2 imaging, but strong evidence (series 6 image 8 and series 11 image 36) of an extensive new posterior intraspinal fluid collection extending from the lumbosacral junction cephalad to the conus. This could be CSF leak or hematoma/seroma. Subsequent mass effect on the posterior thecal sac and increased lumbar spinal stenosis at most levels since the preoperative exam - up to severe stenosis L3-L4 through L5-S1. 2. Recent left laminectomy at L4-L5. Residual spinal stenosis as in #1. And moderate right L4 neural foraminal stenosis appears increased since March due to right foraminal disc and facet disease. 3. Distended urinary bladder. Electronically Signed   By: Genevie Ann M.D.   On: 03/11/2022 11:23    Assessment/Plan: Continue to mobilize with physical Occupational Therapy now postop day 2.  LOS: 2 days     Elaina Hoops 03/13/2022, 8:54 AM

## 2022-03-13 NOTE — Plan of Care (Signed)

## 2022-03-13 NOTE — TOC Initial Note (Signed)
Transition of Care [facility]) - Initial/Assessment Note    Patient Details  Name: Teresa Daniels MRN: [id number] Date of Birth: 1941/06/17  [facility] CM/SW Contact:    Pollie Friar, RN Phone Number: 03/13/2022, 10:57 AM  Clinical Narrative:                 Pt is from [place]. She has all needed DME.  Daughter manages her medications and does all the needed transportation. She also checks on the patient almost daily.  Therapy is through [employer] at [facility]. CM has left a Advertising account executive for [employer].  TOC following.  Expected Discharge Plan: Home/Self Care Barriers to Discharge: Continued Medical Work up   Patient Goals and CMS Choice   CMS Medicare.gov Compare Post Acute Care list provided to:: Patient Choice offered to / list presented to : Patient, Adult Children  Expected Discharge Plan and Services Expected Discharge Plan: Home/Self Care   Discharge Planning Services: CM Consult Post Acute Care Choice: [place] arrangements for the past 2 months: Apartment                           HH Arranged: PT, OT   Date [place]: 03/13/22   Representative spoke with at [place]: Rollene Fare with [employer]  Prior Living Arrangements/Services Living arrangements for the past 2 months: Apartment Lives with:: Self Patient language and need for interpreter reviewed:: Yes Do you feel safe going back to the place where you live?: Yes        Care giver support system in place?: No (comment) Current home services: DME (rollator/ shower bars/ sh chair/ wheelchair/ walker/ cane/ elevated toilet) Criminal Activity/Legal Involvement Pertinent to Current Situation/Hospitalization: No - Comment as needed  Activities of Daily Living Home Assistive Devices/Equipment: CBG Meter, Walker (specify type), Eyeglasses, Hearing aid ADL Screening (condition at time of admission) Patient's cognitive ability adequate to safely complete daily  activities?: Yes Is the patient deaf or have difficulty hearing?: Yes Does the patient have difficulty seeing, even when wearing glasses/contacts?: No Does the patient have difficulty concentrating, remembering, or making decisions?: No Patient able to express need for assistance with ADLs?: Yes Does the patient have difficulty dressing or bathing?: No Independently performs ADLs?: Yes (appropriate for developmental age) Does the patient have difficulty walking or climbing stairs?: Yes Weakness of Legs: Both Weakness of Arms/Hands: Both  Permission Sought/Granted                  Emotional Assessment Appearance:: Appears stated age Attitude/Demeanor/Rapport: Engaged Affect (typically observed): Accepting Orientation: : Oriented to Self, Oriented to Place, Oriented to  Time, Oriented to Situation   Psych Involvement: No (comment)  Admission diagnosis:  Radicular pain [M54.10] Post-op pain [G89.18] H/O lumbosacral spine surgery [Z98.890] Patient Active Problem List   Diagnosis Date Noted   Post-op pain 03/11/2022   Lumbar stenosis with neurogenic claudication 03/09/2022   CAP (community acquired pneumonia) 02/06/2021   CKD (chronic kidney disease) stage 4, GFR 15-29 ml/min (HCC) 02/06/2021   Generalized weakness 02/06/2021   Fracture dislocation of right ankle 02/06/2021   COVID-19 08/08/2019   Acute respiratory failure with hypoxemia ([place]) 08/08/2019   History of CVA (cerebrovascular accident) 08/08/2019   Anemia 08/08/2019   CKD (chronic kidney disease), stage III ([place]) 08/08/2019   Dehydration    Vitamin B12 deficiency 02/10/2019   Unsteady gait 02/10/2019   Ischemic stroke ([place]) 12/06/2017  Diabetic polyneuropathy associated with type 2 diabetes mellitus ([place]) 12/06/2017   PCP:  [name], PA-C Pharmacy:   [facility] [id number] - [address], [place] [address] Phone: [phone] Fax:  [phone]     Social Determinants of Health (SDOH) Interventions    Readmission Risk Interventions     No data to display

## 2022-03-14 DIAGNOSIS — S064XAA Epidural hemorrhage with loss of consciousness status unknown, initial encounter: Secondary | ICD-10-CM | POA: Diagnosis present

## 2022-03-14 LAB — GLUCOSE, CAPILLARY
Glucose-Capillary: 258 mg/dL — ABNORMAL HIGH (ref 70–99)
Glucose-Capillary: 203 mg/dL — ABNORMAL HIGH (ref 70–99)
Glucose-Capillary: 202 mg/dL — ABNORMAL HIGH (ref 70–99)

## 2022-03-14 MED ORDER — CLOPIDOGREL BISULFATE 75 MG PO TABS: 75.0000 mg | ORAL_TABLET | Freq: Every morning | ORAL | Status: DC

## 2022-03-14 NOTE — Progress Notes (Signed)
Physical Therapy Treatment Patient Details Name: Teresa Daniels MRN: [id number] DOB: November 03, 1940 Today's Date: 03/14/2022   History of Present Illness The pt is an 81 yo female presenting 9/1 with new onset pain in bilateral LE and SOB. Originally s/p L4-5 laminectomy on 8/31, d/c home 9/1. S/p exploration of lumbar laminecotmy with evacuation of hematoma on 9/2. PMH includes: asthma, CKD IV, depression, DM II, HTN, and stroke.    PT Comments    Pt progressing towards physical therapy goals. Pt a little more stiff and sore this morning compared to yesterday's session, however motivated to work with PT and ambulated well in the hallway.  Reviewed precautions and set pt up with mesh briefs and a pad instead of the purewick to prepare pt for bathroom routine upon return home. Will continue to follow and progress as able per POC.    Recommendations for follow up therapy are one component of a multi-disciplinary discharge planning process, led by the attending physician.  Recommendations may be updated based on patient status, additional functional criteria and insurance authorization.  Follow Up Recommendations  Home health PT     Assistance Recommended at Discharge Frequent or constant Supervision/Assistance  Patient can return home with the following A little help with walking and/or transfers;A little help with bathing/dressing/bathroom;Assistance with feeding;Assistance with cooking/housework;Direct supervision/assist for medications management;Direct supervision/assist for financial management;Assist for transportation;Help with stairs or ramp for entrance   Equipment Recommendations  None recommended by PT    Recommendations for Other Services       Precautions / Restrictions Precautions Precautions: Back Precaution Booklet Issued: Yes (comment) Precaution Comments: Reviewed back precautions throughout functional mobility. Required Braces or Orthoses:  (No brace needed  order) Restrictions Weight Bearing Restrictions: No     Mobility  Bed Mobility Overal bed mobility: Needs Assistance Bed Mobility: Sidelying to Sit, Rolling Rolling: Min guard Sidelying to sit: Min assist       General bed mobility comments: VC's for optimal log roll technique. Min assist to elevate trunk to full sitting position.    Transfers Overall transfer level: Needs assistance Equipment used: Rolling walker (2 wheels) Transfers: Sit to/from Stand Sit to Stand: Min guard           General transfer comment: Increased time but pt was able to power up to full stand without assist. Pt demonstrated proper hand placement on seated surface for safety.    Ambulation/Gait Ambulation/Gait assistance: Min guard Gait Distance (Feet): 100 Feet Assistive device: Rolling walker (2 wheels) Gait Pattern/deviations: Step-through pattern, Decreased stride length, Trunk flexed Gait velocity: decreased Gait velocity interpretation: <1.8 ft/sec, indicate of risk for recurrent falls   General Gait Details: Slow and guarded. VC's throughout for improved posture, closer walker proximity, and foward gaze. No chair follow required today.   Stairs             Wheelchair Mobility    Modified Rankin (Stroke Patients Only)       Balance Overall balance assessment: Needs assistance Sitting-balance support: Feet supported Sitting balance-Leahy Scale: Good     Standing balance support: Bilateral upper extremity supported, Reliant on assistive device for balance Standing balance-Leahy Scale: Poor Standing balance comment: dependent on BUE support                            Cognition Arousal/Alertness: Awake/alert Behavior During Therapy: WFL for tasks assessed/performed Overall Cognitive Status: Within Functional Limits for tasks assessed  Exercises      General Comments        Pertinent  Vitals/Pain Pain Assessment Pain Assessment: 0-10 Pain Score: 5  Pain Location: Incision site Pain Descriptors / Indicators: Discomfort, Guarding, Operative site guarding Pain Intervention(s): Limited activity within patient's tolerance, Monitored during session, Repositioned    Home Living                          Prior Function            PT Goals (current goals can now be found in the care plan section) Acute Rehab PT Goals Patient Stated Goal: Home ASAP PT Goal Formulation: With patient Time For Goal Achievement: 03/26/22 Potential to Achieve Goals: Good Progress towards PT goals: Progressing toward goals    Frequency    Min 5X/week      PT Plan Current plan remains appropriate    Co-evaluation              AM-PAC PT "6 Clicks" Mobility   Outcome Measure  Help needed turning from your back to your side while in a flat bed without using bedrails?: A Little Help needed moving from lying on your back to sitting on the side of a flat bed without using bedrails?: A Little Help needed moving to and from a bed to a chair (including a wheelchair)?: A Little Help needed standing up from a chair using your arms (e.g., wheelchair or bedside chair)?: A Little Help needed to walk in hospital room?: A Little Help needed climbing 3-5 steps with a railing? : A Little 6 Click Score: 18    End of Session Equipment Utilized During Treatment: Gait belt;Back brace Activity Tolerance: Patient tolerated treatment well Patient left: in chair;with call bell/phone within reach;with chair alarm set;with family/visitor present Nurse Communication: Mobility status PT Visit Diagnosis: Unsteadiness on feet (R26.81);Muscle weakness (generalized) (M62.81);Other abnormalities of gait and mobility (R26.89);Pain Pain - part of body:  (back)     Time: 1245-8099 PT Time Calculation (min) (ACUTE ONLY): 25 min  Charges:  $Gait Training: 23-37 mins                     Teresa Daniels, PT, DPT Acute Rehabilitation Services Secure Chat Preferred Office: [phone]    Thelma Comp 03/14/2022, 9:46 AM

## 2022-03-14 NOTE — Care Management Important Message (Signed)
Important Message  Patient Details  Name: Teresa Daniels MRN: [id number] Date of Birth: 19-Aug-1940   Medicare Important Message Given:  Yes     Teresa Daniels 03/14/2022, 3:11 PM

## 2022-03-14 NOTE — Plan of Care (Signed)
  Problem: Education: Goal: Knowledge of General Education information will improve Description: Including pain rating scale, medication(s)/side effects and non-pharmacologic comfort measures Outcome: Adequate for Discharge   Problem: Health Behavior/Discharge Planning: Goal: Ability to manage health-related needs will improve Outcome: Adequate for Discharge   Problem: Clinical Measurements: Goal: Ability to maintain clinical measurements within normal limits will improve Outcome: Adequate for Discharge Goal: Will remain free from infection Outcome: Adequate for Discharge Goal: Diagnostic test results will improve Outcome: Adequate for Discharge Goal: Respiratory complications will improve Outcome: Adequate for Discharge Goal: Cardiovascular complication will be avoided Outcome: Adequate for Discharge   Problem: Activity: Goal: Risk for activity intolerance will decrease Outcome: Adequate for Discharge   Problem: Nutrition: Goal: Adequate nutrition will be maintained Outcome: Adequate for Discharge   Problem: Coping: Goal: Level of anxiety will decrease Outcome: Adequate for Discharge   Problem: Elimination: Goal: Will not experience complications related to bowel motility Outcome: Adequate for Discharge Goal: Will not experience complications related to urinary retention Outcome: Adequate for Discharge   Problem: Pain Managment: Goal: General experience of comfort will improve Outcome: Adequate for Discharge   Problem: Safety: Goal: Ability to remain free from injury will improve Outcome: Adequate for Discharge   Problem: Skin Integrity: Goal: Risk for impaired skin integrity will decrease Outcome: Adequate for Discharge   Problem: Education: Goal: Ability to describe self-care measures that may prevent or decrease complications (Diabetes Survival Skills Education) will improve Outcome: Adequate for Discharge Goal: Individualized Educational Video(s) Outcome:  Adequate for Discharge   Problem: Coping: Goal: Ability to adjust to condition or change in health will improve Outcome: Adequate for Discharge   Problem: Fluid Volume: Goal: Ability to maintain a balanced intake and output will improve Outcome: Adequate for Discharge   Problem: Health Behavior/Discharge Planning: Goal: Ability to identify and utilize available resources and services will improve Outcome: Adequate for Discharge Goal: Ability to manage health-related needs will improve Outcome: Adequate for Discharge   Problem: Metabolic: Goal: Ability to maintain appropriate glucose levels will improve Outcome: Adequate for Discharge   Problem: Nutritional: Goal: Maintenance of adequate nutrition will improve Outcome: Adequate for Discharge Goal: Progress toward achieving an optimal weight will improve Outcome: Adequate for Discharge   Problem: Skin Integrity: Goal: Risk for impaired skin integrity will decrease Outcome: Adequate for Discharge   Problem: Tissue Perfusion: Goal: Adequacy of tissue perfusion will improve Outcome: Adequate for Discharge   Problem: Acute Rehab PT Goals(only PT should resolve) Goal: Pt will Roll Supine to Side Outcome: Adequate for Discharge Goal: Pt Will Go Supine/Side To Sit Outcome: Adequate for Discharge Goal: Patient Will Transfer Sit To/From Stand Outcome: Adequate for Discharge Goal: Pt Will Go Up/Down Stairs Outcome: Adequate for Discharge Goal: Pt/caregiver will Perform Home Exercise Program Outcome: Adequate for Discharge

## 2022-03-14 NOTE — TOC Transition Note (Signed)
Transition of Care [facility]) - CM/SW Discharge Note   Patient Details  Name: Sydnee Lamour MRN: [id number] Date of Birth: 1941-01-01  [facility] CM/SW Contact:  Pollie Friar, RN Phone Number: 03/14/2022, 12:51 PM   Clinical Narrative:    The patient is discharging back to [place] with therapies through [employer]. Pt has been active with [facility] recently and CM has faxed the new orders for PT/OT to [facility] with [facility]: [phone]. Pts daughter to provide transport home.    Final next level of care: Home w Home Health Services Barriers to Discharge: No Barriers Identified   Patient Goals and CMS Choice   CMS Medicare.gov Compare Post Acute Care list provided to:: Patient Choice offered to / list presented to : Patient, Adult Children  Discharge Placement                       Discharge Plan and Services   Discharge Planning Services: CM Consult Post Acute Care Choice: Home Health                    HH Arranged: PT, OT   Date [facility] Contacted: 03/13/22   Representative spoke with at [facility]: Rollene Fare with [employer]  Social Determinants of Health ([place]) Interventions     Readmission Risk Interventions     No data to display

## 2022-03-14 NOTE — Discharge Summary (Signed)
Discharge Summary  Date of Admission: 03/10/2022  Date of Discharge: 03/14/22  Attending Physician: Emelda Brothers, MD  Hospital Course: Patient was readmitted after a recent MIS laminectomy with bilateral lower extremity pain, MRI showed a post-op epidural hematoma. She was taken to the OR on 03/11/22 for evacuation of the epidural with drain placement. Drain was removed on POD2, pt's symptoms resolved immediately post-op. Post-op course was otherwise unremarkable, pt was discharged home with home PT/OT on 03/14/22.   Neurologic exam at discharge:  Strength 5/5 x4 and SILTx4  Discharge diagnosis: Post-operative lumbar epidural hematoma  Judith Part, MD 03/14/22 10:19 AM

## 2022-03-14 NOTE — Progress Notes (Signed)
Neurosurgery Service Progress Note  Subjective: No acute events overnight, no radicular pain, back pain minimal, she states she feels much better than preop   Objective: Vitals:   03/14/22 0007 03/14/22 0504 03/14/22 0536 03/14/22 0819  BP: (!) 156/84 (!) 187/97 (!) 173/79 (!) 165/94  Pulse: 63 70 60 60  Resp: 19 19  18   Temp: 98.7 F (37.1 C) 98 F (36.7 C)  97.8 F (36.6 C)  TempSrc: Oral Oral  Oral  SpO2: 98% 98%  97%  Weight:      Height:        Physical Exam: Strength 5/5 x4 and SILTx4   Assessment & Plan: 81 y.o. woman s/p MIS lami c/b post-op epidural hematoma s/p evacuation with resolution of symptoms, recovering well.  -discharge home today with HHPT  Judith Part  03/14/22 10:13 AM

## 2022-03-23 MED FILL — Fentanyl Citrate Preservative Free (PF) Inj 100 MCG/2ML: INTRAMUSCULAR | Qty: 1 | Status: AC

## 2022-04-05 ENCOUNTER — Encounter (HOSPITAL_COMMUNITY): Payer: Self-pay | Admitting: Neurological Surgery

## 2022-04-05 ENCOUNTER — Other Ambulatory Visit: Payer: Self-pay | Admitting: Neurological Surgery

## 2022-04-05 ENCOUNTER — Other Ambulatory Visit: Payer: Self-pay

## 2022-04-05 NOTE — Progress Notes (Signed)
Pt's chart was just reviewed by Anesthesia last month. No need to review again, per Karoline Caldwell, APP.

## 2022-04-05 NOTE — Progress Notes (Addendum)
PCP - Orie Fisherman, PA-C Cardiologist - Dr Peter Martinique cleared for surgery Neurology - Dr Narda Amber  Chest x-ray - 03/10/22 EKG - 03/15/22 Stress Test - greater than 10 yrs ago ECHO - 02/21/22 Cardiac Cath - n/a  ICD Pacemaker/Loop - n/a  Sleep Study -  n/a CPAP - none  ERAS: Clear liquids til 10:20 AM DOS  Anesthesia review: Yes Jeneen Rinks, PA   Do not take glipizide on the morning of surgery.  If your blood sugar is less than 70 mg/dL, you will need to treat for low blood sugar: Treat a low blood sugar (less than 70 mg/dL) with  cup of clear juice (cranberry or apple), 4 glucose tablets, OR glucose gel. Recheck blood sugar in 15 minutes after treatment (to make sure it is greater than 70 mg/dL). If your blood sugar is not greater than 70 mg/dL on recheck, call [phone] for further instructions.  STOP now taking any Aspirin (unless otherwise instructed by your surgeon), Aleve, Naproxen, Ibuprofen, Motrin, Advil, Goody's, BC's, all herbal medications, fish oil, and all vitamins.   Coronavirus Screening Does the patient have any of the following symptoms:  Cough yes/no: No Fever (>100.56F)  yes/no: No Runny nose yes/no: No Sore throat yes/no: No Difficulty breathing/shortness of breath  yes/no: No  Has the patient traveled in the last 14 days and where? yes/no: No  Patient's Daughter Teresa Daniels used for PAT information & instructions for [employer].  Teresa Daniels verbalized understanding of instructions that were given via phone.

## 2022-04-06 ENCOUNTER — Other Ambulatory Visit: Payer: Self-pay

## 2022-04-06 ENCOUNTER — Observation Stay (HOSPITAL_COMMUNITY)
Admission: RE | Admit: 2022-04-06 | Discharge: 2022-04-07 | Disposition: A | Discharge status: Home / Self Care | Payer: Medicare PPO | Attending: Neurological Surgery | Admitting: Neurological Surgery

## 2022-04-06 ENCOUNTER — Ambulatory Visit (HOSPITAL_COMMUNITY): Payer: Medicare PPO | Admitting: Physician Assistant

## 2022-04-06 ENCOUNTER — Ambulatory Visit (HOSPITAL_BASED_OUTPATIENT_CLINIC_OR_DEPARTMENT_OTHER): Payer: Medicare PPO | Admitting: Physician Assistant

## 2022-04-06 ENCOUNTER — Encounter (HOSPITAL_COMMUNITY): Payer: Self-pay | Admitting: Neurological Surgery

## 2022-04-06 ENCOUNTER — Encounter (HOSPITAL_COMMUNITY)
Admission: RE | Disposition: A | Discharge status: Home / Self Care | Payer: Self-pay | Source: Home / Self Care | Attending: Neurological Surgery

## 2022-04-06 ENCOUNTER — Ambulatory Visit (HOSPITAL_COMMUNITY): Payer: Medicare PPO

## 2022-04-06 DIAGNOSIS — N184 Chronic kidney disease, stage 4 (severe): Secondary | ICD-10-CM | POA: Insufficient documentation

## 2022-04-06 DIAGNOSIS — W19XXXA Unspecified fall, initial encounter: Secondary | ICD-10-CM | POA: Insufficient documentation

## 2022-04-06 DIAGNOSIS — M4856XA Collapsed vertebra, not elsewhere classified, lumbar region, initial encounter for fracture: Secondary | ICD-10-CM | POA: Diagnosis not present

## 2022-04-06 DIAGNOSIS — I129 Hypertensive chronic kidney disease with stage 1 through stage 4 chronic kidney disease, or unspecified chronic kidney disease: Secondary | ICD-10-CM | POA: Insufficient documentation

## 2022-04-06 DIAGNOSIS — I10 Essential (primary) hypertension: Secondary | ICD-10-CM | POA: Diagnosis not present

## 2022-04-06 DIAGNOSIS — M8008XA Age-related osteoporosis with current pathological fracture, vertebra(e), initial encounter for fracture: Principal | ICD-10-CM | POA: Insufficient documentation

## 2022-04-06 DIAGNOSIS — J45909 Unspecified asthma, uncomplicated: Secondary | ICD-10-CM | POA: Insufficient documentation

## 2022-04-06 DIAGNOSIS — S32000A Wedge compression fracture of unspecified lumbar vertebra, initial encounter for closed fracture: Secondary | ICD-10-CM | POA: Diagnosis present

## 2022-04-06 DIAGNOSIS — Z87891 Personal history of nicotine dependence: Secondary | ICD-10-CM | POA: Insufficient documentation

## 2022-04-06 DIAGNOSIS — S32018A Other fracture of first lumbar vertebra, initial encounter for closed fracture: Secondary | ICD-10-CM | POA: Insufficient documentation

## 2022-04-06 DIAGNOSIS — E1122 Type 2 diabetes mellitus with diabetic chronic kidney disease: Secondary | ICD-10-CM | POA: Diagnosis not present

## 2022-04-06 DIAGNOSIS — Z8673 Personal history of transient ischemic attack (TIA), and cerebral infarction without residual deficits: Secondary | ICD-10-CM | POA: Insufficient documentation

## 2022-04-06 HISTORY — DX: Unspecified osteoarthritis, unspecified site: M19.90

## 2022-04-06 HISTORY — DX: Anemia, unspecified: D64.9

## 2022-04-06 HISTORY — DX: Presence of external hearing-aid: Z97.4

## 2022-04-06 HISTORY — DX: Presence of dental prosthetic device (complete) (partial): Z97.2

## 2022-04-06 HISTORY — DX: Gastro-esophageal reflux disease without esophagitis: K21.9

## 2022-04-06 HISTORY — PX: KYPHOPLASTY: SHX5884

## 2022-04-06 LAB — GLUCOSE, CAPILLARY
Glucose-Capillary: 179 mg/dL — ABNORMAL HIGH (ref 70–99)
Glucose-Capillary: 154 mg/dL — ABNORMAL HIGH (ref 70–99)
Glucose-Capillary: 146 mg/dL — ABNORMAL HIGH (ref 70–99)
Glucose-Capillary: 185 mg/dL — ABNORMAL HIGH (ref 70–99)

## 2022-04-06 SURGERY — KYPHOPLASTY
Anesthesia: General

## 2022-04-06 MED ORDER — ATORVASTATIN CALCIUM 40 MG PO TABS
40.0000 mg | ORAL_TABLET | Freq: Every day | ORAL | Status: DC
  Administered 2022-04-06: 40 mg via ORAL
  Filled 2022-04-06: qty 1

## 2022-04-06 MED ORDER — BISACODYL 10 MG RE SUPP: 10.0000 mg | Freq: Every day | RECTAL | Status: DC | PRN

## 2022-04-06 MED ORDER — DOCUSATE SODIUM 100 MG PO CAPS
100.0000 mg | ORAL_CAPSULE | Freq: Two times a day (BID) | ORAL | Status: DC
  Administered 2022-04-06 – 2022-04-07 (×2): 100 mg via ORAL
  Filled 2022-04-06 (×2): qty 1

## 2022-04-06 MED ORDER — PROPOFOL 10 MG/ML IV BOLUS
INTRAVENOUS | Status: AC
  Filled 2022-04-06: qty 20

## 2022-04-06 MED ORDER — FENTANYL CITRATE (PF) 100 MCG/2ML IJ SOLN
INTRAMUSCULAR | Status: AC
  Filled 2022-04-06: qty 2

## 2022-04-06 MED ORDER — CLOPIDOGREL BISULFATE 75 MG PO TABS: 75.0000 mg | ORAL_TABLET | Freq: Every morning | ORAL | Status: DC

## 2022-04-06 MED ORDER — ALBUTEROL SULFATE HFA 108 (90 BASE) MCG/ACT IN AERS: 2.0000 | INHALATION_SPRAY | Freq: Four times a day (QID) | RESPIRATORY_TRACT | Status: DC | PRN

## 2022-04-06 MED ORDER — GABAPENTIN 300 MG PO CAPS
300.0000 mg | ORAL_CAPSULE | Freq: Every day | ORAL | Status: DC
  Administered 2022-04-06: 300 mg via ORAL
  Filled 2022-04-06: qty 1

## 2022-04-06 MED ORDER — CHLORHEXIDINE GLUCONATE CLOTH 2 % EX PADS: 6.0000 | MEDICATED_PAD | Freq: Once | CUTANEOUS | Status: DC

## 2022-04-06 MED ORDER — CHLORHEXIDINE GLUCONATE 0.12 % MT SOLN
15.0000 mL | Freq: Once | OROMUCOSAL | Status: AC
  Administered 2022-04-06: 15 mL via OROMUCOSAL
  Filled 2022-04-06: qty 15

## 2022-04-06 MED ORDER — ROCURONIUM BROMIDE 10 MG/ML (PF) SYRINGE
PREFILLED_SYRINGE | INTRAVENOUS | Status: AC
  Filled 2022-04-06: qty 20

## 2022-04-06 MED ORDER — ACETAMINOPHEN 160 MG/5ML PO SOLN: 1000.0000 mg | Freq: Once | ORAL | Status: DC | PRN

## 2022-04-06 MED ORDER — FENTANYL CITRATE (PF) 250 MCG/5ML IJ SOLN
INTRAMUSCULAR | Status: AC
  Filled 2022-04-06: qty 5

## 2022-04-06 MED ORDER — ACETAMINOPHEN 325 MG PO TABS: 650.0000 mg | ORAL_TABLET | Freq: Four times a day (QID) | ORAL | Status: DC | PRN

## 2022-04-06 MED ORDER — ONDANSETRON HCL 4 MG/2ML IJ SOLN
INTRAMUSCULAR | Status: DC | PRN
  Administered 2022-04-06: 4 mg via INTRAVENOUS

## 2022-04-06 MED ORDER — ROCURONIUM BROMIDE 10 MG/ML (PF) SYRINGE
PREFILLED_SYRINGE | INTRAVENOUS | Status: DC | PRN
  Administered 2022-04-06: 70 mg via INTRAVENOUS

## 2022-04-06 MED ORDER — HYDROCHLOROTHIAZIDE 25 MG PO TABS
25.0000 mg | ORAL_TABLET | Freq: Every day | ORAL | Status: DC
  Administered 2022-04-06: 25 mg via ORAL
  Filled 2022-04-06 (×2): qty 1

## 2022-04-06 MED ORDER — LIDOCAINE-EPINEPHRINE 1 %-1:100000 IJ SOLN
INTRAMUSCULAR | Status: DC | PRN
  Administered 2022-04-06: 10 mL

## 2022-04-06 MED ORDER — MONTELUKAST SODIUM 10 MG PO TABS
10.0000 mg | ORAL_TABLET | Freq: Every day | ORAL | Status: DC
  Administered 2022-04-06: 10 mg via ORAL
  Filled 2022-04-06: qty 1

## 2022-04-06 MED ORDER — ACETAMINOPHEN 10 MG/ML IV SOLN: 1000.0000 mg | Freq: Once | INTRAVENOUS | Status: DC | PRN

## 2022-04-06 MED ORDER — ALBUTEROL SULFATE (2.5 MG/3ML) 0.083% IN NEBU: 2.5000 mg | INHALATION_SOLUTION | Freq: Four times a day (QID) | RESPIRATORY_TRACT | Status: DC | PRN

## 2022-04-06 MED ORDER — ACETAMINOPHEN 500 MG PO TABS: 1000.0000 mg | ORAL_TABLET | Freq: Once | ORAL | Status: DC | PRN

## 2022-04-06 MED ORDER — FERROUS SULFATE 325 (65 FE) MG PO TABS: 325.0000 mg | ORAL_TABLET | Freq: Every day | ORAL | Status: DC

## 2022-04-06 MED ORDER — CHOLECALCIFEROL 10 MCG (400 UNIT) PO TABS
400.0000 [IU] | ORAL_TABLET | Freq: Every day | ORAL | Status: DC
  Administered 2022-04-07: 400 [IU] via ORAL
  Filled 2022-04-06 (×2): qty 1

## 2022-04-06 MED ORDER — LIDOCAINE-EPINEPHRINE 1 %-1:100000 IJ SOLN
INTRAMUSCULAR | Status: AC
  Filled 2022-04-06: qty 1

## 2022-04-06 MED ORDER — IOHEXOL 300 MG/ML  SOLN
INTRAMUSCULAR | Status: DC | PRN
  Administered 2022-04-06: 100 mL

## 2022-04-06 MED ORDER — PANTOPRAZOLE SODIUM 40 MG PO TBEC
40.0000 mg | DELAYED_RELEASE_TABLET | Freq: Every day | ORAL | Status: DC
  Administered 2022-04-07: 40 mg via ORAL
  Filled 2022-04-06: qty 1

## 2022-04-06 MED ORDER — SUGAMMADEX SODIUM 200 MG/2ML IV SOLN
INTRAVENOUS | Status: DC | PRN
  Administered 2022-04-06: 200 mg via INTRAVENOUS

## 2022-04-06 MED ORDER — ORAL CARE MOUTH RINSE: 15.0000 mL | Freq: Once | OROMUCOSAL | Status: AC

## 2022-04-06 MED ORDER — MAGNESIUM GLUCONATE 500 MG PO TABS
250.0000 mg | ORAL_TABLET | Freq: Every day | ORAL | Status: DC
  Administered 2022-04-07: 250 mg via ORAL
  Filled 2022-04-06 (×2): qty 1

## 2022-04-06 MED ORDER — HYDROMORPHONE HCL 1 MG/ML IJ SOLN: 0.5000 mg | INTRAMUSCULAR | Status: DC | PRN

## 2022-04-06 MED ORDER — HYDRALAZINE HCL 20 MG/ML IJ SOLN: 10.0000 mg | INTRAMUSCULAR | Status: DC | PRN

## 2022-04-06 MED ORDER — BUDESONIDE 0.25 MG/2ML IN SUSP
0.2500 mg | Freq: Two times a day (BID) | RESPIRATORY_TRACT | Status: DC
  Administered 2022-04-06: 0.25 mg via RESPIRATORY_TRACT
  Filled 2022-04-06: qty 2

## 2022-04-06 MED ORDER — FENTANYL CITRATE (PF) 100 MCG/2ML IJ SOLN
25.0000 ug | INTRAMUSCULAR | Status: DC | PRN
  Administered 2022-04-06: 25 ug via INTRAVENOUS
  Administered 2022-04-06: 50 ug via INTRAVENOUS
  Administered 2022-04-06: 25 ug via INTRAVENOUS
  Administered 2022-04-06: 50 ug via INTRAVENOUS

## 2022-04-06 MED ORDER — FLEET ENEMA 7-19 GM/118ML RE ENEM: 1.0000 | ENEMA | Freq: Once | RECTAL | Status: DC | PRN

## 2022-04-06 MED ORDER — ONDANSETRON HCL 4 MG PO TABS: 4.0000 mg | ORAL_TABLET | Freq: Four times a day (QID) | ORAL | Status: DC | PRN

## 2022-04-06 MED ORDER — ACETAMINOPHEN 650 MG RE SUPP: 650.0000 mg | Freq: Four times a day (QID) | RECTAL | Status: DC | PRN

## 2022-04-06 MED ORDER — 0.9 % SODIUM CHLORIDE (POUR BTL) OPTIME
TOPICAL | Status: DC | PRN
  Administered 2022-04-06: 1500 mL

## 2022-04-06 MED ORDER — LIDOCAINE 2% (20 MG/ML) 5 ML SYRINGE
INTRAMUSCULAR | Status: DC | PRN
  Administered 2022-04-06: 80 mg via INTRAVENOUS

## 2022-04-06 MED ORDER — FENTANYL CITRATE (PF) 250 MCG/5ML IJ SOLN
INTRAMUSCULAR | Status: DC | PRN
  Administered 2022-04-06: 25 ug via INTRAVENOUS

## 2022-04-06 MED ORDER — VITAMIN B-12 1000 MCG PO TABS
1000.0000 ug | ORAL_TABLET | Freq: Every day | ORAL | Status: DC
  Administered 2022-04-06 – 2022-04-07 (×2): 1000 ug via ORAL
  Filled 2022-04-06 (×2): qty 1

## 2022-04-06 MED ORDER — INSULIN ASPART 100 UNIT/ML IJ SOLN: 0.0000 [IU] | INTRAMUSCULAR | Status: DC | PRN

## 2022-04-06 MED ORDER — LACTATED RINGERS IV SOLN: INTRAVENOUS | Status: DC

## 2022-04-06 MED ORDER — SERTRALINE HCL 50 MG PO TABS
100.0000 mg | ORAL_TABLET | Freq: Every day | ORAL | Status: DC
  Administered 2022-04-06: 100 mg via ORAL
  Filled 2022-04-06: qty 2

## 2022-04-06 MED ORDER — CEFAZOLIN SODIUM-DEXTROSE 2-4 GM/100ML-% IV SOLN
2.0000 g | INTRAVENOUS | Status: AC
  Administered 2022-04-06: 2 g via INTRAVENOUS
  Filled 2022-04-06: qty 100

## 2022-04-06 MED ORDER — VITAMIN D-3 25 MCG (1000 UT) PO CAPS: 1000.0000 [IU] | ORAL_CAPSULE | Freq: Every day | ORAL | Status: DC

## 2022-04-06 MED ORDER — POLYETHYLENE GLYCOL 3350 17 G PO PACK: 17.0000 g | PACK | Freq: Every day | ORAL | Status: DC | PRN

## 2022-04-06 MED ORDER — PROPOFOL 10 MG/ML IV BOLUS
INTRAVENOUS | Status: DC | PRN
  Administered 2022-04-06: 90 mg via INTRAVENOUS
  Administered 2022-04-06: 20 mg via INTRAVENOUS

## 2022-04-06 MED ORDER — GLIPIZIDE ER 2.5 MG PO TB24
2.5000 mg | ORAL_TABLET | Freq: Every morning | ORAL | Status: DC
  Administered 2022-04-07: 2.5 mg via ORAL
  Filled 2022-04-06: qty 1

## 2022-04-06 MED ORDER — MAGNESIUM 200 MG PO TABS: 250.0000 mg | ORAL_TABLET | Freq: Every day | ORAL | Status: DC

## 2022-04-06 MED ORDER — DEXAMETHASONE SODIUM PHOSPHATE 10 MG/ML IJ SOLN
INTRAMUSCULAR | Status: AC
  Filled 2022-04-06: qty 2

## 2022-04-06 MED ORDER — LORATADINE 10 MG PO TABS
10.0000 mg | ORAL_TABLET | Freq: Every day | ORAL | Status: DC
  Administered 2022-04-06 – 2022-04-07 (×2): 10 mg via ORAL
  Filled 2022-04-06 (×2): qty 1

## 2022-04-06 MED ORDER — ONDANSETRON HCL 4 MG/2ML IJ SOLN: 4.0000 mg | Freq: Four times a day (QID) | INTRAMUSCULAR | Status: DC | PRN

## 2022-04-06 MED ORDER — CLOPIDOGREL BISULFATE 75 MG PO TABS
75.0000 mg | ORAL_TABLET | Freq: Every morning | ORAL | Status: DC
  Administered 2022-04-07: 75 mg via ORAL
  Filled 2022-04-06: qty 1

## 2022-04-06 MED ORDER — OXYCODONE-ACETAMINOPHEN 5-325 MG PO TABS
1.0000 | ORAL_TABLET | ORAL | Status: DC | PRN
  Administered 2022-04-06 – 2022-04-07 (×2): 1 via ORAL
  Filled 2022-04-06 (×2): qty 1

## 2022-04-06 MED ORDER — LOSARTAN POTASSIUM 50 MG PO TABS
50.0000 mg | ORAL_TABLET | Freq: Every day | ORAL | Status: DC
  Administered 2022-04-06 – 2022-04-07 (×2): 50 mg via ORAL
  Filled 2022-04-06 (×2): qty 1

## 2022-04-06 MED ORDER — METHOCARBAMOL 1000 MG/10ML IJ SOLN: 500.0000 mg | Freq: Four times a day (QID) | INTRAVENOUS | Status: DC | PRN

## 2022-04-06 SURGICAL SUPPLY — 51 items
BAG COUNTER SPONGE SURGICOUNT (BAG) ×1 IMPLANT
BLADE CLIPPER SURG (BLADE) IMPLANT
BLADE SURG 15 STRL LF DISP TIS (BLADE) ×1 IMPLANT
BLADE SURG 15 STRL SS (BLADE) ×1
BONE FILLER DEVICE STRL SZ3 (INSTRUMENTS) IMPLANT
CANISTER SUCT 3000ML PPV (MISCELLANEOUS) ×1 IMPLANT
CEMENT KYPHON C01A KIT/MIXER (Cement) IMPLANT
CONT SPEC 4OZ CLIKSEAL STRL BL (MISCELLANEOUS) IMPLANT
DERMABOND ADVANCED .7 DNX12 (GAUZE/BANDAGES/DRESSINGS) ×1 IMPLANT
DERMABOND ADVANCED .7 DNX6 (GAUZE/BANDAGES/DRESSINGS) IMPLANT
DRAPE C-ARM 42X72 X-RAY (DRAPES) ×2 IMPLANT
DRAPE C-ARMOR (DRAPES) IMPLANT
DRAPE INCISE IOBAN 66X45 STRL (DRAPES) IMPLANT
DRAPE LAPAROTOMY 100X72X124 (DRAPES) ×1 IMPLANT
DRAPE SURG 17X23 STRL (DRAPES) ×1 IMPLANT
DRAPE WARM FLUID 44X44 (DRAPES) ×1 IMPLANT
DURAPREP 26ML APPLICATOR (WOUND CARE) ×1 IMPLANT
GAUZE 4X4 16PLY ~~LOC~~+RFID DBL (SPONGE) IMPLANT
GAUZE SPONGE 4X4 12PLY STRL (GAUZE/BANDAGES/DRESSINGS) IMPLANT
GLOVE BIOGEL PI IND STRL 7.5 (GLOVE) ×1 IMPLANT
GLOVE ECLIPSE 7.5 STRL STRAW (GLOVE) ×1 IMPLANT
GLOVE EXAM NITRILE LRG STRL (GLOVE) IMPLANT
GLOVE EXAM NITRILE XL STR (GLOVE) IMPLANT
GLOVE EXAM NITRILE XS STR PU (GLOVE) IMPLANT
GOWN STRL REUS W/ TWL LRG LVL3 (GOWN DISPOSABLE) ×2 IMPLANT
GOWN STRL REUS W/ TWL XL LVL3 (GOWN DISPOSABLE) IMPLANT
GOWN STRL REUS W/TWL 2XL LVL3 (GOWN DISPOSABLE) IMPLANT
GOWN STRL REUS W/TWL LRG LVL3 (GOWN DISPOSABLE) ×2
GOWN STRL REUS W/TWL XL LVL3 (GOWN DISPOSABLE)
INTRODUCER SYS OSTEO KYPHX 1 (INTRODUCER) IMPLANT
KIT BASIN OR (CUSTOM PROCEDURE TRAY) ×1 IMPLANT
KIT TURNOVER KIT B (KITS) ×1 IMPLANT
NDL SPNL 18GX3.5 QUINCKE PK (NEEDLE) ×1 IMPLANT
NEEDLE HYPO 22GX1.5 SAFETY (NEEDLE) ×1 IMPLANT
NEEDLE SPNL 18GX3.5 QUINCKE PK (NEEDLE) ×1 IMPLANT
NS IRRIG 1000ML POUR BTL (IV SOLUTION) ×1 IMPLANT
PACK BASIC III (CUSTOM PROCEDURE TRAY) ×1
PACK SRG BSC III STRL LF ECLPS (CUSTOM PROCEDURE TRAY) ×1 IMPLANT
PAD ARMBOARD 7.5X6 YLW CONV (MISCELLANEOUS) ×3 IMPLANT
SPIKE FLUID TRANSFER (MISCELLANEOUS) ×1 IMPLANT
SPONGE T-LAP 4X18 ~~LOC~~+RFID (SPONGE) IMPLANT
SUT MNCRL AB 3-0 PS2 18 (SUTURE) IMPLANT
SUT VIC AB 3-0 SH 8-18 (SUTURE) IMPLANT
SYR 30ML BONE CEMENT XPNDR (MISCELLANEOUS) ×2
SYR CONTROL 10ML LL (SYRINGE) IMPLANT
SYRINGE 30ML BONE CEMENT XPNDR (MISCELLANEOUS) IMPLANT
TAMP BONE INFLATABLE SZ3 15MML (BALLOONS) IMPLANT
TOWEL GREEN STERILE (TOWEL DISPOSABLE) ×1 IMPLANT
TOWEL GREEN STERILE FF (TOWEL DISPOSABLE) ×1 IMPLANT
TRAY KYPHOPAK 15/3 ONESTEP 1ST (MISCELLANEOUS) IMPLANT
WATER STERILE IRR 1000ML POUR (IV SOLUTION) ×1 IMPLANT

## 2022-04-06 NOTE — Transfer of Care (Signed)
Immediate Anesthesia Transfer of Care Note  Patient: Teresa Daniels  Procedure(s) Performed: Lumbar one KYPHOPLASTY  Patient Location: PACU  Anesthesia Type:General  Level of Consciousness: drowsy and patient cooperative  Airway & Oxygen Therapy: Patient Spontanous Breathing  Post-op Assessment: Report given to RN and Post -op Vital signs reviewed and stable  Post vital signs: Reviewed and stable  Last Vitals:  Vitals Value Taken Time  BP 180/72 04/06/22 1609  Temp    Pulse 82 04/06/22 1611  Resp 15 04/06/22 1611  SpO2 88 % 04/06/22 1611  Vitals shown include unvalidated device data.  Last Pain:  Vitals:   04/06/22 1159  TempSrc:   PainSc: 0-No pain         Complications: No notable events documented.

## 2022-04-06 NOTE — Anesthesia Postprocedure Evaluation (Signed)
Anesthesia Post Note  Patient: Teresa Daniels  Procedure(s) Performed: Lumbar one KYPHOPLASTY     Patient location during evaluation: PACU Anesthesia Type: General Level of consciousness: patient cooperative, oriented and sedated Pain management: pain level controlled Vital Signs Assessment: post-procedure vital signs reviewed and stable Respiratory status: spontaneous breathing, nonlabored ventilation and respiratory function stable Cardiovascular status: blood pressure returned to baseline and stable Postop Assessment: no apparent nausea or vomiting Anesthetic complications: no   No notable events documented.  Last Vitals:  Vitals:   04/06/22 1645 04/06/22 1700  BP: (!) 157/80 (!) 157/78  Pulse: 89 92  Resp: 16 10  Temp:    SpO2: 96% 92%    Last Pain:  Vitals:   04/06/22 1630  TempSrc:   PainSc: 7                  Catelyn Friel,E. Bawi Lakins

## 2022-04-06 NOTE — Anesthesia Procedure Notes (Signed)
Procedure Name: Intubation Date/Time: 04/06/2022 3:01 PM  Performed by: Georgia Duff, CRNAPre-anesthesia Checklist: Patient identified, Emergency Drugs available, Suction available and Patient being monitored Patient Re-evaluated:Patient Re-evaluated prior to induction Oxygen Delivery Method: Circle System Utilized Preoxygenation: Pre-oxygenation with 100% oxygen Induction Type: IV induction Ventilation: Mask ventilation without difficulty Laryngoscope Size: Miller Grade View: Grade II Tube type: Oral Number of attempts: 1 Airway Equipment and Method: Stylet and Oral airway Placement Confirmation: ETT inserted through vocal cords under direct vision, positive ETCO2 and breath sounds checked- equal and bilateral Secured at: 20 cm Tube secured with: Tape Dental Injury: Teeth and Oropharynx as per pre-operative assessment

## 2022-04-06 NOTE — OR Nursing (Signed)
Kyphon bone cement used expires 07/09/24

## 2022-04-06 NOTE — Op Note (Signed)
PATIENT: Teresa Daniels  DAY OF SURGERY: 04/06/22   PRE-OPERATIVE DIAGNOSIS:  L1 compression fracture   POST-OPERATIVE DIAGNOSIS:  Same   PROCEDURE:  L1 kyphoplasty   SURGEON:  Surgeon(s) and Role:    Judith Part, MD - Primary   ANESTHESIA: ETGA   BRIEF HISTORY: This is an 81 year old woman who presented with back pain after a fall. The patient was found to have a new L1 compression fracture. Her pain was severe and rendered her non-ambulatory from a pain limitation, I therefore recommended a kyphoplasty at that level. This was discussed with the patient as well as risks, benefits, and alternatives and wished to proceed with surgery.   OPERATIVE DETAIL: The patient was taken to the operating room, a formal time out was performed with two patient identifiers, anesthesia was induced by the anesthesia team, and the patient was placed on the OR table in the prone position. Fluoroscopy was used to localize the operative level and bi-plane fluoroscopy was used for the entirety of the procedure. The area was then prepped and draped in a sterile fashion.   Using fluoroscopic guidance, entry sites were identified and a small stab incision was made large enough to accommodate the needles. The needles were then inserted percutaneously into the pedicles of L1 bilaterally and into the vertebral body. The inner cannula was removed and balloons were inserted and expanded using contrast for visualization. The balloons were then deflated and removed and cement was inserted through the cannula with fluoroscopic monitoring for any leakage out of the vertebral body with no evidence of significant leakage. The inner cannulas were then reinserted and the needles were removed. All instrument and sponge counts were correct, dermabond was placed over the puncture sites bilaterally. The patient was then returned to anesthesia for emergence. No apparent complications at the completion of the procedure.   EBL:   39mL   DRAINS: none   SPECIMENS: none   Judith Part, MD 04/06/22 3:53 PM

## 2022-04-06 NOTE — Anesthesia Preprocedure Evaluation (Signed)
Anesthesia Evaluation  Patient identified by MRN, date of birth, ID band Patient awake    Reviewed: Allergy & Precautions, NPO status , Patient's Chart, lab work & pertinent test results  History of Anesthesia Complications Negative for: history of anesthetic complications  Airway Mallampati: III  TM Distance: >3 FB Neck ROM: Full    Dental  (+) Lower Dentures, Edentulous Lower, Dental Advisory Given   Pulmonary asthma , former smoker,    breath sounds clear to auscultation       Cardiovascular hypertension, Pt. on medications  Rhythm:Regular  1. Left ventricular ejection fraction, by estimation, is 50 to 55%. Left  ventricular ejection fraction by 3D volume is 50 %. The left ventricle has  low normal function. Difficult to assess wall motion due to incomplete  visualization of the LV  endocardium. Septal motion is abnormal in the setting of a LBBB. There is  mild concentric left ventricular hypertrophy. Left ventricular diastolic  parameters are consistent with Grade I diastolic dysfunction (impaired  relaxation).  2. Right ventricular systolic function is normal. The right ventricular  size is normal. There is normal pulmonary artery systolic pressure. The  estimated right ventricular systolic pressure is 71.2 mmHg.  3. The mitral valve is normal in structure. Trivial mitral valve  regurgitation.  4. The aortic valve is tricuspid. Aortic valve regurgitation is not  visualized. Aortic valve sclerosis is present, with no evidence of aortic  valve stenosis.  5. The inferior vena cava is normal in size with greater than 50%  respiratory variability, suggesting right atrial pressure of 3 mmHg.   Neuro/Psych PSYCHIATRIC DISORDERS Depression  Neuromuscular disease CVA, Residual Symptoms    GI/Hepatic Neg liver ROS, GERD  ,  Endo/Other  diabetes, Type 2Lab Results      Component                Value               Date                       HGBA1C                   7.2 (H)             01/25/2022             Renal/GU CRFRenal diseaseLab Results      Component                Value               Date                      CREATININE               1.59 (H)            03/11/2022           Lab Results      Component                Value               Date                      K                        4.1  03/11/2022                Musculoskeletal  (+) Arthritis ,   Abdominal   Peds  Hematology  (+) Blood dyscrasia, anemia , Lab Results      Component                Value               Date                      WBC                      7.9                 03/11/2022                HGB                      9.4 (L)             03/11/2022                HCT                      27.6 (L)            03/11/2022                MCV                      101.1 (H)           03/11/2022                PLT                      178                 03/11/2022              Anesthesia Other Findings   Reproductive/Obstetrics                             Anesthesia Physical Anesthesia Plan  ASA: 3  Anesthesia Plan: General   Post-op Pain Management: Ofirmev IV (intra-op)*   Induction: Intravenous  PONV Risk Score and Plan: 3 and Ondansetron and Dexamethasone  Airway Management Planned: Oral ETT  Additional Equipment: None  Intra-op Plan:   Post-operative Plan: Extubation in OR  Informed Consent: I have reviewed the patients History and Physical, chart, labs and discussed the procedure including the risks, benefits and alternatives for the proposed anesthesia with the patient or authorized representative who has indicated his/her understanding and acceptance.     Dental advisory given  Plan Discussed with: CRNA  Anesthesia Plan Comments:         Anesthesia Quick Evaluation

## 2022-04-06 NOTE — H&P (Signed)
Surgical H&P Update  HPI: 81 y.o. with a history of prior lumbar lami w/ post-op epidural hematoma s/p evacuation. Was doing well and recovering then had a fall with severe back pain. Workup showed a new fracture at L1. No changes in health since they were last seen. Still having the above and wishes to proceed with surgery.  PMHx:  Past Medical History:  Diagnosis Date   Anemia    Arthritis    Asthma    CKD (chronic kidney disease) stage 4, GFR 15-29 ml/min (HCC)    Depression    Diabetes mellitus without complication (HCC)    GERD (gastroesophageal reflux disease)    Hypertension    Stroke ([place])    2019 per patient   Wears dentures    lowers, upper partial   Wears hearing aid    FamHx: History reviewed. No pertinent family history. SocHx:  reports that she quit smoking about 54 years ago. Her smoking use included cigarettes. She has never used smokeless tobacco. She reports that she does not currently use alcohol. She reports that she does not use drugs.  Physical Exam: Strength 5/5 x4 and SILTx4 except baseline b/l stocking numbness  Assesment/Plan: 81 y.o. woman with L1 compression fracture, here for kypoplasty. Risks, benefits, and alternatives discussed and the patient would like to continue with surgery.  -OR today -3C post-op  Judith Part, MD 04/06/22 12:14 PM

## 2022-04-07 ENCOUNTER — Ambulatory Visit: Payer: Medicare PPO | Admitting: Neurology

## 2022-04-07 ENCOUNTER — Encounter (HOSPITAL_COMMUNITY): Payer: Self-pay | Admitting: Neurological Surgery

## 2022-04-07 DIAGNOSIS — M8008XA Age-related osteoporosis with current pathological fracture, vertebra(e), initial encounter for fracture: Secondary | ICD-10-CM | POA: Diagnosis not present

## 2022-04-07 LAB — GLUCOSE, CAPILLARY: Glucose-Capillary: 122 mg/dL — ABNORMAL HIGH (ref 70–99)

## 2022-04-07 NOTE — Evaluation (Signed)
Occupational Therapy Evaluation Patient Details Name: Teresa Daniels MRN: [id number] DOB: October 20, 1940 Today's Date: 04/07/2022   History of Present Illness The pt is an 81 yo female presenting after a fall at her apartment.  L1 compression fracture, s/p kyphoplasty 9/28.  Originally s/p L4-5 laminectomy on 8/31, d/c home 9/1. S/p exploration of lumbar laminecotmy with evacuation of hematoma on 9/2. PMH includes: asthma, CKD IV, depression, DM II, HTN, and stroke.   Clinical Impression   Patient admitted for the diagnosis above.  PTA she lives alone in a local ILF.  She is Mod I at 4WRW level in her apartment, and walks to the common dinning room.  Unsteadiness and decreased safety are the primary deficits, most likely due to effects of medication.  Her daughter will be staying with her, precautions sheet issued and reviewed, all questions answered, and no further OT needed in the acute setting.  She has undergone HH OT and PT at her facility, and understands those services are available.  Recommend follow up with MD as prescribed.       Recommendations for follow up therapy are one component of a multi-disciplinary discharge planning process, led by the attending physician.  Recommendations may be updated based on patient status, additional functional criteria and insurance authorization.   Follow Up Recommendations  No OT follow up    Assistance Recommended at Discharge Intermittent Supervision/Assistance  Patient can return home with the following A little help with walking and/or transfers;A little help with bathing/dressing/bathroom;Assist for transportation;Direct supervision/assist for medications management    Functional Status Assessment  Patient has had a recent decline in their functional status and demonstrates the ability to make significant improvements in function in a reasonable and predictable amount of time.  Equipment Recommendations  None recommended by OT     Recommendations for Other Services       Precautions / Restrictions Precautions Precautions: Back;Fall Restrictions Weight Bearing Restrictions: No      Mobility Bed Mobility Overal bed mobility: Needs Assistance Bed Mobility: Sidelying to Sit   Sidelying to sit: Supervision            Transfers Overall transfer level: Needs assistance Equipment used: Rolling walker (2 wheels) Transfers: Sit to/from Stand, Bed to chair/wheelchair/BSC Sit to Stand: Supervision     Step pivot transfers: Supervision            Balance Overall balance assessment: Needs assistance Sitting-balance support: Feet supported Sitting balance-Leahy Scale: Good     Standing balance support: Reliant on assistive device for balance Standing balance-Leahy Scale: Fair                             ADL either performed or assessed with clinical judgement   ADL       Grooming: Supervision/safety;Standing               Lower Body Dressing: Supervision/safety;Sit to/from stand   Toilet Transfer: Supervision/safety;Rolling walker (2 wheels);Regular Toilet                   Vision Patient Visual Report: No change from baseline       Perception Perception Perception: Not tested   Praxis Praxis Praxis: Not tested    Pertinent Vitals/Pain Pain Assessment Pain Assessment: Faces Faces Pain Scale: Hurts little more Pain Location: low back radiating into her thighs Pain Descriptors / Indicators: Aching Pain Intervention(s): Monitored during session     Hand Dominance  Right   Extremity/Trunk Assessment Upper Extremity Assessment Upper Extremity Assessment: Overall WFL for tasks assessed   Lower Extremity Assessment Lower Extremity Assessment: Defer to PT evaluation   Cervical / Trunk Assessment Cervical / Trunk Assessment: Kyphotic;Back Surgery   Communication Communication Communication: No difficulties   Cognition Arousal/Alertness:  Awake/alert Behavior During Therapy: WFL for tasks assessed/performed Overall Cognitive Status: Within Functional Limits for tasks assessed                                       General Comments   VSS    Exercises     Shoulder Instructions      Home Living Family/patient expects to be discharged to:: Private residence Living Arrangements: Alone Available Help at Discharge: Family;Available 24 hours/day Type of Home: Independent living facility Home Access: Level entry;Elevator     Home Layout: One level     Bathroom Shower/Tub: Walk-in shower;Sponge bathes at baseline   Bathroom Toilet: Handicapped height Bathroom Accessibility: Yes How Accessible: Accessible via walker Home Equipment: [place] (2 wheels);Rollator (4 wheels);Cane - single point;Adaptive equipment;Shower seat;Grab bars - tub/shower;Wheelchair - Higher education careers adviser: [employer]acher        Prior Functioning/Environment Prior Level of Function : Needs assist             Mobility Comments: walks with rollator, recent fall. ADLs Comments: daughter assists with community mobility and groceries.        OT Problem List: Pain      OT Treatment/Interventions:      OT Goals(Current goals can be found in the care plan section) Acute Rehab OT Goals OT Goal Formulation: With patient Time For Goal Achievement: 04/10/22 Potential to Achieve Goals: Good  OT Frequency:      Co-evaluation              AM-PAC OT "6 Clicks" Daily Activity     Outcome Measure Help from another person eating meals?: None Help from another person taking care of personal grooming?: A Little Help from another person toileting, which includes using toliet, bedpan, or urinal?: A Little Help from another person bathing (including washing, rinsing, drying)?: A Little Help from another person to put on and taking off regular upper body clothing?: None Help from another person to put on and taking off  regular lower body clothing?: A Little 6 Click Score: 20   End of Session Equipment Utilized During Treatment: Rolling walker (2 wheels) Nurse Communication: Mobility status  Activity Tolerance: Patient tolerated treatment well Patient left: in bed;with call bell/phone within reach  OT Visit Diagnosis: Unsteadiness on feet (R26.81)                Time: 4270-6237 OT Time Calculation (min): 13 min Charges:  OT General Charges $OT Visit: 1 Visit OT Evaluation $OT Eval Moderate Complexity: 1 Mod  04/07/2022  RP, OTR/L  Acute Rehabilitation Services  Office:  [phone]   Metta Clines 04/07/2022, 10:34 AM

## 2022-04-07 NOTE — Progress Notes (Signed)
Neurosurgery Service Progress Note  Subjective: No acute events overnight. LBP improved from preop, pretty dramatically   Objective: Vitals:   04/06/22 1954 04/06/22 2045 04/06/22 2325 04/07/22 0334  BP: (!) 150/70  (!) 123/59 129/63  Pulse: 95 87 78 65  Resp: 18 16 18 18   Temp: 98.6 F (37 C)  99.6 F (37.6 C) 98.5 F (36.9 C)  TempSrc: Oral  Oral Oral  SpO2: 94% 97% 94% 97%  Weight:      Height:        Physical Exam: Strength 5/5 x4 and SILTx4 except baseline b/l stocking numbness  Assessment & Plan: 81 y.o. woman with L1 cx fx s/p L1 kyphoplasty, recovering well.  -discharge home today  Judith Part  04/07/22 7:14 AM

## 2022-04-07 NOTE — Discharge Summary (Signed)
Discharge Summary  Date of Admission: 04/06/2022  Date of Discharge: 04/07/22  Attending Physician: Emelda Brothers, MD  Hospital Course: Patient was admitted following an uncomplicated L1 kyphoplasty. They were recovered in PACU and transferred to [facility] Her preop back pain was improved, her hospital course was uncomplicated and the patient was discharged home on 04/07/22. They will follow up in clinic with me in clinic in 2 weeks.  Neurologic exam at discharge:  Strength 5/5 x4 and SILTx4 except baseline b/l stocking numbness  Discharge diagnosis: Lumbar compression fracture  Judith Part, MD 04/07/22 7:16 AM

## 2022-04-07 NOTE — Progress Notes (Signed)
Patient alert and oriented, mae's well, voiding adequate amount of urine, swallowing without difficulty, no c/o pain at time of discharge. Patient discharged home with family. Script and discharged instructions given to patient. Patient and family stated understanding of instructions given. Patient has an appointment with Dr. Zada Finders in 2 weeks. Patient awaiting daughter for ride home

## 2022-04-07 NOTE — Progress Notes (Signed)
PT Cancellation Note  Patient Details Name: Teresa Daniels MRN: [id number] DOB: Nov 09, 1940   Cancelled Treatment:    Reason Eval/Treat Not Completed: Patient declined, no reason specified. Pt reports "I've been up to the bathroom, and that's enough." Declines any further mobility at this time. Has worked with OT and will have adequate support at d/c. Pt currently awaiting d/c. Will sign off at this time. If needs change, please reconsult.    Thelma Comp 04/07/2022, 11:20 AM  Rolinda Roan, PT, DPT Acute Rehabilitation Services Secure Chat Preferred Office: [phone]

## 2022-05-07 IMAGING — DX DG CHEST 1V PORT
1 series · 1 of 1 positions shown · non-contrast
Comparison: August 07, 2019

CLINICAL DATA: Weakness

EXAM:
PORTABLE CHEST 1 VIEW

[chest ap]
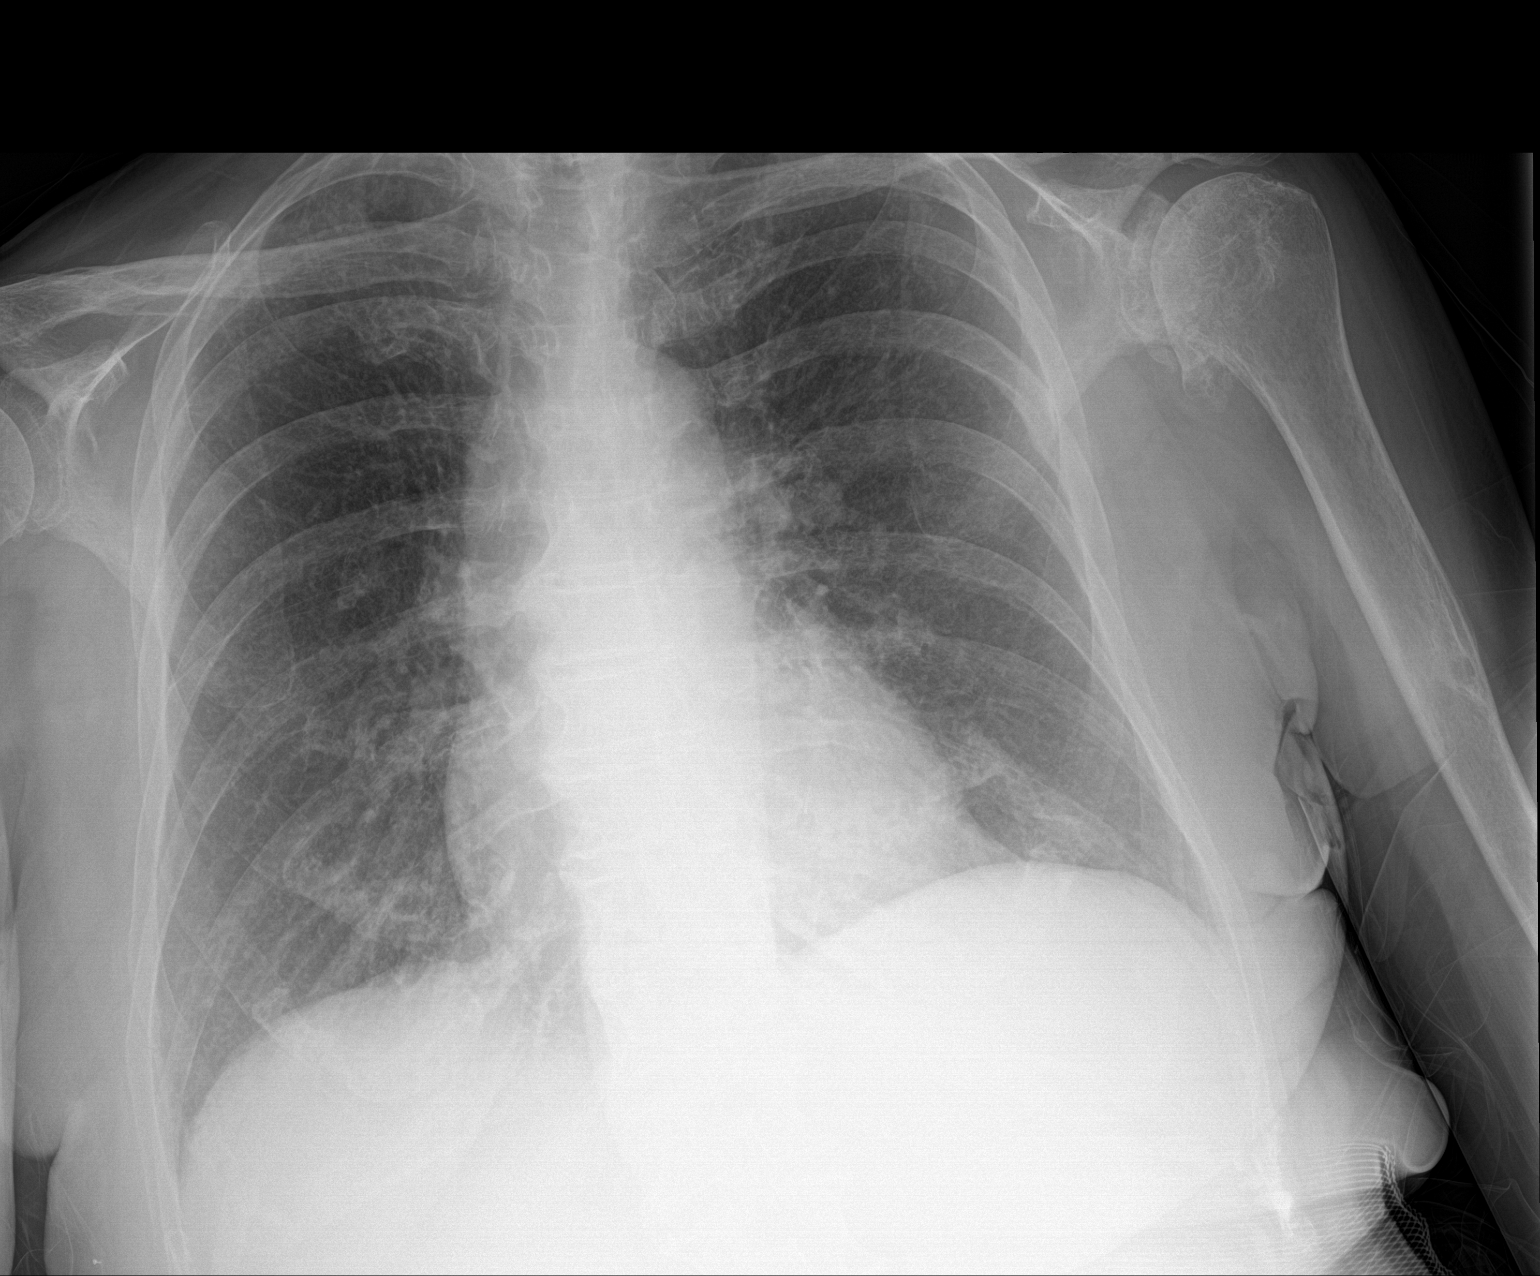

[1 of 1 positions shown; findings below may reference images not displayed]

FINDINGS: There is ill-defined airspace opacity in the left base. Lungs
elsewhere are clear. Heart size and pulmonary vascularity are
normal. No adenopathy. Degenerative change noted in left shoulder.
IMPRESSION: Ill-defined opacity left base, likely representing focus of
pneumonia. Lungs elsewhere clear. Cardiac silhouette normal.

## 2022-06-29 ENCOUNTER — Encounter: Payer: Self-pay | Admitting: General Practice

## 2022-06-29 ENCOUNTER — Other Ambulatory Visit (HOSPITAL_COMMUNITY): Payer: Self-pay | Admitting: Physician Assistant

## 2022-06-29 ENCOUNTER — Other Ambulatory Visit (HOSPITAL_COMMUNITY): Payer: Self-pay | Admitting: Interventional Radiology

## 2022-06-29 ENCOUNTER — Telehealth (HOSPITAL_COMMUNITY): Payer: Self-pay | Admitting: Radiology

## 2022-06-29 DIAGNOSIS — M4620 Osteomyelitis of vertebra, site unspecified: Secondary | ICD-10-CM

## 2022-06-29 NOTE — Progress Notes (Signed)
Teresa Daniels, NT  Michaelle Birks, MD; P Ir Procedure Requests; Luanne Bras, MD      Previous Messages    ----- Message ----- From: Michaelle Birks, MD Sent: 06/29/2022  11:55 AM EST To: Teresa Daniels, NT; Ir Procedure Requests Subject: RE: CT Biopsy ASAP                            BIOPSY REVIEW Date: 06/29/22  Requested Biopsy site: Spine Reason for request: Osteomyelitis  Imaging review: I reviewed all pertinent diagnostic studies, including; MR L spine, 06/15/22 and OR FL, 04/06/22  Recommended imaging modality to perform biopsy: Fluoroscopy Additional comments: L4-5 Disc Aspiration  @Schedulers . 5d Plavix hold. *Can be performed by NIRs (Dr Estanislado Pandy or [facility]) or VIRs that perform spinal procedures  Please contact me with questions, concerns, or if issue pertaining to this request arise.  Michaelle Birks, MD [facility]  Pager. [phone] Clinic. [phone]  ----- Message ----- From: Teresa Daniels, NT Sent: 06/29/2022  11:42 AM EST To: Ir Procedure Requests Subject: CT Biopsy ASAP                                Procedure:  CT Biopsy  Reason: Osteomyelitis of spine  History: MRI in chart  Provider: Collene Schlichter, PA-C  Contact: [phone]

## 2022-06-29 NOTE — Telephone Encounter (Signed)
Received a voicemail from patient's daughter Teresa Daniels) asking about scheduling a biopsy for Ms. Teresa Daniels. She stated that she was told by the office of Dr. Zada Finders ([facility]) to call me to schedule this biopsy. I called her back and left her a message that we do not have a referral nor an order for any type of biopsy from their office. I advised her to call them back to find out what the situation was and to call me as well and I would try to help navigate anything I could for them. JM

## 2022-07-04 ENCOUNTER — Other Ambulatory Visit: Payer: Self-pay | Admitting: Radiology

## 2022-07-04 DIAGNOSIS — M869 Osteomyelitis, unspecified: Secondary | ICD-10-CM

## 2022-07-05 ENCOUNTER — Encounter (HOSPITAL_COMMUNITY): Payer: Self-pay

## 2022-07-05 ENCOUNTER — Ambulatory Visit (HOSPITAL_COMMUNITY)
Admission: RE | Admit: 2022-07-05 | Discharge: 2022-07-05 | Disposition: A | Discharge status: Home / Self Care | Payer: Medicare PPO | Source: Ambulatory Visit | Attending: Interventional Radiology | Admitting: Interventional Radiology

## 2022-07-05 ENCOUNTER — Other Ambulatory Visit: Payer: Self-pay

## 2022-07-05 DIAGNOSIS — M869 Osteomyelitis, unspecified: Secondary | ICD-10-CM

## 2022-07-05 DIAGNOSIS — M549 Dorsalgia, unspecified: Secondary | ICD-10-CM | POA: Insufficient documentation

## 2022-07-05 DIAGNOSIS — M4620 Osteomyelitis of vertebra, site unspecified: Secondary | ICD-10-CM | POA: Insufficient documentation

## 2022-07-05 HISTORY — PX: IR FLUORO GUIDED NEEDLE PLC ASPIRATION/INJECTION LOC: IMG2395

## 2022-07-05 LAB — PROTIME-INR
INR: 1 (ref 0.8–1.2)
Prothrombin Time: 12.9 seconds (ref 11.4–15.2)

## 2022-07-05 LAB — CBC
HCT: 29.6 % — ABNORMAL LOW (ref 36.0–46.0)
Hemoglobin: 10.1 g/dL — ABNORMAL LOW (ref 12.0–15.0)
MCH: 34.1 pg — ABNORMAL HIGH (ref 26.0–34.0)
MCHC: 34.1 g/dL (ref 30.0–36.0)
MCV: 100 fL (ref 80.0–100.0)
Platelets: 165 10*3/uL (ref 150–400)
RBC: 2.96 MIL/uL — ABNORMAL LOW (ref 3.87–5.11)
RDW: 13.1 % (ref 11.5–15.5)
WBC: 6.1 10*3/uL (ref 4.0–10.5)
nRBC: 0 % (ref 0.0–0.2)

## 2022-07-05 LAB — GLUCOSE, CAPILLARY: Glucose-Capillary: 165 mg/dL — ABNORMAL HIGH (ref 70–99)

## 2022-07-05 MED ORDER — SODIUM CHLORIDE 0.9 % IV SOLN: INTRAVENOUS | Status: AC

## 2022-07-05 MED ORDER — MIDAZOLAM HCL 2 MG/2ML IJ SOLN
INTRAMUSCULAR | Status: AC | PRN
  Administered 2022-07-05: 1 mg via INTRAVENOUS

## 2022-07-05 MED ORDER — MIDAZOLAM HCL 2 MG/2ML IJ SOLN
INTRAMUSCULAR | Status: AC
  Filled 2022-07-05: qty 2

## 2022-07-05 MED ORDER — FENTANYL CITRATE (PF) 100 MCG/2ML IJ SOLN
INTRAMUSCULAR | Status: AC
  Filled 2022-07-05: qty 2

## 2022-07-05 MED ORDER — LIDOCAINE HCL 1 % IJ SOLN
INTRAMUSCULAR | Status: AC
  Filled 2022-07-05: qty 20

## 2022-07-05 MED ORDER — FENTANYL CITRATE (PF) 100 MCG/2ML IJ SOLN
INTRAMUSCULAR | Status: AC | PRN
  Administered 2022-07-05: 25 ug via INTRAVENOUS

## 2022-07-05 MED ORDER — BUPIVACAINE HCL (PF) 0.5 % IJ SOLN
INTRAMUSCULAR | Status: AC
  Administered 2022-07-05: 15 mL
  Filled 2022-07-05: qty 30

## 2022-07-05 MED ORDER — ACETAMINOPHEN 500 MG PO TABS
ORAL_TABLET | ORAL | Status: AC
  Filled 2022-07-05: qty 2

## 2022-07-05 MED ORDER — ACETAMINOPHEN 500 MG PO TABS
1000.0000 mg | ORAL_TABLET | Freq: Once | ORAL | Status: AC
  Administered 2022-07-05: 1000 mg via ORAL

## 2022-07-05 MED ORDER — SODIUM CHLORIDE 0.9 % IV SOLN: INTRAVENOUS | Status: DC

## 2022-07-05 NOTE — H&P (Signed)
Chief Complaint: Patient was seen in consultation today for back pain; Lumbar 4-5 discitis--- disc aspiration at the request of T Ostergard MD   Supervising Physician: Luanne Bras  Patient Status: [facility] - Out-pt  History of Present Illness: Teresa Daniels is a 81 y.o. female   Hx L 4-5 laminectomy with Dr Zada Finders 03/09/22 Developed post op hematoma--- evacuation with Dr Amado Coe 03/11/22 Did well for several weeks--- fell at home and had Lumbar 1 KP with Dr Zada Finders 04/06/22  Developed new back pain Images performed at outside facility- (not available to me) MRI 06/15/22 Report in computer Read as L4-5 discitis- probable osteomyelitis  Imaged reviewed with Dr Maryelizabeth Kaufmann and Dr Estanislado Pandy--- approved for procedure Scheduled now for Lumbar 4-5 disc aspiration LD Plavix 06/28/22  Past Medical History:  Diagnosis Date   Anemia    Arthritis    Asthma    CKD (chronic kidney disease) stage 4, GFR 15-29 ml/min (HCC)    Depression    Diabetes mellitus without complication ([place])    GERD (gastroesophageal reflux disease)    Hypertension    Stroke ([place])    2019 per patient   Wears dentures    lowers, upper partial   Wears hearing aid     Past Surgical History:  Procedure Laterality Date   BREAST LUMPECTOMY Left    CESAREAN SECTION     x 1   COLONOSCOPY     KYPHOPLASTY N/A 04/06/2022   Procedure: Lumbar one KYPHOPLASTY;  Surgeon: Judith Part, MD;  Location: [place];  Service: Neurosurgery;  Laterality: N/A;   LUMBAR LAMINECTOMY/ DECOMPRESSION WITH MET-RX N/A 03/09/2022   Procedure: Lumbar Four-Five Minimally Invasive Laminectomy/Metrx;  Surgeon: Judith Part, MD;  Location: [place];  Service: Neurosurgery;  Laterality: N/A;  3C/RM 20 to follow   LUMBAR WOUND DEBRIDEMENT N/A 03/11/2022   Procedure: Exploration of Lumbar Surgical Wound;  Surgeon: Earnie Larsson, MD;  Location: [place];  Service: Neurosurgery;  Laterality: N/A;   ORIF ANKLE FRACTURE Right 02/10/2021    Procedure: OPEN REDUCTION INTERNAL FIXATION (ORIF) ANKLE FRACTURE;  Surgeon: Shona Needles, MD;  Location: [place];  Service: Orthopedics;  Laterality: Right;    Allergies: Cat hair extract  Medications: Prior to Admission medications   Medication Sig Start Date End Date Taking? Authorizing Provider  acetaminophen (TYLENOL) 500 MG tablet Take 1,000-1,500 mg by mouth every 8 (eight) hours as needed for moderate pain.   Yes [provider]  atorvastatin (LIPITOR) 40 MG tablet Take 40 mg by mouth at bedtime.   Yes [provider]  cetirizine (ZYRTEC) 10 MG tablet Take 10 mg by mouth daily.   Yes [provider]  Cyanocobalamin (B-12 PO) Take 1 tablet by mouth 3 (three) times a week.   Yes [provider]  diphenhydramine-acetaminophen (TYLENOL PM) 25-500 MG TABS tablet Take 2 tablets by mouth at bedtime as needed (sleep).   Yes [provider]  Ferrous Sulfate (IRON PO) Take 1 capsule by mouth daily.   Yes [provider]  Fish Oil-Krill Oil (KRILL & FISH OIL BLEND PO) Take 1 capsule by mouth daily after breakfast.   Yes [provider]  gabapentin (NEURONTIN) 300 MG capsule Take 300 mg by mouth daily after supper. 11/26/17  Yes [provider]  glipiZIDE (GLUCOTROL XL) 2.5 MG 24 hr tablet Take 2.5 mg by mouth in the morning. 09/28/20  Yes [provider]  hydrochlorothiazide (HYDRODIURIL) 25 MG tablet Take 25 mg by mouth  daily.   Yes [provider]  losartan (COZAAR) 50 MG tablet Take 50 mg by mouth daily.   Yes [provider]  MAGNESIUM PO Take 1 tablet by mouth daily.   Yes [provider]  montelukast (SINGULAIR) 10 MG tablet Take 10 mg by mouth daily after supper.   Yes [provider]  oxycodone-acetaminophen (PERCOCET) 2.5-325 MG tablet Take 1 tablet by mouth 2 (two) times daily as needed for pain. 03/31/22  Yes [provider]  pantoprazole (PROTONIX) 40 MG tablet  Take 40 mg by mouth daily before breakfast.   Yes [provider]  sertraline (ZOLOFT) 100 MG tablet Take 100 mg by mouth daily after supper. 02/18/18  Yes [provider]  VITAMIN D PO Take 1 tablet by mouth daily.   Yes [provider]  albuterol (VENTOLIN HFA) 108 (90 Base) MCG/ACT inhaler Inhale 2 puffs into the lungs every 6 (six) hours as needed for wheezing or shortness of breath.    [provider]  beclomethasone (QVAR) 40 MCG/ACT inhaler Inhale 1 puff into the lungs 2 (two) times daily as needed (shortness of breath).    [provider]  clopidogrel (PLAVIX) 75 MG tablet Take 1 tablet (75 mg total) by mouth in the morning. Restart on 04/07/22 04/06/22   Judith Part, MD  diclofenac Sodium (VOLTAREN) 1 % GEL Apply 2 g topically daily as needed (pain).    [provider]  Menthol, Topical Analgesic, (ICY HOT EX) Apply 1 Application topically daily as needed (pain).    [provider]     History reviewed. No pertinent family history.  Social History   Socioeconomic History   Marital status: Widowed    Spouse name: Not on file   Number of children: 2   Years of education: 54   Highest education level: Not on file  Occupational History   Occupation: retired Web designer  Tobacco Use   Smoking status: Former    Types: Cigarettes    Quit date: 1969    Years since quitting: 55.0   Smokeless tobacco: Never  Vaping Use   Vaping Use: Never used  Substance and Sexual Activity   Alcohol use: Not Currently    Comment: occasionally-wine   Drug use: Never   Sexual activity: Not Currently    Birth control/protection: Post-menopausal  Other Topics Concern   Not on file  Social History Narrative   Lives independent living..  Has 2 children.  Retired Web designer.  Education: college.       Right hand   Social Determinants of Health   Financial Resource Strain: Not on file  Food Insecurity:  Not on file  Transportation Needs: Not on file  Physical Activity: Not on file  Stress: Not on file  Social Connections: Not on file    Review of Systems: A 12 point ROS discussed and pertinent positives are indicated in the HPI above.  All other systems are negative.  Review of Systems  Constitutional:  Positive for activity change. Negative for fatigue and fever.  Respiratory:  Negative for cough and shortness of breath.   Cardiovascular:  Negative for chest pain.  Gastrointestinal:  Negative for abdominal pain and nausea.  Musculoskeletal:  Positive for back pain and gait problem.  Neurological:  Positive for weakness.  Psychiatric/Behavioral:  Negative for behavioral problems and confusion.     Vital Signs: BP (!) 155/69   Pulse 76   Temp 98 F (36.7 C) (Temporal)  Resp 16   Ht _0  (1.651 m)   Wt 165 lb (74.8 kg)   SpO2 93%   BMI 27.46 kg/m     Physical Exam Vitals reviewed.  HENT:     Mouth/Throat:     Mouth: Mucous membranes are moist.  Cardiovascular:     Rate and Rhythm: Normal rate and regular rhythm.     Heart sounds: Murmur heard.  Pulmonary:     Effort: Pulmonary effort is normal.     Breath sounds: Normal breath sounds.  Abdominal:     Palpations: Abdomen is soft.     Tenderness: There is no abdominal tenderness.  Musculoskeletal:        General: Normal range of motion.     Comments: Low back pain  Skin:    General: Skin is warm.  Neurological:     Mental Status: She is alert and oriented to person, place, and time.  Psychiatric:        Behavior: Behavior normal.     Imaging: No results found.  Labs:  CBC: Recent Labs    01/25/22 1033 03/09/22 1239 03/11/22 0042 07/05/22 0848  WBC 5.4 7.1 7.9 6.1  HGB 9.8* 10.4* 9.4* 10.1*  HCT 28.5* 30.6* 27.6* 29.6*  PLT 177 198 178 165    COAGS: No results for input(s): "INR", "APTT" in the last 8760 hours.  BMP: Recent Labs    01/25/22 1033 03/09/22 1239 03/11/22 0042  NA 141  141 135  K 4.0 3.8 4.1  CL 106 104 99  CO2 _1 GLUCOSE 156* 153* 296*  BUN 34* 35* 40*  CALCIUM 9.3 9.1 9.0  CREATININE 1.69* 1.67* 1.59*  GFRNONAA 30* 31* 33*    LIVER FUNCTION TESTS: No results for input(s): "BILITOT", "AST", "ALT", "ALKPHOS", "PROT", "ALBUMIN" in the last 8760 hours.  TUMOR MARKERS: No results for input(s): "AFPTM", "CEA", "CA199", "CHROMGRNA" in the last 8760 hours.  Assessment and Plan:  Lumbar 4-5 discitis Possible osteomyelitis per MD Scheduled now for L4-5 disc aspiration Risks and benefits of Lumbar 4-5 disc aspiration was discussed with the patient and/or patient's family including, but not limited to bleeding, infection, damage to adjacent structures or low yield requiring additional tests.  All of the questions were answered and there is agreement to proceed.  Consent signed and in chart.  Thank you for this interesting consult.  I greatly enjoyed meeting KARENA KINKER and look forward to participating in their care.  A copy of this report was sent to the requesting provider on this date.  Electronically Signed: Lavonia Drafts, PA-C 07/05/2022, 9:25 AM   I spent a total of  30 Minutes   in face to face in clinical consultation, greater than 50% of which was counseling/coordinating care for L4-5 disc aspiration

## 2022-07-05 NOTE — Procedures (Signed)
INR. Status post L4 core biopsy under fluoroscopic guidance.  Right transpedicular approach.  2 passes made with  a 16 G core biopsy needle.  Samples obtained sent for  pathologic analysis as per  requesting physician. No acute complications.  Arlean Hopping MD

## 2022-07-05 NOTE — Progress Notes (Signed)
Patient was given discharge instructions. She verbalized understanding. 

## 2022-07-06 LAB — SURGICAL PATHOLOGY

## 2022-07-13 ENCOUNTER — Other Ambulatory Visit: Payer: Self-pay | Admitting: Neurological Surgery

## 2022-07-19 NOTE — Pre-Procedure Instructions (Signed)
Surgical Instructions    Your procedure is scheduled on Tuesday 07/25/22.   Report to [facility] Main Entrance "A" at 11:00 A.M., then check in with the Admitting office.  Call this number if you have problems the morning of surgery:  [phone]   If you have any questions prior to your surgery date call [phone]: Open Monday-Friday 8am-4pm If you experience any cold or flu symptoms such as cough, fever, chills, shortness of breath, etc. between now and your scheduled surgery, please notify us at the above number     Remember:  Do not eat after midnight the night before your surgery  You may drink clear liquids until 10:00 A.M. the morning of your surgery.   Clear liquids allowed are: Water, Non-Citrus Juices (without pulp), Carbonated Beverages, Clear Tea, Black Coffee ONLY (NO MILK, CREAM OR POWDERED CREAMER of any kind), and Gatorade    Take these medicines the morning of surgery with A SIP OF WATER:    cetirizine (ZYRTEC)   pantoprazole (PROTONIX)     Take these medicines if needed:   acetaminophen (TYLENOL)   albuterol (VENTOLIN HFA)   beclomethasone (QVAR)   Please follow your surgeon's instructions regarding clopidogrel (PLAVIX). If you have not received instructions then please contact your surgeon's office for instructions.   As of today, STOP taking any Aspirin (unless otherwise instructed by your surgeon) Aleve, Naproxen, Ibuprofen, Motrin, Advil, Goody's, BC's, all herbal medications, fish oil, diclofenac Sodium (VOLTAREN), and all vitamins.  WHAT DO I DO ABOUT MY DIABETES MEDICATION?   Do not take oral diabetes medicines (pills) the morning of surgery.  DO NOT TAKE glipiZIDE (GLUCOTROL XL) the morning of surgery.    The day of surgery, do not take other diabetes injectables, including Byetta (exenatide), Bydureon (exenatide ER), Victoza (liraglutide), or Trulicity (dulaglutide).   HOW TO MANAGE YOUR DIABETES BEFORE AND AFTER SURGERY  Why is it  important to control my blood sugar before and after surgery? Improving blood sugar levels before and after surgery helps healing and can limit problems. A way of improving blood sugar control is eating a healthy diet by:  Eating less sugar and carbohydrates  Increasing activity/exercise  Talking with your doctor about reaching your blood sugar goals High blood sugars (greater than 180 mg/dL) can raise your risk of infections and slow your recovery, so you will need to focus on controlling your diabetes during the weeks before surgery. Make sure that the doctor who takes care of your diabetes knows about your planned surgery including the date and location.  How do I manage my blood sugar before surgery? Check your blood sugar at least 4 times a day, starting 2 days before surgery, to make sure that the level is not too high or low.  Check your blood sugar the morning of your surgery when you wake up and every 2 hours until you get to the Short Stay unit.  If your blood sugar is less than 70 mg/dL, you will need to treat for low blood sugar: Do not take insulin. Treat a low blood sugar (less than 70 mg/dL) with  cup of clear juice (cranberry or apple), 4 glucose tablets, OR glucose gel. Recheck blood sugar in 15 minutes after treatment (to make sure it is greater than 70 mg/dL). If your blood sugar is not greater than 70 mg/dL on recheck, call [phone] for further instructions. Report your blood sugar to the short stay nurse when you get to Short Stay.  If you are  admitted to the hospital after surgery: Your blood sugar will be checked by the staff and you will probably be given insulin after surgery (instead of oral diabetes medicines) to make sure you have good blood sugar levels. The goal for blood sugar control after surgery is 80-180 mg/dL.           Do not wear jewelry or makeup. Do not wear lotions, powders, perfumes/cologne or deodorant. Do not shave 48 hours prior to surgery.   Men may shave face and neck. Do not bring valuables to the hospital. Do not wear nail polish, gel polish, artificial nails, or any other type of covering on natural nails (fingers and toes) If you have artificial nails or gel coating that need to be removed by a nail salon, please have this removed prior to surgery. Artificial nails or gel coating may interfere with anesthesia's ability to adequately monitor your vital signs.  [facility] is not responsible for any belongings or valuables.    Do NOT Smoke (Tobacco/Vaping)  24 hours prior to your procedure  If you use a CPAP at night, you may bring your mask for your overnight stay.   Contacts, glasses, hearing aids, dentures or partials may not be worn into surgery, please bring cases for these belongings   For patients admitted to the hospital, discharge time will be determined by your treatment team.   Patients discharged the day of surgery will not be allowed to drive home, and someone needs to stay with them for 24 hours.   SURGICAL WAITING ROOM VISITATION Patients having surgery or a procedure may have no more than 2 support people in the waiting area - these visitors may rotate.   Children under the age of 23 must have an adult with them who is not the patient. If the patient needs to stay at the hospital during part of their recovery, the visitor guidelines for inpatient rooms apply. Pre-op nurse will coordinate an appropriate time for 1 support person to accompany patient in pre-op.  This support person may not rotate.   Please refer to [email or web address] for the visitor guidelines for Inpatients (after your surgery is over and you are in a regular room).    Special instructions:    Oral Hygiene is also important to reduce your risk of infection.  Remember - BRUSH YOUR TEETH THE MORNING OF SURGERY WITH YOUR REGULAR TOOTHPASTE   [facility]- Preparing For Surgery  Before  surgery, you can play an important role. Because skin is not sterile, your skin needs to be as free of germs as possible. You can reduce the number of germs on your skin by washing with CHG (chlorahexidine gluconate) Soap before surgery.  CHG is an antiseptic cleaner which kills germs and bonds with the skin to continue killing germs even after washing.     Please do not use if you have an allergy to CHG or antibacterial soaps. If your skin becomes reddened/irritated stop using the CHG.  Do not shave (including legs and underarms) for at least 48 hours prior to first CHG shower. It is OK to shave your face.  Please follow these instructions carefully.     Shower the NIGHT BEFORE SURGERY and the MORNING OF SURGERY with CHG Soap.   If you chose to wash your hair, wash your hair first as usual with your normal shampoo. After you shampoo, rinse your hair and body thoroughly to remove the shampoo.  Then [employer] and genitals (private parts) with  your normal soap and rinse thoroughly to remove soap.  After that Use CHG Soap as you would any other liquid soap. You can apply CHG directly to the skin and wash gently with a scrungie or a clean washcloth.   Apply the CHG Soap to your body ONLY FROM THE NECK DOWN.  Do not use on open wounds or open sores. Avoid contact with your eyes, ears, mouth and genitals (private parts). Wash Face and genitals (private parts)  with your normal soap.   Wash thoroughly, paying special attention to the area where your surgery will be performed.  Thoroughly rinse your body with warm water from the neck down.  DO NOT shower/wash with your normal soap after using and rinsing off the CHG Soap.  Pat yourself dry with a CLEAN TOWEL.  Wear CLEAN PAJAMAS to bed the night before surgery  Place CLEAN SHEETS on your bed the night before your surgery  DO NOT SLEEP WITH PETS.   Day of Surgery:  Take a shower with CHG soap. Wear Clean/Comfortable clothing the morning of  surgery Do not apply any deodorants/lotions.   Remember to brush your teeth WITH YOUR REGULAR TOOTHPASTE.    If you received a COVID test during your pre-op visit, it is requested that you wear a mask when out in public, stay away from anyone that may not be feeling well, and notify your surgeon if you develop symptoms. If you have been in contact with anyone that has tested positive in the last 10 days, please notify your surgeon.    Please read over the following fact sheets that you were given.

## 2022-07-20 ENCOUNTER — Encounter (HOSPITAL_COMMUNITY)
Admission: RE | Admit: 2022-07-20 | Discharge: 2022-07-20 | Disposition: A | Discharge status: Home / Self Care | Payer: Medicare PPO | Source: Ambulatory Visit | Attending: Neurological Surgery | Admitting: Neurological Surgery

## 2022-07-20 ENCOUNTER — Other Ambulatory Visit: Payer: Self-pay

## 2022-07-20 ENCOUNTER — Encounter (HOSPITAL_COMMUNITY): Payer: Self-pay

## 2022-07-20 VITALS — BP 152/78 | HR 75 | Temp 97.8°F | Resp 17 | Ht 65.0 in | Wt 161.8 lb

## 2022-07-20 DIAGNOSIS — E119 Type 2 diabetes mellitus without complications: Secondary | ICD-10-CM

## 2022-07-20 DIAGNOSIS — Z01812 Encounter for preprocedural laboratory examination: Secondary | ICD-10-CM | POA: Diagnosis present

## 2022-07-20 DIAGNOSIS — Z01818 Encounter for other preprocedural examination: Secondary | ICD-10-CM

## 2022-07-20 LAB — SURGICAL PCR SCREEN
MRSA, PCR: NEGATIVE
Staphylococcus aureus: NEGATIVE

## 2022-07-20 LAB — TYPE AND SCREEN
ABO/RH(D): A POS
Antibody Screen: NEGATIVE

## 2022-07-20 LAB — GLUCOSE, CAPILLARY: Glucose-Capillary: 182 mg/dL — ABNORMAL HIGH (ref 70–99)

## 2022-07-20 NOTE — Progress Notes (Addendum)
PCP - Sheri Beal Cardiologist - Peter Martinique Neurolgoist- Narda Amber  PPM/ICD - N/A  Chest x-ray - 03/10/22 EKG - 03/15/22 Stress Test - no recent stress test ECHO - 02/21/22 Cardiac Cath - n/a  Sleep Study - n/a   Fasting Blood Sugar - Pt CBG 182 at PAT. Pt reports blood sugars are normally around 130. Pt checks her blood sugar daily. Last Hgb A1c 6.9 on 07/18/22 in care everywhere.   Last dose of GLP1 agonist-  N/A  Blood Thinner Instructions: Per surgeon pt stopped plavix 5 days prior to surgery. Last dose on 07/18/22.  Aspirin Instructions: N/A  ERAS Protcol - ERAS per order, no drink   COVID TEST- N/A   Anesthesia review: yes, notified James during PAT visit of pt stating she has a history of heart murmur. Pt got cardiac clearance prior to surgery in August. Review lab work from 07/18/22.   Patient denies shortness of breath, fever, cough and chest pain at PAT appointment   All instructions explained to the patient, with a verbal understanding of the material. Patient agrees to go over the instructions while at home for a better understanding.The opportunity to ask questions was provided.

## 2022-07-21 NOTE — Anesthesia Preprocedure Evaluation (Addendum)
Anesthesia Evaluation  Patient identified by MRN, date of birth, ID band Patient awake    Reviewed: Allergy & Precautions, NPO status , Patient's Chart, lab work & pertinent test results  History of Anesthesia Complications Negative for: history of anesthetic complications  Airway Mallampati: I  TM Distance: >3 FB Neck ROM: Full    Dental  (+) Edentulous Lower, Dental Advisory Given, Missing,    Pulmonary asthma , former smoker   breath sounds clear to auscultation       Cardiovascular hypertension, Pt. on medications (-) angina (-) Past MI  Rhythm:Regular  1. Left ventricular ejection fraction, by estimation, is 50 to 55%. Left  ventricular ejection fraction by 3D volume is 50 %. The left ventricle has  low normal function. Difficult to assess wall motion due to incomplete  visualization of the LV  endocardium. Septal motion is abnormal in the setting of a LBBB. There is  mild concentric left ventricular hypertrophy. Left ventricular diastolic  parameters are consistent with Grade I diastolic dysfunction (impaired  relaxation).   2. Right ventricular systolic function is normal. The right ventricular  size is normal. There is normal pulmonary artery systolic pressure. The  estimated right ventricular systolic pressure is 58.0 mmHg.   3. The mitral valve is normal in structure. Trivial mitral valve  regurgitation.   4. The aortic valve is tricuspid. Aortic valve regurgitation is not  visualized. Aortic valve sclerosis is present, with no evidence of aortic  valve stenosis.   5. The inferior vena cava is normal in size with greater than 50%  respiratory variability, suggesting right atrial pressure of 3 mmHg.     Neuro/Psych  PSYCHIATRIC DISORDERS  Depression    Stroke 5 years ago residual right sided weakness  Neuromuscular disease CVA    GI/Hepatic Neg liver ROS,GERD  Medicated and Controlled,,  Endo/Other  diabetes  Lab  Results      Component                Value               Date                      HGBA1C                   7.2 (H)             01/25/2022             Renal/GU CRFRenal diseaseLab Results      Component                Value               Date                      CREATININE               1.59 (H)            03/11/2022                Musculoskeletal   Abdominal   Peds  Hematology  (+) Blood dyscrasia, anemia Lab Results      Component                Value               Date  WBC                      6.1                 07/05/2022                HGB                      10.1 (L)            07/05/2022                HCT                      29.6 (L)            07/05/2022                MCV                      100.0               07/05/2022                PLT                      165                 07/05/2022             Plavix (last dose 07/18/22)   Anesthesia Other Findings   Reproductive/Obstetrics                              Anesthesia Physical Anesthesia Plan  ASA: 3  Anesthesia Plan: General   Post-op Pain Management: Ofirmev IV (intra-op)*   Induction: Intravenous  PONV Risk Score and Plan: 3 and Ondansetron, Dexamethasone, Propofol infusion and TIVA  Airway Management Planned: Oral ETT  Additional Equipment: None  Intra-op Plan:   Post-operative Plan: Extubation in OR  Informed Consent: I have reviewed the patients History and Physical, chart, labs and discussed the procedure including the risks, benefits and alternatives for the proposed anesthesia with the patient or authorized representative who has indicated his/her understanding and acceptance.     Dental advisory given  Plan Discussed with: CRNA  Anesthesia Plan Comments: (PAT note by Karoline Caldwell, PA-C: History of HTN, HLD, CVA, chronic DOE, left bundle branch block.  Seen by cardiologist Dr. Peter Martinique 02/10/2022 to establish care as well as for  preop eval.  Per note, "Dyspnea. Chronic LBBB. For surgical clearance for lumbar surgery. Recommend Echo. If LV function OK will clear her to proceed with surgery. No active angina or history of MI."  Echo 02/21/2022 showed EF 50 to 55%, grade 1 dd, no significant valvular abnormalities.  Dr. Martinique commented on results stating patient cleared to proceed with lumbar surgery.  Patient did subsequently go L4-5 laminectomy with [name] on 03/09/2022.  He developed a postop hematoma and had evacuation by Dr. Trenton Gammon on 03/11/2022.  He subsequently did well for several weeks but fell at home and then had a lumbar 1 kyphoplasty with [name] on 04/06/2022.  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  He underwent an L4 bone biopsy 07/05/2022 by interventional radiology which was benign.  Patient seen by PCP Barbera Setters  Loyal Buba, NP 07/18/2022 for preop evaluation.  He was cleared for surgery at that time and advised to hold Plavix 5 days prior.  CBC at that time showed anemia with hemoglobin 10.1 which appears to be stable and near his baseline.  CMP showed creatinine 1.58 consistent with history of CKD 3.  Labs were otherwise unremarkable.  Patient reported last dose Plavix 07/18/2022.   Non-insulin-dependent DM2, A1c 6.9 on 07/18/2022.   Patient will need day of surgery labs and evaluation.   EKG 03/10/22: NSR. Rate 81. LBBB   TTE 02/21/2022:  1. Left ventricular ejection fraction, by estimation, is 50 to 55%. Left  ventricular ejection fraction by 3D volume is 50 %. The left ventricle has  low normal function. Difficult to assess wall motion due to incomplete  visualization of the LV  endocardium. Septal motion is abnormal in the setting of a LBBB. There is  mild concentric left ventricular hypertrophy. Left ventricular diastolic  parameters are consistent with Grade I diastolic dysfunction (impaired  relaxation).   2. Right ventricular systolic  function is normal. The right ventricular  size is normal. There is normal pulmonary artery systolic pressure. The  estimated right ventricular systolic pressure is 02.5 mmHg.   3. The mitral valve is normal in structure. Trivial mitral valve  regurgitation.   4. The aortic valve is tricuspid. Aortic valve regurgitation is not  visualized. Aortic valve sclerosis is present, with no evidence of aortic  valve stenosis.   5. The inferior vena cava is normal in size with greater than 50%  respiratory variability, suggesting right atrial pressure of 3 mmHg.    )         Anesthesia Quick Evaluation

## 2022-07-21 NOTE — Progress Notes (Signed)
Anesthesia Chart Review:  History of HTN, HLD, CVA, chronic DOE, left bundle branch block.  Seen by cardiologist Dr. Peter Martinique 02/10/2022 to establish care as well as for preop eval.  Per note, "Dyspnea. Chronic LBBB. For surgical clearance for lumbar surgery. Recommend Echo. If LV function OK will clear her to proceed with surgery. No active angina or history of MI."  Echo 02/21/2022 showed EF 50 to 55%, grade 1 dd, no significant valvular abnormalities.  Dr. Martinique commented on results stating patient cleared to proceed with lumbar surgery.  Patient did subsequently go L4-5 laminectomy with [name] on 03/09/2022.  He developed a postop hematoma and had evacuation by Dr. Trenton Gammon on 03/11/2022.  He subsequently did well for several weeks but fell at home and then had a lumbar 1 kyphoplasty with [name] on 04/06/2022.  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  He underwent an L4 bone biopsy 07/05/2022 by interventional radiology which was benign.  Patient seen by PCP [name], NP 07/18/2022 for preop evaluation.  He was cleared for surgery at that time and advised to hold Plavix 5 days prior.  CBC at that time showed anemia with hemoglobin 10.1 which appears to be stable and near his baseline.  CMP showed creatinine 1.58 consistent with history of CKD 3.  Labs were otherwise unremarkable.  Patient reported last dose Plavix 07/18/2022.   Non-insulin-dependent DM2, A1c 6.9 on 07/18/2022.   Patient will need day of surgery labs and evaluation.   EKG 03/10/22: NSR. Rate 81. LBBB   TTE 02/21/2022:  1. Left ventricular ejection fraction, by estimation, is 50 to 55%. Left  ventricular ejection fraction by 3D volume is 50 %. The left ventricle has  low normal function. Difficult to assess wall motion due to incomplete  visualization of the LV  endocardium. Septal motion is abnormal in the setting of a LBBB. There is  mild concentric left  ventricular hypertrophy. Left ventricular diastolic  parameters are consistent with Grade I diastolic dysfunction (impaired  relaxation).   2. Right ventricular systolic function is normal. The right ventricular  size is normal. There is normal pulmonary artery systolic pressure. The  estimated right ventricular systolic pressure is 56.3 mmHg.   3. The mitral valve is normal in structure. Trivial mitral valve  regurgitation.   4. The aortic valve is tricuspid. Aortic valve regurgitation is not  visualized. Aortic valve sclerosis is present, with no evidence of aortic  valve stenosis.   5. The inferior vena cava is normal in size with greater than 50%  respiratory variability, suggesting right atrial pressure of 3 mmHg.     Wynonia Musty [facility] Short Stay Center/Anesthesiology Phone [phone] 07/21/2022 12:19 PM

## 2022-07-25 ENCOUNTER — Ambulatory Visit (HOSPITAL_COMMUNITY): Payer: Medicare PPO

## 2022-07-25 ENCOUNTER — Other Ambulatory Visit: Payer: Self-pay

## 2022-07-25 ENCOUNTER — Ambulatory Visit (HOSPITAL_COMMUNITY): Payer: Medicare PPO | Admitting: Physician Assistant

## 2022-07-25 ENCOUNTER — Ambulatory Visit (HOSPITAL_BASED_OUTPATIENT_CLINIC_OR_DEPARTMENT_OTHER): Payer: Medicare PPO | Admitting: Physician Assistant

## 2022-07-25 ENCOUNTER — Encounter (HOSPITAL_COMMUNITY): Payer: Self-pay | Admitting: Neurological Surgery

## 2022-07-25 ENCOUNTER — Encounter (HOSPITAL_COMMUNITY)
Admission: RE | Disposition: A | Discharge status: Skilled Nursing Facility | Payer: Self-pay | Source: Home / Self Care | Attending: Neurological Surgery

## 2022-07-25 ENCOUNTER — Observation Stay (HOSPITAL_COMMUNITY)
Admission: RE | Admit: 2022-07-25 | Discharge: 2022-07-31 | Disposition: A | Discharge status: Skilled Nursing Facility | Payer: Medicare PPO | Attending: Neurological Surgery | Admitting: Neurological Surgery

## 2022-07-25 DIAGNOSIS — Z8673 Personal history of transient ischemic attack (TIA), and cerebral infarction without residual deficits: Secondary | ICD-10-CM | POA: Diagnosis not present

## 2022-07-25 DIAGNOSIS — M4316 Spondylolisthesis, lumbar region: Secondary | ICD-10-CM | POA: Diagnosis present

## 2022-07-25 DIAGNOSIS — M5416 Radiculopathy, lumbar region: Secondary | ICD-10-CM

## 2022-07-25 DIAGNOSIS — J45909 Unspecified asthma, uncomplicated: Secondary | ICD-10-CM

## 2022-07-25 DIAGNOSIS — N184 Chronic kidney disease, stage 4 (severe): Secondary | ICD-10-CM | POA: Diagnosis not present

## 2022-07-25 DIAGNOSIS — I129 Hypertensive chronic kidney disease with stage 1 through stage 4 chronic kidney disease, or unspecified chronic kidney disease: Secondary | ICD-10-CM | POA: Insufficient documentation

## 2022-07-25 DIAGNOSIS — E119 Type 2 diabetes mellitus without complications: Secondary | ICD-10-CM

## 2022-07-25 DIAGNOSIS — E1122 Type 2 diabetes mellitus with diabetic chronic kidney disease: Secondary | ICD-10-CM | POA: Insufficient documentation

## 2022-07-25 DIAGNOSIS — Z87891 Personal history of nicotine dependence: Secondary | ICD-10-CM

## 2022-07-25 DIAGNOSIS — Z79899 Other long term (current) drug therapy: Secondary | ICD-10-CM | POA: Insufficient documentation

## 2022-07-25 HISTORY — PX: TRANSFORAMINAL LUMBAR INTERBODY FUSION (TLIF) WITH PEDICLE SCREW FIXATION 1 LEVEL: SHX6141

## 2022-07-25 LAB — GLUCOSE, CAPILLARY
Glucose-Capillary: 151 mg/dL — ABNORMAL HIGH (ref 70–99)
Glucose-Capillary: 198 mg/dL — ABNORMAL HIGH (ref 70–99)

## 2022-07-25 SURGERY — TRANSFORAMINAL LUMBAR INTERBODY FUSION (TLIF) WITH PEDICLE SCREW FIXATION 1 LEVEL
Anesthesia: General | Site: Spine Lumbar

## 2022-07-25 MED ORDER — SODIUM CHLORIDE 0.9% FLUSH: 3.0000 mL | INTRAVENOUS | Status: DC | PRN

## 2022-07-25 MED ORDER — OXYCODONE HCL 5 MG PO TABS
5.0000 mg | ORAL_TABLET | ORAL | Status: DC | PRN
  Administered 2022-07-26 – 2022-07-31 (×15): 5 mg via ORAL
  Filled 2022-07-25 (×16): qty 1

## 2022-07-25 MED ORDER — PANTOPRAZOLE SODIUM 40 MG PO TBEC
40.0000 mg | DELAYED_RELEASE_TABLET | Freq: Every day | ORAL | Status: DC
  Administered 2022-07-26 – 2022-07-31 (×6): 40 mg via ORAL
  Filled 2022-07-25 (×6): qty 1

## 2022-07-25 MED ORDER — ONDANSETRON HCL 4 MG/2ML IJ SOLN
INTRAMUSCULAR | Status: AC
  Filled 2022-07-25: qty 2

## 2022-07-25 MED ORDER — ONDANSETRON HCL 4 MG PO TABS: 4.0000 mg | ORAL_TABLET | Freq: Four times a day (QID) | ORAL | Status: DC | PRN

## 2022-07-25 MED ORDER — FENTANYL CITRATE (PF) 250 MCG/5ML IJ SOLN
INTRAMUSCULAR | Status: DC | PRN
  Administered 2022-07-25: 25 ug via INTRAVENOUS
  Administered 2022-07-25: 50 ug via INTRAVENOUS
  Administered 2022-07-25: 100 ug via INTRAVENOUS
  Administered 2022-07-25 (×3): 25 ug via INTRAVENOUS

## 2022-07-25 MED ORDER — LIDOCAINE-EPINEPHRINE 1 %-1:100000 IJ SOLN
INTRAMUSCULAR | Status: DC | PRN
  Administered 2022-07-25: 5 mL

## 2022-07-25 MED ORDER — MONTELUKAST SODIUM 10 MG PO TABS
10.0000 mg | ORAL_TABLET | Freq: Every day | ORAL | Status: DC
  Administered 2022-07-25 – 2022-07-30 (×6): 10 mg via ORAL
  Filled 2022-07-25 (×6): qty 1

## 2022-07-25 MED ORDER — MENTHOL 3 MG MT LOZG: 1.0000 | LOZENGE | OROMUCOSAL | Status: DC | PRN

## 2022-07-25 MED ORDER — CEFAZOLIN SODIUM-DEXTROSE 2-4 GM/100ML-% IV SOLN
2.0000 g | INTRAVENOUS | Status: AC
  Administered 2022-07-25: 2 g via INTRAVENOUS
  Filled 2022-07-25: qty 100

## 2022-07-25 MED ORDER — CHLORHEXIDINE GLUCONATE CLOTH 2 % EX PADS: 6.0000 | MEDICATED_PAD | Freq: Once | CUTANEOUS | Status: DC

## 2022-07-25 MED ORDER — ATORVASTATIN CALCIUM 40 MG PO TABS
40.0000 mg | ORAL_TABLET | Freq: Every day | ORAL | Status: DC
  Administered 2022-07-25 – 2022-07-30 (×6): 40 mg via ORAL
  Filled 2022-07-25 (×6): qty 1

## 2022-07-25 MED ORDER — OXYCODONE-ACETAMINOPHEN 2.5-325 MG PO TABS: 1.0000 | ORAL_TABLET | Freq: Every day | ORAL | Status: DC

## 2022-07-25 MED ORDER — ONDANSETRON HCL 4 MG/2ML IJ SOLN: 4.0000 mg | Freq: Four times a day (QID) | INTRAMUSCULAR | Status: DC | PRN

## 2022-07-25 MED ORDER — BUDESONIDE 0.25 MG/2ML IN SUSP
RESPIRATORY_TRACT | Status: AC
  Filled 2022-07-25: qty 2

## 2022-07-25 MED ORDER — PROPOFOL 500 MG/50ML IV EMUL
INTRAVENOUS | Status: DC | PRN
  Administered 2022-07-25: 75 ug/kg/min via INTRAVENOUS

## 2022-07-25 MED ORDER — ALBUTEROL SULFATE HFA 108 (90 BASE) MCG/ACT IN AERS: 2.0000 | INHALATION_SPRAY | Freq: Four times a day (QID) | RESPIRATORY_TRACT | Status: DC | PRN

## 2022-07-25 MED ORDER — ORAL CARE MOUTH RINSE: 15.0000 mL | Freq: Once | OROMUCOSAL | Status: AC

## 2022-07-25 MED ORDER — DOCUSATE SODIUM 100 MG PO CAPS
100.0000 mg | ORAL_CAPSULE | Freq: Two times a day (BID) | ORAL | Status: DC
  Administered 2022-07-25 – 2022-07-31 (×12): 100 mg via ORAL
  Filled 2022-07-25 (×11): qty 1

## 2022-07-25 MED ORDER — PHENYLEPHRINE 80 MCG/ML (10ML) SYRINGE FOR IV PUSH (FOR BLOOD PRESSURE SUPPORT)
PREFILLED_SYRINGE | INTRAVENOUS | Status: AC
  Filled 2022-07-25: qty 10

## 2022-07-25 MED ORDER — ATROPINE SULFATE 0.4 MG/ML IV SOLN
INTRAVENOUS | Status: AC
  Filled 2022-07-25: qty 1

## 2022-07-25 MED ORDER — ACETAMINOPHEN 500 MG PO TABS: 1000.0000 mg | ORAL_TABLET | Freq: Once | ORAL | Status: DC | PRN

## 2022-07-25 MED ORDER — BUPIVACAINE HCL (PF) 0.5 % IJ SOLN
INTRAMUSCULAR | Status: AC
  Filled 2022-07-25: qty 30

## 2022-07-25 MED ORDER — ACETAMINOPHEN 325 MG PO TABS
650.0000 mg | ORAL_TABLET | ORAL | Status: DC | PRN
  Administered 2022-07-26 – 2022-07-30 (×3): 650 mg via ORAL
  Filled 2022-07-25 (×3): qty 2

## 2022-07-25 MED ORDER — ROCURONIUM BROMIDE 10 MG/ML (PF) SYRINGE
PREFILLED_SYRINGE | INTRAVENOUS | Status: AC
  Filled 2022-07-25: qty 10

## 2022-07-25 MED ORDER — SODIUM CHLORIDE 0.9 % IV SOLN
250.0000 mL | INTRAVENOUS | Status: DC
  Administered 2022-07-25: 250 mL via INTRAVENOUS

## 2022-07-25 MED ORDER — ACETAMINOPHEN 500 MG PO TABS: 500.0000 mg | ORAL_TABLET | Freq: Every evening | ORAL | Status: DC | PRN

## 2022-07-25 MED ORDER — ACETAMINOPHEN 10 MG/ML IV SOLN
INTRAVENOUS | Status: AC
  Administered 2022-07-25: 1000 mg via INTRAVENOUS
  Filled 2022-07-25: qty 100

## 2022-07-25 MED ORDER — OXYCODONE HCL 5 MG PO TABS
5.0000 mg | ORAL_TABLET | Freq: Once | ORAL | Status: AC | PRN
  Administered 2022-07-25: 5 mg via ORAL

## 2022-07-25 MED ORDER — POLYETHYLENE GLYCOL 3350 17 G PO PACK
17.0000 g | PACK | Freq: Every day | ORAL | Status: DC | PRN
  Administered 2022-07-29: 17 g via ORAL
  Filled 2022-07-25: qty 1

## 2022-07-25 MED ORDER — SERTRALINE HCL 100 MG PO TABS
100.0000 mg | ORAL_TABLET | Freq: Every day | ORAL | Status: DC
  Administered 2022-07-25 – 2022-07-30 (×6): 100 mg via ORAL
  Filled 2022-07-25 (×6): qty 1

## 2022-07-25 MED ORDER — EPHEDRINE 5 MG/ML INJ
INTRAVENOUS | Status: AC
  Filled 2022-07-25: qty 5

## 2022-07-25 MED ORDER — CEFAZOLIN SODIUM-DEXTROSE 2-4 GM/100ML-% IV SOLN
2.0000 g | Freq: Three times a day (TID) | INTRAVENOUS | Status: AC
  Administered 2022-07-25 (×2): 2 g via INTRAVENOUS
  Filled 2022-07-25 (×2): qty 100

## 2022-07-25 MED ORDER — FENTANYL CITRATE (PF) 100 MCG/2ML IJ SOLN
INTRAMUSCULAR | Status: AC
  Filled 2022-07-25: qty 2

## 2022-07-25 MED ORDER — DEXAMETHASONE SODIUM PHOSPHATE 10 MG/ML IJ SOLN
INTRAMUSCULAR | Status: DC | PRN
  Administered 2022-07-25: 5 mg via INTRAVENOUS

## 2022-07-25 MED ORDER — PROPOFOL 10 MG/ML IV BOLUS
INTRAVENOUS | Status: AC
  Filled 2022-07-25: qty 20

## 2022-07-25 MED ORDER — ONDANSETRON HCL 4 MG/2ML IJ SOLN
INTRAMUSCULAR | Status: DC | PRN
  Administered 2022-07-25: 4 mg via INTRAVENOUS

## 2022-07-25 MED ORDER — DIPHENHYDRAMINE-APAP (SLEEP) 25-500 MG PO TABS: 2.0000 | ORAL_TABLET | Freq: Every evening | ORAL | Status: DC | PRN

## 2022-07-25 MED ORDER — EPHEDRINE SULFATE-NACL 50-0.9 MG/10ML-% IV SOSY
PREFILLED_SYRINGE | INTRAVENOUS | Status: DC | PRN
  Administered 2022-07-25: 5 mg via INTRAVENOUS

## 2022-07-25 MED ORDER — MIDAZOLAM HCL 2 MG/2ML IJ SOLN
INTRAMUSCULAR | Status: AC
  Filled 2022-07-25: qty 2

## 2022-07-25 MED ORDER — PHENOL 1.4 % MT LIQD: 1.0000 | OROMUCOSAL | Status: DC | PRN

## 2022-07-25 MED ORDER — BUPIVACAINE HCL (PF) 0.5 % IJ SOLN
INTRAMUSCULAR | Status: DC | PRN
  Administered 2022-07-25: 5 mL

## 2022-07-25 MED ORDER — DIPHENHYDRAMINE HCL 25 MG PO CAPS: 25.0000 mg | ORAL_CAPSULE | Freq: Every evening | ORAL | Status: DC | PRN

## 2022-07-25 MED ORDER — PROPOFOL 1000 MG/100ML IV EMUL
INTRAVENOUS | Status: AC
  Filled 2022-07-25: qty 100

## 2022-07-25 MED ORDER — FENTANYL CITRATE (PF) 250 MCG/5ML IJ SOLN
INTRAMUSCULAR | Status: AC
  Filled 2022-07-25: qty 5

## 2022-07-25 MED ORDER — SODIUM CHLORIDE 0.9% FLUSH
3.0000 mL | Freq: Two times a day (BID) | INTRAVENOUS | Status: DC
  Administered 2022-07-25 – 2022-07-29 (×4): 3 mL via INTRAVENOUS

## 2022-07-25 MED ORDER — PROPOFOL 10 MG/ML IV BOLUS
INTRAVENOUS | Status: DC | PRN
  Administered 2022-07-25: 60 mg via INTRAVENOUS
  Administered 2022-07-25: 30 mg via INTRAVENOUS

## 2022-07-25 MED ORDER — OXYCODONE HCL 5 MG/5ML PO SOLN: 5.0000 mg | Freq: Once | ORAL | Status: AC | PRN

## 2022-07-25 MED ORDER — CYCLOBENZAPRINE HCL 10 MG PO TABS
10.0000 mg | ORAL_TABLET | Freq: Three times a day (TID) | ORAL | Status: DC | PRN
  Administered 2022-07-26 – 2022-07-30 (×4): 10 mg via ORAL
  Filled 2022-07-25 (×4): qty 1

## 2022-07-25 MED ORDER — OXYCODONE HCL 5 MG PO TABS
2.5000 mg | ORAL_TABLET | Freq: Every day | ORAL | Status: DC
  Administered 2022-07-25 – 2022-07-30 (×6): 2.5 mg via ORAL
  Filled 2022-07-25 (×6): qty 1

## 2022-07-25 MED ORDER — PHENYLEPHRINE HCL-NACL 20-0.9 MG/250ML-% IV SOLN
INTRAVENOUS | Status: DC | PRN
  Administered 2022-07-25: 10 ug/min via INTRAVENOUS

## 2022-07-25 MED ORDER — ACETAMINOPHEN 10 MG/ML IV SOLN: 1000.0000 mg | Freq: Once | INTRAVENOUS | Status: DC | PRN

## 2022-07-25 MED ORDER — DEXAMETHASONE SODIUM PHOSPHATE 10 MG/ML IJ SOLN
INTRAMUSCULAR | Status: AC
  Filled 2022-07-25: qty 1

## 2022-07-25 MED ORDER — ACETAMINOPHEN 325 MG PO TABS
325.0000 mg | ORAL_TABLET | Freq: Every day | ORAL | Status: DC
  Administered 2022-07-25 – 2022-07-29 (×5): 325 mg via ORAL
  Filled 2022-07-25 (×5): qty 1

## 2022-07-25 MED ORDER — LORATADINE 10 MG PO TABS
10.0000 mg | ORAL_TABLET | Freq: Every day | ORAL | Status: DC
  Administered 2022-07-26 – 2022-07-31 (×6): 10 mg via ORAL
  Filled 2022-07-25 (×6): qty 1

## 2022-07-25 MED ORDER — INSULIN ASPART 100 UNIT/ML IJ SOLN: 0.0000 [IU] | INTRAMUSCULAR | Status: DC | PRN

## 2022-07-25 MED ORDER — CHLORHEXIDINE GLUCONATE 0.12 % MT SOLN
15.0000 mL | Freq: Once | OROMUCOSAL | Status: AC
  Administered 2022-07-25: 15 mL via OROMUCOSAL
  Filled 2022-07-25: qty 15

## 2022-07-25 MED ORDER — ROCURONIUM BROMIDE 10 MG/ML (PF) SYRINGE
PREFILLED_SYRINGE | INTRAVENOUS | Status: DC | PRN
  Administered 2022-07-25: 20 mg via INTRAVENOUS
  Administered 2022-07-25: 60 mg via INTRAVENOUS

## 2022-07-25 MED ORDER — GABAPENTIN 300 MG PO CAPS
300.0000 mg | ORAL_CAPSULE | Freq: Every day | ORAL | Status: DC
  Administered 2022-07-25 – 2022-07-30 (×6): 300 mg via ORAL
  Filled 2022-07-25 (×6): qty 1

## 2022-07-25 MED ORDER — HYDROMORPHONE HCL 1 MG/ML IJ SOLN
1.0000 mg | INTRAMUSCULAR | Status: DC | PRN
  Administered 2022-07-29: 1 mg via INTRAVENOUS
  Filled 2022-07-25 (×4): qty 1

## 2022-07-25 MED ORDER — LOSARTAN POTASSIUM 50 MG PO TABS
50.0000 mg | ORAL_TABLET | Freq: Every day | ORAL | Status: DC
  Administered 2022-07-25 – 2022-07-31 (×7): 50 mg via ORAL
  Filled 2022-07-25 (×7): qty 1

## 2022-07-25 MED ORDER — BUDESONIDE 0.25 MG/2ML IN SUSP
0.2500 mg | Freq: Two times a day (BID) | RESPIRATORY_TRACT | Status: DC
  Administered 2022-07-25 – 2022-07-31 (×11): 0.25 mg via RESPIRATORY_TRACT
  Filled 2022-07-25 (×11): qty 2

## 2022-07-25 MED ORDER — LIDOCAINE 2% (20 MG/ML) 5 ML SYRINGE
INTRAMUSCULAR | Status: AC
  Filled 2022-07-25: qty 5

## 2022-07-25 MED ORDER — THROMBIN 5000 UNITS EX SOLR
OROMUCOSAL | Status: DC | PRN
  Administered 2022-07-25: 5 mL via TOPICAL

## 2022-07-25 MED ORDER — GLIPIZIDE ER 2.5 MG PO TB24
2.5000 mg | ORAL_TABLET | Freq: Every morning | ORAL | Status: DC
  Administered 2022-07-26 – 2022-07-31 (×6): 2.5 mg via ORAL
  Filled 2022-07-25 (×8): qty 1

## 2022-07-25 MED ORDER — ACETAMINOPHEN 650 MG RE SUPP: 650.0000 mg | RECTAL | Status: DC | PRN

## 2022-07-25 MED ORDER — SUGAMMADEX SODIUM 200 MG/2ML IV SOLN
INTRAVENOUS | Status: DC | PRN
  Administered 2022-07-25: 200 mg via INTRAVENOUS

## 2022-07-25 MED ORDER — LACTATED RINGERS IV SOLN: INTRAVENOUS | Status: DC

## 2022-07-25 MED ORDER — ACETAMINOPHEN 160 MG/5ML PO SOLN: 1000.0000 mg | Freq: Once | ORAL | Status: DC | PRN

## 2022-07-25 MED ORDER — SUCCINYLCHOLINE CHLORIDE 200 MG/10ML IV SOSY
PREFILLED_SYRINGE | INTRAVENOUS | Status: AC
  Filled 2022-07-25: qty 10

## 2022-07-25 MED ORDER — LIDOCAINE 2% (20 MG/ML) 5 ML SYRINGE
INTRAMUSCULAR | Status: DC | PRN
  Administered 2022-07-25: 60 mg via INTRAVENOUS

## 2022-07-25 MED ORDER — FENTANYL CITRATE (PF) 100 MCG/2ML IJ SOLN: 25.0000 ug | INTRAMUSCULAR | Status: DC | PRN

## 2022-07-25 MED ORDER — 0.9 % SODIUM CHLORIDE (POUR BTL) OPTIME
TOPICAL | Status: DC | PRN
  Administered 2022-07-25 (×2): 1000 mL

## 2022-07-25 MED ORDER — HYDROCHLOROTHIAZIDE 25 MG PO TABS
25.0000 mg | ORAL_TABLET | Freq: Every day | ORAL | Status: DC
  Administered 2022-07-25 – 2022-07-31 (×7): 25 mg via ORAL
  Filled 2022-07-25 (×7): qty 1

## 2022-07-25 MED ORDER — OXYCODONE HCL 5 MG PO TABS
ORAL_TABLET | ORAL | Status: AC
  Filled 2022-07-25: qty 1

## 2022-07-25 MED ORDER — THROMBIN 5000 UNITS EX SOLR
CUTANEOUS | Status: AC
  Filled 2022-07-25: qty 5000

## 2022-07-25 MED ORDER — LIDOCAINE-EPINEPHRINE 1 %-1:100000 IJ SOLN
INTRAMUSCULAR | Status: AC
  Filled 2022-07-25: qty 1

## 2022-07-25 MED ORDER — PHENYLEPHRINE 80 MCG/ML (10ML) SYRINGE FOR IV PUSH (FOR BLOOD PRESSURE SUPPORT)
PREFILLED_SYRINGE | INTRAVENOUS | Status: DC | PRN
  Administered 2022-07-25 (×3): 80 ug via INTRAVENOUS

## 2022-07-25 SURGICAL SUPPLY — 75 items
BAG COUNTER SPONGE SURGICOUNT (BAG) ×2 IMPLANT
BAND RUBBER #18 3X1/16 STRL (MISCELLANEOUS) ×4 IMPLANT
BASKET BONE COLLECTION (BASKET) ×2 IMPLANT
BENZOIN TINCTURE PRP APPL 2/3 (GAUZE/BANDAGES/DRESSINGS) IMPLANT
BLADE CLIPPER SURG (BLADE) IMPLANT
BLADE SURG 11 STRL SS (BLADE) ×2 IMPLANT
BUR 14 MATCH 3 (BUR) IMPLANT
BUR MATCHSTICK NEURO 3.0 LAGG (BURR) ×2 IMPLANT
BUR MR8 14CM BALL SYMTRI 5 (BUR) IMPLANT
BUR PRECISION FLUTE 5.0 (BURR) ×2 IMPLANT
BURR 14 MATCH 3 (BUR) ×2
BURR MR8 14CM BALL SYMTRI 5 (BUR) ×2
CANISTER SUCT 3000ML PPV (MISCELLANEOUS) ×2 IMPLANT
CNTNR URN SCR LID CUP LEK RST (MISCELLANEOUS) ×2 IMPLANT
CONT SPEC 4OZ STRL OR WHT (MISCELLANEOUS) ×2
COVER BACK TABLE 60X90IN (DRAPES) ×2 IMPLANT
COVERAGE SUPPORT O-ARM STEALTH (MISCELLANEOUS) ×2 IMPLANT
DERMABOND ADVANCED .7 DNX12 (GAUZE/BANDAGES/DRESSINGS) ×2 IMPLANT
DRAPE C-ARM 42X72 X-RAY (DRAPES) IMPLANT
DRAPE C-ARMOR (DRAPES) IMPLANT
DRAPE LAPAROTOMY 100X72X124 (DRAPES) ×2 IMPLANT
DRAPE MICROSCOPE SLANT 54X150 (MISCELLANEOUS) ×2 IMPLANT
DRAPE SHEET LG 3/4 BI-LAMINATE (DRAPES) ×8 IMPLANT
DRAPE SURG 17X23 STRL (DRAPES) ×2 IMPLANT
DRAPE WARM FLUID 44X44 (DRAPES) IMPLANT
DRSG OPSITE POSTOP 4X6 (GAUZE/BANDAGES/DRESSINGS) IMPLANT
DURAPREP 26ML APPLICATOR (WOUND CARE) ×2 IMPLANT
ELECT REM PT RETURN 9FT ADLT (ELECTROSURGICAL) ×2
ELECTRODE REM PT RTRN 9FT ADLT (ELECTROSURGICAL) ×2 IMPLANT
FEE COVERAGE SUPPORT O-ARM (MISCELLANEOUS) ×2 IMPLANT
GAUZE 4X4 16PLY ~~LOC~~+RFID DBL (SPONGE) IMPLANT
GAUZE SPONGE 4X4 12PLY STRL (GAUZE/BANDAGES/DRESSINGS) IMPLANT
GLOVE BIOGEL PI IND STRL 7.5 (GLOVE) ×4 IMPLANT
GLOVE ECLIPSE 7.5 STRL STRAW (GLOVE) ×4 IMPLANT
GLOVE EXAM NITRILE LRG STRL (GLOVE) IMPLANT
GLOVE EXAM NITRILE XL STR (GLOVE) IMPLANT
GLOVE EXAM NITRILE XS STR PU (GLOVE) IMPLANT
GOWN STRL REUS W/ TWL LRG LVL3 (GOWN DISPOSABLE) ×8 IMPLANT
GOWN STRL REUS W/ TWL XL LVL3 (GOWN DISPOSABLE) IMPLANT
GOWN STRL REUS W/TWL 2XL LVL3 (GOWN DISPOSABLE) IMPLANT
GOWN STRL REUS W/TWL LRG LVL3 (GOWN DISPOSABLE) ×8
GOWN STRL REUS W/TWL XL LVL3 (GOWN DISPOSABLE) ×6
HEMOSTAT POWDER KIT SURGIFOAM (HEMOSTASIS) ×2 IMPLANT
KIT BASIN OR (CUSTOM PROCEDURE TRAY) ×2 IMPLANT
KIT INFUSE X SMALL 1.4CC (Orthopedic Implant) IMPLANT
KIT POSITION SURG JACKSON T1 (MISCELLANEOUS) ×2 IMPLANT
KIT TURNOVER KIT B (KITS) ×2 IMPLANT
MARKER SPHERE PSV REFLC NDI (MISCELLANEOUS) ×10 IMPLANT
MILL BONE PREP (MISCELLANEOUS) IMPLANT
NDL HYPO 18GX1.5 BLUNT FILL (NEEDLE) IMPLANT
NDL SPNL 18GX3.5 QUINCKE PK (NEEDLE) IMPLANT
NEEDLE HYPO 18GX1.5 BLUNT FILL (NEEDLE) IMPLANT
NEEDLE HYPO 22GX1.5 SAFETY (NEEDLE) ×2 IMPLANT
NEEDLE SPNL 18GX3.5 QUINCKE PK (NEEDLE) ×2 IMPLANT
NS IRRIG 1000ML POUR BTL (IV SOLUTION) ×2 IMPLANT
PACK LAMINECTOMY NEURO (CUSTOM PROCEDURE TRAY) ×2 IMPLANT
PAD ARMBOARD 7.5X6 YLW CONV (MISCELLANEOUS) ×6 IMPLANT
ROD SPINAL CVD CCM 5.5X35 (Rod) IMPLANT
SCREW SET SOLERA (Screw) ×8 IMPLANT
SCREW SET SOLERA TI5.5 (Screw) IMPLANT
SCREW SOLERA 40X6.5XMA (Screw) IMPLANT
SCREW SOLERA 6.5X40MM (Screw) ×8 IMPLANT
SPIKE FLUID TRANSFER (MISCELLANEOUS) ×2 IMPLANT
SPONGE SURGIFOAM ABS GEL 100 (HEMOSTASIS) IMPLANT
SPONGE T-LAP 4X18 ~~LOC~~+RFID (SPONGE) IMPLANT
STRIP CLOSURE SKIN 1/2X4 (GAUZE/BANDAGES/DRESSINGS) IMPLANT
SUT MNCRL AB 3-0 PS2 18 (SUTURE) ×2 IMPLANT
SUT VIC AB 0 CT1 18XCR BRD8 (SUTURE) ×2 IMPLANT
SUT VIC AB 0 CT1 8-18 (SUTURE) ×2
SUT VIC AB 2-0 CP2 18 (SUTURE) ×2 IMPLANT
SYR 3ML LL SCALE MARK (SYRINGE) IMPLANT
TOWEL GREEN STERILE (TOWEL DISPOSABLE) ×2 IMPLANT
TOWEL GREEN STERILE FF (TOWEL DISPOSABLE) ×2 IMPLANT
TRAY FOLEY MTR SLVR 16FR STAT (SET/KITS/TRAYS/PACK) ×2 IMPLANT
WATER STERILE IRR 1000ML POUR (IV SOLUTION) ×2 IMPLANT

## 2022-07-25 NOTE — Anesthesia Procedure Notes (Signed)
Procedure Name: Intubation Date/Time: 07/25/2022 8:09 AM  Performed by: Donnie Mesa, RNPre-anesthesia Checklist: Patient identified, Emergency Drugs available, Suction available and Patient being monitored Patient Re-evaluated:Patient Re-evaluated prior to induction Oxygen Delivery Method: Circle system utilized Preoxygenation: Pre-oxygenation with 100% oxygen Induction Type: IV induction Ventilation: Mask ventilation without difficulty Laryngoscope Size: Mac and 3 Grade View: Grade I Tube type: Oral Tube size: 7.0 mm Number of attempts: 1 Airway Equipment and Method: Stylet and Bite block Placement Confirmation: ETT inserted through vocal cords under direct vision, positive ETCO2 and breath sounds checked- equal and bilateral Secured at: 23 cm Tube secured with: Tape Dental Injury: Teeth and Oropharynx as per pre-operative assessment

## 2022-07-25 NOTE — Op Note (Addendum)
PATIENT: Teresa Daniels  DAY OF SURGERY: 07/25/22   PRE-OPERATIVE DIAGNOSIS:  Lumbar radiculopathy, lumbar spondylolisthesis   POST-OPERATIVE DIAGNOSIS:  Same   PROCEDURE:  L4-5 open laminectomy, L4-5 posterolateral instrumented fusion; use of intraoperative CT and frameless stereotactic navigation   SURGEON:  Surgeon(s) and Role:    Judith Part, MD - Primary    Norm Parcel, [place] - Assisting    Duffy Rhody, MD - Assisting   ANESTHESIA: ETGA   BRIEF HISTORY: This is an 82 year old woman, well known to me, in whom I previously performed an MIS decompression with good results. Her symptoms recurred with worsening back pain and repeat imaging showed recurrent stenosis at L4-5 with a spondylolisthesis. We discussed this at length, given her age and frailty and she wished to proceed with surgical intervention.    OPERATIVE DETAIL: The patient was taken to the operating room and anesthesia was induced by the anesthesia team. They were placed on the OR table in the prone position with padding of all pressure points. A formal time out was performed with two patient identifiers and confirmed the operative site. The operative site was marked, hair was clipped with surgical clippers, the area was then prepped and draped in a sterile fashion. Fluoro was used to localize the operative level and a midline incision was placed to expose from L4 to L5. Subperiosteal dissection was performed bilaterally and fluoroscopy was again used to confirm the surgical level.   A spinous process clamp was applied and secured, followed by attachment of a reference array. The field was draped and the O-arm was brought into the field. An intra-op CT was performed, sent to the Stealth navigation station, registered to the patient's anatomy, and confirmed with landmarks with acceptable fit. Stereotactic spinal navigation was utilized throughout the procedure for planning and placement of pedicle screw  trajectories.  Instrumentation was then performed. Stereotaxy was used to guide placement of bilateral pedicle screws ([employer]) at L4 and L5. These were placed with navigated instruments by localizing the pedicle, drilling a pilot hole, cannulating the pedicle with an awl-tap, palpating for pedicle wall breaches, and then placing the screw. Given her prior compression fracture, I used cannulated screws, but actually got good purchase and pull-out strength, so I did not think cement augmentation was necessary. Given her bone quality, I did decide to abandon the TLIF due to concern for graft subsidence and just perform a posterolateral fusion.  Decompression was performed, which consisted of a decompressive laminectomy with bilateral partial facetectomies to decompress the lateral recesses. This was difficult, as expected, due to scar tissue from prior surgeries. It was impressively stenotic, mainly due to severe facet hypertrophy and large bilateral facetectomies were performed to get a wide decompression.  The pedicle screws connected with rods bilaterally and final tightened according to manufacturer torque specifications. The bone was thoroughly decorticated over the remaining facet and transverse processes and the previously resected bone fragments were morselized and used as autograft to complete a posterolateral instrumented fusion at L4-5 with rBMP along the posterolateral surfaces as well to augment bone growth.   Hemostasis was then obtained. Consistent with her history, there was some diffuse oozing at the end of the case, which resolved with floseal placement in the gutters. The wound was irrigated and hemostasis was confirmed.  All instrument and sponge counts were correct, the incision was then closed in layers. The patient was then returned to anesthesia for emergence. No apparent complications at the completion of the  procedure.   EBL:  111mL   DRAINS: none   SPECIMENS: none    Judith Part, MD 07/25/22 8:09 AM

## 2022-07-25 NOTE — Transfer of Care (Signed)
Immediate Anesthesia Transfer of Care Note  Patient: Teresa Daniels  Procedure(s) Performed: Lumbar four-five Open Laminectomy/Transforaminal Lumbar Interbody Fusion/Posterolateral instrumented fusion (Spine Lumbar) Application of O-Arm  Patient Location: PACU  Anesthesia Type:General  Level of Consciousness: awake and drowsy  Airway & Oxygen Therapy: Patient Spontanous Breathing  Post-op Assessment: Report given to RN  Post vital signs: Reviewed and stable  Last Vitals:  Vitals Value Taken Time  BP 186/94 07/25/22 1045  Temp    Pulse 87 07/25/22 1048  Resp 16 07/25/22 1048  SpO2 98 % 07/25/22 1048  Vitals shown include unvalidated device data.  Last Pain:  Vitals:   07/25/22 0612  TempSrc:   PainSc: 4       Patients Stated Pain Goal: 0 (74/16/38 4536)  Complications: No notable events documented.

## 2022-07-25 NOTE — Anesthesia Postprocedure Evaluation (Signed)
Anesthesia Post Note  Patient: CLARETHA TOWNSHEND  Procedure(s) Performed: Lumbar four-five Open Laminectomy/Transforaminal Lumbar Interbody Fusion/Posterolateral instrumented fusion (Spine Lumbar) Application of O-Arm     Patient location during evaluation: PACU Anesthesia Type: General Level of consciousness: awake and alert Pain management: pain level controlled Vital Signs Assessment: post-procedure vital signs reviewed and stable Respiratory status: spontaneous breathing, nonlabored ventilation and respiratory function stable Cardiovascular status: blood pressure returned to baseline and stable Postop Assessment: no apparent nausea or vomiting Anesthetic complications: no   No notable events documented.  Last Vitals:  Vitals:   07/25/22 1245 07/25/22 1312  BP: (!) 144/99 (!) 148/79  Pulse: 75 81  Resp: 12   Temp:  (!) 36.4 C  SpO2: 97% 91%    Last Pain:  Vitals:   07/25/22 1313  TempSrc:   PainSc: 0-No pain                 Mikenna Bunkley

## 2022-07-25 NOTE — H&P (Signed)
Surgical H&P Update  HPI: 82 y.o. with a history of low back and RLE pain, prior MIS lami and kypho. No changes in health since they were last seen, having some worsened RLE pain, feels similar to her but discussed this morning that the newer pain sounds possibly c/w a hip pathology, still having LBP and radicular pain with RLE > LLE numbness.  PMHx:  Past Medical History:  Diagnosis Date   Anemia    Arthritis    Asthma    CKD (chronic kidney disease) stage 4, GFR 15-29 ml/min (HCC)    Depression    Diabetes mellitus without complication (HCC)    GERD (gastroesophageal reflux disease)    Hypertension    Stroke ([place])    2019 per patient   Wears dentures    lowers, upper partial   Wears hearing aid    FamHx: History reviewed. No pertinent family history. SocHx:  reports that she quit smoking about 55 years ago. Her smoking use included cigarettes. She has never used smokeless tobacco. She reports that she does not currently use alcohol. She reports that she does not use drugs.  Physical Exam: Strength 5/5 x4, mild pain w/ int/ext rotation, SILTx4   Assesment/Plan: 82 y.o. woman with spondylolisthesis and stenosis, here for open decompression and instrumented fusion. Risks, benefits, and alternatives discussed and the patient would like to continue with surgery.  -OR today -3C post-op  Judith Part, MD 07/25/22 7:43 AM

## 2022-07-26 ENCOUNTER — Encounter (HOSPITAL_COMMUNITY): Payer: Self-pay | Admitting: Neurological Surgery

## 2022-07-26 DIAGNOSIS — M4316 Spondylolisthesis, lumbar region: Secondary | ICD-10-CM | POA: Diagnosis not present

## 2022-07-26 MED ORDER — CLOPIDOGREL BISULFATE 75 MG PO TABS: 75.0000 mg | ORAL_TABLET | Freq: Every morning | ORAL | Status: AC

## 2022-07-26 MED ORDER — OXYCODONE HCL 5 MG PO TABS: 5.0000 mg | ORAL_TABLET | ORAL | 0 refills | Status: DC | PRN

## 2022-07-26 MED ORDER — CYCLOBENZAPRINE HCL 10 MG PO TABS: 10.0000 mg | ORAL_TABLET | Freq: Three times a day (TID) | ORAL | 0 refills | Status: DC | PRN

## 2022-07-26 NOTE — TOC Initial Note (Addendum)
Transition of Care [facility]) - Initial/Assessment Note    Patient Details  Name: Teresa Daniels MRN: [id number] Date of Birth: 1940-07-19  [facility] CM/SW Contact:    Curlene Labrum, RN Phone Number: 07/26/2022, 1:02 PM  Clinical Narrative:                 CM met with the patient at the bedside after morning PT/OT session.  The patient states that she does not want to go to a nursing home for rehabilitation services.  The patient states that she was already active with [facility] for therapy for PT/OT.  Home health orders are in place.  I called and spoke with the daughter by phone and she states that the patient lives at an independent living facility and she plans to assist but not provide 24 hour care.  She is aware that the patient chooses to discharge home with [facility] therapy services at her apartment.  DME at the home includes [place], RW, Rolator, [facility] and elevated toilet seat.  The patient's daughter states that prior to surgery - she was only taking a few steps between the wheelchair and the toilet at baseline and is aware and agreeable to discharge to return home.  Discharge summary and home health orders will be faxed to [facility] for follow up therapy.  Discharge orders are in place and the daughter will be available this afternoon to transport the patient home by car.  07/26/2022 1646 - CM spoke with the daughter at the bedside and she is concerned over patient's mobility and returning to the ILF.  I provided her with private pay personal care services that she plans to contact to set up private duty services.  The daughter was asked to contact [facility] provider for possible coverage for phillips/life alert system.  Expected Discharge Plan: [place] Barriers to Discharge: No Barriers Identified   Patient Goals and CMS Choice Patient states their goals for this hospitalization and ongoing recovery are:: patient wants to get better and go  home CMS Medicare.gov Compare Post Acute Care list provided to:: Patient Choice offered to / list presented to : Patient, Adult Children      Expected Discharge Plan and Services   Discharge Planning Services: CM Consult Post Acute Care Choice: [place] arrangements for the past 2 months: [place] ([place] facility at [place] Expected Discharge Date: 07/26/22                         HH Arranged: PT, OT HH Agency:  ([facility]) Date [place]: 07/26/22 Time [place]: [place] Representative spoke with at [place]: Philippa Sicks, [place] with [place]  Prior Living Arrangements/Services Living arrangements for the past 2 months: [place] (Independent Living facility at [place].) Lives with:: Facility Resident Patient language and need for interpreter reviewed:: Yes        Need for Family Participation in Patient Care: Yes (Comment) Care giver support system in place?: Yes (comment) Current home services: DME (DME at home includes cane, RW, Rolator, WC and elevated toilet seat) Criminal Activity/Legal Involvement Pertinent to Current Situation/Hospitalization: No - Comment as needed  Activities of Daily Living Home Assistive Devices/Equipment: Wheelchair, Radio producer (specify quad or straight), Eyeglasses, Walker (specify type) ADL Screening (condition at time of admission) Patient's cognitive ability adequate to safely complete daily activities?: Yes Is the  patient deaf or have difficulty hearing?: Yes Does the patient have difficulty seeing, even when wearing glasses/contacts?: No Does the patient have difficulty concentrating, remembering, or making decisions?: No Patient able to express need for assistance with ADLs?: Yes Does the patient have difficulty dressing or bathing?: Yes Independently performs ADLs?: No Communication:  Independent Dressing (OT): Needs assistance Is this a change from baseline?: Pre-admission baseline Grooming: Independent Feeding: Independent Bathing: Needs assistance Is this a change from baseline?: Pre-admission baseline Toileting: Needs assistance Is this a change from baseline?: Pre-admission baseline In/Out Bed: Needs assistance Is this a change from baseline?: Pre-admission baseline Walks in Home: Needs assistance Is this a change from baseline?: Pre-admission baseline Does the patient have difficulty walking or climbing stairs?: Yes Weakness of Legs: None Weakness of Arms/Hands: None  Permission Sought/Granted Permission sought to share information with : Case Manager, Family Supports, Chartered certified accountant granted to share information with : Yes, Verbal Permission Granted     Permission granted to share info w AGENCY: Philippa Sicks at [facility] granted to share info w Relationship: daughter     Emotional Assessment Appearance:: Appears stated age Attitude/Demeanor/Rapport: Gracious Affect (typically observed): Accepting Orientation: : Oriented to Self, Oriented to Place, Oriented to  Time, Oriented to Situation Alcohol / Substance Use: Not Applicable Psych Involvement: No (comment)  Admission diagnosis:  Spondylolisthesis of lumbar region [M43.16] Patient Active Problem List   Diagnosis Date Noted   Spondylolisthesis of lumbar region 07/25/2022   Lumbar compression fracture ([place]) 04/06/2022   Epidural hematoma (HCC) 03/14/2022   Post-op pain 03/11/2022   Lumbar stenosis with neurogenic claudication 03/09/2022   CAP (community acquired pneumonia) 02/06/2021   CKD (chronic kidney disease) stage 4, GFR 15-29 ml/min (HCC) 02/06/2021   Generalized weakness 02/06/2021   Fracture dislocation of right ankle 02/06/2021   COVID-19 08/08/2019   Acute respiratory failure with hypoxemia ([place]) 08/08/2019   History of CVA  (cerebrovascular accident) 08/08/2019   Anemia 08/08/2019   CKD (chronic kidney disease), stage III ([place]) 08/08/2019   Dehydration    Vitamin B12 deficiency 02/10/2019   Unsteady gait 02/10/2019   Ischemic stroke ([place]) 12/06/2017   Diabetic polyneuropathy associated with type 2 diabetes mellitus ([place]) 12/06/2017   PCP:  [name], PA-C Pharmacy:   [facility] [id number] - [place], [place] [address] [place] [address] Phone: [phone] Fax: [phone]     Social Determinants of Health (SDOH) Social History: SDOH Screenings   Food Insecurity: No Food Insecurity (07/25/2022)  Housing: Low Risk  (07/25/2022)  Transportation Needs: No Transportation Needs (07/25/2022)  Utilities: Not At Risk (07/25/2022)  Tobacco Use: Medium Risk (07/25/2022)   SDOH Interventions:     Readmission Risk Interventions     No data to display

## 2022-07-26 NOTE — Evaluation (Signed)
Occupational Therapy Evaluation Patient Details Name: Teresa Daniels MRN: [id number] DOB: January 08, 1941 Today's Date: 07/26/2022   History of Present Illness Pt is 82 y.o. Female admitted 07/1622 for planned L4-5 open laminectomy, L4-5 posterolateral instrumented fusion due to RLE pain and LBP. Prior MIS lami and kypho in 9/23. PMH includes asthma, CKD IV, depression, DM II, HTN, and stroke.   Clinical Impression   Pt reports that she is typically mod I for ADL and mobility with DME. Ever since her last back surgery she has been sponge bathing in front of the sink - but would like to be able to get in the shower again. She has AE that assists in getting on compression socks, and wears slip on shoes. She uses a rollator, but has been at a WC level the past few weeks since a fall. Today she has VERY poor insight and awareness to her deficits and abilities. She can recall back precautions but has no evidence of implementing back precautions during ADL and requires multimodal cues. She was min guard to come to standing from recliner, but unable to come fully upright. Mod A for pivot transfers at this time. Max A for LB ADL and mod A for UB ADL. I am very concerned about her falling at ILF and injuring new back sx. She is adamant about not going to SNF - took convincing to participate in [facility]. She does have goals and therapies could help her achieve them. Her daughter is supposed to stay with her initially per Pt. OT will follow acutely and OT recommends SNF follow up- since Pt is declining, recommending HHOT.      Recommendations for follow up therapy are one component of a multi-disciplinary discharge planning process, led by the attending physician.  Recommendations may be updated based on patient status, additional functional criteria and insurance authorization.   Follow Up Recommendations  Skilled nursing-short term rehab (<3 hours/day) (Pt adamently declining - ok with [place])     Assistance Recommended  at Discharge Frequent or constant Supervision/Assistance  Patient can return home with the following A lot of help with walking and/or transfers;A lot of help with bathing/dressing/bathroom;Assist for transportation;Help with stairs or ramp for entrance    Functional Status Assessment  Patient has had a recent decline in their functional status and demonstrates the ability to make significant improvements in function in a reasonable and predictable amount of time.  Equipment Recommendations  None recommended by OT (Pt has appropriate DME)    Recommendations for Other Services PT consult     Precautions / Restrictions Precautions Precautions: Fall;Back Precaution Booklet Issued: Yes (comment) (back precautions) Precaution Comments: No brace required Restrictions Weight Bearing Restrictions: No      Mobility Bed Mobility               General bed mobility comments: in recliner after PT    Transfers Overall transfer level: Needs assistance Equipment used: 1 person hand held assist Transfers: Sit to/from Stand, Bed to chair/wheelchair/BSC Sit to Stand: Min guard, Mod assist   Squat pivot transfers: Mod assist       General transfer comment: Initial sit to stand from bed with mod A to rise and increased time.  Then was mod A to pivot to chair with cues for hand placement and increased time.  Once in chair , had pt stand again-from firmer surface and with armrest was able to stand (legs straight but trunk not fully upright) with min guard and demonstrated instability  Balance Overall balance assessment: Needs assistance Sitting-balance support: Bilateral upper extremity supported, No upper extremity supported Sitting balance-Leahy Scale: Fair Sitting balance - Comments: Preferred UE use; tendency to posterior lean; could sit briefly without support   Standing balance support: Bilateral upper extremity supported Standing balance-Leahy Scale: Poor Standing balance  comment: Requiring UE support and min guard to mod A                           ADL either performed or assessed with clinical judgement   ADL Overall ADL's : Needs assistance/impaired Eating/Feeding: Set up;Sitting   Grooming: Set up;Sitting Grooming Details (indicate cue type and reason): unable to maintain standing during grooming tasks, she has a chair that she uses in her bathroom Upper Body Bathing: Moderate assistance;Cueing for compensatory techniques Upper Body Bathing Details (indicate cue type and reason): tells me that she sponge bathes at baseline Lower Body Bathing: Moderate assistance;Cueing for compensatory techniques;Cueing for back precautions;Sitting/lateral leans Lower Body Bathing Details (indicate cue type and reason): Pt bends despite cues and relaying that "bending" is a back precautions. Upper Body Dressing : Moderate assistance   Lower Body Dressing: Maximal assistance Lower Body Dressing Details (indicate cue type and reason): does not wear socks and has slip on shoes. She does have AE for donning ted hose/compression socks Toilet Transfer: Moderate assistance;Squat-pivot (face to face transfer) Toilet Transfer Details (indicate cue type and reason): simulated Toileting- Clothing Manipulation and Hygiene: Maximal assistance;Sit to/from Nurse, children's Details (indicate cue type and reason): sponge bathes at sink, but wants to get back in the shower Functional mobility during ADLs: Moderate assistance (SPt only) General ADL Comments: decreased awareness in deficits and safety, can recite back precautions but does not maintain during functional tasks and transfers. Pt will tell me "my arms and legs are not strong like they used to be, and I would like to be able to take a real shower again - but ADAMENT that she will not go SNF.     Vision         Perception     Praxis      Pertinent Vitals/Pain Pain Assessment Pain Assessment:  0-10 Pain Score: 3  Pain Location: Bil legs all over (present for a couple weeks) Pain Descriptors / Indicators: Sharp Pain Intervention(s): Limited activity within patient's tolerance, Monitored during session, Repositioned     Hand Dominance Right   Extremity/Trunk Assessment Upper Extremity Assessment Upper Extremity Assessment: Generalized weakness   Lower Extremity Assessment Lower Extremity Assessment: Defer to PT evaluation RLE Deficits / Details: Reports weak since fall; ROM: WFL; MMT: hip 1/5, knee ext 1/5, ankle DF 4/5;Decreased sensation in feet (reports long hx, possible diabetic related?) LLE Deficits / Details: ROM WFL; MMT 4/5; Decreased sensation in feet (reports long hx, possible diabetic related?)   Cervical / Trunk Assessment Cervical / Trunk Assessment: Kyphotic;Back Surgery   Communication Communication Communication: No difficulties   Cognition Arousal/Alertness: Awake/alert Behavior During Therapy: WFL for tasks assessed/performed Overall Cognitive Status: Impaired/Different from baseline Area of Impairment: Safety/judgement, Problem solving, Awareness, Memory                     Memory: Decreased recall of precautions   Safety/Judgement: Decreased awareness of safety, Decreased awareness of deficits Awareness: Emergent Problem Solving: Requires verbal cues, Requires tactile cues General Comments: Pt with very poor insight into safety, deficits, and precautions.  Recalls precautions but needs  max cues to follow.  Pt also feels that she will be able to manage at home with little assist despite needing mod A for transfers today     General Comments  Noted pt diaphoretic -VSS , room was very warm but pt comfortable.    Exercises     Shoulder Instructions      Home Living Family/patient expects to be discharged to:: Other (Comment) Living Arrangements: Alone Available Help at Discharge: Family;Available PRN/intermittently (reports dtr may  stay first night) Type of Home: Independent living facility Home Access: Level entry;Elevator     Home Layout: One level     Bathroom Shower/Tub: Walk-in shower;Sponge bathes at baseline   Bathroom Toilet: Handicapped height     Home Equipment: Conservation officer, nature (2 wheels);Rollator (4 wheels);Cane - single point;Adaptive equipment;Shower seat;Grab bars - tub/shower;Wheelchair - Higher education careers adviser: Financial trader Comments: Lives at [facility]      Prior Functioning/Environment Prior Level of Function : Needs assist             Mobility Comments: Was using a rollator for ambulation (could do community ambulation) but fell a few weeks ago leading to back injury and using w/c because unable to walk but independent with w/c; pt is questionable historian (particularly in regards to timeline) ADLs Comments: Independent with adls; dtr assist with medicine management , iadls, groceries        OT Problem List: Decreased range of motion;Decreased activity tolerance;Impaired balance (sitting and/or standing);Decreased cognition;Decreased safety awareness;Decreased knowledge of use of DME or AE;Decreased knowledge of precautions;Obesity;Pain;Increased edema      OT Treatment/Interventions: Self-care/ADL training;DME and/or AE instruction;Therapeutic activities;Cognitive remediation/compensation;Patient/family education;Balance training    OT Goals(Current goals can be found in the care plan section) Acute Rehab OT Goals Patient Stated Goal: get home OT Goal Formulation: With patient Time For Goal Achievement: 08/09/22 Potential to Achieve Goals: Good ADL Goals Pt Will Perform Grooming: with modified independence;sitting Pt Will Perform Upper Body Bathing: with supervision;sitting Pt Will Perform Lower Body Bathing: with min guard assist;with adaptive equipment;sitting/lateral leans Pt Will Transfer to Toilet: with supervision;regular height toilet;ambulating Pt  Will Perform Toileting - Clothing Manipulation and hygiene: with min assist;sit to/from stand Additional ADL Goal #1: Pt will maintain back precautions during bed mobility and come EOB with min guard assist prior to engaging in ADL  OT Frequency: Min 2X/week    Co-evaluation              AM-PAC OT "6 Clicks" Daily Activity     Outcome Measure Help from another person eating meals?: None Help from another person taking care of personal grooming?: A Little Help from another person toileting, which includes using toliet, bedpan, or urinal?: A Lot Help from another person bathing (including washing, rinsing, drying)?: A Lot Help from another person to put on and taking off regular upper body clothing?: A Lot Help from another person to put on and taking off regular lower body clothing?: A Lot 6 Click Score: 15   End of Session Equipment Utilized During Treatment: Gait belt Nurse Communication: Mobility status;Precautions  Activity Tolerance: Patient tolerated treatment well Patient left: in chair;with call bell/phone within reach;with chair alarm set  OT Visit Diagnosis: Unsteadiness on feet (R26.81);Other abnormalities of gait and mobility (R26.89);Muscle weakness (generalized) (M62.81);History of falling (Z91.81);Other symptoms and signs involving cognitive function;Pain Pain - Right/Left: Right Pain - part of body: Leg                Time: [phone]  OT Time Calculation (min): 21 min Charges:  OT General Charges $OT Visit: 1 Visit OT Evaluation $OT Eval Moderate Complexity: [place] OTR/L Acute Rehabilitation Services Office: [place] 07/26/2022, 1:46 PM

## 2022-07-26 NOTE — Progress Notes (Signed)
Physical Therapy Evaluation Patient Details Name: Teresa Daniels MRN: [id number] DOB: 25-Mar-1941 Today's Date: 07/26/2022  History of Present Illness  Pt is 82 y.o. Female admitted 07/1622 for planned L4-5 open laminectomy, L4-5 posterolateral instrumented fusion due to RLE pain and LBP. Prior MIS lami and kypho in 9/23. PMH includes asthma, CKD IV, depression, DM II, HTN, and stroke.  Clinical Impression  Pt admitted with above diagnosis. At baseline, pt reports ambulatory with rollator but that she fell a few weeks ago leading to the need for this back sx.  States since fall she was unable to walk but could transfer to w/c on her own and mobilized by pushing w/c backward with feet.  Today, pt with poor safety awareness and poor insight into deficits.  She could recall precautions but needed max cues to follow.  Pt requiring mod A for bed mobility and mod A to stand pivot to chair.  Somewhat improved standing from chair (firm surface / armrest but still unstable).  Pt with generalized weakness but significant deficits in R LE (reports weak since fall).  Recommend SNF but pt resistant to SNF at d/c and needed encouragement to even consider HH.  If home would need 24 hr assist and max HH services.  Pt currently with functional limitations due to the deficits listed below (see PT Problem List). Pt will benefit from skilled PT to increase their independence and safety with mobility to allow discharge to the venue listed below.          Recommendations for follow up therapy are one component of a multi-disciplinary discharge planning process, led by the attending physician.  Recommendations may be updated based on patient status, additional functional criteria and insurance authorization.  Follow Up Recommendations Skilled nursing-short term rehab (<3 hours/day) (pt likely to refuse, will need max HH services and supervision) Can patient physically be transported by private vehicle: No    Assistance  Recommended at Discharge Frequent or constant Supervision/Assistance  Patient can return home with the following  A lot of help with walking and/or transfers;A lot of help with bathing/dressing/bathroom;Assistance with cooking/housework;Help with stairs or ramp for entrance    Equipment Recommendations None recommended by PT  Recommendations for Other Services       Functional Status Assessment Patient has had a recent decline in their functional status and demonstrates the ability to make significant improvements in function in a reasonable and predictable amount of time.     Precautions / Restrictions Precautions Precautions: Fall;Back Precaution Booklet Issued: Yes (comment) (back precautions) Precaution Comments: No brace required Restrictions Weight Bearing Restrictions: No      Mobility  Bed Mobility Overal bed mobility: Needs Assistance Bed Mobility: Sidelying to Sit, Rolling Rolling: Min assist Sidelying to sit: Mod assist       General bed mobility comments: Assist for R leg with rolling and then mod A for trunk to sit; MAX cues for log roll technique    Transfers Overall transfer level: Needs assistance Equipment used: 1 person hand held assist Transfers: Sit to/from Stand, Bed to chair/wheelchair/BSC Sit to Stand: Min guard, Mod assist     Squat pivot transfers: Mod assist     General transfer comment: Initial sit to stand from bed with mod A to rise and increased time.  Then was mod A to pivot to chair with cues for hand placement and increased time.  Once in chair , had pt stand again-from firmer surface and with armrest was able to stand (legs  straight but trunk not fully upright) with min guard and demonstrated instability    Ambulation/Gait               General Gait Details: unable  Stairs            Wheelchair Mobility    Modified Rankin (Stroke Patients Only)       Balance Overall balance assessment: Needs  assistance Sitting-balance support: Bilateral upper extremity supported, No upper extremity supported Sitting balance-Leahy Scale: Fair Sitting balance - Comments: Preferred UE use; tendency to posterior lean; could sit briefly without support   Standing balance support: Bilateral upper extremity supported Standing balance-Leahy Scale: Poor Standing balance comment: Requrining UE support and min guard to mod A                             Pertinent Vitals/Pain Pain Assessment Pain Assessment: 0-10 Pain Score: 3  Pain Location: Bil legs all over (present for a couple weeks) Pain Descriptors / Indicators: Sharp Pain Intervention(s): Limited activity within patient's tolerance, Monitored during session, Premedicated before session    Home Living Family/patient expects to be discharged to:: Other (Comment) Living Arrangements: Alone Available Help at Discharge: Family;Available PRN/intermittently (reports dtr may stay first night) Type of Home: Independent living facility Home Access: Level entry;Elevator       Home Layout: One level Home Equipment: Conservation officer, nature (2 wheels);Rollator (4 wheels);Cane - single point;Adaptive equipment;Shower seat;Grab bars - tub/shower;Wheelchair - manual Additional Comments: Lives at [facility]    Prior Function Prior Level of Function : Needs assist             Mobility Comments: Was using a rollator for ambulation (could do community ambulation) but fell a few weeks ago leading to back injury and using w/c because unable to walk but independent with w/c; pt is questionable historian (particularly in regards to timeline) ADLs Comments: Independent with adls; dtr assist with medicine management , iadls, groceries     Hand Dominance   Dominant Hand: Right    Extremity/Trunk Assessment   Upper Extremity Assessment Upper Extremity Assessment: Defer to OT evaluation    Lower Extremity Assessment Lower Extremity  Assessment: LLE deficits/detail;RLE deficits/detail RLE Deficits / Details: Reports weak since fall; ROM: WFL; MMT: hip 1/5, knee ext 1/5, ankle DF 4/5;Decreased sensation in feet (reports long hx, possible diabetic related?) LLE Deficits / Details: ROM WFL; MMT 4/5; Decreased sensation in feet (reports long hx, possible diabetic related?)    Cervical / Trunk Assessment Cervical / Trunk Assessment: Kyphotic  Communication   Communication: No difficulties  Cognition Arousal/Alertness: Awake/alert Behavior During Therapy: WFL for tasks assessed/performed Overall Cognitive Status: Impaired/Different from baseline Area of Impairment: Safety/judgement, Problem solving, Awareness, Memory                     Memory: Decreased recall of precautions   Safety/Judgement: Decreased awareness of safety, Decreased awareness of deficits   Problem Solving: Requires verbal cues, Requires tactile cues General Comments: Pt with very poor insight into safety, deficits, and precautions.  Recalls precautions but needs max cues to follow.  Pt also feels that she will be able to manage at home with little assist despite needing mod A for transfers today        General Comments General comments (skin integrity, edema, etc.): Noted pt diaphoretic -VSS , room was very warm but pt comfortable.    Exercises  Assessment/Plan    PT Assessment Patient needs continued PT services  PT Problem List Decreased strength;Pain;Decreased range of motion;Decreased cognition;Impaired sensation;Decreased activity tolerance;Decreased knowledge of use of DME;Decreased balance;Decreased safety awareness;Decreased mobility;Decreased knowledge of precautions       PT Treatment Interventions DME instruction;Therapeutic exercise;Gait training;Balance training;Neuromuscular re-education;Modalities;Functional mobility training;Therapeutic activities;Patient/family education;Cognitive remediation;Wheelchair mobility  training    PT Goals (Current goals can be found in the Care Plan section)  Acute Rehab PT Goals Patient Stated Goal: return home , declines SNF PT Goal Formulation: With patient Time For Goal Achievement: 08/09/22 Potential to Achieve Goals: Good    Frequency Min 5X/week     Co-evaluation               AM-PAC PT "6 Clicks" Mobility  Outcome Measure Help needed turning from your back to your side while in a flat bed without using bedrails?: A Lot Help needed moving from lying on your back to sitting on the side of a flat bed without using bedrails?: A Lot Help needed moving to and from a bed to a chair (including a wheelchair)?: A Lot Help needed standing up from a chair using your arms (e.g., wheelchair or bedside chair)?: A Lot Help needed to walk in hospital room?: Total Help needed climbing 3-5 steps with a railing? : Total 6 Click Score: 10    End of Session Equipment Utilized During Treatment: Gait belt Activity Tolerance: Patient tolerated treatment well Patient left: with chair alarm set;in chair;with call bell/phone within reach Nurse Communication: Mobility status PT Visit Diagnosis: Other abnormalities of gait and mobility (R26.89);Muscle weakness (generalized) (M62.81);Pain Pain - Right/Left: Right Pain - part of body: Leg    Time: 1128-1205 PT Time Calculation (min) (ACUTE ONLY): 37 min   Charges:   PT Evaluation $PT Eval Moderate Complexity: 1 Melina Schools, PT Acute Rehab [facility] [phone]   Karlton Lemon 07/26/2022, 1:04 PM

## 2022-07-26 NOTE — Progress Notes (Signed)
Spoke with patient and her daughter Kellie Shropshire. Kellie Shropshire states that the patient lives in an independent living facility, and she does not have the time or means to stay with the pt 24/7 to ensure safe transferring and care. Kellie Shropshire requests one more night of observation to ensure patient safety and pain control.

## 2022-07-26 NOTE — Discharge Summary (Signed)
Discharge Summary  Date of Admission: 07/25/2022  Date of Discharge: 07/26/22  Attending Physician: Emelda Brothers, MD  Hospital Course: Patient was admitted following an uncomplicated C7-6 laminectomy and PSF. They were recovered in PACU and transferred to 2W. Their preop symptoms were improved, their hospital course was uncomplicated and the patient was discharged home today with HH/PT/OT. They will follow up in clinic with me in clinic in 2 weeks. Restart Plavix 1 week post-op.  Neurologic exam at discharge:  Strength 5/5 x4 and SILTx4 incision c/d/i  Discharge diagnosis: Lumbar radiculopathy  Collene Schlichter, PA-C 07/26/22 9:32 AM

## 2022-07-26 NOTE — Progress Notes (Signed)
Neurosurgery Service Progress Note  Subjective: No acute events overnight. Reports improvement in pre-op LE radiculopathy. Back pain as expected. Pain currently well controlled. No voiced complaints at this time.    Objective: Vitals:   07/25/22 2342 07/26/22 0311 07/26/22 0739 07/26/22 0757  BP: (!) 114/40 (!) 141/61  (!) 110/54  Pulse: 82 79 80 81  Resp: 14  16 19   Temp: 98.6 F (37 C) 98.8 F (37.1 C)  99 F (37.2 C)  TempSrc: Oral Oral  Oral  SpO2: 95% 94%  95%  Weight:      Height:        Physical Exam: Strength 5/5 x4 and SILTx4 incision c/d/I   Assessment & Plan: 82 y.o. female s/p L4-5 laminectomy and PSF, recovering well.  -PT/OT recs - recommend HH/PT/OT  -Foley d/c'd this AM  -Restart Plavix on POD7 -Discharge home today   Norm Parcel, PA-C 07/26/22 9:30 AM

## 2022-07-26 NOTE — Care Management Obs Status (Cosign Needed)
[facility] NOTIFICATION   Patient Details  Name: Teresa Daniels MRN: [id number] Date of Birth: [birth year]   Medicare Observation Status Notification Given:  Yes    Curlene Labrum, RN 07/26/2022, 4:45 PM

## 2022-07-27 DIAGNOSIS — M4316 Spondylolisthesis, lumbar region: Secondary | ICD-10-CM | POA: Diagnosis not present

## 2022-07-27 LAB — CBC
HCT: 25.9 % — ABNORMAL LOW (ref 36.0–46.0)
Hemoglobin: 8.7 g/dL — ABNORMAL LOW (ref 12.0–15.0)
MCH: 33.6 pg (ref 26.0–34.0)
MCHC: 33.6 g/dL (ref 30.0–36.0)
MCV: 100 fL (ref 80.0–100.0)
Platelets: 181 10*3/uL (ref 150–400)
RBC: 2.59 MIL/uL — ABNORMAL LOW (ref 3.87–5.11)
RDW: 13.1 % (ref 11.5–15.5)
WBC: 7.2 10*3/uL (ref 4.0–10.5)
nRBC: 0 % (ref 0.0–0.2)

## 2022-07-27 LAB — CREATININE, SERUM
Creatinine, Ser: 1.64 mg/dL — ABNORMAL HIGH (ref 0.44–1.00)
GFR, Estimated: 31 mL/min — ABNORMAL LOW (ref >60)

## 2022-07-27 MED ORDER — HEPARIN SODIUM (PORCINE) 5000 UNIT/ML IJ SOLN
5000.0000 [IU] | Freq: Three times a day (TID) | INTRAMUSCULAR | Status: DC
  Administered 2022-07-27 – 2022-07-31 (×12): 5000 [IU] via SUBCUTANEOUS
  Filled 2022-07-27 (×12): qty 1

## 2022-07-27 NOTE — Progress Notes (Signed)
Physical Therapy Treatment Patient Details Name: Teresa Daniels MRN: [id number] DOB: [birth year] Today's Date: 07/27/2022   History of Present Illness Pt is 82 y.o. Female admitted 07/1622 for planned L4-5 open laminectomy, L4-5 posterolateral instrumented fusion due to RLE pain and LBP. Prior MIS lami and kypho in 9/23. PMH includes asthma, CKD IV, depression, DM II, HTN, and stroke.    PT Comments    Pt admitted with above diagnosis. Pt with significant weakness right LE and numbness reported bil LEs and this significantly limited pts ability to stand.  Pt with poor postural stability standing to RW needing max assist of 2 and mod assist of 2 to stand to sTedy. Messaged MD to make him aware of significant weakness right LE.  Will follow acutely and progress pt as able.  Pt has agreed to SNF and decr frequency to 4 x week.   Pt currently with functional limitations due to balance and endurance deficits. Pt will benefit from skilled PT to increase their independence and safety with mobility to allow discharge to the venue listed below.      Recommendations for follow up therapy are one component of a multi-disciplinary discharge planning process, led by the attending physician.  Recommendations may be updated based on patient status, additional functional criteria and insurance authorization.  Follow Up Recommendations  Skilled nursing-short term rehab (<3 hours/day) Can patient physically be transported by private vehicle: No   Assistance Recommended at Discharge Frequent or constant Supervision/Assistance  Patient can return home with the following A lot of help with walking and/or transfers;A lot of help with bathing/dressing/bathroom;Assistance with cooking/housework;Help with stairs or ramp for entrance   Equipment Recommendations  None recommended by PT    Recommendations for Other Services       Precautions / Restrictions Precautions Precautions: Fall;Back Precaution Booklet  Issued: Yes (comment) (back precautions) Precaution Comments: No brace required     Mobility  Bed Mobility               General bed mobility comments: Pt on EOB on arrival    Transfers Overall transfer level: Needs assistance Equipment used: Rolling walker (2 wheels) Transfers: Sit to/from Stand, Bed to chair/wheelchair/BSC Sit to Stand: Mod assist, Max assist, +2 physical assistance, From elevated surface           General transfer comment: Initial sit to stand from bed to RW with  max A to rise and increased time.  Pt with right knee buckling and wide BOS with flexed posture and pt could not get her balance.  Obtained Stedy and pt stood to [place] with mod assist of 2 and was moved to chair via Stedy needing cues for hand placement and increased time.  Pt significantly fatigued and did not want to stand again due to fatigue and some pain in back. Transfer via Lift Equipment: Stedy  Ambulation/Gait               General Gait Details: unable   Marine scientist Rankin (Stroke Patients Only)       Balance Overall balance assessment: Needs assistance Sitting-balance support: Bilateral upper extremity supported, No upper extremity supported Sitting balance-Leahy Scale: Poor Sitting balance - Comments: Preferred UE use; tendency to posterior lean; could sit briefly without support   Standing balance support: Bilateral upper extremity supported Standing balance-Leahy Scale: Poor Standing balance comment: Requiring UE support and mod to  max assist of 2 persons with A device Stedy vs. RW                            Cognition Arousal/Alertness: Awake/alert Behavior During Therapy: WFL for tasks assessed/performed Overall Cognitive Status: Impaired/Different from baseline Area of Impairment: Safety/judgement, Problem solving, Awareness, Memory                     Memory: Decreased recall of precautions    Safety/Judgement: Decreased awareness of safety, Decreased awareness of deficits Awareness: Emergent Problem Solving: Requires verbal cues, Requires tactile cues General Comments: Pt with very poor insight into safety, deficits, and precautions.  Recalls precautions but needs max cues to follow.        Exercises General Exercises - Lower Extremity Long Arc Quad: AROM, 10 reps, Seated, AAROM (AA right, AROM left)    General Comments General comments (skin integrity, edema, etc.): VSS      Pertinent Vitals/Pain Pain Assessment Pain Assessment: Faces Faces Pain Scale: Hurts even more Pain Location: Bil legs all over (present for a couple weeks) Pain Descriptors / Indicators: Sharp Pain Intervention(s): Limited activity within patient's tolerance, Monitored during session, Repositioned, Premedicated before session    Home Living                          Prior Function            PT Goals (current goals can now be found in the care plan section) Acute Rehab PT Goals Patient Stated Goal: return home after SNF Progress towards PT goals: Progressing toward goals    Frequency    Min 4X/week      PT Plan Discharge plan needs to be updated;Frequency needs to be updated    Co-evaluation              AM-PAC PT "6 Clicks" Mobility   Outcome Measure  Help needed turning from your back to your side while in a flat bed without using bedrails?: A Lot Help needed moving from lying on your back to sitting on the side of a flat bed without using bedrails?: A Lot Help needed moving to and from a bed to a chair (including a wheelchair)?: A Lot Help needed standing up from a chair using your arms (e.g., wheelchair or bedside chair)?: A Lot Help needed to walk in hospital room?: Total Help needed climbing 3-5 steps with a railing? : Total 6 Click Score: 10    End of Session Equipment Utilized During Treatment: Gait belt Activity Tolerance: Patient limited by  fatigue;Patient limited by pain Patient left: with chair alarm set;in chair;with call bell/phone within reach Nurse Communication: Mobility status;Need for lift equipment (used [place]) PT Visit Diagnosis: Other abnormalities of gait and mobility (R26.89);Muscle weakness (generalized) (M62.81);Pain Pain - Right/Left: Right Pain - part of body: Leg     Time: 5400-8676 PT Time Calculation (min) (ACUTE ONLY): 24 min  Charges:  $Therapeutic Activity: 23-37 mins                     Teresa Daniels M,PT Acute Rehab Services [phone]    Teresa Daniels 07/27/2022, 1:15 PM

## 2022-07-27 NOTE — Progress Notes (Addendum)
Neurosurgery Service Progress Note  Subjective: No acute events overnight.  Reports improvement in pre-op LE radiculopathy. Back pain as expected. Pain currently well controlled. She feels she is not able to take care of her self at home given her pain and limited mobility. No voiced complaints at this time.     Objective: Vitals:   07/26/22 2002 07/26/22 2017 07/27/22 0206 07/27/22 0815  BP: (!) 115/47  (!) 118/46 138/71  Pulse: 81  73 85  Resp: 15  15 16   Temp: 98.6 F (37 C)  98.7 F (37.1 C) 99.5 F (37.5 C)  TempSrc: Oral   Oral  SpO2: 94% 96% 93% 92%  Weight:      Height:        Physical Exam: Strength 5/5 x4 and SILTx4, incision c/d/I   Assessment & Plan: 82 y.o. female s/p L4-5 laminectomy and PSF, recovering well.   -Recommend SNF placement for discharge, appreciate SW assistance with d/c planning  -PT/OT -SQH 5000 mg TID ordered, SCDs/TEDs -Restart Plavix on POD 7   Norm Parcel, PA-C 07/27/22 11:25 AM

## 2022-07-27 NOTE — Progress Notes (Signed)
Placed SCD on per order. Patient is satisfied.

## 2022-07-28 DIAGNOSIS — M4316 Spondylolisthesis, lumbar region: Secondary | ICD-10-CM | POA: Diagnosis not present

## 2022-07-28 NOTE — Progress Notes (Addendum)
10:10am: CSW received bed offer from [name].  CSW spoke with patient's daughter Kellie Shropshire who is agreeable to accept bed offer from [name].  CSW initiated insurance authorization.  9:30am: CSW spoke with patient at bedside to confirm her interest in going to SNF for short term rehab - patient states she is in agreement to that plan. Patient reports she has been at [name] in the past and is agreeable to return there if a bed is available.  CSW sent patient's clinicals out for review.  Madilyn Fireman, MSW, LCSW Transitions of Care  Clinical Social Worker II [phone]

## 2022-07-28 NOTE — Progress Notes (Signed)
Occupational Therapy Treatment Patient Details Name: Teresa Daniels MRN: [id number] DOB: 07-15-1940 Today's Date: 07/28/2022   History of present illness Pt is 82 y.o. Female admitted 07/1622 for planned L4-5 open laminectomy, L4-5 posterolateral instrumented fusion due to RLE pain and LBP. Prior MIS lami and kypho in 9/23. PMH includes asthma, CKD IV, depression, DM II, HTN, and stroke.   OT comments  Focus session on maintaining spinal precautions during functional mobility. Pt continues with generalized weakness, decreased STM, problem solving, and safety during mobility. Pt performing bed mobility with up to max A. Performing STS transfers with mod A of 2 and use of stedy. Cues for adherence to precautions throughout. Washing face EOB with min guard A due to decreased sitting balance and frequent weight shifts with pain in RLE and buttock. Continue to recommend SNF for continued OT services.    Recommendations for follow up therapy are one component of a multi-disciplinary discharge planning process, led by the attending physician.  Recommendations may be updated based on patient status, additional functional criteria and insurance authorization.    Follow Up Recommendations  Skilled nursing-short term rehab (<3 hours/day)     Assistance Recommended at Discharge Frequent or constant Supervision/Assistance  Patient can return home with the following  A lot of help with walking and/or transfers;A lot of help with bathing/dressing/bathroom;Assist for transportation;Help with stairs or ramp for entrance   Equipment Recommendations  None recommended by OT (Pt has recommended DME)    Recommendations for Other Services PT consult    Precautions / Restrictions Precautions Precautions: Fall;Back Precaution Comments: No brace required; educated regarnding spinal precautions Restrictions Weight Bearing Restrictions: No       Mobility Bed Mobility Overal bed mobility: Needs  Assistance Bed Mobility: Rolling, Sidelying to Sit, Sit to Sidelying Rolling: Min assist Sidelying to sit: Max assist     Sit to sidelying: Mod assist General bed mobility comments: Requiring min assist to roll fully into sidelying and assist to bend RLE. Assist for BLE off EOB as well as light assist for trunk elevation    Transfers Overall transfer level: Needs assistance Equipment used: Rolling walker (2 wheels) Transfers: Sit to/from Stand Sit to Stand: Mod assist, +2 physical assistance           General transfer comment: Mod A +2 for power up; slow to rise. Cues for all aspects of mobility. Performing x2 Transfer via Lift Equipment: Stedy   Balance Overall balance assessment: Needs assistance Sitting-balance support: Bilateral upper extremity supported, No upper extremity supported Sitting balance-Leahy Scale: Poor Sitting balance - Comments: min guard   Standing balance support: Bilateral upper extremity supported Standing balance-Leahy Scale: Poor Standing balance comment: Requiring UE support and mod A with stedy                           ADL either performed or assessed with clinical judgement   ADL Overall ADL's : Needs assistance/impaired     Grooming: Wash/dry face;Min guard;Sitting Grooming Details (indicate cue type and reason): Min guard A due to unsafe weight shifts and shooting pain in RLE.buttock with pt leaning toward L nearly into sidelying             Lower Body Dressing: Total assistance Lower Body Dressing Details (indicate cue type and reason): to don socks Toilet Transfer: Moderate assistance;+2 for physical assistance (stedy) Toilet Transfer Details (indicate cue type and reason): simulated with stedy  Functional mobility during ADLs: Moderate assistance;+2 for physical assistance;+2 for safety/equipment (stedy) General ADL Comments: decreased awareness in deficits and safety, can recite back precautions but does not  maintain during functional tasks and transfers.    Extremity/Trunk Assessment Upper Extremity Assessment Upper Extremity Assessment: Generalized weakness   Lower Extremity Assessment Lower Extremity Assessment: Defer to PT evaluation        Vision       Perception     Praxis      Cognition Arousal/Alertness: Awake/alert Behavior During Therapy: WFL for tasks assessed/performed Overall Cognitive Status: Impaired/Different from baseline Area of Impairment: Safety/judgement, Problem solving, Awareness, Memory                     Memory: Decreased recall of precautions   Safety/Judgement: Decreased awareness of safety, Decreased awareness of deficits Awareness: Emergent Problem Solving: Requires verbal cues, Requires tactile cues General Comments: Pt with very poor insight into safety, deficits, and precautions.  Recalls precautions but needs max cues to follow. Observed with frequent weight shift L to R on EOB and occasional twisting. Cues to maintain upright psoition. Pt jumps and calls out in anticipation of pain (before being touched by therapist).        Exercises      Shoulder Instructions       General Comments VSS    Pertinent Vitals/ Pain       Pain Assessment Pain Assessment: Faces Faces Pain Scale: Hurts whole lot Pain Location: bil LE, during session/with mobility R>L leg pain Pain Descriptors / Indicators: Sharp Pain Intervention(s): Limited activity within patient's tolerance, Monitored during session  Home Living                                          Prior Functioning/Environment              Frequency  Min 2X/week        Progress Toward Goals  OT Goals(current goals can now be found in the care plan section)  Progress towards OT goals: Progressing toward goals  Acute Rehab OT Goals Patient Stated Goal: nap OT Goal Formulation: With patient Time For Goal Achievement: 08/09/22 Potential to Achieve  Goals: Good ADL Goals Pt Will Perform Grooming: with modified independence;sitting Pt Will Perform Upper Body Bathing: with supervision;sitting Pt Will Perform Lower Body Bathing: with min guard assist;with adaptive equipment;sitting/lateral leans Pt Will Transfer to Toilet: with supervision;regular height toilet;ambulating Pt Will Perform Toileting - Clothing Manipulation and hygiene: with min assist;sit to/from stand Additional ADL Goal #1: Pt will maintain back precautions during bed mobility and come EOB with min guard assist prior to engaging in ADL  Plan Frequency remains appropriate;Discharge plan remains appropriate    Co-evaluation                 AM-PAC OT "6 Clicks" Daily Activity     Outcome Measure   Help from another person eating meals?: None Help from another person taking care of personal grooming?: A Little Help from another person toileting, which includes using toliet, bedpan, or urinal?: A Lot Help from another person bathing (including washing, rinsing, drying)?: A Lot Help from another person to put on and taking off regular upper body clothing?: A Lot Help from another person to put on and taking off regular lower body clothing?: A Lot 6 Click Score: 15  End of Session Equipment Utilized During Treatment: Gait belt;Other (comment) (stedy)  OT Visit Diagnosis: Unsteadiness on feet (R26.81);Other abnormalities of gait and mobility (R26.89);Muscle weakness (generalized) (M62.81);History of falling (Z91.81);Other symptoms and signs involving cognitive function;Pain Pain - Right/Left: Right Pain - part of body: Leg   Activity Tolerance Patient tolerated treatment well   Patient Left in chair;with call bell/phone within reach;with bed alarm set   Nurse Communication Mobility status;Precautions;Patient requests pain meds;Other (comment) (needs purewick placemnt)        Time: 7741-2878 OT Time Calculation (min): 24 min  Charges: OT General  Charges $OT Visit: 1 Visit OT Treatments $Self Care/Home Management : 8-22 mins  Elder Cyphers, OTR/L [facility] Acute Rehabilitation Office: [phone]   Magnus Ivan 07/28/2022, 2:55 PM

## 2022-07-28 NOTE — Progress Notes (Signed)
Physical Therapy Treatment Patient Details Name: Teresa Daniels MRN: [id number] DOB: [birth year] Today's Date: 07/28/2022   History of Present Illness Pt is 82 y.o. Female admitted 07/1622 for planned L4-5 open laminectomy, L4-5 posterolateral instrumented fusion due to RLE pain and LBP. Prior MIS lami and kypho in 9/23. PMH includes asthma, CKD IV, depression, DM II, HTN, and stroke.    PT Comments    Patient not progressing well towards PT goals. Reports feeling tired this afternoon and reports pain in RLE/back. Requires Mod-Max A for bed mobility with cues for log roll technique and mod A of 2 for standing using stedy. Limited standing tolerance due to pain. Minimal quad activation of RLE. Pt with difficulty maintaining spinal precautions during functional mobility despite max cues. Continues to be appropriate for SNF. Will follow.   Recommendations for follow up therapy are one component of a multi-disciplinary discharge planning process, led by the attending physician.  Recommendations may be updated based on patient status, additional functional criteria and insurance authorization.  Follow Up Recommendations  Skilled nursing-short term rehab (<3 hours/day) Can patient physically be transported by private vehicle: No   Assistance Recommended at Discharge Frequent or constant Supervision/Assistance  Patient can return home with the following A lot of help with walking and/or transfers;A lot of help with bathing/dressing/bathroom;Assistance with cooking/housework;Help with stairs or ramp for entrance   Equipment Recommendations  None recommended by PT    Recommendations for Other Services       Precautions / Restrictions Precautions Precautions: Fall;Back Precaution Booklet Issued: No Precaution Comments: No brace required; educated regarding spinal precautions Restrictions Weight Bearing Restrictions: No     Mobility  Bed Mobility Overal bed mobility: Needs Assistance Bed  Mobility: Rolling, Sidelying to Sit, Sit to Sidelying Rolling: Min assist Sidelying to sit: Max assist     Sit to sidelying: Mod assist General bed mobility comments: Requiring min assist to roll fully into sidelying and assist to bend RLE. Assist for BLE off EOB as well as light assist for trunk elevation    Transfers Overall transfer level: Needs assistance Equipment used: Ambulation equipment used Transfers: Sit to/from Stand Sit to Stand: Mod assist, +2 physical assistance           General transfer comment: Mod A +2 for power up; slow to rise. Cues for upright posture. Limited standing tolerance. Stood from [employer], from [employer], declined further attempts. Transfer via Lift Equipment: Stedy  Ambulation/Gait               General Gait Details: unable due to RLE weakness.   Stairs             Wheelchair Mobility    Modified Rankin (Stroke Patients Only)       Balance Overall balance assessment: Needs assistance Sitting-balance support: Bilateral upper extremity supported, No upper extremity supported, Feet supported Sitting balance-Leahy Scale: Poor Sitting balance - Comments: min guard, right lateral lean Postural control: Right lateral lean Standing balance support: Bilateral upper extremity supported Standing balance-Leahy Scale: Poor Standing balance comment: Requiring UE support and mod A with stedy blocking right knee, limited standing tolerance.                            Cognition Arousal/Alertness: Awake/alert Behavior During Therapy: WFL for tasks assessed/performed Overall Cognitive Status: Impaired/Different from baseline Area of Impairment: Safety/judgement, Problem solving, Awareness, Memory  Memory: Decreased recall of precautions   Safety/Judgement: Decreased awareness of safety, Decreased awareness of deficits Awareness: Emergent Problem Solving: Requires verbal cues, Requires tactile  cues General Comments: Pt with very poor insight into safety, deficits, and precautions.  Recalls precautions but needs max cues to follow. Observed with frequent weight shift L to R on EOB and occasional twisting. Cues to maintain upright position. Pt jumps and calls out in anticipation of pain (before being touched by therapist).        Exercises General Exercises - Lower Extremity Ankle Circles/Pumps: AROM, Both, 10 reps, Supine Long Arc Quad: AAROM, Right, 10 reps, Seated Hip Flexion/Marching: AROM, Right, 5 reps, Seated    General Comments General comments (skin integrity, edema, etc.): VSS      Pertinent Vitals/Pain Pain Assessment Pain Assessment: Faces Faces Pain Scale: Hurts whole lot Pain Location: bil LE, during session/with mobility R>L leg pain Pain Descriptors / Indicators: Sharp, Grimacing, Guarding Pain Intervention(s): Monitored during session, Limited activity within patient's tolerance, Repositioned, Patient requesting pain meds-RN notified    Home Living                          Prior Function            PT Goals (current goals can now be found in the care plan section) Progress towards PT goals: Not progressing toward goals - comment (due to pain/fatigue)    Frequency    Min 4X/week      PT Plan Current plan remains appropriate    Co-evaluation PT/OT/SLP Co-Evaluation/Treatment: Yes Reason for Co-Treatment: For patient/therapist safety;To address functional/ADL transfers PT goals addressed during session: Mobility/safety with mobility;Balance;Strengthening/ROM        AM-PAC PT "6 Clicks" Mobility   Outcome Measure  Help needed turning from your back to your side while in a flat bed without using bedrails?: A Lot Help needed moving from lying on your back to sitting on the side of a flat bed without using bedrails?: A Lot Help needed moving to and from a bed to a chair (including a wheelchair)?: A Lot Help needed standing up from  a chair using your arms (e.g., wheelchair or bedside chair)?: A Lot Help needed to walk in hospital room?: Total Help needed climbing 3-5 steps with a railing? : Total 6 Click Score: 10    End of Session Equipment Utilized During Treatment: Gait belt Activity Tolerance: Patient limited by pain;Patient limited by fatigue Patient left: in bed;with call bell/phone within reach;with bed alarm set Nurse Communication: Mobility status;Need for lift equipment;Patient requests pain meds (stedy) PT Visit Diagnosis: Other abnormalities of gait and mobility (R26.89);Muscle weakness (generalized) (M62.81);Pain Pain - Right/Left: Right Pain - part of body: Leg     Time: 8177-1165 PT Time Calculation (min) (ACUTE ONLY): 23 min  Charges:  $Therapeutic Activity: 8-22 mins                     Marisa Severin, PT, DPT Acute Rehabilitation Services Secure chat preferred Office [phone]      Teresa Daniels 07/28/2022, 3:35 PM

## 2022-07-28 NOTE — NC FL2 (Signed)
[place] LEVEL OF CARE FORM     IDENTIFICATION  Patient Name: Teresa Daniels Birthdate: [birth year] Sex: female Admission Date (Current Location): 07/25/2022  [facility] and [place] Number:  Herbalist and Address:  [facility] 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   Lumbar compression fracture ([place]) 04/06/2022   Epidural hematoma (HCC) 03/14/2022   Post-op pain 03/11/2022   Lumbar stenosis with neurogenic claudication 03/09/2022   CAP (community acquired pneumonia) 02/06/2021   CKD (chronic kidney disease) stage 4, GFR 15-29 ml/min (HCC) 02/06/2021   Generalized weakness 02/06/2021   Fracture dislocation of right ankle 02/06/2021   COVID-19 08/08/2019   Acute respiratory failure with hypoxemia ([place]) 08/08/2019   History of CVA (cerebrovascular accident) 08/08/2019   Anemia 08/08/2019   CKD (chronic kidney disease), stage III ([place]) 08/08/2019   Dehydration    Vitamin B12 deficiency 02/10/2019   Unsteady gait 02/10/2019   Ischemic stroke ([place]) 12/06/2017   Diabetic polyneuropathy associated with type 2 diabetes mellitus ([place]) 12/06/2017    Orientation RESPIRATION BLADDER Height & Weight     Self, Time, Situation, Place  Normal Continent, External catheter Weight: 160 lb 0.9 oz (72.6 kg) Height:  5\' 5"  (165.1 cm)  BEHAVIORAL SYMPTOMS/MOOD NEUROLOGICAL BOWEL NUTRITION STATUS      Continent Diet  AMBULATORY STATUS COMMUNICATION OF NEEDS Skin   Limited Assist Verbally Normal                        Personal Care Assistance Level of Assistance  Bathing, Feeding, Dressing Bathing Assistance: Limited assistance Feeding assistance: Limited assistance Dressing Assistance: Limited assistance     Functional Limitations Info  Sight, Hearing, Speech Sight Info: Adequate Hearing Info: Adequate Speech Info: Adequate    SPECIAL CARE FACTORS FREQUENCY  PT (By licensed PT), OT (By licensed OT)     PT Frequency: 5x weekly OT Frequency: 5x weekly            Contractures Contractures Info: Not present    Additional Factors Info  Code Status, Allergies Code Status Info: Full Code Allergies Info: Cat Hair Extract           Current Medications (07/28/2022):  This is the current hospital active medication list Current Facility-Administered Medications  Medication Dose Route Frequency Provider Last Rate Last Admin   0.9 %  sodium chloride infusion  250 mL Intravenous Continuous Judith Part, MD 1 mL/hr at 07/25/22 1832 Infusion Verify at 07/25/22 1832   acetaminophen (TYLENOL) tablet 650 mg  650 mg Oral Q4H PRN Judith Part, MD   650 mg at 07/26/22 2313   Or   acetaminophen (TYLENOL) suppository 650 mg  650 mg Rectal Q4H PRN Judith Part, MD       oxyCODONE (Oxy IR/ROXICODONE) immediate release tablet 2.5 mg  2.5 mg Oral QHS Judith Part, MD   2.5 mg at 07/27/22 2243   And   acetaminophen (TYLENOL) tablet 325 mg  325 mg Oral QHS Judith Part, MD   325 mg at 07/27/22 2242   acetaminophen (TYLENOL) tablet 500 mg  500 mg Oral QHS PRN Judith Part, MD       And   diphenhydrAMINE (BENADRYL) capsule 25 mg  25 mg Oral QHS PRN Judith Part, MD       atorvastatin (LIPITOR) tablet 40 mg  40 mg Oral QHS Judith Part, MD   40 mg at 07/27/22 2242   budesonide (PULMICORT) nebulizer solution 0.25 mg  0.25 mg Nebulization BID Judith Part, MD   0.25 mg at 07/28/22 0739   cyclobenzaprine (FLEXERIL) tablet 10 mg   10 mg Oral TID PRN Judith Part, MD   10 mg at 07/26/22 1404   docusate sodium (COLACE) capsule 100 mg  100 mg Oral BID Judith Part, MD   100 mg at 07/27/22 2243   gabapentin (NEURONTIN) capsule 300 mg  300 mg Oral QPC supper Judith Part, MD   300 mg at 07/27/22 1736   glipiZIDE (GLUCOTROL XL) 24 hr tablet 2.5 mg  2.5 mg Oral q AM Judith Part, MD   2.5 mg at 07/27/22 0921   heparin injection 5,000 Units  5,000 Units Subcutaneous Q8H Collene Schlichter, PA-C   5,000 Units at 07/28/22 3235   hydrochlorothiazide (HYDRODIURIL) tablet 25 mg  25 mg Oral Daily Judith Part, MD   25 mg at 07/27/22 5732   HYDROmorphone (DILAUDID) injection 1 mg  1 mg Intravenous Q3H PRN Judith Part, MD       loratadine (CLARITIN) tablet 10 mg  10 mg Oral Daily Judith Part, MD   10 mg at 07/27/22 2025   losartan (COZAAR) tablet 50 mg  50 mg Oral Daily Judith Part, MD   50 mg at 07/27/22 4270   menthol-cetylpyridinium (CEPACOL) lozenge 3 mg  1 lozenge Oral PRN Judith Part, MD       Or   phenol (CHLORASEPTIC) mouth spray 1 spray  1 spray Mouth/Throat PRN Judith Part, MD       montelukast (SINGULAIR) tablet 10 mg  10 mg Oral QPC supper Judith Part, MD   10 mg at 07/27/22 1736   ondansetron (ZOFRAN) tablet 4 mg  4 mg Oral Q6H PRN Judith Part, MD       Or   ondansetron (ZOFRAN) injection 4 mg  4 mg Intravenous Q6H PRN Judith Part, MD       oxyCODONE (Oxy IR/ROXICODONE) immediate release tablet 5 mg  5 mg Oral Q4H PRN Judith Part, MD   5 mg at 07/28/22 0622   pantoprazole (PROTONIX) EC tablet 40 mg  40 mg Oral QAC breakfast Judith Part, MD   40 mg at 07/28/22 0616   polyethylene glycol (MIRALAX / GLYCOLAX) packet 17 g  17 g Oral Daily PRN Judith Part, MD       sertraline (ZOLOFT) tablet 100 mg  100 mg Oral QPC supper Judith Part, MD   100 mg at 07/27/22 1736   sodium chloride flush (NS) 0.9 %  injection 3 mL  3 mL Intravenous Q12H Judith Part, MD   3 mL at 07/26/22 1026   sodium chloride flush (NS) 0.9 % injection 3 mL  3 mL Intravenous PRN Judith Part, MD         Discharge Medications: Please see discharge summary for a list of discharge medications.  Relevant Imaging Results:  Relevant Lab Results:   Additional Information SSN: [id number]  Archie Endo, LCSW

## 2022-07-28 NOTE — Plan of Care (Signed)
  Problem: Education: Goal: Knowledge of General Education information will improve Description: Including pain rating scale, medication(s)/side effects and non-pharmacologic comfort measures Outcome: Progressing   Problem: Clinical Measurements: Goal: Will remain free from infection Outcome: Progressing   Problem: Clinical Measurements: Goal: Respiratory complications will improve Outcome: Progressing   Problem: Activity: Goal: Risk for activity intolerance will decrease Outcome: Progressing   Problem: Pain Managment: Goal: General experience of comfort will improve Outcome: Progressing   Problem: Safety: Goal: Ability to remain free from injury will improve Outcome: Progressing   Problem: Skin Integrity: Goal: Risk for impaired skin integrity will decrease Outcome: Progressing

## 2022-07-28 NOTE — Progress Notes (Signed)
Neurosurgery Service Progress Note  Subjective: No acute events overnight, back pain and some proximal leg weakness, especially on the right, but similar to preop overall given she was non-ambulatory preop   Objective: Vitals:   07/27/22 1526 07/27/22 2044 07/27/22 2203 07/28/22 0831  BP: (!) 117/48  (!) 130/48 (!) 118/48  Pulse: 82 80 83 82  Resp: 16 17 18 16   Temp: 99.1 F (37.3 C)  99.9 F (37.7 C) 99 F (37.2 C)  TempSrc: Oral  Oral Oral  SpO2: 93% 94% 93% 93%  Weight:      Height:        Physical Exam: Strength 5/5 x4 except right knee extension is 4-/5 able to provide some resistance but certainly weak, incision c/d/i  Assessment & Plan: 82 y.o. woman s/p open L4-5 lami / PLF, recovering well.  -SNF discharge pending -PT/OT -SCDs/TEDs, SQH  Judith Part  07/28/22 9:27 AM

## 2022-07-28 NOTE — Progress Notes (Signed)
Initial Nutrition Assessment  DOCUMENTATION CODES:   Not applicable  INTERVENTION:  - Add Carnation Breakfast Essentials TID, each packet mixed with 8 ounces of 2% milk provides 13 grams of protein and 260 calories.  NUTRITION DIAGNOSIS:   Inadequate oral intake related to poor appetite as evidenced by meal completion < 25%.  GOAL:   Patient will meet greater than or equal to 90% of their needs  MONITOR:   PO intake, Supplement acceptance  REASON FOR ASSESSMENT:   Malnutrition Screening Tool    ASSESSMENT:   82 y.o. female admits related to RLE pain. PMH includes: CKD, DM, GERD, HTN, stroke. Pt is currently receiving medical management related to spondylolisthesis of lumbar region.  Meds reviewed:  lipitor, colace, glipizide, cozaar. Labs reviewed (No BMP since admit).   Pt states that she has not been eating well since admission. Per record, pt has not been meeting her needs. Pt states that she does not want any supplements like Boost or Ensure. Pt agreed to try [employer]. RD will add supplements and continue to monitor PO intakes.   NUTRITION - FOCUSED PHYSICAL EXAM:  WNL - no wasting noted.   Diet Order:   Diet Order             Diet - low sodium heart healthy           Diet regular Room service appropriate? Yes; Fluid consistency: Thin  Diet effective now                   EDUCATION NEEDS:   Not appropriate for education at this time  Skin:  Skin Assessment: Skin Integrity Issues: Skin Integrity Issues:: Incisions Incisions: back  Last BM:  07/24/22  Height:   Ht Readings from Last 1 Encounters:  07/25/22 5\' 5"  (1.651 m)    Weight:   Wt Readings from Last 1 Encounters:  07/25/22 72.6 kg    Ideal Body Weight:     BMI:  Body mass index is 26.63 kg/m.  Estimated Nutritional Needs:   Kcal:  2902-1115 kcals  Protein:  90-110 gm  Fluid:  >/= 1.8 L  Thalia Bloodgood, RD, LDN, CNSC.

## 2022-07-29 DIAGNOSIS — M4316 Spondylolisthesis, lumbar region: Secondary | ICD-10-CM | POA: Diagnosis not present

## 2022-07-29 NOTE — Progress Notes (Signed)
Patient's insurance authorization has been approved - #[id number]. The next review date is 08/01/2022.  Madilyn Fireman, MSW, LCSW Transitions of Care  Clinical Social Worker II [phone]

## 2022-07-29 NOTE — Progress Notes (Signed)
   Providing Compassionate, Quality Care - Together  NEUROSURGERY PROGRESS NOTE   S: No issues overnight.   O: EXAM:  BP 126/70 (BP Location: Left Arm)   Pulse 89   Temp 99.2 F (37.3 C) (Oral)   Resp 19   Ht 5\' 5"  (1.651 m)   Wt 72.6 kg   SpO2 98%   BMI 26.63 kg/m   Awake, alert, oriented  PERRL Speech fluent, appropriate  CNs grossly intact  5/5 BUE/BLE, except for right knee extension/hip flexion 4 -/5 Wound clean dry and intact  ASSESSMENT:  82 y.o. 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  Please do not hesitate to call with questions or concerns.   Elwin Sleight, [facility]

## 2022-07-29 NOTE — Plan of Care (Signed)
  Problem: Education: Goal: Knowledge of General Education information will improve Description: Including pain rating scale, medication(s)/side effects and non-pharmacologic comfort measures Outcome: Progressing   Problem: Clinical Measurements: Goal: Cardiovascular complication will be avoided Outcome: Progressing   Problem: Activity: Goal: Risk for activity intolerance will decrease Outcome: Progressing   Problem: Pain Managment: Goal: General experience of comfort will improve Outcome: Progressing   Problem: Safety: Goal: Ability to remain free from injury will improve Outcome: Progressing   Problem: Skin Integrity: Goal: Risk for impaired skin integrity will decrease Outcome: Progressing

## 2022-07-30 DIAGNOSIS — M4316 Spondylolisthesis, lumbar region: Secondary | ICD-10-CM | POA: Diagnosis not present

## 2022-07-30 NOTE — Progress Notes (Signed)
  NEUROSURGERY PROGRESS NOTE   No issues overnight. Pt reports improved right leg paresthesia today.  EXAM:  BP (!) 159/80 (BP Location: Left Arm)   Pulse 92   Temp 99.1 F (37.3 C) (Oral)   Resp 19   Ht 5\' 5"  (1.651 m)   Wt 72.6 kg   SpO2 92%   BMI 26.63 kg/m   Awake, alert, oriented  Speech fluent, appropriate  CN grossly intact  5/5 BUE/BLE x mild right hip flexion weakness  IMPRESSION:  82 y.o. female POD# 5 s/p L4-5 decompression/fusion, recovering slowly   PLAN: - Cont supportive care, plan on SNF placement tomorrow   Teresa Lose, MD [facility]

## 2022-07-30 NOTE — Progress Notes (Signed)
Auth received for SNF placement at [facility]. Spoke to [place] at [facility] who reports they will not have bed availability until tomorrow. SW will provide updates as available.   Wandra Feinstein, MSW, LCSW [phone] (coverage)

## 2022-07-30 NOTE — Plan of Care (Signed)

## 2022-07-31 DIAGNOSIS — M4316 Spondylolisthesis, lumbar region: Secondary | ICD-10-CM | POA: Diagnosis not present

## 2022-07-31 MED ORDER — OXYCODONE HCL 5 MG PO TABS: 5.0000 mg | ORAL_TABLET | ORAL | 0 refills | Status: DC | PRN

## 2022-07-31 NOTE — Progress Notes (Addendum)
CSW spoke with Narda Rutherford at [facility] who states patient can be accepted into the facility today. Narda Rutherford spoke with patient's daughter who will go complete paperwork at 1pm today.  Patient will go to [name] via [facility]. The number to call for report is [phone] - RN to call report and transport at 2pm. Patient will go to room 3239.  Madilyn Fireman, MSW, LCSW Transitions of Care  Clinical Social Worker II [phone]

## 2022-07-31 NOTE — Discharge Summary (Signed)
Discharge Summary  Date of Admission: 07/25/2022  Date of Discharge: 07/31/22  Attending Physician: Emelda Brothers, MD  Hospital Course: Patient was admitted following an uncomplicated open M4-6 lami / PLF. She was recovered in PACU and transferred to 2W. Her hospital course was uncomplicated and the patient was discharged to SNF on 07/31/22. She will follow up in clinic with me in 1-2 weeks in the office.  Neurologic exam at discharge:  AOx3, PERRL, EOMI, FS, TM Strength 5/5 x4 expect for mild right hip flexion weakness, SILTx4, incision c/d/I   Discharge Diagnosis: Lumbar radiculopathy  Diet: Resume regular diet  Discharge Medications: Allergies as of 07/31/2022       Reactions   Cat Hair Extract Other (See Comments)   Runny nose        Medication List     STOP taking these medications    oxycodone-acetaminophen 2.5-325 MG tablet Commonly known as: PERCOCET       TAKE these medications    acetaminophen 500 MG tablet Commonly known as: TYLENOL Take 1,000-1,500 mg by mouth every 8 (eight) hours as needed for moderate pain.   albuterol 108 (90 Base) MCG/ACT inhaler Commonly known as: VENTOLIN HFA Inhale 2 puffs into the lungs every 6 (six) hours as needed for wheezing or shortness of breath.   atorvastatin 40 MG tablet Commonly known as: LIPITOR Take 40 mg by mouth at bedtime.   B-12 PO Take 1 tablet by mouth 3 (three) times a week.   beclomethasone 40 MCG/ACT inhaler Commonly known as: QVAR Inhale 1 puff into the lungs 2 (two) times daily as needed (shortness of breath).   cetirizine 10 MG tablet Commonly known as: ZYRTEC Take 10 mg by mouth daily.   clopidogrel 75 MG tablet Commonly known as: PLAVIX Take 1 tablet (75 mg total) by mouth in the morning. Restart on 04/07/22 Start taking on: August 01, 2022   cyclobenzaprine 10 MG tablet Commonly known as: FLEXERIL Take 1 tablet (10 mg total) by mouth 3 (three) times daily as needed for muscle  spasms.   diphenhydramine-acetaminophen 25-500 MG Tabs tablet Commonly known as: TYLENOL PM Take 2 tablets by mouth at bedtime as needed (sleep).   gabapentin 300 MG capsule Commonly known as: NEURONTIN Take 300 mg by mouth daily after supper.   glipiZIDE 2.5 MG 24 hr tablet Commonly known as: GLUCOTROL XL Take 2.5 mg by mouth in the morning.   hydrochlorothiazide 25 MG tablet Commonly known as: HYDRODIURIL Take 25 mg by mouth daily.   ICY HOT EX Apply 1 Application topically daily as needed (pain).   IRON PO Take 1 capsule by mouth daily.   KRILL & FISH OIL BLEND PO Take 1 capsule by mouth daily after breakfast.   losartan 50 MG tablet Commonly known as: COZAAR Take 50 mg by mouth daily.   MAGNESIUM PO Take 1 tablet by mouth daily.   montelukast 10 MG tablet Commonly known as: SINGULAIR Take 10 mg by mouth daily after supper.   oxyCODONE 5 MG immediate release tablet Commonly known as: Oxy IR/ROXICODONE Take 1 tablet (5 mg total) by mouth every 4 (four) hours as needed for severe pain.   pantoprazole 40 MG tablet Commonly known as: PROTONIX Take 40 mg by mouth daily before breakfast.   sertraline 100 MG tablet Commonly known as: ZOLOFT Take 100 mg by mouth daily after supper.   VITAMIN D PO Take 1 tablet by mouth daily.   Voltaren 1 % Gel Generic drug: diclofenac Sodium  Apply 2 g topically daily as needed (pain).        Collene Schlichter, PA-C 07/31/22 11:19 AM

## 2022-07-31 NOTE — Progress Notes (Signed)
Neurosurgery Service Progress Note  Subjective: No acute events overnight. Pt reports improved right leg paresthesia today.   Objective: Vitals:   07/30/22 2142 07/31/22 0535 07/31/22 0853 07/31/22 0959  BP:  (!) 143/68  (!) 120/49  Pulse:  85 66 78  Resp:  17 18 16   Temp:  97.8 F (36.6 C)  98 F (36.7 C)  TempSrc:  Oral  Oral  SpO2: 94% 96% 93% 100%  Weight:      Height:        Physical Exam: Strength 5/5 x4 expect for mild right hip flexion weakness, SILTx4, incision c/d/I   Assessment & Plan: 82 y.o. woman s/p open L4-5 lami / PLF, recovering well.  -Discharge to SNF today  Norm Parcel, PA-C 07/31/22 11:18 AM

## 2022-09-07 ENCOUNTER — Emergency Department (HOSPITAL_COMMUNITY): Payer: Medicare PPO

## 2022-09-07 ENCOUNTER — Inpatient Hospital Stay (HOSPITAL_COMMUNITY)
Admission: EM | Admit: 2022-09-07 | Discharge: 2022-09-15 | DRG: 493 | Disposition: A | Discharge status: Skilled Nursing Facility | Payer: Medicare PPO | Attending: Internal Medicine | Admitting: Internal Medicine

## 2022-09-07 ENCOUNTER — Other Ambulatory Visit: Payer: Self-pay

## 2022-09-07 ENCOUNTER — Encounter (HOSPITAL_COMMUNITY): Payer: Self-pay

## 2022-09-07 DIAGNOSIS — I69393 Ataxia following cerebral infarction: Secondary | ICD-10-CM

## 2022-09-07 DIAGNOSIS — S82899A Other fracture of unspecified lower leg, initial encounter for closed fracture: Secondary | ICD-10-CM

## 2022-09-07 DIAGNOSIS — M1990 Unspecified osteoarthritis, unspecified site: Secondary | ICD-10-CM | POA: Diagnosis present

## 2022-09-07 DIAGNOSIS — J45909 Unspecified asthma, uncomplicated: Secondary | ICD-10-CM | POA: Diagnosis present

## 2022-09-07 DIAGNOSIS — D649 Anemia, unspecified: Secondary | ICD-10-CM | POA: Diagnosis present

## 2022-09-07 DIAGNOSIS — Z7902 Long term (current) use of antithrombotics/antiplatelets: Secondary | ICD-10-CM

## 2022-09-07 DIAGNOSIS — E11628 Type 2 diabetes mellitus with other skin complications: Secondary | ICD-10-CM

## 2022-09-07 DIAGNOSIS — S82892A Other fracture of left lower leg, initial encounter for closed fracture: Secondary | ICD-10-CM

## 2022-09-07 DIAGNOSIS — E785 Hyperlipidemia, unspecified: Secondary | ICD-10-CM | POA: Diagnosis present

## 2022-09-07 DIAGNOSIS — M7989 Other specified soft tissue disorders: Secondary | ICD-10-CM

## 2022-09-07 DIAGNOSIS — L538 Other specified erythematous conditions: Secondary | ICD-10-CM

## 2022-09-07 DIAGNOSIS — Z9109 Other allergy status, other than to drugs and biological substances: Secondary | ICD-10-CM

## 2022-09-07 DIAGNOSIS — I447 Left bundle-branch block, unspecified: Secondary | ICD-10-CM | POA: Diagnosis present

## 2022-09-07 DIAGNOSIS — D631 Anemia in chronic kidney disease: Secondary | ICD-10-CM | POA: Diagnosis present

## 2022-09-07 DIAGNOSIS — Z7984 Long term (current) use of oral hypoglycemic drugs: Secondary | ICD-10-CM

## 2022-09-07 DIAGNOSIS — D62 Acute posthemorrhagic anemia: Secondary | ICD-10-CM | POA: Diagnosis not present

## 2022-09-07 DIAGNOSIS — W19XXXD Unspecified fall, subsequent encounter: Secondary | ICD-10-CM | POA: Diagnosis present

## 2022-09-07 DIAGNOSIS — M898X9 Other specified disorders of bone, unspecified site: Secondary | ICD-10-CM | POA: Diagnosis present

## 2022-09-07 DIAGNOSIS — K219 Gastro-esophageal reflux disease without esophagitis: Secondary | ICD-10-CM | POA: Diagnosis present

## 2022-09-07 DIAGNOSIS — S82842A Displaced bimalleolar fracture of left lower leg, initial encounter for closed fracture: Secondary | ICD-10-CM | POA: Diagnosis not present

## 2022-09-07 DIAGNOSIS — N183 Chronic kidney disease, stage 3 unspecified: Secondary | ICD-10-CM | POA: Diagnosis not present

## 2022-09-07 DIAGNOSIS — S93432A Sprain of tibiofibular ligament of left ankle, initial encounter: Secondary | ICD-10-CM | POA: Diagnosis present

## 2022-09-07 DIAGNOSIS — Z87891 Personal history of nicotine dependence: Secondary | ICD-10-CM

## 2022-09-07 DIAGNOSIS — E876 Hypokalemia: Secondary | ICD-10-CM | POA: Diagnosis present

## 2022-09-07 DIAGNOSIS — L03115 Cellulitis of right lower limb: Secondary | ICD-10-CM | POA: Diagnosis present

## 2022-09-07 DIAGNOSIS — S92912D Unspecified fracture of left toe(s), subsequent encounter for fracture with routine healing: Secondary | ICD-10-CM

## 2022-09-07 DIAGNOSIS — W19XXXA Unspecified fall, initial encounter: Principal | ICD-10-CM

## 2022-09-07 DIAGNOSIS — M80072A Age-related osteoporosis with current pathological fracture, left ankle and foot, initial encounter for fracture: Secondary | ICD-10-CM | POA: Diagnosis not present

## 2022-09-07 DIAGNOSIS — E860 Dehydration: Secondary | ICD-10-CM | POA: Diagnosis present

## 2022-09-07 DIAGNOSIS — F32A Depression, unspecified: Secondary | ICD-10-CM | POA: Diagnosis present

## 2022-09-07 DIAGNOSIS — E1122 Type 2 diabetes mellitus with diabetic chronic kidney disease: Secondary | ICD-10-CM | POA: Diagnosis present

## 2022-09-07 DIAGNOSIS — I5042 Chronic combined systolic (congestive) and diastolic (congestive) heart failure: Secondary | ICD-10-CM | POA: Diagnosis present

## 2022-09-07 DIAGNOSIS — I130 Hypertensive heart and chronic kidney disease with heart failure and stage 1 through stage 4 chronic kidney disease, or unspecified chronic kidney disease: Secondary | ICD-10-CM | POA: Diagnosis present

## 2022-09-07 DIAGNOSIS — N1831 Chronic kidney disease, stage 3a: Secondary | ICD-10-CM | POA: Diagnosis present

## 2022-09-07 DIAGNOSIS — Z79899 Other long term (current) drug therapy: Secondary | ICD-10-CM

## 2022-09-07 DIAGNOSIS — L03119 Cellulitis of unspecified part of limb: Secondary | ICD-10-CM

## 2022-09-07 DIAGNOSIS — Z993 Dependence on wheelchair: Secondary | ICD-10-CM

## 2022-09-07 DIAGNOSIS — E119 Type 2 diabetes mellitus without complications: Secondary | ICD-10-CM

## 2022-09-07 DIAGNOSIS — W010XXA Fall on same level from slipping, tripping and stumbling without subsequent striking against object, initial encounter: Secondary | ICD-10-CM | POA: Diagnosis present

## 2022-09-07 DIAGNOSIS — Z974 Presence of external hearing-aid: Secondary | ICD-10-CM

## 2022-09-07 DIAGNOSIS — E1142 Type 2 diabetes mellitus with diabetic polyneuropathy: Secondary | ICD-10-CM | POA: Diagnosis present

## 2022-09-07 DIAGNOSIS — F419 Anxiety disorder, unspecified: Secondary | ICD-10-CM | POA: Diagnosis present

## 2022-09-07 DIAGNOSIS — M48062 Spinal stenosis, lumbar region with neurogenic claudication: Secondary | ICD-10-CM

## 2022-09-07 DIAGNOSIS — R296 Repeated falls: Secondary | ICD-10-CM | POA: Diagnosis present

## 2022-09-07 DIAGNOSIS — Z8673 Personal history of transient ischemic attack (TIA), and cerebral infarction without residual deficits: Secondary | ICD-10-CM

## 2022-09-07 HISTORY — DX: Type 2 diabetes mellitus with diabetic polyneuropathy: E11.42

## 2022-09-07 LAB — CBC WITH DIFFERENTIAL/PLATELET
Abs Immature Granulocytes: 0.02 10*3/uL (ref 0.00–0.07)
Basophils Absolute: 0 10*3/uL (ref 0.0–0.1)
Basophils Relative: 1 %
Eosinophils Absolute: 0.3 10*3/uL (ref 0.0–0.5)
Eosinophils Relative: 4 %
HCT: 29.9 % — ABNORMAL LOW (ref 36.0–46.0)
Hemoglobin: 9.8 g/dL — ABNORMAL LOW (ref 12.0–15.0)
Immature Granulocytes: 0 %
Lymphocytes Relative: 15 %
Lymphs Abs: 1 10*3/uL (ref 0.7–4.0)
MCH: 33.1 pg (ref 26.0–34.0)
MCHC: 32.8 g/dL (ref 30.0–36.0)
MCV: 101 fL — ABNORMAL HIGH (ref 80.0–100.0)
Monocytes Absolute: 0.5 10*3/uL (ref 0.1–1.0)
Monocytes Relative: 7 %
Neutro Abs: 4.9 10*3/uL (ref 1.7–7.7)
Neutrophils Relative %: 73 %
Platelets: 224 10*3/uL (ref 150–400)
RBC: 2.96 MIL/uL — ABNORMAL LOW (ref 3.87–5.11)
RDW: 13 % (ref 11.5–15.5)
WBC: 6.8 10*3/uL (ref 4.0–10.5)
nRBC: 0 % (ref 0.0–0.2)

## 2022-09-07 LAB — RETICULOCYTES
Immature Retic Fract: 17.5 % — ABNORMAL HIGH (ref 2.3–15.9)
RBC.: 3.18 MIL/uL — ABNORMAL LOW (ref 3.87–5.11)
Retic Count, Absolute: 70 10*3/uL (ref 19.0–186.0)
Retic Ct Pct: 2.2 % (ref 0.4–3.1)

## 2022-09-07 LAB — GLUCOSE, CAPILLARY: Glucose-Capillary: 169 mg/dL — ABNORMAL HIGH (ref 70–99)

## 2022-09-07 LAB — PHOSPHORUS: Phosphorus: 3 mg/dL (ref 2.5–4.6)

## 2022-09-07 LAB — BASIC METABOLIC PANEL
Anion gap: 11 (ref 5–15)
BUN: 20 mg/dL (ref 8–23)
CO2: 27 mmol/L (ref 22–32)
Calcium: 9 mg/dL (ref 8.9–10.3)
Chloride: 101 mmol/L (ref 98–111)
Creatinine, Ser: 1.28 mg/dL — ABNORMAL HIGH (ref 0.44–1.00)
GFR, Estimated: 42 mL/min — ABNORMAL LOW (ref >60)
Glucose, Bld: 133 mg/dL — ABNORMAL HIGH (ref 70–99)
Potassium: 3.2 mmol/L — ABNORMAL LOW (ref 3.5–5.1)
Sodium: 139 mmol/L (ref 135–145)

## 2022-09-07 LAB — VITAMIN B12: Vitamin B-12: 1312 pg/mL — ABNORMAL HIGH (ref 180–914)

## 2022-09-07 LAB — IRON AND TIBC
Iron: 51 ug/dL (ref 28–170)
Saturation Ratios: 25 % (ref 10.4–31.8)
TIBC: 204 ug/dL — ABNORMAL LOW (ref 250–450)
UIBC: 153 ug/dL

## 2022-09-07 LAB — MAGNESIUM: Magnesium: 1.9 mg/dL (ref 1.7–2.4)

## 2022-09-07 LAB — OSMOLALITY: Osmolality: 300 mOsm/kg — ABNORMAL HIGH (ref 275–295)

## 2022-09-07 LAB — FERRITIN: Ferritin: 394 ng/mL — ABNORMAL HIGH (ref 11–307)

## 2022-09-07 LAB — FOLATE: Folate: 10.9 ng/mL (ref >5.9)

## 2022-09-07 LAB — CK: Total CK: 137 U/L (ref 38–234)

## 2022-09-07 LAB — TSH: TSH: 1.962 u[IU]/mL (ref 0.350–4.500)

## 2022-09-07 MED ORDER — MONTELUKAST SODIUM 10 MG PO TABS
10.0000 mg | ORAL_TABLET | Freq: Every day | ORAL | Status: DC
  Administered 2022-09-07 – 2022-09-15 (×9): 10 mg via ORAL
  Filled 2022-09-07 (×9): qty 1

## 2022-09-07 MED ORDER — GABAPENTIN 300 MG PO CAPS
300.0000 mg | ORAL_CAPSULE | Freq: Every day | ORAL | Status: DC
  Administered 2022-09-07 – 2022-09-15 (×9): 300 mg via ORAL
  Filled 2022-09-07 (×9): qty 1

## 2022-09-07 MED ORDER — OXYCODONE HCL 5 MG PO TABS
5.0000 mg | ORAL_TABLET | ORAL | Status: DC | PRN
  Administered 2022-09-09 – 2022-09-15 (×10): 5 mg via ORAL
  Filled 2022-09-07 (×10): qty 1

## 2022-09-07 MED ORDER — PANTOPRAZOLE SODIUM 40 MG PO TBEC
40.0000 mg | DELAYED_RELEASE_TABLET | Freq: Every day | ORAL | Status: DC
  Administered 2022-09-08 – 2022-09-15 (×8): 40 mg via ORAL
  Filled 2022-09-07 (×8): qty 1

## 2022-09-07 MED ORDER — FLUTICASONE PROPIONATE HFA 44 MCG/ACT IN AERO
2.0000 | INHALATION_SPRAY | Freq: Two times a day (BID) | RESPIRATORY_TRACT | Status: DC
  Filled 2022-09-07: qty 10.6

## 2022-09-07 MED ORDER — CLOPIDOGREL BISULFATE 75 MG PO TABS: 75.0000 mg | ORAL_TABLET | Freq: Every morning | ORAL | Status: DC

## 2022-09-07 MED ORDER — INSULIN ASPART 100 UNIT/ML IJ SOLN
0.0000 [IU] | INTRAMUSCULAR | Status: DC
  Administered 2022-09-08: 2 [IU] via SUBCUTANEOUS
  Administered 2022-09-08 (×2): 1 [IU] via SUBCUTANEOUS
  Administered 2022-09-08 – 2022-09-10 (×10): 2 [IU] via SUBCUTANEOUS
  Administered 2022-09-11 (×2): 1 [IU] via SUBCUTANEOUS

## 2022-09-07 MED ORDER — POTASSIUM CHLORIDE 10 MEQ/100ML IV SOLN
10.0000 meq | INTRAVENOUS | Status: AC
  Administered 2022-09-07 – 2022-09-08 (×2): 10 meq via INTRAVENOUS
  Filled 2022-09-07 (×2): qty 100

## 2022-09-07 MED ORDER — METHOCARBAMOL 500 MG PO TABS
500.0000 mg | ORAL_TABLET | Freq: Four times a day (QID) | ORAL | Status: DC | PRN
  Administered 2022-09-14 (×2): 500 mg via ORAL
  Filled 2022-09-07 (×2): qty 1

## 2022-09-07 MED ORDER — ETOMIDATE 2 MG/ML IV SOLN
0.1500 mg/kg | Freq: Once | INTRAVENOUS | Status: AC
  Administered 2022-09-07: 10.8 mg via INTRAVENOUS
  Filled 2022-09-07: qty 10

## 2022-09-07 MED ORDER — METHOCARBAMOL 1000 MG/10ML IJ SOLN: 500.0000 mg | Freq: Four times a day (QID) | INTRAVENOUS | Status: DC | PRN

## 2022-09-07 MED ORDER — SERTRALINE HCL 100 MG PO TABS
100.0000 mg | ORAL_TABLET | Freq: Every day | ORAL | Status: DC
  Administered 2022-09-08 – 2022-09-15 (×9): 100 mg via ORAL
  Filled 2022-09-07 (×9): qty 1

## 2022-09-07 MED ORDER — BUDESONIDE 0.25 MG/2ML IN SUSP
0.2500 mg | Freq: Two times a day (BID) | RESPIRATORY_TRACT | Status: DC
  Administered 2022-09-08 – 2022-09-14 (×6): 0.25 mg via RESPIRATORY_TRACT
  Filled 2022-09-07 (×17): qty 2

## 2022-09-07 MED ORDER — MORPHINE SULFATE (PF) 2 MG/ML IV SOLN: 0.5000 mg | INTRAVENOUS | Status: DC | PRN

## 2022-09-07 MED ORDER — ATORVASTATIN CALCIUM 40 MG PO TABS
40.0000 mg | ORAL_TABLET | Freq: Every day | ORAL | Status: DC
  Administered 2022-09-08 – 2022-09-14 (×7): 40 mg via ORAL
  Filled 2022-09-07 (×7): qty 1

## 2022-09-07 MED ORDER — FENTANYL CITRATE PF 50 MCG/ML IJ SOSY
50.0000 ug | PREFILLED_SYRINGE | Freq: Once | INTRAMUSCULAR | Status: AC
  Administered 2022-09-07: 50 ug via INTRAVENOUS
  Filled 2022-09-07: qty 1

## 2022-09-07 MED ORDER — HYDROCODONE-ACETAMINOPHEN 5-325 MG PO TABS
1.0000 | ORAL_TABLET | Freq: Four times a day (QID) | ORAL | Status: DC | PRN
  Administered 2022-09-07 – 2022-09-08 (×3): 1 via ORAL
  Administered 2022-09-09 – 2022-09-10 (×3): 2 via ORAL
  Filled 2022-09-07: qty 2
  Filled 2022-09-07: qty 1
  Filled 2022-09-07: qty 2
  Filled 2022-09-07: qty 1
  Filled 2022-09-07: qty 2
  Filled 2022-09-07: qty 1

## 2022-09-07 MED ORDER — SODIUM CHLORIDE 0.9 % IV SOLN
1.0000 g | INTRAVENOUS | Status: DC
  Administered 2022-09-08: 1 g via INTRAVENOUS
  Filled 2022-09-07: qty 10

## 2022-09-07 MED ORDER — CLOPIDOGREL BISULFATE 75 MG PO TABS
75.0000 mg | ORAL_TABLET | Freq: Every morning | ORAL | Status: DC
  Administered 2022-09-08 – 2022-09-12 (×5): 75 mg via ORAL
  Filled 2022-09-07 (×5): qty 1

## 2022-09-07 MED ORDER — BECLOMETHASONE DIPROPIONATE 40 MCG/ACT IN AERS: 1.0000 | INHALATION_SPRAY | Freq: Two times a day (BID) | RESPIRATORY_TRACT | Status: DC | PRN

## 2022-09-07 NOTE — ED Notes (Signed)
Contacted Ortho tech; per ortho tech, splint applied to LLE.

## 2022-09-07 NOTE — Progress Notes (Signed)
Lower extremity venous right study completed.  Preliminary results relayed to provider in the ED.   See CV Proc for preliminary results report.   Darlin Coco, RDMS, RVT

## 2022-09-07 NOTE — Assessment & Plan Note (Signed)
Chronic stable continue home medications albuterol as needed Pulmicort is twice daily and Singulair 10 mg a day

## 2022-09-07 NOTE — H&P (Signed)
Teresa Daniels [id number] DOB: 10-31-1940 DOA: 09/07/2022     PCP: [name], PA-C   Outpatient Specialists:    [name] Patient arrived to ER on 09/07/22 at 1104 Referred by Attending [name], MD   Patient coming from:   [facility]    Chief Complaint:   Chief Complaint  Patient presents with   Fall    HPI: Teresa Daniels is a 82 y.o. female with medical history significant of Dm2,  CKD, anemia, HLD,  Spondylolisthesis, lumbar region sp luminectomy    Presented with   fall  Patient had a laminectomy procedure on 16 January with neurosurgery [name] she was discharged to rehab [name] and on Sunday was discharged to independent living while she still was on wheelchair transferring to bed.  Patient had multiple falls first resulting in fracture of her left foot toes.  She had another fall today with severe right ankle pain no fevers or chills no chest pain or shortness of breath she did hit her head but did not lose consciousness.  On presentation it was noted to be swelling and a deformity and tenderness of the left ankle Pt was transferring from her wheelchair to arm chair    Does not smoke or drink  No URI symptoms   Regarding pertinent Chronic problems:     Hyperlipidemia -  on statins Lipitor (atorvastatin)  Lipid Panel     Component Value Date/Time   TRIG 194 (H) 08/07/2019 2330     HTN on Cozaar HCTZ   chronic CHF  systolic/ combined - last echo  is 50 to 55%. Left Grade I diastolic dysfunction    %.       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  Lab Results   Component Value Date   CREATININE 1.28 (H) 09/07/2022   CREATININE 1.64 (H) 07/27/2022   CREATININE 1.59 (H) 03/11/2022    Chronic anemia - baseline hg Hemoglobin & Hematocrit  Recent Labs    07/05/22 0848 07/27/22 1150 09/07/22 1718  HGB 10.1* 8.7* 9.8*     While in ER: Clinical Course as of 09/07/22 1924  Thu Sep 07, 2022  1542 Patient's x-ray did show a bimalleolar fracture with dislocation of the ankle.  Orthopedics was consulted that assisted in reduction and splinting and they recommended outpatient follow-up in 1 week.  Please see my procedure note for sedation for the procedure.  Patient in no other traumatic injuries on exam.  Patient's daughter requested ultrasound of right lower extremity with her redness and swelling to rule out DVT due to her recent fractures and hospitalizations.  The patient will be unable to transfer with her right toe fractures and left ankle fracture and will require social work eval for SNF. [VK]  1551 Patient signed out to [name] pending DVT study with plan for likely admission for PT eval and pain control. [VK]    Clinical Course User Index [VK] [name], DO      Ordered Left  ankle Comminuted displaced fractures are seen in distal tibia and fibula. There is lateral dislocation in the left ankle. Improved alignment of bimalleolar fracture postreduction.  CT HEAD/Neck   NON acute Left knee Minimal degenerative changes. No evidence for acute abnormality   Pelvis No fracture or dislocation is seen in pelvis.  CXR -  NON acute    Following Medications were ordered in ER: Medications  insulin aspart (novoLOG) injection 0-9 Units (has no administration in time range)  potassium chloride 10 mEq in 100 mL IVPB (has no administration in time range)  etomidate (AMIDATE) injection 10.8 mg (10.8 mg Intravenous Given 09/07/22 1520)  fentaNYL (SUBLIMAZE) injection 50 mcg (50 mcg Intravenous Given 09/07/22 1523)     _______________________________________________________ ER Provider Called:     Orthopedics [name] They Recommend admit to medicine    SEEN in ER   ED Triage Vitals  Enc Vitals Group     BP 09/07/22 1112 131/61     Pulse Rate 09/07/22 1112 74     Resp 09/07/22 1112 18     Temp 09/07/22 1112 (!) 97.5 F (36.4 C)     Temp Source 09/07/22 1112 Oral     SpO2 09/07/22 1112 96 %     Weight 09/07/22 1114 158 lb 11.7 oz (72 kg)     Height 09/07/22 1114 '5\' 5"'$  (1.651 m)     Head Circumference --      Peak Flow --      Pain Score 09/07/22 1114 0     Pain Loc --      Pain Edu? --      Excl. in [place]? --   TMAX(24)@     _________________________________________ Significant initial  Findings: Abnormal Labs Reviewed  BASIC METABOLIC PANEL - Abnormal; Notable for the following components:      Result Value   Potassium 3.2 (*)    Glucose, Bld 133 (*)    Creatinine, Ser 1.28 (*)    GFR, Estimated 42 (*)    All other components within normal limits  CBC WITH DIFFERENTIAL/PLATELET - Abnormal; Notable for the following components:   RBC 2.96 (*)    Hemoglobin 9.8 (*)    HCT 29.9 (*)    MCV 101.0 (*)    All other components within normal limits    ECG: Ordered Personally reviewed and interpreted by me showing: HR : 75 Rhythm: Sinus rhythm Left bundle branch block No significant change since last tracing QTC 459    The recent clinical data is shown below. Vitals:   09/07/22 1700 09/07/22 1719 09/07/22 1730 09/07/22 1800  BP: 122/85 (!) 156/70 (!) 141/60 (!) 145/67  Pulse: 81 71 78 81  Resp: '17 18 16 19  '$ Temp:      TempSrc:      SpO2: 100% 97% 95% 95%  Weight:      Height:        WBC     Component Value Date/Time   WBC 6.8 09/07/2022 1718   LYMPHSABS 1.0 09/07/2022 1718   MONOABS 0.5 09/07/2022 1718   EOSABS 0.3 09/07/2022 1718   BASOSABS 0.0 09/07/2022 1718       UA ordered     Results for orders placed or performed during the hospital encounter of 07/20/22   Surgical pcr screen     Status: None   Collection Time: 07/20/22  8:08 AM   Specimen: Nasal Mucosa; Nasal Swab  Result Value Ref Range Status   MRSA, PCR NEGATIVE NEGATIVE  Final   Staphylococcus aureus NEGATIVE NEGATIVE Final    Comment: (NOTE) The Xpert SA Assay (FDA approved for NASAL specimens in patients 8 years of age and older), is one component of a comprehensive surveillance program. It is not intended to diagnose infection nor to guide or monitor treatment. Performed at [facility], [place] [address] [place], [place] [address]      _______________________________________________ Hospitalist was called for admission for   Fall, initial encounter  Closed bimalleolar fracture of left ankle, initial encounter     The following Work up has been ordered so far:  Orders Placed This Encounter  Procedures   Sedation procedure   DG Chest Port 1 View   DG Ankle Complete Left   DG Pelvis Portable   CT Head Wo Contrast   CT Cervical Spine Wo Contrast   DG Ankle 2 Views Left   DG Knee Left Port   Basic metabolic panel   CBC with Differential   Magnesium   Hemoglobin A1c   CK   Osmolality   Osmolality, urine   Creatinine, urine, random   TSH   Sodium, urine, random   Prealbumin   Urinalysis, Complete w Microscopic -Urine, Clean Catch   Phosphorus   Vitamin B12   Folate   Iron and TIBC   Ferritin   Reticulocytes   Ice to affected area   Procedural sedation   Non weight bearing   Apply short leg splint   Maintain short leg splint   Cardiac Monitoring Continuous x 24 hours Indications for use: Other; Other indications for use: hypokalemia   Apply Diabetes Mellitus Care Plan   STAT CBG when hypoglycemia is suspected. If treated, recheck every 15 minutes after each treatment until CBG >/= 70 mg/dl   Refer to Hypoglycemia Protocol Sidebar Report for treatment of CBG < 70 mg/dl   No basal insulin at this time   Full code   Consult to orthopedic surgery    Consult for [place] Admission   Consult to Transition of Care Team   Consult to Transition of Care Team   Consult to Registered Dietitian   EKG 12-Lead   Admit to Inpatient (patient's expected length of stay will be greater than 2 midnights or inpatient only procedure)   VAS [place] LOWER EXTREMITY VENOUS (DVT) (7a-7p)     OTHER Significant initial  Findings:  labs showing:  Recent Labs  Lab 09/07/22 1718 09/07/22 1726  NA 139  --   K 3.2*  --   CO2 27  --   GLUCOSE 133*  --   BUN 20  --   CREATININE 1.28*  --   CALCIUM 9.0  --   MG  --  1.9    Cr   stable,    Lab Results  Component Value Date   CREATININE 1.28 (H) 09/07/2022   CREATININE 1.64 (H) 07/27/2022   CREATININE 1.59 (H) 03/11/2022    No results for input(s): "AST", "ALT", "ALKPHOS", "BILITOT", "PROT", "ALBUMIN" in the last 168 hours. Lab Results  Component Value Date   CALCIUM 9.0 09/07/2022   PHOS 3.3 02/08/2021        Plt: Lab Results  Component Value Date   PLT 224 09/07/2022     COVID-19 Labs  No results for input(s): "DDIMER", "FERRITIN", "LDH", "CRP" in the last 72 hours.  Lab Results  Component Value Date   SARSCOV2NAA NEGATIVE 02/15/2021   [place] NEGATIVE 02/06/2021   [place] NEGATIVE 06/07/2020  SARSCOV2NAA POSITIVE (A) 08/09/2019       Recent Labs  Lab 09/07/22 1718  WBC 6.8  NEUTROABS 4.9  HGB 9.8*  HCT 29.9*  MCV 101.0*  PLT 224    HG/HCT  stable     Component Value Date/Time   HGB 9.8 (L) 09/07/2022 1718   HCT 29.9 (L) 09/07/2022 1718   MCV 101.0 (H) 09/07/2022 1718        DM  labs:  HbA1C: Recent Labs    01/25/22 1033  HGBA1C 7.2*       CBG (last 3)  No results for input(s): "GLUCAP" in the last 72 hours.        Cultures:    Component Value Date/Time   SDES BLOOD LEFT HAND 02/06/2021 2115   SDES BLOOD RIGHT HAND 02/06/2021 2115   SPECREQUEST  02/06/2021 2115    BOTTLES DRAWN AEROBIC AND ANAEROBIC Blood Culture results may not be  optimal due to an inadequate volume of blood received in culture bottles   SPECREQUEST  02/06/2021 2115    BOTTLES DRAWN AEROBIC AND ANAEROBIC Blood Culture results may not be optimal due to an inadequate volume of blood received in culture bottles   CULT  02/06/2021 2115    NO GROWTH 5 DAYS Performed at [facility], [place] [address], [place], [place] [address]    CULT  02/06/2021 2115    NO GROWTH 5 DAYS Performed at [place] [address] [place], [place] [address]    REPTSTATUS 02/11/2021 FINAL 02/06/2021 2115   REPTSTATUS 02/11/2021 FINAL 02/06/2021 2115     Radiological Exams on Admission: VAS [place] LOWER EXTREMITY VENOUS (DVT) (7a-7p)  Result Date: 09/07/2022  Lower Venous DVT Study Patient Name:  TAYLIANA WINNER  Date of Exam:   09/07/2022 Medical Rec #: [id number]       Accession #:    [id number] Date of Birth: 08-04-1940      Patient Gender: F Patient Age:   17 years Exam Location:  [facility] Procedure:      VAS [place] LOWER EXTREMITY VENOUS (DVT) Referring Phys: Leanord Asal --------------------------------------------------------------------------------  Indications: Right calf erythema, swelling.  Comparison Study: No prior studies. Performing Technologist: Darlin Coco RDMS, RVT  Examination Guidelines: A complete evaluation includes B-mode imaging, spectral Doppler, color Doppler, and power Doppler as needed of all accessible portions of each vessel. Bilateral testing is considered an integral part of a complete examination. Limited examinations for reoccurring indications may be performed as noted. The reflux portion of the exam is performed with the patient in reverse Trendelenburg.  +---------+---------------+---------+-----------+----------+--------------+ RIGHT    CompressibilityPhasicitySpontaneityPropertiesThrombus Aging +---------+---------------+---------+-----------+----------+--------------+ CFV      Full           Yes      Yes                                  +---------+---------------+---------+-----------+----------+--------------+ SFJ      Full                                                        +---------+---------------+---------+-----------+----------+--------------+ FV Prox  Full                                                        +---------+---------------+---------+-----------+----------+--------------+  FV Mid   Full                                                        +---------+---------------+---------+-----------+----------+--------------+ FV DistalFull                                                        +---------+---------------+---------+-----------+----------+--------------+ PFV      Full                                                        +---------+---------------+---------+-----------+----------+--------------+ POP      Full           Yes      Yes                                 +---------+---------------+---------+-----------+----------+--------------+ PTV      Full                                                        +---------+---------------+---------+-----------+----------+--------------+ PERO     Full                                                        +---------+---------------+---------+-----------+----------+--------------+   +----+---------------+---------+-----------+----------+--------------+ LEFTCompressibilityPhasicitySpontaneityPropertiesThrombus Aging +----+---------------+---------+-----------+----------+--------------+ CFV Full           Yes      Yes                                 +----+---------------+---------+-----------+----------+--------------+     Summary: RIGHT: - There is no evidence of deep vein thrombosis in the lower extremity.  - No cystic structure found in the popliteal fossa.  LEFT: - No evidence of common femoral vein obstruction.  *See table(s) above for measurements and observations.    Preliminary     DG Ankle 2 Views Left  Result Date: 09/07/2022 CLINICAL DATA:  Ankle fracture, postreduction. EXAM: LEFT ANKLE - 2 VIEW COMPARISON:  Preoperative radiograph earlier today. FINDINGS: Improved alignment of bimalleolar fracture postreduction. Overlying splint material limits osseous and soft tissue fine detail. The ankle mortise is currently congruent. No obvious posterior tibial tubercle fracture is seen. IMPRESSION: Improved alignment of bimalleolar fracture postreduction. Electronically Signed   By: Keith Rake M.D.   On: 09/07/2022 15:54   DG Knee Left Port  Result Date: 09/07/2022 CLINICAL DATA:  Ankle fracture.  Fall. EXAM: PORTABLE LEFT KNEE - 1-2 VIEW COMPARISON:  None Available. FINDINGS: There is minimal joint space narrowing of the MEDIAL compartment. No acute fracture or subluxation.  No significant joint effusion. No radiopaque foreign body. IMPRESSION: Minimal degenerative changes. No evidence for acute abnormality. Electronically Signed   By: Nolon Nations M.D.   On: 09/07/2022 15:52   CT Head Wo Contrast  Result Date: 09/07/2022 CLINICAL DATA:  Fall EXAM: CT HEAD WITHOUT CONTRAST CT CERVICAL SPINE WITHOUT CONTRAST TECHNIQUE: Multidetector CT imaging of the head and cervical spine was performed following the standard protocol without intravenous contrast. Multiplanar CT image reconstructions of the cervical spine were also generated. RADIATION DOSE REDUCTION: This exam was performed according to the departmental dose-optimization program which includes automated exposure control, adjustment of the mA and/or kV according to patient size and/or use of iterative reconstruction technique. COMPARISON:  CT head and cervical spine 03/08/2018 FINDINGS: CT HEAD FINDINGS Brain: There is no acute intracranial hemorrhage, extra-axial fluid collection, or acute infarct Parenchymal volume is normal for age. The ventricles are normal in size. Gray-white differentiation is preserved. Patchy  hypodensity in the supratentorial white matter likely reflects sequela of chronic small-vessel ischemic change. The pituitary and suprasellar region are normal. There is no mass lesion. There is no mass effect or midline shift. Vascular: There is calcification of the bilateral carotid siphons. Skull: Normal. Negative for fracture or focal lesion. Sinuses/Orbits: The paranasal sinuses are clear. Bilateral lens implants are in place. The globes and orbits are otherwise unremarkable. Other: None. CT CERVICAL SPINE FINDINGS Alignment: Normal. There is no jumped or perched facet or other evidence of traumatic malalignment Skull base and vertebrae: Skull base alignment is maintained. Vertebral body heights are preserved. There is no evidence of acute fracture. There is no suspicious osseous lesion. Soft tissues and spinal canal: No prevertebral fluid or swelling. No visible canal hematoma. Disc levels: There is disc space narrowing and degenerative endplate change most advanced at C5-C6 with prominent anterior osteophytes throughout the remainder of the cervical spine. There is multilevel facet arthropathy most advanced on the left at C3-C4 through C5-C6. There is no evidence of high-grade spinal canal stenosis. Upper chest: There is right worse than left apical scarring. Other: None. IMPRESSION: 1. No acute intracranial pathology. 2. No acute fracture or traumatic malalignment of the cervical spine. Electronically Signed   By: Valetta Mole M.D.   On: 09/07/2022 12:47   CT Cervical Spine Wo Contrast  Result Date: 09/07/2022 CLINICAL DATA:  Fall EXAM: CT HEAD WITHOUT CONTRAST CT CERVICAL SPINE WITHOUT CONTRAST TECHNIQUE: Multidetector CT imaging of the head and cervical spine was performed following the standard protocol without intravenous contrast. Multiplanar CT image reconstructions of the cervical spine were also generated. RADIATION DOSE REDUCTION: This exam was performed according to the departmental  dose-optimization program which includes automated exposure control, adjustment of the mA and/or kV according to patient size and/or use of iterative reconstruction technique. COMPARISON:  CT head and cervical spine 03/08/2018 FINDINGS: CT HEAD FINDINGS Brain: There is no acute intracranial hemorrhage, extra-axial fluid collection, or acute infarct Parenchymal volume is normal for age. The ventricles are normal in size. Gray-white differentiation is preserved. Patchy hypodensity in the supratentorial white matter likely reflects sequela of chronic small-vessel ischemic change. The pituitary and suprasellar region are normal. There is no mass lesion. There is no mass effect or midline shift. Vascular: There is calcification of the bilateral carotid siphons. Skull: Normal. Negative for fracture or focal lesion. Sinuses/Orbits: The paranasal sinuses are clear. Bilateral lens implants are in place. The globes and orbits are otherwise unremarkable. Other: None. CT CERVICAL SPINE FINDINGS Alignment: Normal. There is no jumped  or perched facet or other evidence of traumatic malalignment Skull base and vertebrae: Skull base alignment is maintained. Vertebral body heights are preserved. There is no evidence of acute fracture. There is no suspicious osseous lesion. Soft tissues and spinal canal: No prevertebral fluid or swelling. No visible canal hematoma. Disc levels: There is disc space narrowing and degenerative endplate change most advanced at C5-C6 with prominent anterior osteophytes throughout the remainder of the cervical spine. There is multilevel facet arthropathy most advanced on the left at C3-C4 through C5-C6. There is no evidence of high-grade spinal canal stenosis. Upper chest: There is right worse than left apical scarring. Other: None. IMPRESSION: 1. No acute intracranial pathology. 2. No acute fracture or traumatic malalignment of the cervical spine. Electronically Signed   By: Valetta Mole M.D.   On:  09/07/2022 12:47   DG Ankle Complete Left  Result Date: 09/07/2022 CLINICAL DATA:  Trauma, fall EXAM: LEFT ANKLE COMPLETE - 3+ VIEW COMPARISON:  None Available. FINDINGS: There is bimalleolar fracture dislocation. Comminuted displaced fractures are seen in the base of medial malleolus and the distal fibula. There is lateral displacement of talus in relation to distal tibia. There is abnormal widening of joint space between the distal tibia and talus in the anterior aspect. IMPRESSION: Comminuted displaced fractures are seen in distal tibia and fibula. There is lateral dislocation in the left ankle. Electronically Signed   By: Elmer Picker M.D.   On: 09/07/2022 11:40   DG Pelvis Portable  Result Date: 09/07/2022 CLINICAL DATA:  Trauma, fall EXAM: PORTABLE PELVIS 1-2 VIEWS COMPARISON:  None Available. FINDINGS: No displaced fracture or dislocation is seen. Degenerative changes are noted in lumbar spine. There is previous surgical fusion at the L4-L5 level. IMPRESSION: No fracture or dislocation is seen in pelvis. Electronically Signed   By: Elmer Picker M.D.   On: 09/07/2022 11:38   DG Chest Port 1 View  Result Date: 09/07/2022 CLINICAL DATA:  Status post fall. EXAM: PORTABLE CHEST 1 VIEW COMPARISON:  Radiographs 03/10/2022 and 02/06/2021. FINDINGS: 1122 hours. The heart size and mediastinal contours are stable. The lungs appear unchanged with mild chronic interstitial prominence. No edema, confluent airspace opacity, pleural effusion or pneumothorax. No acute fractures are identified. There are asymmetric glenohumeral degenerative changes on the left and mild thoracic spondylosis. Telemetry leads overlie the chest. IMPRESSION: No evidence of acute chest injury. Stable mild chronic interstitial prominence. Electronically Signed   By: Richardean Sale M.D.   On: 09/07/2022 11:38   _______________________________________________________________________________________________________ Latest   Blood pressure (!) 145/67, pulse 81, temperature (!) 97.5 F (36.4 C), temperature source Oral, resp. rate 19, height '5\' 5"'$  (1.651 m), weight 72 kg, SpO2 95 %.   Vitals  labs and radiology finding personally reviewed  Review of Systems:    Pertinent positives include: fall  Constitutional:  No weight loss, night sweats, Fevers, chills, fatigue, weight loss  HEENT:  No headaches, Difficulty swallowing,Tooth/dental problems,Sore throat,  No sneezing, itching, ear ache, nasal congestion, post nasal drip,  Cardio-vascular:  No chest pain, Orthopnea, PND, anasarca, dizziness, palpitations.no Bilateral lower extremity swelling  GI:  No heartburn, indigestion, abdominal pain, nausea, vomiting, diarrhea, change in bowel habits, loss of appetite, melena, blood in stool, hematemesis Resp:  no shortness of breath at rest. No dyspnea on exertion, No excess mucus, no productive cough, No non-productive cough, No coughing up of blood.No change in color of mucus.No wheezing. Skin:  no rash or lesions. No jaundice GU:  no dysuria, change in  color of urine, no urgency or frequency. No straining to urinate.  No flank pain.  Musculoskeletal:  No joint pain or no joint swelling. No decreased range of motion. No back pain.  Psych:  No change in mood or affect. No depression or anxiety. No memory loss.  Neuro: no localizing neurological complaints, no tingling, no weakness, no double vision, no gait abnormality, no slurred speech, no confusion  All systems reviewed and apart from [place] all are negative _______________________________________________________________________________________________ Past Medical History:   Past Medical History:  Diagnosis Date   Anemia    Arthritis    Asthma    CKD (chronic kidney disease) stage 4, GFR 15-29 ml/min (HCC)    Depression    Diabetes mellitus without complication (HCC)    GERD (gastroesophageal reflux disease)    Hypertension    Stroke ([place])    2019  per patient   Wears dentures    lowers, upper partial   Wears hearing aid     Past Surgical History:  Procedure Laterality Date   BREAST LUMPECTOMY Left    CESAREAN SECTION     x 1   COLONOSCOPY     IR FLUORO GUIDED NEEDLE PLC ASPIRATION/INJECTION LOC  07/05/2022   KYPHOPLASTY N/A 04/06/2022   Procedure: Lumbar one KYPHOPLASTY;  Surgeon: Judith Part, MD;  Location: [facility];  Service: Neurosurgery;  Laterality: N/A;   LUMBAR LAMINECTOMY/ DECOMPRESSION WITH MET-RX N/A 03/09/2022   Procedure: Lumbar Four-Five Minimally Invasive Laminectomy/Metrx;  Surgeon: Judith Part, MD;  Location: [facility];  Service: Neurosurgery;  Laterality: N/A;  3C/RM 20 to follow   LUMBAR WOUND DEBRIDEMENT N/A 03/11/2022   Procedure: Exploration of Lumbar Surgical Wound;  Surgeon: Earnie Larsson, MD;  Location: [facility];  Service: Neurosurgery;  Laterality: N/A;   ORIF ANKLE FRACTURE Right 02/10/2021   Procedure: OPEN REDUCTION INTERNAL FIXATION (ORIF) ANKLE FRACTURE;  Surgeon: Shona Needles, MD;  Location: [facility];  Service: Orthopedics;  Laterality: Right;   TRANSFORAMINAL LUMBAR INTERBODY FUSION (TLIF) WITH PEDICLE SCREW FIXATION 1 LEVEL N/A 07/25/2022   Procedure: Lumbar four-five Open Laminectomy/Transforaminal Lumbar Interbody Fusion/Posterolateral instrumented fusion;  Surgeon: Judith Part, MD;  Location: [facility];  Service: Neurosurgery;  Laterality: N/A;    Social History:  Ambulatory   wheelchair bound      reports that she quit smoking about 55 years ago. Her smoking use included cigarettes. She has never used smokeless tobacco. She reports that she does not currently use alcohol. She reports that she does not use drugs.    Family History:   History reviewed. No pertinent family history. ______________________________________________________________________________________________ Allergies: Allergies  Allergen Reactions   Cat Hair Extract Other (See Comments)    Runny nose     Prior  to Admission medications   Medication Sig Start Date End Date Taking? Authorizing Provider  acetaminophen (TYLENOL) 500 MG tablet Take 1,000-1,500 mg by mouth every 8 (eight) hours as needed for moderate pain.    [provider]  albuterol (VENTOLIN HFA) 108 (90 Base) MCG/ACT inhaler Inhale 2 puffs into the lungs every 6 (six) hours as needed for wheezing or shortness of breath.    [provider]  atorvastatin (LIPITOR) 40 MG tablet Take 40 mg by mouth at bedtime.    [provider]  beclomethasone (QVAR) 40 MCG/ACT inhaler Inhale 1 puff into the lungs 2 (two) times daily as needed (shortness of breath).    [provider]  cetirizine (ZYRTEC) 10 MG tablet Take 10 mg  by mouth daily.    [provider]  clopidogrel (PLAVIX) 75 MG tablet Take 1 tablet (75 mg total) by mouth in the morning. Restart on 04/07/22 08/01/22   Collene Schlichter, PA-C  Cyanocobalamin (B-12 PO) Take 1 tablet by mouth 3 (three) times a week.    [provider]  cyclobenzaprine (FLEXERIL) 10 MG tablet Take 1 tablet (10 mg total) by mouth 3 (three) times daily as needed for muscle spasms. 07/26/22   Cosentino, Laurin Coder, PA-C  diclofenac Sodium (VOLTAREN) 1 % GEL Apply 2 g topically daily as needed (pain).    [provider]  diphenhydramine-acetaminophen (TYLENOL PM) 25-500 MG TABS tablet Take 2 tablets by mouth at bedtime as needed (sleep).    [provider]  Ferrous Sulfate (IRON PO) Take 1 capsule by mouth daily.    [provider]  Fish Oil-Krill Oil (KRILL & FISH OIL BLEND PO) Take 1 capsule by mouth daily after breakfast.    [provider]  gabapentin (NEURONTIN) 300 MG capsule Take 300 mg by mouth daily after supper. 11/26/17   [provider]  glipiZIDE (GLUCOTROL XL) 2.5 MG 24 hr tablet Take 2.5 mg by mouth in the morning. 09/28/20   [provider]  hydrochlorothiazide (HYDRODIURIL) 25 MG tablet Take 25 mg by  mouth daily.    [provider]  losartan (COZAAR) 50 MG tablet Take 50 mg by mouth daily.    [provider]  MAGNESIUM PO Take 1 tablet by mouth daily.    [provider]  Menthol, Topical Analgesic, (ICY HOT EX) Apply 1 Application topically daily as needed (pain).    [provider]  montelukast (SINGULAIR) 10 MG tablet Take 10 mg by mouth daily after supper.    [provider]  oxyCODONE (OXY IR/ROXICODONE) 5 MG immediate release tablet Take 1 tablet (5 mg total) by mouth every 4 (four) hours as needed for severe pain. 07/31/22   Collene Schlichter, PA-C  pantoprazole (PROTONIX) 40 MG tablet Take 40 mg by mouth daily before breakfast.    [provider]  sertraline (ZOLOFT) 100 MG tablet Take 100 mg by mouth daily after supper. 02/18/18   [provider]  VITAMIN D PO Take 1 tablet by mouth daily.    [provider]    ___________________________________________________________________________________________________ Physical Exam:    09/07/2022    6:00 PM 09/07/2022    5:30 PM 09/07/2022    5:19 PM  Vitals with BMI  Systolic Q000111Q Q000111Q [id number]  Diastolic 67 60 70  Pulse 81 78 71    1. General:  in No  Acute distress    Chronically ill-appearing 2. Psychological: Alert and   Oriented 3. Head/ENT:  Dry Mucous Membranes                          Head Non traumatic, neck supple                           Poor Dentition 4. SKIN:  decreased Skin turgor,  Skin clean Dry and intact right lower extremity is slightly hot and red improved from prior as per family left lower EXTR in a cast 5. Heart: Regular rate and rhythm  Murmur, no Rub or gallop 6. Lungs:   no wheezes or crackles   7. Abdomen: Soft,  non-tender, Non distended  obese  bowel sounds present 8. Lower extremities:  no clubbing, cyanosis, no  edema  9. Neurologically Grossly intact, moving all 4 extremities equally   10. MSK: Normal range of motion    Chart has  been reviewed  _________________________________________________________________________  Assessment/Plan 82 y.o. female with medical history significant of Dm2,  CKD, anemia, HLD,  Spondylolisthesis, lumbar region sp luminectomy   Admitted for   Fall, initial encounter  Closed bimalleolar fracture of left ankle, initial encounter     Present on Admission:  Ankle fracture  CKD (chronic kidney disease), stage III ([place])  Lumbar stenosis with neurogenic claudication  Hypokalemia  Cellulitis in diabetic foot (HCC)  Chronic combined systolic and diastolic CHF (congestive heart failure) (HCC)  Asthma, chronic  Anemia     CKD (chronic kidney disease), stage III ([place])  -chronic avoid nephrotoxic medications such as NSAIDs, Vanco Zosyn combo,  avoid hypotension, continue to follow renal function   Lumbar stenosis with neurogenic claudication Status postlaminectomy currently wheelchair-bound likely need placement  Ankle fracture Orthopedics aware appreciate their consult.  Nonsurgical to start supportive management pain control PT OT as needed most likely will need placement  Hypokalemia Will replace check magnesium level replace electrolytes as needed  Cellulitis in diabetic foot (HCC) -admit per  cellulitis protocol will     continue current antibiotic choice patient has been taking Augmentin as an outpatient will change to Rocephin while hospitalized      plain films showed:   no evidence of air  no evidence of osteomyelitis   no               foreign   objects       Will obtain MRSA screening,       obtain blood cultures  if febrile or septic     further antibiotic adjustment pending above results   Chronic combined systolic and diastolic CHF (congestive heart failure) (HCC) Currently appears to be slightly on the dry side we will gently rehydrate and follow fluid status avoid fluid overload  Asthma, chronic Chronic stable continue home medications albuterol as needed  Pulmicort is twice daily and Singulair 10 mg a day  Anemia Chronic obtain anemia panel and monitor for any sign of bleeding  History of stroke Continue Plavix 75 mg a day Patient has mild residual right-sided deficits  DM2 (diabetes mellitus, type 2) ([place]) Order sliding scale and diabetic diet check hemoglobin A1c   Other plan as per orders.  DVT prophylaxis:  SCD     Code Status:    Code Status: Full Code FULL CODE   as per patient  I had personally discussed CODE STATUS with patient     Family Communication:   Family   at  Bedside  plan of care was discussed  with   Daughter   Disposition Plan:     likely will need placement for rehabilitation                          Following barriers for discharge:                            Electrolytes corrected  Will need consultants to evaluate patient prior to discharge                       Would benefit from PT/OT eval prior to DC  Ordered                                      Consults called: orthopedics are aware    Admission status:  ED Disposition     ED Disposition  Branch: [facility] [[id number]]  Level of Care: Telemetry Medical [[id number]]  May admit patient to [facility] or [facility] if equivalent level of care is available:: No  Covid Evaluation: Asymptomatic - no recent exposure (last 10 days) testing not required  Diagnosis: Ankle fracture [id number]  Admitting Physician: [name] [[id number]]  Attending Physician: [name] [id number]  Certification:: I certify this patient will need inpatient services for at least 2 midnights  Estimated Length of Stay: 2           inpatient     I Expect 2 midnight stay secondary to severity of patient's current illness need for inpatient interventions justified by the following:  Severe lab/radiological/exam abnormalities including:   Left ankle  fracture and extensive comorbidities including:  DM2   CHF asthma   CKD   That are currently affecting medical management.   I expect  patient to be hospitalized for 2 midnights requiring inpatient medical care.  Patient is at high risk for adverse outcome (such as loss of life or disability) if not treated.  Indication for inpatient stay as follows:    severe pain requiring acute inpatient management,     Need for IV antibiotics, IV fluids,  IV pain medications,      Level of care     tele  For 12H       Amy Belloso 09/07/2022, 9:41 PM    [facility]     after 2 AM please page floor coverage PA If 7AM-7PM, please contact the day team taking care of the patient using [email or web address]   Patient was evaluated in the context of the global COVID-19 pandemic, which necessitated consideration that the patient might be at risk for infection with the SARS-CoV-2 virus that causes COVID-19. Institutional protocols and algorithms that pertain to the evaluation of patients at risk for COVID-19 are in a state of rapid change based on information released by regulatory bodies including the CDC and federal and state organizations. These policies and algorithms were followed during the patient's care.

## 2022-09-07 NOTE — ED Notes (Signed)
ED TO INPATIENT HANDOFF REPORT  ED Nurse Name and Phone #:  Jonelle [facility] [phone]  S Name/Age/Gender Teresa Daniels 82 y.o. female Room/Bed: 029C/029C  Code Status   Code Status: Full Code  Home/SNF/Other To be deteremined.  Patient oriented to: self, place, time, and situation Is this baseline? Yes   Triage Complete: Triage complete  Chief Complaint Ankle fracture [id number]  Triage Note Pt present to ED from home d/t fall. Per EMS, pt was transferring self from wheelchair to recliner when she fell. Daughter present when pt fell. Pt s/p back surgery in January, discharged to home on Sunday. Pt positive for blood thinners. Pt high risk for fall(three falls in three days). Boot noted to right foot s/p previous fall. Insulated boot noted to left leg, pt c/o pain to back of ankle.   Allergies Allergies  Allergen Reactions   Cat Hair Extract Other (See Comments)    Runny nose    Level of Care/Admitting Diagnosis ED Disposition     ED Disposition  Admit   Condition  --   Comment  Hospital Area: [place] [[id number]]  Level of Care: Telemetry Medical [[id number]]  May admit patient to [facility] or [facility] if equivalent level of care is available:: No  Covid Evaluation: Asymptomatic - no recent exposure (last 10 days) testing not required  Diagnosis: Ankle fracture [id number]  Admitting Physician: [name] [id number]  Attending Physician: [name] [id number]  Certification:: I certify this patient will need inpatient services for at least 2 midnights  Estimated Length of Stay: 2          B Medical/Surgery History Past Medical History:  Diagnosis Date   Anemia    Arthritis    Asthma    CKD (chronic kidney disease) stage 4, GFR 15-29 ml/min (HCC)    Depression    Diabetes mellitus without complication (HCC)    GERD (gastroesophageal reflux disease)    Hypertension    Stroke ([place])    2019 per patient   Wears dentures    lowers, upper  partial   Wears hearing aid    Past Surgical History:  Procedure Laterality Date   BREAST LUMPECTOMY Left    CESAREAN SECTION     x 1   COLONOSCOPY     IR FLUORO GUIDED NEEDLE PLC ASPIRATION/INJECTION LOC  07/05/2022   KYPHOPLASTY N/A 04/06/2022   Procedure: Lumbar one KYPHOPLASTY;  Surgeon: Judith Part, MD;  Location: [place];  Service: Neurosurgery;  Laterality: N/A;   LUMBAR LAMINECTOMY/ DECOMPRESSION WITH MET-RX N/A 03/09/2022   Procedure: Lumbar Four-Five Minimally Invasive Laminectomy/Metrx;  Surgeon: Judith Part, MD;  Location: [place];  Service: Neurosurgery;  Laterality: N/A;  3C/RM 20 to follow   LUMBAR WOUND DEBRIDEMENT N/A 03/11/2022   Procedure: Exploration of Lumbar Surgical Wound;  Surgeon: Earnie Larsson, MD;  Location: [place];  Service: Neurosurgery;  Laterality: N/A;   ORIF ANKLE FRACTURE Right 02/10/2021   Procedure: OPEN REDUCTION INTERNAL FIXATION (ORIF) ANKLE FRACTURE;  Surgeon: Shona Needles, MD;  Location: [place];  Service: Orthopedics;  Laterality: Right;   TRANSFORAMINAL LUMBAR INTERBODY FUSION (TLIF) WITH PEDICLE SCREW FIXATION 1 LEVEL N/A 07/25/2022   Procedure: Lumbar four-five Open Laminectomy/Transforaminal Lumbar Interbody Fusion/Posterolateral instrumented fusion;  Surgeon: Judith Part, MD;  Location: [place];  Service: Neurosurgery;  Laterality: N/A;     A IV Location/Drains/Wounds Patient Lines/Drains/Airways Status     Active Line/Drains/Airways     Name Placement date Placement time  Site Days   Peripheral IV 09/07/22 20 G 1" Right Antecubital 09/07/22  1357  Antecubital  less than 1   External Urinary Catheter 07/26/22  0005  --  43            Intake/Output Last 24 hours No intake or output data in the 24 hours ending 09/07/22 1925  Labs/Imaging Results for orders placed or performed during the hospital encounter of 09/07/22 (from the past 48 hour(s))  Basic metabolic panel     Status: Abnormal   Collection Time: 09/07/22   5:18 PM  Result Value Ref Range   Sodium 139 135 - 145 mmol/L   Potassium 3.2 (L) 3.5 - 5.1 mmol/L   Chloride 101 98 - 111 mmol/L   CO2 27 22 - 32 mmol/L   Glucose, Bld 133 (H) 70 - 99 mg/dL    Comment: Glucose reference range applies only to samples taken after fasting for at least 8 hours.   BUN 20 8 - 23 mg/dL   Creatinine, Ser 1.28 (H) 0.44 - 1.00 mg/dL   Calcium 9.0 8.9 - 10.3 mg/dL   GFR, Estimated 42 (L) >60 mL/min    Comment: (NOTE) Calculated using the CKD-EPI Creatinine Equation (2021)    Anion gap 11 5 - 15    Comment: Performed at [facility] [address], [place], [place] [address]  CBC with Differential     Status: Abnormal   Collection Time: 09/07/22  5:18 PM  Result Value Ref Range   WBC 6.8 4.0 - 10.5 K/uL   RBC 2.96 (L) 3.87 - 5.11 MIL/uL   Hemoglobin 9.8 (L) 12.0 - 15.0 g/dL   HCT 29.9 (L) 36.0 - 46.0 %   MCV 101.0 (H) 80.0 - 100.0 fL   MCH 33.1 26.0 - 34.0 pg   MCHC 32.8 30.0 - 36.0 g/dL   RDW 13.0 11.5 - 15.5 %   Platelets 224 150 - 400 K/uL   nRBC 0.0 0.0 - 0.2 %   Neutrophils Relative % 73 %   Neutro Abs 4.9 1.7 - 7.7 K/uL   Lymphocytes Relative 15 %   Lymphs Abs 1.0 0.7 - 4.0 K/uL   Monocytes Relative 7 %   Monocytes Absolute 0.5 0.1 - 1.0 K/uL   Eosinophils Relative 4 %   Eosinophils Absolute 0.3 0.0 - 0.5 K/uL   Basophils Relative 1 %   Basophils Absolute 0.0 0.0 - 0.1 K/uL   Immature Granulocytes 0 %   Abs Immature Granulocytes 0.02 0.00 - 0.07 K/uL    Comment: Performed at [facility] [address], [place], [place] [address]  Magnesium     Status: None   Collection Time: 09/07/22  5:26 PM  Result Value Ref Range   Magnesium 1.9 1.7 - 2.4 mg/dL    Comment: Performed at [facility], [place] [address] [place], [place] [address]   VAS [place] LOWER EXTREMITY VENOUS (DVT) (7a-7p)  Result Date: 09/07/2022  Lower Venous DVT Study Patient Name:  Teresa Daniels  Date of Exam:   09/07/2022 Medical Rec #: [id number]       Accession  #:    [id number] Date of Birth: 06-04-1941      Patient Gender: F Patient Age:   65 years Exam Location:  [facility] Procedure:      VAS [place] LOWER EXTREMITY VENOUS (DVT) Referring Phys: Leanord Asal --------------------------------------------------------------------------------  Indications: Right calf erythema, swelling.  Comparison Study: No prior studies.  Performing Technologist: Darlin Coco RDMS, RVT  Examination Guidelines: A complete evaluation includes B-mode imaging, spectral Doppler, color Doppler, and power Doppler as needed of all accessible portions of each vessel. Bilateral testing is considered an integral part of a complete examination. Limited examinations for reoccurring indications may be performed as noted. The reflux portion of the exam is performed with the patient in reverse Trendelenburg.  +---------+---------------+---------+-----------+----------+--------------+ RIGHT    CompressibilityPhasicitySpontaneityPropertiesThrombus Aging +---------+---------------+---------+-----------+----------+--------------+ CFV      Full           Yes      Yes                                 +---------+---------------+---------+-----------+----------+--------------+ SFJ      Full                                                        +---------+---------------+---------+-----------+----------+--------------+ FV Prox  Full                                                        +---------+---------------+---------+-----------+----------+--------------+ FV Mid   Full                                                        +---------+---------------+---------+-----------+----------+--------------+ FV DistalFull                                                        +---------+---------------+---------+-----------+----------+--------------+ PFV      Full                                                         +---------+---------------+---------+-----------+----------+--------------+ POP      Full           Yes      Yes                                 +---------+---------------+---------+-----------+----------+--------------+ PTV      Full                                                        +---------+---------------+---------+-----------+----------+--------------+ PERO     Full                                                        +---------+---------------+---------+-----------+----------+--------------+   +----+---------------+---------+-----------+----------+--------------+  LEFTCompressibilityPhasicitySpontaneityPropertiesThrombus Aging +----+---------------+---------+-----------+----------+--------------+ CFV Full           Yes      Yes                                 +----+---------------+---------+-----------+----------+--------------+     Summary: RIGHT: - There is no evidence of deep vein thrombosis in the lower extremity.  - No cystic structure found in the popliteal fossa.  LEFT: - No evidence of common femoral vein obstruction.  *See table(s) above for measurements and observations.    Preliminary    DG Ankle 2 Views Left  Result Date: 09/07/2022 CLINICAL DATA:  Ankle fracture, postreduction. EXAM: LEFT ANKLE - 2 VIEW COMPARISON:  Preoperative radiograph earlier today. FINDINGS: Improved alignment of bimalleolar fracture postreduction. Overlying splint material limits osseous and soft tissue fine detail. The ankle mortise is currently congruent. No obvious posterior tibial tubercle fracture is seen. IMPRESSION: Improved alignment of bimalleolar fracture postreduction. Electronically Signed   By: Keith Rake M.D.   On: 09/07/2022 15:54   DG Knee Left Port  Result Date: 09/07/2022 CLINICAL DATA:  Ankle fracture.  Fall. EXAM: PORTABLE LEFT KNEE - 1-2 VIEW COMPARISON:  None Available. FINDINGS: There is minimal joint space narrowing of the MEDIAL compartment. No  acute fracture or subluxation. No significant joint effusion. No radiopaque foreign body. IMPRESSION: Minimal degenerative changes. No evidence for acute abnormality. Electronically Signed   By: Nolon Nations M.D.   On: 09/07/2022 15:52   CT Head Wo Contrast  Result Date: 09/07/2022 CLINICAL DATA:  Fall EXAM: CT HEAD WITHOUT CONTRAST CT CERVICAL SPINE WITHOUT CONTRAST TECHNIQUE: Multidetector CT imaging of the head and cervical spine was performed following the standard protocol without intravenous contrast. Multiplanar CT image reconstructions of the cervical spine were also generated. RADIATION DOSE REDUCTION: This exam was performed according to the departmental dose-optimization program which includes automated exposure control, adjustment of the mA and/or kV according to patient size and/or use of iterative reconstruction technique. COMPARISON:  CT head and cervical spine 03/08/2018 FINDINGS: CT HEAD FINDINGS Brain: There is no acute intracranial hemorrhage, extra-axial fluid collection, or acute infarct Parenchymal volume is normal for age. The ventricles are normal in size. Gray-white differentiation is preserved. Patchy hypodensity in the supratentorial white matter likely reflects sequela of chronic small-vessel ischemic change. The pituitary and suprasellar region are normal. There is no mass lesion. There is no mass effect or midline shift. Vascular: There is calcification of the bilateral carotid siphons. Skull: Normal. Negative for fracture or focal lesion. Sinuses/Orbits: The paranasal sinuses are clear. Bilateral lens implants are in place. The globes and orbits are otherwise unremarkable. Other: None. CT CERVICAL SPINE FINDINGS Alignment: Normal. There is no jumped or perched facet or other evidence of traumatic malalignment Skull base and vertebrae: Skull base alignment is maintained. Vertebral body heights are preserved. There is no evidence of acute fracture. There is no suspicious osseous  lesion. Soft tissues and spinal canal: No prevertebral fluid or swelling. No visible canal hematoma. Disc levels: There is disc space narrowing and degenerative endplate change most advanced at C5-C6 with prominent anterior osteophytes throughout the remainder of the cervical spine. There is multilevel facet arthropathy most advanced on the left at C3-C4 through C5-C6. There is no evidence of high-grade spinal canal stenosis. Upper chest: There is right worse than left apical scarring. Other: None. IMPRESSION: 1. No acute intracranial pathology. 2. No acute fracture or traumatic  malalignment of the cervical spine. Electronically Signed   By: Valetta Mole M.D.   On: 09/07/2022 12:47   CT Cervical Spine Wo Contrast  Result Date: 09/07/2022 CLINICAL DATA:  Fall EXAM: CT HEAD WITHOUT CONTRAST CT CERVICAL SPINE WITHOUT CONTRAST TECHNIQUE: Multidetector CT imaging of the head and cervical spine was performed following the standard protocol without intravenous contrast. Multiplanar CT image reconstructions of the cervical spine were also generated. RADIATION DOSE REDUCTION: This exam was performed according to the departmental dose-optimization program which includes automated exposure control, adjustment of the mA and/or kV according to patient size and/or use of iterative reconstruction technique. COMPARISON:  CT head and cervical spine 03/08/2018 FINDINGS: CT HEAD FINDINGS Brain: There is no acute intracranial hemorrhage, extra-axial fluid collection, or acute infarct Parenchymal volume is normal for age. The ventricles are normal in size. Gray-white differentiation is preserved. Patchy hypodensity in the supratentorial white matter likely reflects sequela of chronic small-vessel ischemic change. The pituitary and suprasellar region are normal. There is no mass lesion. There is no mass effect or midline shift. Vascular: There is calcification of the bilateral carotid siphons. Skull: Normal. Negative for fracture or  focal lesion. Sinuses/Orbits: The paranasal sinuses are clear. Bilateral lens implants are in place. The globes and orbits are otherwise unremarkable. Other: None. CT CERVICAL SPINE FINDINGS Alignment: Normal. There is no jumped or perched facet or other evidence of traumatic malalignment Skull base and vertebrae: Skull base alignment is maintained. Vertebral body heights are preserved. There is no evidence of acute fracture. There is no suspicious osseous lesion. Soft tissues and spinal canal: No prevertebral fluid or swelling. No visible canal hematoma. Disc levels: There is disc space narrowing and degenerative endplate change most advanced at C5-C6 with prominent anterior osteophytes throughout the remainder of the cervical spine. There is multilevel facet arthropathy most advanced on the left at C3-C4 through C5-C6. There is no evidence of high-grade spinal canal stenosis. Upper chest: There is right worse than left apical scarring. Other: None. IMPRESSION: 1. No acute intracranial pathology. 2. No acute fracture or traumatic malalignment of the cervical spine. Electronically Signed   By: Valetta Mole M.D.   On: 09/07/2022 12:47   DG Ankle Complete Left  Result Date: 09/07/2022 CLINICAL DATA:  Trauma, fall EXAM: LEFT ANKLE COMPLETE - 3+ VIEW COMPARISON:  None Available. FINDINGS: There is bimalleolar fracture dislocation. Comminuted displaced fractures are seen in the base of medial malleolus and the distal fibula. There is lateral displacement of talus in relation to distal tibia. There is abnormal widening of joint space between the distal tibia and talus in the anterior aspect. IMPRESSION: Comminuted displaced fractures are seen in distal tibia and fibula. There is lateral dislocation in the left ankle. Electronically Signed   By: Elmer Picker M.D.   On: 09/07/2022 11:40   DG Pelvis Portable  Result Date: 09/07/2022 CLINICAL DATA:  Trauma, fall EXAM: PORTABLE PELVIS 1-2 VIEWS COMPARISON:  None  Available. FINDINGS: No displaced fracture or dislocation is seen. Degenerative changes are noted in lumbar spine. There is previous surgical fusion at the L4-L5 level. IMPRESSION: No fracture or dislocation is seen in pelvis. Electronically Signed   By: Elmer Picker M.D.   On: 09/07/2022 11:38   DG Chest Port 1 View  Result Date: 09/07/2022 CLINICAL DATA:  Status post fall. EXAM: PORTABLE CHEST 1 VIEW COMPARISON:  Radiographs 03/10/2022 and 02/06/2021. FINDINGS: 1122 hours. The heart size and mediastinal contours are stable. The lungs appear unchanged with mild chronic  interstitial prominence. No edema, confluent airspace opacity, pleural effusion or pneumothorax. No acute fractures are identified. There are asymmetric glenohumeral degenerative changes on the left and mild thoracic spondylosis. Telemetry leads overlie the chest. IMPRESSION: No evidence of acute chest injury. Stable mild chronic interstitial prominence. Electronically Signed   By: Richardean Sale M.D.   On: 09/07/2022 11:38    Pending Labs Unresulted Labs (From admission, onward)     Start     Ordered   09/08/22 0500  Prealbumin  Tomorrow morning,   R        09/07/22 1920   09/07/22 1921  CK  Add-on,   AD        09/07/22 1920   09/07/22 1921  Osmolality  Add-on,   AD        09/07/22 1920   09/07/22 1921  Osmolality, urine  Once,   R        09/07/22 1920   09/07/22 1921  Creatinine, urine, random  Once,   R        09/07/22 1920   09/07/22 1921  TSH  Add-on,   AD        09/07/22 1920   09/07/22 1921  Sodium, urine, random  Once,   R        09/07/22 1920   09/07/22 1921  Urinalysis, Complete w Microscopic -Urine, Clean Catch  Once,   R       Question Answer Comment  Release to patient Immediate   Specimen Source Urine, Clean Catch      09/07/22 1920   09/07/22 1921  Phosphorus  Add-on,   AD        09/07/22 1920   09/07/22 1921  Vitamin B12  (Anemia Panel (PNL))  Once,   R        09/07/22 1920   09/07/22 1921   Folate  (Anemia Panel (PNL))  Once,   R        09/07/22 1920   09/07/22 1921  Iron and TIBC  (Anemia Panel (PNL))  Once,   R        09/07/22 1920   09/07/22 1921  Ferritin  (Anemia Panel (PNL))  Once,   R        09/07/22 1920   09/07/22 1921  Reticulocytes  (Anemia Panel (PNL))  Once,   R        09/07/22 1920   09/07/22 1920  Hemoglobin A1c  Once,   R       Comments: To assess prior glycemic control    09/07/22 1919   Signed and Held  CBC  Tomorrow morning,   R        Signed and Held   Signed and Held  Comprehensive metabolic panel  Tomorrow morning,   R        Signed and Held   Signed and Held  VITAMIN D 25 Hydroxy (Vit-D Deficiency, Fractures)  Tomorrow morning,   R        Signed and Held            Vitals/Pain Today's Vitals   09/07/22 1700 09/07/22 1719 09/07/22 1730 09/07/22 1800  BP: 122/85 (!) 156/70 (!) 141/60 (!) 145/67  Pulse: 81 71 78 81  Resp: '17 18 16 19  '$ Temp:      TempSrc:      SpO2: 100% 97% 95% 95%  Weight:      Height:      PainSc:  Isolation Precautions No active isolations  Medications Medications  insulin aspart (novoLOG) injection 0-9 Units (has no administration in time range)  potassium chloride 10 mEq in 100 mL IVPB (has no administration in time range)  etomidate (AMIDATE) injection 10.8 mg (10.8 mg Intravenous Given 09/07/22 1520)  fentaNYL (SUBLIMAZE) injection 50 mcg (50 mcg Intravenous Given 09/07/22 1523)    Mobility non-ambulatory     Focused Assessments    R Recommendations: See Admitting Provider Note  Report given to:   Additional Notes:  pt non ambulatory due to injuries. Continent x2

## 2022-09-07 NOTE — Assessment & Plan Note (Signed)
Currently appears to be slightly on the dry side we will gently rehydrate and follow fluid status avoid fluid overload

## 2022-09-07 NOTE — Consult Note (Signed)
Reason for Consult:Left ankle fx Referring Physician: Leanord Asal Time called: 1326 Time at bedside: [name] is an 82 y.o. female.  HPI: Teresa Daniels was transferring today when she slipped and fell. She had immediate left ankle pain and could not get up or bear weight. She was brought to the ED where x-rays showed a left ankle fx and orthopedic surgery was consulted. She lives in independent living alone and has been WC bound since spinal surgery last month.  Past Medical History:  Diagnosis Date   Anemia    Arthritis    Asthma    CKD (chronic kidney disease) stage 4, GFR 15-29 ml/min (HCC)    Depression    Diabetes mellitus without complication (HCC)    GERD (gastroesophageal reflux disease)    Hypertension    Stroke ([place])    2019 per patient   Wears dentures    lowers, upper partial   Wears hearing aid     Past Surgical History:  Procedure Laterality Date   BREAST LUMPECTOMY Left    CESAREAN SECTION     x 1   COLONOSCOPY     IR FLUORO GUIDED NEEDLE PLC ASPIRATION/INJECTION LOC  07/05/2022   KYPHOPLASTY N/A 04/06/2022   Procedure: Lumbar one KYPHOPLASTY;  Surgeon: Judith Part, MD;  Location: [facility];  Service: Neurosurgery;  Laterality: N/A;   LUMBAR LAMINECTOMY/ DECOMPRESSION WITH MET-RX N/A 03/09/2022   Procedure: Lumbar Four-Five Minimally Invasive Laminectomy/Metrx;  Surgeon: Judith Part, MD;  Location: [facility];  Service: Neurosurgery;  Laterality: N/A;  3C/RM 20 to follow   LUMBAR WOUND DEBRIDEMENT N/A 03/11/2022   Procedure: Exploration of Lumbar Surgical Wound;  Surgeon: Earnie Larsson, MD;  Location: [facility];  Service: Neurosurgery;  Laterality: N/A;   ORIF ANKLE FRACTURE Right 02/10/2021   Procedure: OPEN REDUCTION INTERNAL FIXATION (ORIF) ANKLE FRACTURE;  Surgeon: Shona Needles, MD;  Location: [facility];  Service: Orthopedics;  Laterality: Right;   TRANSFORAMINAL LUMBAR INTERBODY FUSION (TLIF) WITH PEDICLE SCREW FIXATION 1 LEVEL N/A 07/25/2022    Procedure: Lumbar four-five Open Laminectomy/Transforaminal Lumbar Interbody Fusion/Posterolateral instrumented fusion;  Surgeon: Judith Part, MD;  Location: [facility];  Service: Neurosurgery;  Laterality: N/A;    History reviewed. No pertinent family history.  Social History:  reports that she quit smoking about 55 years ago. Her smoking use included cigarettes. She has never used smokeless tobacco. She reports that she does not currently use alcohol. She reports that she does not use drugs.  Allergies:  Allergies  Allergen Reactions   Cat Hair Extract Other (See Comments)    Runny nose    Medications: I have reviewed the patient's current medications.  No results found for this or any previous visit (from the past 48 hour(s)).  CT Head Wo Contrast  Result Date: 09/07/2022 CLINICAL DATA:  Fall EXAM: CT HEAD WITHOUT CONTRAST CT CERVICAL SPINE WITHOUT CONTRAST TECHNIQUE: Multidetector CT imaging of the head and cervical spine was performed following the standard protocol without intravenous contrast. Multiplanar CT image reconstructions of the cervical spine were also generated. RADIATION DOSE REDUCTION: This exam was performed according to the departmental dose-optimization program which includes automated exposure control, adjustment of the mA and/or kV according to patient size and/or use of iterative reconstruction technique. COMPARISON:  CT head and cervical spine 03/08/2018 FINDINGS: CT HEAD FINDINGS Brain: There is no acute intracranial hemorrhage, extra-axial fluid collection, or acute infarct Parenchymal volume is normal for age. The ventricles are normal in size.  Gray-white differentiation is preserved. Patchy hypodensity in the supratentorial white matter likely reflects sequela of chronic small-vessel ischemic change. The pituitary and suprasellar region are normal. There is no mass lesion. There is no mass effect or midline shift. Vascular: There is calcification of the bilateral  carotid siphons. Skull: Normal. Negative for fracture or focal lesion. Sinuses/Orbits: The paranasal sinuses are clear. Bilateral lens implants are in place. The globes and orbits are otherwise unremarkable. Other: None. CT CERVICAL SPINE FINDINGS Alignment: Normal. There is no jumped or perched facet or other evidence of traumatic malalignment Skull base and vertebrae: Skull base alignment is maintained. Vertebral body heights are preserved. There is no evidence of acute fracture. There is no suspicious osseous lesion. Soft tissues and spinal canal: No prevertebral fluid or swelling. No visible canal hematoma. Disc levels: There is disc space narrowing and degenerative endplate change most advanced at C5-C6 with prominent anterior osteophytes throughout the remainder of the cervical spine. There is multilevel facet arthropathy most advanced on the left at C3-C4 through C5-C6. There is no evidence of high-grade spinal canal stenosis. Upper chest: There is right worse than left apical scarring. Other: None. IMPRESSION: 1. No acute intracranial pathology. 2. No acute fracture or traumatic malalignment of the cervical spine. Electronically Signed   By: Valetta Mole M.D.   On: 09/07/2022 12:47   CT Cervical Spine Wo Contrast  Result Date: 09/07/2022 CLINICAL DATA:  Fall EXAM: CT HEAD WITHOUT CONTRAST CT CERVICAL SPINE WITHOUT CONTRAST TECHNIQUE: Multidetector CT imaging of the head and cervical spine was performed following the standard protocol without intravenous contrast. Multiplanar CT image reconstructions of the cervical spine were also generated. RADIATION DOSE REDUCTION: This exam was performed according to the departmental dose-optimization program which includes automated exposure control, adjustment of the mA and/or kV according to patient size and/or use of iterative reconstruction technique. COMPARISON:  CT head and cervical spine 03/08/2018 FINDINGS: CT HEAD FINDINGS Brain: There is no acute  intracranial hemorrhage, extra-axial fluid collection, or acute infarct Parenchymal volume is normal for age. The ventricles are normal in size. Gray-white differentiation is preserved. Patchy hypodensity in the supratentorial white matter likely reflects sequela of chronic small-vessel ischemic change. The pituitary and suprasellar region are normal. There is no mass lesion. There is no mass effect or midline shift. Vascular: There is calcification of the bilateral carotid siphons. Skull: Normal. Negative for fracture or focal lesion. Sinuses/Orbits: The paranasal sinuses are clear. Bilateral lens implants are in place. The globes and orbits are otherwise unremarkable. Other: None. CT CERVICAL SPINE FINDINGS Alignment: Normal. There is no jumped or perched facet or other evidence of traumatic malalignment Skull base and vertebrae: Skull base alignment is maintained. Vertebral body heights are preserved. There is no evidence of acute fracture. There is no suspicious osseous lesion. Soft tissues and spinal canal: No prevertebral fluid or swelling. No visible canal hematoma. Disc levels: There is disc space narrowing and degenerative endplate change most advanced at C5-C6 with prominent anterior osteophytes throughout the remainder of the cervical spine. There is multilevel facet arthropathy most advanced on the left at C3-C4 through C5-C6. There is no evidence of high-grade spinal canal stenosis. Upper chest: There is right worse than left apical scarring. Other: None. IMPRESSION: 1. No acute intracranial pathology. 2. No acute fracture or traumatic malalignment of the cervical spine. Electronically Signed   By: Valetta Mole M.D.   On: 09/07/2022 12:47   DG Ankle Complete Left  Result Date: 09/07/2022 CLINICAL DATA:  Trauma,  fall EXAM: LEFT ANKLE COMPLETE - 3+ VIEW COMPARISON:  None Available. FINDINGS: There is bimalleolar fracture dislocation. Comminuted displaced fractures are seen in the base of medial  malleolus and the distal fibula. There is lateral displacement of talus in relation to distal tibia. There is abnormal widening of joint space between the distal tibia and talus in the anterior aspect. IMPRESSION: Comminuted displaced fractures are seen in distal tibia and fibula. There is lateral dislocation in the left ankle. Electronically Signed   By: Elmer Picker M.D.   On: 09/07/2022 11:40   DG Pelvis Portable  Result Date: 09/07/2022 CLINICAL DATA:  Trauma, fall EXAM: PORTABLE PELVIS 1-2 VIEWS COMPARISON:  None Available. FINDINGS: No displaced fracture or dislocation is seen. Degenerative changes are noted in lumbar spine. There is previous surgical fusion at the L4-L5 level. IMPRESSION: No fracture or dislocation is seen in pelvis. Electronically Signed   By: Elmer Picker M.D.   On: 09/07/2022 11:38   DG Chest Port 1 View  Result Date: 09/07/2022 CLINICAL DATA:  Status post fall. EXAM: PORTABLE CHEST 1 VIEW COMPARISON:  Radiographs 03/10/2022 and 02/06/2021. FINDINGS: 1122 hours. The heart size and mediastinal contours are stable. The lungs appear unchanged with mild chronic interstitial prominence. No edema, confluent airspace opacity, pleural effusion or pneumothorax. No acute fractures are identified. There are asymmetric glenohumeral degenerative changes on the left and mild thoracic spondylosis. Telemetry leads overlie the chest. IMPRESSION: No evidence of acute chest injury. Stable mild chronic interstitial prominence. Electronically Signed   By: Richardean Sale M.D.   On: 09/07/2022 11:38    Review of Systems  HENT:  Negative for ear discharge, ear pain, hearing loss and tinnitus.   Eyes:  Negative for photophobia and pain.  Respiratory:  Negative for cough and shortness of breath.   Cardiovascular:  Negative for chest pain.  Gastrointestinal:  Negative for abdominal pain, nausea and vomiting.  Genitourinary:  Negative for dysuria, flank pain, frequency and urgency.   Musculoskeletal:  Positive for arthralgias (Left ankle). Negative for back pain, myalgias and neck pain.  Neurological:  Negative for dizziness and headaches.  Hematological:  Does not bruise/bleed easily.  Psychiatric/Behavioral:  The patient is not nervous/anxious.    Blood pressure 131/61, pulse 74, temperature (!) 97.5 F (36.4 C), temperature source Oral, resp. rate 18, height '5\' 5"'$  (1.651 m), weight 72 kg, SpO2 96 %. Physical Exam Constitutional:      General: She is not in acute distress.    Appearance: She is well-developed. She is not diaphoretic.  HENT:     Head: Normocephalic and atraumatic.  Eyes:     General: No scleral icterus.       Right eye: No discharge.        Left eye: No discharge.     Conjunctiva/sclera: Conjunctivae normal.  Cardiovascular:     Rate and Rhythm: Normal rate and regular rhythm.  Pulmonary:     Effort: Pulmonary effort is normal. No respiratory distress.  Musculoskeletal:     Cervical back: Normal range of motion.     Comments: LLE No traumatic wounds, ecchymosis, or rash  Mod edema, mod TTP ankle  No knee effusion  Knee stable to varus/ valgus and anterior/posterior stress  Sens TN intact, SPN/DPN absent  Motor EHL 5/5  DP 2+, No significant edema  Skin:    General: Skin is warm and dry.  Neurological:     Mental Status: She is alert.  Psychiatric:  Mood and Affect: Mood normal.        Behavior: Behavior normal.     Assessment/Plan: Left ankle fx -- Will need CR under CS then may discharge with [facility] LLE. F/u with [name] in 1 week to discuss ORIF.    Lisette Abu, PA-C Orthopedic Surgery [phone] 09/07/2022, 1:36 PM

## 2022-09-07 NOTE — Subjective & Objective (Signed)
Patient had a laminectomy procedure on 16 January with neurosurgery [name] she was discharged to rehab [name] and on Sunday was discharged to independent living while she still was on wheelchair transferring to bed.  Patient had multiple falls first resulting in fracture of her left foot toes.  She had another fall today with severe right ankle pain no fevers or chills no chest pain or shortness of breath she did hit her head but did not lose consciousness.  On presentation it was noted to be swelling and a deformity and tenderness of the left ankle

## 2022-09-07 NOTE — Assessment & Plan Note (Addendum)
Will replace check magnesium level replace electrolytes as needed

## 2022-09-07 NOTE — ED Provider Notes (Signed)
Seen after prior EDP  Patient case discussed with Dr. Roel Cluck who will evaluate for admission.   Valarie Merino, MD 09/07/22 [phone]

## 2022-09-07 NOTE — Assessment & Plan Note (Signed)
Chronic obtain anemia panel and monitor for any sign of bleeding

## 2022-09-07 NOTE — ED Provider Notes (Signed)
[facility] Provider Note   CSN: [id number] Arrival date & time: 09/07/22  1104     History  Chief Complaint  Patient presents with  . Fall    Teresa Daniels is a 82 y.o. female.  Patient is an 82 year old female with a PMH recent back surgery, currently wheel-chair bound, presenting to the ED after a fall. The patient states she was trying to transfer from the wheelchair to her couch when she slipped and fell.  Patient states that she did hit her head when she fell but denies loss of consciousness.  She states that she was unable to get herself up due to pain in her left ankle.  She denies any nausea or vomiting, numbness or weakness.  Patient states that she is currently wheelchair-bound due to recent lumbar surgery and also had a fall 3 days ago where she broke 2 of her toes on her left foot.  She states that she was started on prophylactic antibiotics by her podiatrist and they were monitoring redness of her right ankle that she states is mildly gotten worse.  She denies any fevers or chills.  She denied any lightheadedness or dizziness, chest pain or shortness of breath prior to the fall.  The history is provided by the patient, a relative and the EMS personnel.  Fall      Home Medications Prior to Admission medications   Medication Sig Start Date End Date Taking? Authorizing Provider  acetaminophen (TYLENOL) 500 MG tablet Take 1,000-1,500 mg by mouth every 8 (eight) hours as needed for moderate pain.    [provider]  albuterol (VENTOLIN HFA) 108 (90 Base) MCG/ACT inhaler Inhale 2 puffs into the lungs every 6 (six) hours as needed for wheezing or shortness of breath.    [provider]  atorvastatin (LIPITOR) 40 MG tablet Take 40 mg by mouth at bedtime.    [provider]  beclomethasone (QVAR) 40 MCG/ACT inhaler Inhale 1 puff into the lungs 2 (two) times daily as needed (shortness of breath).    [provider]  cetirizine (ZYRTEC) 10 MG tablet Take 10 mg by mouth daily.    [provider]  clopidogrel (PLAVIX) 75 MG tablet Take 1 tablet (75 mg total) by mouth in the morning. Restart on 04/07/22 08/01/22   Collene Schlichter, PA-C  Cyanocobalamin (B-12 PO) Take 1 tablet by mouth 3 (three) times a week.    [provider]  cyclobenzaprine (FLEXERIL) 10 MG tablet Take 1 tablet (10 mg total) by mouth 3 (three) times daily as needed for muscle spasms. 07/26/22   Cosentino, Laurin Coder, PA-C  diclofenac Sodium (VOLTAREN) 1 % GEL Apply 2 g topically daily as needed (pain).    [provider]  diphenhydramine-acetaminophen (TYLENOL PM) 25-500 MG TABS tablet Take 2 tablets by mouth at bedtime as needed (sleep).    [provider]  Ferrous Sulfate (IRON PO) Take 1 capsule by mouth daily.    [provider]  Fish Oil-Krill Oil (KRILL & FISH OIL BLEND PO) Take 1 capsule by mouth daily after breakfast.    [provider]  gabapentin (NEURONTIN) 300 MG capsule Take 300 mg by mouth daily after supper. 11/26/17   [provider]  glipiZIDE (GLUCOTROL XL) 2.5 MG 24 hr tablet Take 2.5 mg by mouth in the morning. 09/28/20   [provider]  hydrochlorothiazide (HYDRODIURIL) 25 MG tablet Take 25 mg by mouth daily.  [provider]  losartan (COZAAR) 50 MG tablet Take 50 mg by mouth daily.    [provider]  MAGNESIUM PO Take 1 tablet by mouth daily.    [provider]  Menthol, Topical Analgesic, (ICY HOT EX) Apply 1 Application topically daily as needed (pain).    [provider]  montelukast (SINGULAIR) 10 MG tablet Take 10 mg by mouth daily after supper.    [provider]  oxyCODONE (OXY IR/ROXICODONE) 5 MG immediate release tablet Take 1 tablet (5 mg total) by mouth every 4 (four) hours as needed for severe pain. 07/31/22   Collene Schlichter, PA-C  pantoprazole (PROTONIX) 40 MG tablet Take  40 mg by mouth daily before breakfast.    [provider]  sertraline (ZOLOFT) 100 MG tablet Take 100 mg by mouth daily after supper. 02/18/18   [provider]  VITAMIN D PO Take 1 tablet by mouth daily.    [provider]      Allergies    Cat hair extract    Review of Systems   Review of Systems  Physical Exam Updated Vital Signs BP (!) 153/74   Pulse 78   Temp (!) 97.5 F (36.4 C) (Oral)   Resp 14   Ht '5\' 5"'$  (1.651 m)   Wt 72 kg   SpO2 97%   BMI 26.41 kg/m  Physical Exam Vitals and nursing note reviewed.  Constitutional:      General: She is not in acute distress.    Appearance: Normal appearance.  HENT:     Head: Normocephalic and atraumatic.     Nose: Nose normal.     Mouth/Throat:     Mouth: Mucous membranes are moist.     Pharynx: Oropharynx is clear.  Eyes:     Extraocular Movements: Extraocular movements intact.     Conjunctiva/sclera: Conjunctivae normal.     Pupils: Pupils are equal, round, and reactive to light.  Neck:     Comments:  No midline neck tenderness Cardiovascular:     Rate and Rhythm: Normal rate and regular rhythm.     Pulses: Normal pulses.     Heart sounds: Normal heart sounds.  Pulmonary:     Effort: Pulmonary effort is normal.     Breath sounds: Normal breath sounds.  Abdominal:     General: Abdomen is flat.     Palpations: Abdomen is soft.     Tenderness: There is no abdominal tenderness.  Musculoskeletal:     Cervical back: Normal range of motion and neck supple.     Comments: No midline back tenderness Pelvis stable, nontender No tenderness to palpation of bilateral upper extremities or right lower extremity Swelling and deformity with tenderness to palpation of left ankle  Skin:    General: Skin is warm and dry.     Comments: Mild erythema and warmth extending beyond previously drawn lines of right shin  Neurological:     General: No focal deficit present.     Mental Status: She is alert and  oriented to person, place, and time.     Sensory: No sensory deficit.     Motor: No weakness.  Psychiatric:        Mood and Affect: Mood normal.        Behavior: Behavior normal.    ED Results / Procedures / Treatments   Labs (all labs ordered are listed, but only abnormal results are displayed) Labs Reviewed  BASIC METABOLIC PANEL  CBC WITH  DIFFERENTIAL/PLATELET    EKG EKG Interpretation  Date/Time:  Thursday September 07 2022 11:10:55 EST Ventricular Rate:  75 PR Interval:  180 QRS Duration: 130 QT Interval:  444 QTC Calculation: 496 R Axis:   -8 Text Interpretation: Sinus rhythm Left bundle branch block No significant change since last tracing Confirmed by Leanord Asal ([id number]) on 09/07/2022 11:52:21 AM  Radiology CT Head Wo Contrast  Result Date: 09/07/2022 CLINICAL DATA:  Fall EXAM: CT HEAD WITHOUT CONTRAST CT CERVICAL SPINE WITHOUT CONTRAST TECHNIQUE: Multidetector CT imaging of the head and cervical spine was performed following the standard protocol without intravenous contrast. Multiplanar CT image reconstructions of the cervical spine were also generated. RADIATION DOSE REDUCTION: This exam was performed according to the departmental dose-optimization program which includes automated exposure control, adjustment of the mA and/or kV according to patient size and/or use of iterative reconstruction technique. COMPARISON:  CT head and cervical spine 03/08/2018 FINDINGS: CT HEAD FINDINGS Brain: There is no acute intracranial hemorrhage, extra-axial fluid collection, or acute infarct Parenchymal volume is normal for age. The ventricles are normal in size. Gray-white differentiation is preserved. Patchy hypodensity in the supratentorial white matter likely reflects sequela of chronic small-vessel ischemic change. The pituitary and suprasellar region are normal. There is no mass lesion. There is no mass effect or midline shift. Vascular: There is calcification of the bilateral  carotid siphons. Skull: Normal. Negative for fracture or focal lesion. Sinuses/Orbits: The paranasal sinuses are clear. Bilateral lens implants are in place. The globes and orbits are otherwise unremarkable. Other: None. CT CERVICAL SPINE FINDINGS Alignment: Normal. There is no jumped or perched facet or other evidence of traumatic malalignment Skull base and vertebrae: Skull base alignment is maintained. Vertebral body heights are preserved. There is no evidence of acute fracture. There is no suspicious osseous lesion. Soft tissues and spinal canal: No prevertebral fluid or swelling. No visible canal hematoma. Disc levels: There is disc space narrowing and degenerative endplate change most advanced at C5-C6 with prominent anterior osteophytes throughout the remainder of the cervical spine. There is multilevel facet arthropathy most advanced on the left at C3-C4 through C5-C6. There is no evidence of high-grade spinal canal stenosis. Upper chest: There is right worse than left apical scarring. Other: None. IMPRESSION: 1. No acute intracranial pathology. 2. No acute fracture or traumatic malalignment of the cervical spine. Electronically Signed   By: Valetta Mole M.D.   On: 09/07/2022 12:47   CT Cervical Spine Wo Contrast  Result Date: 09/07/2022 CLINICAL DATA:  Fall EXAM: CT HEAD WITHOUT CONTRAST CT CERVICAL SPINE WITHOUT CONTRAST TECHNIQUE: Multidetector CT imaging of the head and cervical spine was performed following the standard protocol without intravenous contrast. Multiplanar CT image reconstructions of the cervical spine were also generated. RADIATION DOSE REDUCTION: This exam was performed according to the departmental dose-optimization program which includes automated exposure control, adjustment of the mA and/or kV according to patient size and/or use of iterative reconstruction technique. COMPARISON:  CT head and cervical spine 03/08/2018 FINDINGS: CT HEAD FINDINGS Brain: There is no acute  intracranial hemorrhage, extra-axial fluid collection, or acute infarct Parenchymal volume is normal for age. The ventricles are normal in size. Gray-white differentiation is preserved. Patchy hypodensity in the supratentorial white matter likely reflects sequela of chronic small-vessel ischemic change. The pituitary and suprasellar region are normal. There is no mass lesion. There is no mass effect or midline shift. Vascular: There is calcification of the bilateral carotid siphons. Skull: Normal. Negative for fracture or focal lesion.  Sinuses/Orbits: The paranasal sinuses are clear. Bilateral lens implants are in place. The globes and orbits are otherwise unremarkable. Other: None. CT CERVICAL SPINE FINDINGS Alignment: Normal. There is no jumped or perched facet or other evidence of traumatic malalignment Skull base and vertebrae: Skull base alignment is maintained. Vertebral body heights are preserved. There is no evidence of acute fracture. There is no suspicious osseous lesion. Soft tissues and spinal canal: No prevertebral fluid or swelling. No visible canal hematoma. Disc levels: There is disc space narrowing and degenerative endplate change most advanced at C5-C6 with prominent anterior osteophytes throughout the remainder of the cervical spine. There is multilevel facet arthropathy most advanced on the left at C3-C4 through C5-C6. There is no evidence of high-grade spinal canal stenosis. Upper chest: There is right worse than left apical scarring. Other: None. IMPRESSION: 1. No acute intracranial pathology. 2. No acute fracture or traumatic malalignment of the cervical spine. Electronically Signed   By: Valetta Mole M.D.   On: 09/07/2022 12:47   DG Ankle Complete Left  Result Date: 09/07/2022 CLINICAL DATA:  Trauma, fall EXAM: LEFT ANKLE COMPLETE - 3+ VIEW COMPARISON:  None Available. FINDINGS: There is bimalleolar fracture dislocation. Comminuted displaced fractures are seen in the base of medial  malleolus and the distal fibula. There is lateral displacement of talus in relation to distal tibia. There is abnormal widening of joint space between the distal tibia and talus in the anterior aspect. IMPRESSION: Comminuted displaced fractures are seen in distal tibia and fibula. There is lateral dislocation in the left ankle. Electronically Signed   By: Elmer Picker M.D.   On: 09/07/2022 11:40   DG Pelvis Portable  Result Date: 09/07/2022 CLINICAL DATA:  Trauma, fall EXAM: PORTABLE PELVIS 1-2 VIEWS COMPARISON:  None Available. FINDINGS: No displaced fracture or dislocation is seen. Degenerative changes are noted in lumbar spine. There is previous surgical fusion at the L4-L5 level. IMPRESSION: No fracture or dislocation is seen in pelvis. Electronically Signed   By: Elmer Picker M.D.   On: 09/07/2022 11:38   DG Chest Port 1 View  Result Date: 09/07/2022 CLINICAL DATA:  Status post fall. EXAM: PORTABLE CHEST 1 VIEW COMPARISON:  Radiographs 03/10/2022 and 02/06/2021. FINDINGS: 1122 hours. The heart size and mediastinal contours are stable. The lungs appear unchanged with mild chronic interstitial prominence. No edema, confluent airspace opacity, pleural effusion or pneumothorax. No acute fractures are identified. There are asymmetric glenohumeral degenerative changes on the left and mild thoracic spondylosis. Telemetry leads overlie the chest. IMPRESSION: No evidence of acute chest injury. Stable mild chronic interstitial prominence. Electronically Signed   By: Richardean Sale M.D.   On: 09/07/2022 11:38    Procedures .Sedation  Date/Time: 09/07/2022 3:38 PM  Performed by: [name], DO Authorized by: [name], DO   Consent:    Consent obtained:  Verbal   Consent given by:  Patient   Risks discussed:  Allergic reaction, dysrhythmia, inadequate sedation, nausea, prolonged hypoxia resulting in organ damage, prolonged sedation necessitating reversal, respiratory  compromise necessitating ventilatory assistance and intubation and vomiting   Alternatives discussed:  Analgesia without sedation Universal protocol:    Immediately prior to procedure, a time out was called: yes     Patient identity confirmed:  Verbally with patient Indications:    Procedure performed:  Dislocation reduction   Procedure necessitating sedation performed by:  Different physician Pre-sedation assessment:    Time since last food or drink:  6   ASA classification: class  2 - patient with mild systemic disease     Mouth opening:  3 or more finger widths   Thyromental distance:  4 finger widths   Mallampati score:  II - soft palate, uvula, fauces visible   Neck mobility: normal     Pre-sedation assessments completed and reviewed: pre-procedure airway patency not reviewed, pre-procedure cardiovascular function not reviewed, pre-procedure hydration status not reviewed, pre-procedure mental status not reviewed, pre-procedure nausea and vomiting status not reviewed, pre-procedure pain level not reviewed, pre-procedure respiratory function not reviewed and pre-procedure temperature not reviewed   Immediate pre-procedure details:    Reviewed: vital signs, relevant labs/tests and NPO status     Verified: bag valve mask available, emergency equipment available, intubation equipment available, IV patency confirmed, oxygen available, reversal medications available and suction available   Procedure details (see MAR for exact dosages):    Preoxygenation:  Nasal cannula   Sedation:  Etomidate   Intended level of sedation: deep   Analgesia:  Fentanyl   Intra-procedure monitoring:  Blood pressure monitoring, cardiac monitor, continuous pulse oximetry, continuous capnometry and frequent vital sign checks   Intra-procedure events: none     Total Provider sedation time (minutes):  15 Post-procedure details:    Attendance: Constant attendance by certified staff until patient recovered      Recovery: Patient returned to pre-procedure baseline     Post-sedation assessments completed and reviewed: post-procedure airway patency not reviewed, post-procedure cardiovascular function not reviewed, post-procedure hydration status not reviewed, post-procedure mental status not reviewed, post-procedure nausea and vomiting status not reviewed, pain score not reviewed, post-procedure respiratory function not reviewed and post-procedure temperature not reviewed     Patient is stable for discharge or admission: yes     Procedure completion:  Tolerated well, no immediate complications     Medications Ordered in ED Medications  etomidate (AMIDATE) injection 10.8 mg (10.8 mg Intravenous Given 09/07/22 1520)  fentaNYL (SUBLIMAZE) injection 50 mcg (50 mcg Intravenous Given 09/07/22 1523)    ED Course/ Medical Decision Making/ A&P Clinical Course as of 09/07/22 1553  Thu Sep 07, 2022  1542 Patient's x-ray did show a bimalleolar fracture with dislocation of the ankle.  Orthopedics was consulted that assisted in reduction and splinting and they recommended outpatient follow-up in 1 week.  Please see my procedure note for sedation for the procedure.  Patient in no other traumatic injuries on exam.  Patient's daughter requested ultrasound of right lower extremity with her redness and swelling to rule out DVT due to her recent fractures and hospitalizations.  The patient will be unable to transfer with her right toe fractures and left ankle fracture and will require social work eval for SNF. [VK]  1551 Patient signed out to [name] pending DVT study with plan for likely admission for PT eval and pain control. [VK]    Clinical Course User Index [VK] [name], DO                             Medical Decision Making This patient presents to the ED with chief complaint(s) of fall with pertinent past medical history of recent lumbar surgery, currently wheelchair bound which further complicates  the presenting complaint. The complaint involves an extensive differential diagnosis and also carries with it a high risk of complications and morbidity.    The differential diagnosis includes ICH, mass effect, cervical spine fracture, L ankle fracture or dislocation or sprain, no other traumatic injuries  seen on exam, cellulitis of R shin, DVT, no evidence of sepsis, no pre-syncopal symptoms making syncopal fall unlikely    Additional history obtained: Additional history obtained from family and EMS  Records reviewed Primary Care Documents  ED Course and Reassessment: On patient's arrival to the emergency department she was made a prehospital level level 2 trauma due to her elderly age and fall on thinners.  Primary survey was intact.  Secondary survey was significant for swelling and deformity of the left ankle.  She was neurovascularly intact.  She will have CT head and C-spine performed in addition to chest x-ray and left ankle x-ray.  Patient's pain is currently well-controlled and she will be closely reassessed.  Independent labs interpretation:  N/A  Independent visualization of imaging: - I independently visualized the following imaging with scope of interpretation limited to determining acute life threatening conditions related to emergency care: CTH/C-spine, CXR, L ankle XR, which revealed bimalleolar fracture of L ankle with dislocation, otherwise no acute traumatic injury  Consultation: - Consulted or discussed management/test interpretation w/ external professional: orthopedics, SW     Amount and/or Complexity of Data Reviewed Labs: ordered. Radiology: ordered.  Risk Prescription drug management.           Final Clinical Impression(s) / ED Diagnoses Final diagnoses:  Fall, initial encounter  Closed bimalleolar fracture of left ankle, initial encounter  Cellulitis of right lower extremity    Rx / DC Orders ED Discharge Orders          Ordered    Non weight  bearing        09/07/22 [place], [place], DO 09/07/22 1553

## 2022-09-07 NOTE — Assessment & Plan Note (Signed)
-  chronic avoid nephrotoxic medications such as NSAIDs, Vanco Zosyn combo,  avoid hypotension, continue to follow renal function  

## 2022-09-07 NOTE — Discharge Instructions (Addendum)
Orthopaedic Trauma Service Discharge Instructions   General Discharge Instructions  Orthopaedic Injuries:  Left ankle fracture dislocation treated with open reduction and internal fixation using plate and screws  WEIGHT BEARING STATUS: Nonweightbearing left leg   Bone health: vitamin d levels look good   Review the following resource for additional information regarding bone health  [email or web address]  Wound Care: do not remove splint, do not get splint wet.  We will remove at your first post operative appointment    Diet: as you were eating previously.  Can use over the counter stool softeners and bowel preparations, such as Miralax, to help with bowel movements.  Narcotics can be constipating.  Be sure to drink plenty of fluids  PAIN MEDICATION USE AND EXPECTATIONS  You have likely been given narcotic medications to help control your pain.  After a traumatic event that results in an fracture (broken bone) with or without surgery, it is ok to use narcotic pain medications to help control one's pain.  We understand that everyone responds to pain differently and each individual patient will be evaluated on a regular basis for the continued need for narcotic medications. Ideally, narcotic medication use should last no more than 6-8 weeks (coinciding with fracture healing).   As a patient it is your responsibility as well to monitor narcotic medication use and report the amount and frequency you use these medications when you come to your office visit.   We would also advise that if you are using narcotic medications, you should take a dose prior to therapy to maximize you participation.  IF YOU ARE ON NARCOTIC MEDICATIONS IT IS NOT PERMISSIBLE TO OPERATE A MOTOR VEHICLE (MOTORCYCLE/CAR/TRUCK/MOPED) OR HEAVY MACHINERY DO NOT MIX NARCOTICS WITH OTHER CNS (CENTRAL NERVOUS SYSTEM) DEPRESSANTS SUCH AS ALCOHOL   POST-OPERATIVE OPIOID TAPER INSTRUCTIONS: It is  important to wean off of your opioid medication as soon as possible. If you do not need pain medication after your surgery it is ok to stop day one. Opioids include: Codeine, Hydrocodone(Norco, Vicodin), Oxycodone(Percocet, oxycontin) and hydromorphone amongst others.  Long term and even short term use of opiods can cause: Increased pain response Dependence Constipation Depression Respiratory depression And more.  Withdrawal symptoms can include Flu like symptoms Nausea, vomiting And more Techniques to manage these symptoms Hydrate well Eat regular healthy meals Stay active Use relaxation techniques(deep breathing, meditating, yoga) Do Not substitute Alcohol to help with tapering If you have been on opioids for less than two weeks and do not have pain than it is ok to stop all together.  Plan to wean off of opioids This plan should start within one week post op of your fracture surgery  Maintain the same interval or time between taking each dose and first decrease the dose.  Cut the total daily intake of opioids by one tablet each day Next start to increase the time between doses. The last dose that should be eliminated is the evening dose.    STOP SMOKING OR USING NICOTINE PRODUCTS!!!!  As discussed nicotine severely impairs your body's ability to heal surgical and traumatic wounds but also impairs bone healing.  Wounds and bone heal by forming microscopic blood vessels (angiogenesis) and nicotine is a vasoconstrictor (essentially, shrinks blood vessels).  Therefore, if vasoconstriction occurs to these microscopic blood vessels they essentially disappear and are unable to deliver necessary nutrients to the healing tissue.  This is one modifiable factor that you can do to dramatically increase your chances of healing your injury.    (  This means no smoking, no nicotine gum, patches, etc)  DO NOT USE NONSTEROIDAL ANTI-INFLAMMATORY DRUGS (NSAID'S)  Using products such as Advil  (ibuprofen), Aleve (naproxen), Motrin (ibuprofen) for additional pain control during fracture healing can delay and/or prevent the healing response.  If you would like to take over the counter (OTC) medication, Tylenol (acetaminophen) is ok.  However, some narcotic medications that are given for pain control contain acetaminophen as well. Therefore, you should not exceed more than 4000 mg of tylenol in a day if you do not have liver disease.  Also note that there are may OTC medicines, such as cold medicines and allergy medicines that my contain tylenol as well.  If you have any questions about medications and/or interactions please ask your doctor/PA or your pharmacist.      ICE AND ELEVATE INJURED/OPERATIVE EXTREMITY  Using ice and elevating the injured extremity above your heart can help with swelling and pain control.  Icing in a pulsatile fashion, such as 20 minutes on and 20 minutes off, can be followed.    Do not place ice directly on skin. Make sure there is a barrier between to skin and the ice pack.    Using frozen items such as frozen peas works well as the conform nicely to the are that needs to be iced.  USE AN ACE WRAP OR TED HOSE FOR SWELLING CONTROL  In addition to icing and elevation, Ace wraps or TED hose are used to help limit and resolve swelling.  It is recommended to use Ace wraps or TED hose until you are informed to stop.    When using Ace Wraps start the wrapping distally (farthest away from the body) and wrap proximally (closer to the body)   Example: If you had surgery on your leg or thing and you do not have a splint on, start the ace wrap at the toes and work your way up to the thigh        If you had surgery on your upper extremity and do not have a splint on, start the ace wrap at your fingers and work your way up to the upper arm  IF YOU ARE IN A SPLINT OR CAST DO NOT [place]   If your splint gets wet for any reason please contact the office  immediately. You may shower in your splint or cast as long as you keep it dry.  This can be done by wrapping in a cast cover or garbage back (or similar)  Do Not stick any thing down your splint or cast such as pencils, money, or hangers to try and scratch yourself with.  If you feel itchy take benadryl as prescribed on the bottle for itching  IF YOU ARE IN A CAM BOOT (BLACK BOOT)  You may remove boot periodically. Perform daily dressing changes as noted below.  Wash the liner of the boot regularly and wear a sock when wearing the boot. It is recommended that you sleep in the boot until told otherwise    Call office for the following: Temperature greater than 101F Persistent nausea and vomiting Severe uncontrolled pain Redness, tenderness, or signs of infection (pain, swelling, redness, odor or green/yellow discharge around the site) Difficulty breathing, headache or visual disturbances Hives Persistent dizziness or light-headedness Extreme fatigue Any other questions or concerns you may have after discharge  In an emergency, call 911 or go to an Emergency Department at a nearby hospital  HELPFUL INFORMATION  If you had a block, it will wear off between 8-24 hrs postop typically.  This is period when your pain may go from nearly zero to the pain you would have had postop without the block.  This is an abrupt transition but nothing dangerous is happening.  You may take an extra dose of narcotic when this happens.  You should wean off your narcotic medicines as soon as you are able.  Most patients will be off or using minimal narcotics before their first postop appointment.   We suggest you use the pain medication the first night prior to going to bed, in order to ease any pain when the anesthesia wears off. You should avoid taking pain medications on an empty stomach as it will make you nauseous.  Do not drink alcoholic beverages or take illicit drugs when taking pain medications.  In  most states it is against the law to drive while you are in a splint or sling.  And certainly against the law to drive while taking narcotics.  You may return to work/school in the next couple of days when you feel up to it.   Pain medication may make you constipated.  Below are a few solutions to try in this order: Decrease the amount of pain medication if you aren't having pain. Drink lots of decaffeinated fluids. Drink prune juice and/or each dried prunes  If the first 3 don't work start with additional solutions Take Colace - an over-the-counter stool softener Take Senokot - an over-the-counter laxative Take Miralax - a stronger over-the-counter laxative     CALL THE OFFICE WITH ANY QUESTIONS OR CONCERNS: [phone]   VISIT OUR WEBSITE FOR ADDITIONAL INFORMATION: [email or web address]

## 2022-09-07 NOTE — Progress Notes (Signed)
Orthopedic Tech Progress Note Patient Details:  Teresa Daniels 09/27/1940 [id number]  Level 2 trauma  Patient ID: Teresa Daniels, female   DOB: [birth year], 82 y.o.   MRN: [id number]  [name] 09/07/2022, 12:07 PM

## 2022-09-07 NOTE — Progress Notes (Addendum)
Orthopaedic Trauma Service Procedure Note    Orthopaedic Trauma Service Procedure   Dx: closed Left bimalleolar ankle fracture dislocation   Clinician: Molinda Bailiff, PA-C  Complications: none  Anesthesia: etomidate and fentanyl by EDP (Dr. Sula Rumple)   Procedure   82 y/o female s/p ground level fall . Patient seen and evaluated in the ED and found to have a Left bimalleolar ankle fracture dislocation. After adequate analgesia was achieved patient was brought to the left edge of the bed. Knee was brought up to about 60 degrees of flexion. Very gentle longitudinal traction was applied to the ankle. A subtle clunk and resolution her deformity was noted.  A posterior and stirrup splint was applied. Then a good mold was applied to the splint with a posterior lateral force directed over the distal tibia and an anteromedial force was directed over the calcaneus and distal fibula to help maintain reduction. Patient tolerated this well. Post-splinting x-rays will be obtained. Patient had significant pain relief at the conclusion of the procedure. Teresa Daniels was used throughout to maintain reduction as well. Pt will follow up in 1 week in office to discuss ORIF.  Would anticipate ORIF in 7-10 as her swelling was quite minimal in the ED, although this could change over time.   NWB L leg Aggressive Ice and elevation for swelling and pain control    Jari Pigg, PA-C [phone] (C) 09/07/2022, 3:37 PM  [facility] [place] [address] [phone] (F)   Patient ID: Teresa Daniels, female   DOB: 18-Nov-1940, 82 y.o.   MRN: [id number]

## 2022-09-07 NOTE — Assessment & Plan Note (Signed)
Continue Plavix 75 mg a day Patient has mild residual right-sided deficits

## 2022-09-07 NOTE — Assessment & Plan Note (Signed)
Orthopedics aware appreciate their consult.  Nonsurgical to start supportive management pain control PT OT as needed most likely will need placement

## 2022-09-07 NOTE — ED Triage Notes (Signed)
Pt present to ED from home d/t fall. Per EMS, pt was transferring self from wheelchair to recliner when she fell. Daughter present when pt fell. Pt s/p back surgery in January, discharged to home on Sunday. Pt positive for blood thinners. Pt high risk for fall(three falls in three days). Boot noted to right foot s/p previous fall. Insulated boot noted to left leg, pt c/o pain to back of ankle.

## 2022-09-07 NOTE — Assessment & Plan Note (Signed)
Order sliding scale and diabetic diet check hemoglobin A1c

## 2022-09-07 NOTE — Progress Notes (Signed)
Orthopedic Tech Progress Note Patient Details:  Teresa Daniels 05-27-1941 [id number]  Well-padded plaster short leg splint with stirrup applied to LLE following conscious sedation and reduction of L ankle by Ainsley Spinner, PA-C.   Ortho Devices Type of Ortho Device: Post (short leg) splint, Stirrup splint Ortho Device/Splint Location: LLE Ortho Device/Splint Interventions: Ordered, Application, Adjustment   Post Interventions Patient Tolerated: Well Instructions Provided: Care of device  [name] [name] 09/07/2022, 6:38 PM

## 2022-09-07 NOTE — Assessment & Plan Note (Signed)
Status postlaminectomy currently wheelchair-bound likely need placement

## 2022-09-07 NOTE — Assessment & Plan Note (Addendum)
-  admit per  cellulitis protocol will     continue current antibiotic choice patient has been taking Augmentin as an outpatient will change to Rocephin while hospitalized      plain films showed:   no evidence of air  no evidence of osteomyelitis   no               foreign   objects       Will obtain MRSA screening,       obtain blood cultures  if febrile or septic     further antibiotic adjustment pending above results

## 2022-09-08 ENCOUNTER — Encounter (HOSPITAL_COMMUNITY): Payer: Self-pay | Admitting: Internal Medicine

## 2022-09-08 DIAGNOSIS — S82892A Other fracture of left lower leg, initial encounter for closed fracture: Secondary | ICD-10-CM | POA: Diagnosis not present

## 2022-09-08 LAB — COMPREHENSIVE METABOLIC PANEL
ALT: 11 U/L (ref 0–44)
AST: 14 U/L — ABNORMAL LOW (ref 15–41)
Albumin: 2.8 g/dL — ABNORMAL LOW (ref 3.5–5.0)
Alkaline Phosphatase: 119 U/L (ref 38–126)
Anion gap: 9 (ref 5–15)
BUN: 16 mg/dL (ref 8–23)
CO2: 26 mmol/L (ref 22–32)
Calcium: 8.3 mg/dL — ABNORMAL LOW (ref 8.9–10.3)
Chloride: 102 mmol/L (ref 98–111)
Creatinine, Ser: 1.12 mg/dL — ABNORMAL HIGH (ref 0.44–1.00)
GFR, Estimated: 49 mL/min — ABNORMAL LOW (ref >60)
Glucose, Bld: 126 mg/dL — ABNORMAL HIGH (ref 70–99)
Potassium: 3.9 mmol/L (ref 3.5–5.1)
Sodium: 137 mmol/L (ref 135–145)
Total Bilirubin: 0.4 mg/dL (ref 0.3–1.2)
Total Protein: 5.2 g/dL — ABNORMAL LOW (ref 6.5–8.1)

## 2022-09-08 LAB — OSMOLALITY, URINE: Osmolality, Ur: 501 mOsm/kg (ref 300–900)

## 2022-09-08 LAB — CBC
HCT: 24.3 % — ABNORMAL LOW (ref 36.0–46.0)
Hemoglobin: 8.4 g/dL — ABNORMAL LOW (ref 12.0–15.0)
MCH: 33.7 pg (ref 26.0–34.0)
MCHC: 34.6 g/dL (ref 30.0–36.0)
MCV: 97.6 fL (ref 80.0–100.0)
Platelets: 193 10*3/uL (ref 150–400)
RBC: 2.49 MIL/uL — ABNORMAL LOW (ref 3.87–5.11)
RDW: 13 % (ref 11.5–15.5)
WBC: 5.7 10*3/uL (ref 4.0–10.5)
nRBC: 0 % (ref 0.0–0.2)

## 2022-09-08 LAB — GLUCOSE, CAPILLARY
Glucose-Capillary: 119 mg/dL — ABNORMAL HIGH (ref 70–99)
Glucose-Capillary: 135 mg/dL — ABNORMAL HIGH (ref 70–99)
Glucose-Capillary: 135 mg/dL — ABNORMAL HIGH (ref 70–99)
Glucose-Capillary: 188 mg/dL — ABNORMAL HIGH (ref 70–99)
Glucose-Capillary: 173 mg/dL — ABNORMAL HIGH (ref 70–99)

## 2022-09-08 LAB — URINALYSIS, COMPLETE (UACMP) WITH MICROSCOPIC
Bacteria, UA: NONE SEEN
Bilirubin Urine: NEGATIVE
Glucose, UA: NEGATIVE mg/dL
Hgb urine dipstick: NEGATIVE
Ketones, ur: NEGATIVE mg/dL
Leukocytes,Ua: NEGATIVE
Nitrite: NEGATIVE
Protein, ur: NEGATIVE mg/dL
Specific Gravity, Urine: 1.015 (ref 1.005–1.030)
pH: 8 (ref 5.0–8.0)

## 2022-09-08 LAB — CREATININE, URINE, RANDOM: Creatinine, Urine: 56 mg/dL

## 2022-09-08 LAB — SODIUM, URINE, RANDOM: Sodium, Ur: 151 mmol/L

## 2022-09-08 LAB — VITAMIN D 25 HYDROXY (VIT D DEFICIENCY, FRACTURES): Vit D, 25-Hydroxy: 37.98 ng/mL (ref 30–100)

## 2022-09-08 LAB — PREALBUMIN: Prealbumin: 14 mg/dL — ABNORMAL LOW (ref 18–38)

## 2022-09-08 MED ORDER — ENSURE MAX PROTEIN PO LIQD
11.0000 [oz_av] | Freq: Two times a day (BID) | ORAL | Status: DC
  Administered 2022-09-08 – 2022-09-15 (×13): 11 [oz_av] via ORAL
  Filled 2022-09-08 (×16): qty 330

## 2022-09-08 MED ORDER — CEPHALEXIN 500 MG PO CAPS
500.0000 mg | ORAL_CAPSULE | Freq: Three times a day (TID) | ORAL | Status: DC
  Administered 2022-09-08 – 2022-09-12 (×12): 500 mg via ORAL
  Filled 2022-09-08 (×12): qty 1

## 2022-09-08 MED ORDER — ADULT MULTIVITAMIN W/MINERALS CH
1.0000 | ORAL_TABLET | Freq: Every day | ORAL | Status: DC
  Administered 2022-09-08 – 2022-09-15 (×8): 1 via ORAL
  Filled 2022-09-08 (×8): qty 1

## 2022-09-08 MED ORDER — MUPIROCIN 2 % EX OINT
TOPICAL_OINTMENT | Freq: Two times a day (BID) | CUTANEOUS | Status: DC
  Administered 2022-09-08 – 2022-09-14 (×3): 1 via TOPICAL
  Filled 2022-09-08: qty 22

## 2022-09-08 MED ORDER — POTASSIUM CHLORIDE 10 MEQ/100ML IV SOLN
10.0000 meq | INTRAVENOUS | Status: AC
  Administered 2022-09-08 (×2): 10 meq via INTRAVENOUS
  Filled 2022-09-08 (×2): qty 100

## 2022-09-08 NOTE — TOC Initial Note (Signed)
Transition of Care [facility]) - Initial/Assessment Note    Patient Details  Name: Teresa Daniels MRN: [id number] Date of Birth: [birth year]  [facility] CM/SW Contact:    Coralee Pesa, [place] Phone Number: 09/08/2022, 3:58 PM  Clinical Narrative:                 CSW met with pt at bedside to discuss DC plans. CSW advised of rec for SNF. Pt notes that she has been to [name] before, but does not want to go to SNF again. She confirms she is from [place]. She says they can get care in the home and will use the [facility]. Pt states CSW can call her daughter to work out a Secretary/administrator. CSW spoke with Kellie Shropshire who states that pt only discharged a week ago from [name]. Dtr states she needs to return for more rehab. She would like for pt to go back to [facility] will come to hospital and speak with pt and CSW to confirm plan. [facility] confirms pt has used 34 days, but should have met their out of pocket cost.  After discussion with dtr, pt is on board to return to [facility] will have a bed Monday. Possible ORIF on Tuesday. Will start authorization when appropriate. TOC will continue to follow.   Expected Discharge Plan: Skilled Nursing Facility Barriers to Discharge: Continued Medical Work up, Ship broker, SNF Pending bed offer   Patient Goals and CMS Choice Patient states their goals for this hospitalization and ongoing recovery are:: Pt wants to be able to return home. CMS Medicare.gov Compare Post Acute Care list provided to:: Patient Choice offered to / list presented to : Patient, Adult [place] ownership interest in [facility].provided to:: Patient    Expected Discharge Plan and [place] Choice: [place] Living arrangements for the past 2 months: [place]                                      Prior Living Arrangements/Services Living arrangements for the  past 2 months: [place] Lives with:: Facility Resident Patient language and need for interpreter reviewed:: Yes Do you feel safe going back to the place where you live?: Yes      Need for Family Participation in Patient Care: Yes (Comment) Care giver support system in place?: Yes (comment) Current home services: Home OT, Home PT Criminal Activity/Legal Involvement Pertinent to Current Situation/Hospitalization: No - Comment as needed  Activities of Daily Living Home Assistive Devices/Equipment: None ADL Screening (condition at time of admission) Patient's cognitive ability adequate to safely complete daily activities?: Yes Is the patient deaf or have difficulty hearing?: Yes Does the patient have difficulty seeing, even when wearing glasses/contacts?: No Does the patient have difficulty concentrating, remembering, or making decisions?: No Patient able to express need for assistance with ADLs?: Yes Does the patient have difficulty dressing or bathing?: Yes Independently performs ADLs?: No Communication: Needs assistance Is this a change from baseline?: Pre-admission baseline Dressing (OT): Needs assistance Is this a change from baseline?: Pre-admission baseline Grooming: Needs assistance Is this a change from baseline?: Pre-admission baseline Feeding: Needs assistance Is this a change from baseline?: Pre-admission baseline Bathing: Needs assistance Is this a change from baseline?: Pre-admission baseline Toileting: Needs assistance Is this a change from baseline?: Pre-admission baseline In/Out Bed: Needs  assistance Is this a change from baseline?: Pre-admission baseline Walks in Home: Needs assistance Is this a change from baseline?: Pre-admission baseline Does the patient have difficulty walking or climbing stairs?: Yes Weakness of Legs: Both Weakness of Arms/Hands: Both  Permission Sought/Granted Permission sought to share information with : Family Supports,  Chartered certified accountant granted to share information with : Yes, Verbal Permission Granted  Share Information with NAME: Kellie Shropshire  Permission granted to share info w AGENCY: [facility]  Permission granted to share info w Relationship: Dtr     Emotional Assessment Appearance:: Appears stated age Attitude/Demeanor/Rapport: Engaged Affect (typically observed): Appropriate Orientation: : Oriented to Self, Oriented to Place, Oriented to  Time, Oriented to Situation Alcohol / Substance Use: Not Applicable Psych Involvement: No (comment)  Admission diagnosis:  Ankle fracture [S82.899A] Fall, initial encounter [W19.XXXA] Closed bimalleolar fracture of left ankle, initial encounter [id number] Patient Active Problem List   Diagnosis Date Noted   Ankle fracture 09/07/2022   Hypokalemia 09/07/2022   Cellulitis in diabetic foot ([place]) 09/07/2022   Chronic combined systolic and diastolic CHF (congestive heart failure) ([place]) 09/07/2022   Asthma, chronic 09/07/2022   DM2 (diabetes mellitus, type 2) ([place]) 09/07/2022   Spondylolisthesis of lumbar region 07/25/2022   Lumbar compression fracture ([place]) 04/06/2022   Epidural hematoma ([place]) 03/14/2022   Post-op pain 03/11/2022   Lumbar stenosis with neurogenic claudication 03/09/2022   CAP (community acquired pneumonia) 02/06/2021   CKD (chronic kidney disease) stage 4, GFR 15-29 ml/min (HCC) 02/06/2021   Generalized weakness 02/06/2021   Fracture dislocation of right ankle 02/06/2021   COVID-19 08/08/2019   Acute respiratory failure with hypoxemia ([place]) 08/08/2019   History of CVA (cerebrovascular accident) 08/08/2019   Anemia 08/08/2019   CKD (chronic kidney disease), stage III ([place]) 08/08/2019   Dehydration    Vitamin B12 deficiency 02/10/2019   Unsteady gait 02/10/2019   History of stroke 12/06/2017   Diabetic polyneuropathy associated with type 2 diabetes mellitus ([place]) 12/06/2017   PCP:  [name], PA-C Pharmacy:    [facility] [id number] - [place], [place] [address] [place] [address] Phone: [phone] [phone]     Social Determinants of Health (SDOH) Social History: SDOH Screenings   Food Insecurity: No Food Insecurity (07/25/2022)  Housing: Low Risk  (07/25/2022)  Transportation Needs: No Transportation Needs (07/25/2022)  Utilities: Not At Risk (07/25/2022)  Tobacco Use: Medium Risk (09/08/2022)   SDOH Interventions:     Readmission Risk Interventions     No data to display

## 2022-09-08 NOTE — Progress Notes (Signed)
[facility]thopaedic Trauma Service Progress Note  Patient ID: LAKIERA THORNBRUGH MRN: [id number] DOB/AGE: [birth year] 82 y.o.  Subjective:  Doing ok this am  Mild pain in L ankle  No other complaints    ROS As above  Objective:   VITALS:   Vitals:   09/07/22 2110 09/08/22 0115 09/08/22 0430 09/08/22 0808  BP: 111/78 136/62 125/62 (!) 113/56  Pulse: 90 77 89 83  Resp:  '18 20 16  '$ Temp: 99.6 F (37.6 C) 98.5 F (36.9 C) 99.7 F (37.6 C) 98.2 F (36.8 C)  TempSrc: Oral Oral Oral Oral  SpO2: 100% 96% 93% 97%  Weight:      Height:        Estimated body mass index is 26.41 kg/m as calculated from the following:   Height as of this encounter: '5\' 5"'$  (1.651 m).   Weight as of this encounter: 72 kg.   Intake/Output      02/29 0701 03/01 0700 03/01 0701 03/02 0700   P.O. 240    Total Intake(mL/kg) 240 (3.3)    Urine (mL/kg/hr) 2    Total Output 2    Net +238           LABS  Results for orders placed or performed during the hospital encounter of 09/07/22 (from the past 24 hour(s))  Basic metabolic panel     Status: Abnormal   Collection Time: 09/07/22  5:18 PM  Result Value Ref Range   Sodium 139 135 - 145 mmol/L   Potassium 3.2 (L) 3.5 - 5.1 mmol/L   Chloride 101 98 - 111 mmol/L   CO2 27 22 - 32 mmol/L   Glucose, Bld 133 (H) 70 - 99 mg/dL   BUN 20 8 - 23 mg/dL   Creatinine, Ser 1.28 (H) 0.44 - 1.00 mg/dL   Calcium 9.0 8.9 - 10.3 mg/dL   GFR, Estimated 42 (L) >60 mL/min   Anion gap 11 5 - 15  CBC with Differential     Status: Abnormal   Collection Time: 09/07/22  5:18 PM  Result Value Ref Range   WBC 6.8 4.0 - 10.5 K/uL   RBC 2.96 (L) 3.87 - 5.11 MIL/uL   Hemoglobin 9.8 (L) 12.0 - 15.0 g/dL   HCT 29.9 (L) 36.0 - 46.0 %   MCV 101.0 (H) 80.0 - 100.0 fL   MCH 33.1 26.0 - 34.0 pg   MCHC 32.8 30.0 - 36.0 g/dL   RDW 13.0 11.5 - 15.5 %   Platelets 224 150 - 400 K/uL   nRBC 0.0 0.0 - 0.2 %    Neutrophils Relative % 73 %   Neutro Abs 4.9 1.7 - 7.7 K/uL   Lymphocytes Relative 15 %   Lymphs Abs 1.0 0.7 - 4.0 K/uL   Monocytes Relative 7 %   Monocytes Absolute 0.5 0.1 - 1.0 K/uL   Eosinophils Relative 4 %   Eosinophils Absolute 0.3 0.0 - 0.5 K/uL   Basophils Relative 1 %   Basophils Absolute 0.0 0.0 - 0.1 K/uL   Immature Granulocytes 0 %   Abs Immature Granulocytes 0.02 0.00 - 0.07 K/uL  Magnesium     Status: None   Collection Time: 09/07/22  5:26 PM  Result Value Ref Range   Magnesium 1.9 1.7 - 2.4 mg/dL  CK  Status: None   Collection Time: 09/07/22  8:08 PM  Result Value Ref Range   Total CK 137 38 - 234 U/L  Osmolality     Status: Abnormal   Collection Time: 09/07/22  8:08 PM  Result Value Ref Range   Osmolality 300 (H) 275 - 295 mOsm/kg  Phosphorus     Status: None   Collection Time: 09/07/22  8:08 PM  Result Value Ref Range   Phosphorus 3.0 2.5 - 4.6 mg/dL  Vitamin B12     Status: Abnormal   Collection Time: 09/07/22  8:08 PM  Result Value Ref Range   Vitamin B-12 1,312 (H) 180 - 914 pg/mL  Folate     Status: None   Collection Time: 09/07/22  8:08 PM  Result Value Ref Range   Folate 10.9 >5.9 ng/mL  Iron and TIBC     Status: Abnormal   Collection Time: 09/07/22  8:08 PM  Result Value Ref Range   Iron 51 28 - 170 ug/dL   TIBC 204 (L) 250 - 450 ug/dL   Saturation Ratios 25 10.4 - 31.8 %   UIBC 153 ug/dL  Ferritin     Status: Abnormal   Collection Time: 09/07/22  8:08 PM  Result Value Ref Range   Ferritin 394 (H) 11 - 307 ng/mL  Reticulocytes     Status: Abnormal   Collection Time: 09/07/22  8:08 PM  Result Value Ref Range   Retic Ct Pct 2.2 0.4 - 3.1 %   RBC. 3.18 (L) 3.87 - 5.11 MIL/uL   Retic Count, Absolute 70.0 19.0 - 186.0 K/uL   Immature Retic Fract 17.5 (H) 2.3 - 15.9 %  TSH     Status: None   Collection Time: 09/07/22  8:08 PM  Result Value Ref Range   TSH 1.962 0.350 - 4.500 uIU/mL  Glucose, capillary     Status: Abnormal   Collection  Time: 09/07/22 11:15 PM  Result Value Ref Range   Glucose-Capillary 169 (H) 70 - 99 mg/dL  Glucose, capillary     Status: Abnormal   Collection Time: 09/08/22  4:00 AM  Result Value Ref Range   Glucose-Capillary 119 (H) 70 - 99 mg/dL  Prealbumin     Status: Abnormal   Collection Time: 09/08/22  5:52 AM  Result Value Ref Range   Prealbumin 14 (L) 18 - 38 mg/dL  CBC     Status: Abnormal   Collection Time: 09/08/22  5:52 AM  Result Value Ref Range   WBC 5.7 4.0 - 10.5 K/uL   RBC 2.49 (L) 3.87 - 5.11 MIL/uL   Hemoglobin 8.4 (L) 12.0 - 15.0 g/dL   HCT 24.3 (L) 36.0 - 46.0 %   MCV 97.6 80.0 - 100.0 fL   MCH 33.7 26.0 - 34.0 pg   MCHC 34.6 30.0 - 36.0 g/dL   RDW 13.0 11.5 - 15.5 %   Platelets 193 150 - 400 K/uL   nRBC 0.0 0.0 - 0.2 %  Comprehensive metabolic panel     Status: Abnormal   Collection Time: 09/08/22  5:52 AM  Result Value Ref Range   Sodium 137 135 - 145 mmol/L   Potassium 3.9 3.5 - 5.1 mmol/L   Chloride 102 98 - 111 mmol/L   CO2 26 22 - 32 mmol/L   Glucose, Bld 126 (H) 70 - 99 mg/dL   BUN 16 8 - 23 mg/dL   Creatinine, Ser 1.12 (H) 0.44 - 1.00 mg/dL   Calcium 8.3 (L)  8.9 - 10.3 mg/dL   Total Protein 5.2 (L) 6.5 - 8.1 g/dL   Albumin 2.8 (L) 3.5 - 5.0 g/dL   AST 14 (L) 15 - 41 U/L   ALT 11 0 - 44 U/L   Alkaline Phosphatase 119 38 - 126 U/L   Total Bilirubin 0.4 0.3 - 1.2 mg/dL   GFR, Estimated 49 (L) >60 mL/min   Anion gap 9 5 - 15  VITAMIN D 25 Hydroxy (Vit-D Deficiency, Fractures)     Status: None   Collection Time: 09/08/22  5:52 AM  Result Value Ref Range   Vit D, 25-Hydroxy 37.98 30 - 100 ng/mL  Osmolality, urine     Status: None   Collection Time: 09/08/22  7:30 AM  Result Value Ref Range   Osmolality, Ur 501 300 - 900 mOsm/kg  Creatinine, urine, random     Status: None   Collection Time: 09/08/22  7:30 AM  Result Value Ref Range   Creatinine, Urine 56 mg/dL  Sodium, urine, random     Status: None   Collection Time: 09/08/22  7:30 AM  Result Value  Ref Range   Sodium, Ur 151 mmol/L  Urinalysis, Complete w Microscopic -Urine, Clean Catch     Status: None   Collection Time: 09/08/22  7:30 AM  Result Value Ref Range   Color, Urine YELLOW YELLOW   APPearance CLEAR CLEAR   Specific Gravity, Urine 1.015 1.005 - 1.030   pH 8.0 5.0 - 8.0   Glucose, UA NEGATIVE NEGATIVE mg/dL   Hgb urine dipstick NEGATIVE NEGATIVE   Bilirubin Urine NEGATIVE NEGATIVE   Ketones, ur NEGATIVE NEGATIVE mg/dL   Protein, ur NEGATIVE NEGATIVE mg/dL   Nitrite NEGATIVE NEGATIVE   Leukocytes,Ua NEGATIVE NEGATIVE   Squamous Epithelial / HPF 0-5 0 - 5 /HPF   WBC, UA 0-5 0 - 5 WBC/hpf   RBC / HPF 0-5 0 - 5 RBC/hpf   Bacteria, UA NONE SEEN NONE SEEN  Glucose, capillary     Status: Abnormal   Collection Time: 09/08/22  8:41 AM  Result Value Ref Range   Glucose-Capillary 135 (H) 70 - 99 mg/dL     PHYSICAL EXAM:   Gen: resting comfortably in bed, sitting up  Ext:       Left Lower Extremity   Short leg splint fitting well, good mold   Ext warm   Moves toes without difficulty   Altered sensation to foot due to diabetic peripheral neuropathy   Normal sensation noted about 1/2 way up leg   No pain out of proportion with passive stretching of toes   Swelling controlled    Assessment/Plan:      Anti-infectives (From admission, onward)    Start     Dose/Rate Route Frequency Ordered Stop   09/07/22 2230  cefTRIAXone (ROCEPHIN) 1 g in sodium chloride 0.9 % 100 mL IVPB        1 g 200 mL/hr over 30 Minutes Intravenous Every 24 hours 09/07/22 2132 09/14/22 2229     .  POD/HD#: 1  82 y/o female with complex medical history including chronic R leg weakness (presumably from Lumbar pathology which has been addressed surgically, Dr. Zada Finders), deconditioning, diabetes with peripheral neuropathy s/p fall with acute L bimalleolar ankle fracture dislocation   -L bimalleolar ankle fracture dislocation s/p closed reduction in ED  Will need surgery to address this  unstable fracture  Clinical picture complicated due to deconditioning and recent lumbar laminectomy and fusion  Pt has not  been able to use her R leg to help with ambulation due to weakness. She has stated to see some improvements, albeit slowly   Pts left leg was her good leg up until now    She will really be slide or lift transfers as her R leg is too weak    Needs SNF   If pt in-house come Monday we will put her on the OR schedule on Tuesday to proceed with ORIF    Continue with aggressive ice and elevation    NWB L leg     - Pain management:  Multimodal   - Medical issues   Per primary   - DVT/PE prophylaxis:  Lovenox   - Metabolic Bone Disease:  Dexa early 2023, scanned only left femoral neck which showed osteopenia   Given R ankle fracture about 1 1/2 years ago and L1 compression fracture [id number] she would certainly meet criteria to start treatment for osteoporosis.  They plan to discuss with PCP  - Dispo:  Continue with current care  Will follow up on Monday if still here o/w follow up in office in 09/13/2022, will need to call for appointment     Jari Pigg, PA-C [phone] (C) 09/08/2022, 10:22 AM  [facility] [place] [phone] Jenetta [phone] (F)    After 5pm and on the weekends please log on to Amion, go to orthopaedics and the look under the [facility] Call for the provider(s) on call. You can also call our office at [phone] and then follow the prompts to be connected to the call team.  Patient ID: [name], female   DOB: September 05, 1940, 83 y.o.   MRN: [id number]

## 2022-09-08 NOTE — NC FL2 (Signed)
[place] LEVEL OF CARE FORM     IDENTIFICATION  Patient Name: Teresa Daniels Birthdate: 17-Mar-1941 Sex: female Admission Date (Current Location): 09/07/2022  [facility] and [place] Number:  Herbalist and Address:  [facility], [place] [address] [place], [place] [address]      Provider Number: [id number]  Attending Physician Name and Address:  Shelly Coss, MD  Relative Name and Phone Number:  [place],  [phone]    Current Level of Care: Hospital Recommended Level of Care: [place] Prior Approval Number:    Date Approved/Denied:   PASRR Number: [id number]  Discharge Plan: SNF    Current Diagnoses: Patient Active Problem List   Diagnosis Date Noted   Ankle fracture 09/07/2022   Hypokalemia 09/07/2022   Cellulitis in diabetic foot ([place]) 09/07/2022   Chronic combined systolic and diastolic CHF (congestive heart failure) ([place]) 09/07/2022   Asthma, chronic 09/07/2022   DM2 (diabetes mellitus, type 2) ([place]) 09/07/2022   Spondylolisthesis of lumbar region 07/25/2022   Lumbar compression fracture ([place]) 04/06/2022   Epidural hematoma ([place]) 03/14/2022   Post-op pain 03/11/2022   Lumbar stenosis with neurogenic claudication 03/09/2022   CAP (community acquired pneumonia) 02/06/2021   CKD (chronic kidney disease) stage 4, GFR 15-29 ml/min (HCC) 02/06/2021   Generalized weakness 02/06/2021   Fracture dislocation of right ankle 02/06/2021   COVID-19 08/08/2019   Acute respiratory failure with hypoxemia ([place]) 08/08/2019   History of CVA (cerebrovascular accident) 08/08/2019   Anemia 08/08/2019   CKD (chronic kidney disease), stage III ([place]) 08/08/2019   Dehydration    Vitamin B12 deficiency 02/10/2019   Unsteady gait 02/10/2019   History of stroke 12/06/2017   Diabetic polyneuropathy associated with type 2 diabetes mellitus ([place]) 12/06/2017    Orientation RESPIRATION BLADDER Height & Weight      Self, Time, Situation, Place  Normal Incontinent Weight: 158 lb 11.7 oz (72 kg) Height:  '5\' 5"'$  (165.1 cm)  BEHAVIORAL SYMPTOMS/MOOD NEUROLOGICAL BOWEL NUTRITION STATUS      Incontinent Diet (See DC summary)  AMBULATORY STATUS COMMUNICATION OF NEEDS Skin   Extensive Assist Verbally Skin abrasions (R 5th toe lac)                       Personal Care Assistance Level of Assistance  Bathing, Feeding, Dressing Bathing Assistance: Maximum assistance Feeding assistance: Independent Dressing Assistance: Maximum assistance     Functional Limitations Info  Sight, Hearing, Speech Sight Info: Adequate Hearing Info: Impaired Speech Info: Adequate    SPECIAL CARE FACTORS FREQUENCY  PT (By licensed PT), OT (By licensed OT)     PT Frequency: 5x week OT Frequency: 5x week            Contractures Contractures Info: Not present    Additional Factors Info  Code Status, Allergies, Insulin Sliding Scale Code Status Info: Full Allergies Info: Cat Hair Extract   Insulin Sliding Scale Info: See DC summary       Current Medications (09/08/2022):  This is the current hospital active medication list Current Facility-Administered Medications  Medication Dose Route Frequency Provider Last Rate Last Admin   atorvastatin (LIPITOR) tablet 40 mg  40 mg Oral QHS Doutova, Anastassia, MD       budesonide (PULMICORT) nebulizer solution 0.25 mg  0.25 mg Nebulization BID Doutova, Anastassia, MD       cephALEXin (KEFLEX) capsule 500 mg  500 mg Oral Q8H Shelly Coss, MD  clopidogrel (PLAVIX) tablet 75 mg  75 mg Oral q AM [name], MD   75 mg at 09/08/22 0948   gabapentin (NEURONTIN) capsule 300 mg  300 mg Oral QPC supper [name], MD   300 mg at 09/07/22 2023   HYDROcodone-acetaminophen (NORCO/VICODIN) 5-325 MG per tablet 1-2 tablet  1-2 tablet Oral Q6H PRN [name], MD   1 tablet at 09/08/22 0519   insulin aspart (novoLOG) injection 0-9 Units  0-9 Units  Subcutaneous Q4H [name], MD   1 Units at 09/08/22 0948   methocarbamol (ROBAXIN) tablet 500 mg  500 mg Oral Q6H PRN [name], MD       Or   methocarbamol (ROBAXIN) 500 mg in dextrose 5 % 50 mL IVPB  500 mg Intravenous Q6H PRN Doutova, Anastassia, MD       montelukast (SINGULAIR) tablet 10 mg  10 mg Oral QPC supper [name], MD   10 mg at 09/07/22 2023   morphine (PF) 2 MG/ML injection 0.5 mg  0.5 mg Intravenous Q2H PRN [name], MD       multivitamin with minerals tablet 1 tablet  1 tablet Oral Daily Adhikari, Amrit, MD       mupirocin ointment (BACTROBAN) 2 %   Topical BID Adhikari, Amrit, MD       oxyCODONE (Oxy IR/ROXICODONE) immediate release tablet 5 mg  5 mg Oral Q4H PRN Doutova, Anastassia, MD       pantoprazole (PROTONIX) EC tablet 40 mg  40 mg Oral QAC breakfast Doutova, Anastassia, MD   40 mg at 09/08/22 0948   protein supplement (ENSURE MAX) liquid  11 oz Oral BID Shelly Coss, MD       sertraline (ZOLOFT) tablet 100 mg  100 mg Oral QPC supper [name], MD   100 mg at 09/08/22 0015     Discharge Medications: Please see discharge summary for a list of discharge medications.  Relevant Imaging Results:  Relevant Lab Results:   Additional Information SSN: [id number]  [place], [place]

## 2022-09-08 NOTE — Progress Notes (Signed)
PROGRESS NOTE  BHAKTI WOJNO  [id number] DOB: 03/11/1941 DOA: 09/07/2022 PCP: [name], PA-C   Brief Narrative: Patient is a 82 year old female with history of diabetes type 2, CKD, anemia, hyperlipidemia, spondylolisthesis, laminectomy who presented from independent living facility with complaint of fall and severe left ankle pain.  On presentation ,she was noted to have swelling, deformity and tenderness of the left ankle. She was recently fell and had laceration of the right foot with fracture of the small toe. x-ray showed bimalleolar fracture with dislocation of the left ankle.  Orthopedics consulted and recommended outpatient follow-up in a week to discuss about ORIF.  PT/OT consulted for evaluation for SNF.  If patient still remains here till Monday, orthopedics planning for ORIF  Assessment & Plan:  Principal Problem:   Ankle fracture Active Problems:   History of stroke   Anemia   CKD (chronic kidney disease), stage III (HCC)   Lumbar stenosis with neurogenic claudication   Hypokalemia   Cellulitis in diabetic foot (HCC)   Chronic combined systolic and diastolic CHF (congestive heart failure) (HCC)   Asthma, chronic   DM2 (diabetes mellitus, type 2) (HCC)   Left ankle bimalleolar fracture: history of recurrent falls.  She uses wheelchair on/off.Recently discharged to independent living facility from skilled nursing.  Presented with severe left ankle pain after a fall.  Found to have closed bimalleolar fracture of left ankle.  Orthopedics already consulted and saw the patient. Applied splint. Recommended outpatient follow-up in a week to discuss about ORIF.  Recommended to be nonweightbearing on the left lower extremity. Patient is currently at independent living, might need skilled nursing on discharge.  Will consult PT/OT.  Continue pain management, supportive care. If patient remains in-house till Monday, orthopedics planning to follow-up and preoperative ORIF TOC  consulted  Right foot Cellulitis/wound : She recently fell and fractured her right small toe.  Right toenail has fallen.  Wound looks good.  Was taking Augmentin as an outpatient.  Changed to Rocephin on admission, will continue Keflex for few days.Wound care will be consulted  CKD stage IIIa: Currently kidney function at baseline.  Abnormal toxins  History of chronic back pain/lumbar stenosis: Currently mostly wheelchair-bound.  Status post laminectomy.  Hypokalemia: Supplemented and corrected.  History of combined systolic/diastolic CHF: Found to be dehydrated on presentation.  Started on gentle IV fluids,now stopped.  Last echo showed EF of 50 to [id number], grade 1 diastolic dysfunction.  Currently she looks euvolemic  Chronic asthma: Currently stable.  Continue home inhalers, bronchodilators  Chronic normocytic anemia: Currently hemoglobin in the range of 8-9.  She states she is chronically anemic.  Denies any hematochezia or melena.  Iron studies showed optimal iron level  History of stroke: On Plavix daily.  Has mild right-sided residual deficits  Diabetes type 2: Currently on sliding scale.  Monitor blood sugars          DVT prophylaxis:SCDs Start: 09/07/22 1951     Code Status: Full Code  Family Communication: Called and discussed with daughter on phone on 3/1  Patient status:Inpatient  Patient is from : Independent living facility  Anticipated discharge to: Skilled nursing facility  Estimated DC date: Not sure   Consultants: Orthopedics  Procedures: Splint placement on left lower extremity  Antimicrobials:  Anti-infectives (From admission, onward)    Start     Dose/Rate Route Frequency Ordered Stop   09/07/22 2230  cefTRIAXone (ROCEPHIN) 1 g in sodium chloride 0.9 % 100 mL IVPB  1 g 200 mL/hr over 30 Minutes Intravenous Every 24 hours 09/07/22 2132 09/14/22 2229       Subjective: Patient seen and examined at bedside today.  Hemodynamically stable.   Very comfortable, pain well-controlled.  Eating her breakfast  Objective: Vitals:   09/07/22 2110 09/08/22 0115 09/08/22 0430 09/08/22 0808  BP: 111/78 136/62 125/62 (!) 113/56  Pulse: 90 77 89 83  Resp:  '18 20 16  '$ Temp: 99.6 F (37.6 C) 98.5 F (36.9 C) 99.7 F (37.6 C) 98.2 F (36.8 C)  TempSrc: Oral Oral Oral Oral  SpO2: 100% 96% 93% 97%  Weight:      Height:       No intake or output data in the 24 hours ending 09/08/22 0832 Filed Weights   09/07/22 1114  Weight: 72 kg    Examination:  General exam: Overall comfortable, not in distress, pleasant elderly female HEENT: PERRL Respiratory system:  no wheezes or crackles  Cardiovascular system: S1 & S2 heard, RRR.  Gastrointestinal system: Abdomen is nondistended, soft and nontender. Central nervous system: Alert and oriented Extremities: Splint on the left upper extremity, ulcer on the small toe on the right, absence of nail on the right great toe Skin: No rashes, no icterus     Data Reviewed: I have personally reviewed following labs and imaging studies  CBC: Recent Labs  Lab 09/07/22 1718 09/08/22 0552  WBC 6.8 5.7  NEUTROABS 4.9  --   HGB 9.8* 8.4*  HCT 29.9* 24.3*  MCV 101.0* 97.6  PLT 224 0000000   Basic Metabolic Panel: Recent Labs  Lab 09/07/22 1718 09/07/22 1726 09/07/22 2008 09/08/22 0552  NA 139  --   --  137  K 3.2*  --   --  3.9  CL 101  --   --  102  CO2 27  --   --  26  GLUCOSE 133*  --   --  126*  BUN 20  --   --  16  CREATININE 1.28*  --   --  1.12*  CALCIUM 9.0  --   --  8.3*  MG  --  1.9  --   --   PHOS  --   --  3.0  --      No results found for this or any previous visit (from the past 240 hour(s)).   Radiology Studies: VAS [place] LOWER EXTREMITY VENOUS (DVT) (7a-7p)  Result Date: 09/07/2022  Lower Venous DVT Study Patient Name:  ADELAE WEGMAN  Date of Exam:   09/07/2022 Medical Rec #: [id number]       Accession #:    [id number] Date of Birth: Jun 11, 1941      Patient Gender: F  Patient Age:   50 years Exam Location:  [facility] Procedure:      VAS [place] LOWER EXTREMITY VENOUS (DVT) Referring Phys: Leanord Asal --------------------------------------------------------------------------------  Indications: Right calf erythema, swelling.  Comparison Study: No prior studies. Performing Technologist: Darlin Coco RDMS, RVT  Examination Guidelines: A complete evaluation includes B-mode imaging, spectral Doppler, color Doppler, and power Doppler as needed of all accessible portions of each vessel. Bilateral testing is considered an integral part of a complete examination. Limited examinations for reoccurring indications may be performed as noted. The reflux portion of the exam is performed with the patient in reverse Trendelenburg.  +---------+---------------+---------+-----------+----------+--------------+ RIGHT    CompressibilityPhasicitySpontaneityPropertiesThrombus Aging +---------+---------------+---------+-----------+----------+--------------+ CFV      Full  Yes      Yes                                 +---------+---------------+---------+-----------+----------+--------------+ SFJ      Full                                                        +---------+---------------+---------+-----------+----------+--------------+ FV Prox  Full                                                        +---------+---------------+---------+-----------+----------+--------------+ FV Mid   Full                                                        +---------+---------------+---------+-----------+----------+--------------+ FV DistalFull                                                        +---------+---------------+---------+-----------+----------+--------------+ PFV      Full                                                        +---------+---------------+---------+-----------+----------+--------------+ POP      Full           Yes       Yes                                 +---------+---------------+---------+-----------+----------+--------------+ PTV      Full                                                        +---------+---------------+---------+-----------+----------+--------------+ PERO     Full                                                        +---------+---------------+---------+-----------+----------+--------------+   +----+---------------+---------+-----------+----------+--------------+ LEFTCompressibilityPhasicitySpontaneityPropertiesThrombus Aging +----+---------------+---------+-----------+----------+--------------+ CFV Full           Yes      Yes                                 +----+---------------+---------+-----------+----------+--------------+     Summary: RIGHT: - There is no evidence of deep vein  thrombosis in the lower extremity.  - No cystic structure found in the popliteal fossa.  LEFT: - No evidence of common femoral vein obstruction.  *See table(s) above for measurements and observations.    Preliminary    DG Ankle 2 Views Left  Result Date: 09/07/2022 CLINICAL DATA:  Ankle fracture, postreduction. EXAM: LEFT ANKLE - 2 VIEW COMPARISON:  Preoperative radiograph earlier today. FINDINGS: Improved alignment of bimalleolar fracture postreduction. Overlying splint material limits osseous and soft tissue fine detail. The ankle mortise is currently congruent. No obvious posterior tibial tubercle fracture is seen. IMPRESSION: Improved alignment of bimalleolar fracture postreduction. Electronically Signed   By: Keith Rake M.D.   On: 09/07/2022 15:54   DG Knee Left Port  Result Date: 09/07/2022 CLINICAL DATA:  Ankle fracture.  Fall. EXAM: PORTABLE LEFT KNEE - 1-2 VIEW COMPARISON:  None Available. FINDINGS: There is minimal joint space narrowing of the MEDIAL compartment. No acute fracture or subluxation. No significant joint effusion. No radiopaque foreign body. IMPRESSION: Minimal  degenerative changes. No evidence for acute abnormality. Electronically Signed   By: Nolon Nations M.D.   On: 09/07/2022 15:52   CT Head Wo Contrast  Result Date: 09/07/2022 CLINICAL DATA:  Fall EXAM: CT HEAD WITHOUT CONTRAST CT CERVICAL SPINE WITHOUT CONTRAST TECHNIQUE: Multidetector CT imaging of the head and cervical spine was performed following the standard protocol without intravenous contrast. Multiplanar CT image reconstructions of the cervical spine were also generated. RADIATION DOSE REDUCTION: This exam was performed according to the departmental dose-optimization program which includes automated exposure control, adjustment of the mA and/or kV according to patient size and/or use of iterative reconstruction technique. COMPARISON:  CT head and cervical spine 03/08/2018 FINDINGS: CT HEAD FINDINGS Brain: There is no acute intracranial hemorrhage, extra-axial fluid collection, or acute infarct Parenchymal volume is normal for age. The ventricles are normal in size. Gray-white differentiation is preserved. Patchy hypodensity in the supratentorial white matter likely reflects sequela of chronic small-vessel ischemic change. The pituitary and suprasellar region are normal. There is no mass lesion. There is no mass effect or midline shift. Vascular: There is calcification of the bilateral carotid siphons. Skull: Normal. Negative for fracture or focal lesion. Sinuses/Orbits: The paranasal sinuses are clear. Bilateral lens implants are in place. The globes and orbits are otherwise unremarkable. Other: None. CT CERVICAL SPINE FINDINGS Alignment: Normal. There is no jumped or perched facet or other evidence of traumatic malalignment Skull base and vertebrae: Skull base alignment is maintained. Vertebral body heights are preserved. There is no evidence of acute fracture. There is no suspicious osseous lesion. Soft tissues and spinal canal: No prevertebral fluid or swelling. No visible canal hematoma. Disc  levels: There is disc space narrowing and degenerative endplate change most advanced at C5-C6 with prominent anterior osteophytes throughout the remainder of the cervical spine. There is multilevel facet arthropathy most advanced on the left at C3-C4 through C5-C6. There is no evidence of high-grade spinal canal stenosis. Upper chest: There is right worse than left apical scarring. Other: None. IMPRESSION: 1. No acute intracranial pathology. 2. No acute fracture or traumatic malalignment of the cervical spine. Electronically Signed   By: Valetta Mole M.D.   On: 09/07/2022 12:47   CT Cervical Spine Wo Contrast  Result Date: 09/07/2022 CLINICAL DATA:  Fall EXAM: CT HEAD WITHOUT CONTRAST CT CERVICAL SPINE WITHOUT CONTRAST TECHNIQUE: Multidetector CT imaging of the head and cervical spine was performed following the standard protocol without intravenous contrast. Multiplanar CT image reconstructions of the  cervical spine were also generated. RADIATION DOSE REDUCTION: This exam was performed according to the departmental dose-optimization program which includes automated exposure control, adjustment of the mA and/or kV according to patient size and/or use of iterative reconstruction technique. COMPARISON:  CT head and cervical spine 03/08/2018 FINDINGS: CT HEAD FINDINGS Brain: There is no acute intracranial hemorrhage, extra-axial fluid collection, or acute infarct Parenchymal volume is normal for age. The ventricles are normal in size. Gray-white differentiation is preserved. Patchy hypodensity in the supratentorial white matter likely reflects sequela of chronic small-vessel ischemic change. The pituitary and suprasellar region are normal. There is no mass lesion. There is no mass effect or midline shift. Vascular: There is calcification of the bilateral carotid siphons. Skull: Normal. Negative for fracture or focal lesion. Sinuses/Orbits: The paranasal sinuses are clear. Bilateral lens implants are in place. The  globes and orbits are otherwise unremarkable. Other: None. CT CERVICAL SPINE FINDINGS Alignment: Normal. There is no jumped or perched facet or other evidence of traumatic malalignment Skull base and vertebrae: Skull base alignment is maintained. Vertebral body heights are preserved. There is no evidence of acute fracture. There is no suspicious osseous lesion. Soft tissues and spinal canal: No prevertebral fluid or swelling. No visible canal hematoma. Disc levels: There is disc space narrowing and degenerative endplate change most advanced at C5-C6 with prominent anterior osteophytes throughout the remainder of the cervical spine. There is multilevel facet arthropathy most advanced on the left at C3-C4 through C5-C6. There is no evidence of high-grade spinal canal stenosis. Upper chest: There is right worse than left apical scarring. Other: None. IMPRESSION: 1. No acute intracranial pathology. 2. No acute fracture or traumatic malalignment of the cervical spine. Electronically Signed   By: Valetta Mole M.D.   On: 09/07/2022 12:47   DG Ankle Complete Left  Result Date: 09/07/2022 CLINICAL DATA:  Trauma, fall EXAM: LEFT ANKLE COMPLETE - 3+ VIEW COMPARISON:  None Available. FINDINGS: There is bimalleolar fracture dislocation. Comminuted displaced fractures are seen in the base of medial malleolus and the distal fibula. There is lateral displacement of talus in relation to distal tibia. There is abnormal widening of joint space between the distal tibia and talus in the anterior aspect. IMPRESSION: Comminuted displaced fractures are seen in distal tibia and fibula. There is lateral dislocation in the left ankle. Electronically Signed   By: Elmer Picker M.D.   On: 09/07/2022 11:40   DG Pelvis Portable  Result Date: 09/07/2022 CLINICAL DATA:  Trauma, fall EXAM: PORTABLE PELVIS 1-2 VIEWS COMPARISON:  None Available. FINDINGS: No displaced fracture or dislocation is seen. Degenerative changes are noted in  lumbar spine. There is previous surgical fusion at the L4-L5 level. IMPRESSION: No fracture or dislocation is seen in pelvis. Electronically Signed   By: Elmer Picker M.D.   On: 09/07/2022 11:38   DG Chest Port 1 View  Result Date: 09/07/2022 CLINICAL DATA:  Status post fall. EXAM: PORTABLE CHEST 1 VIEW COMPARISON:  Radiographs 03/10/2022 and 02/06/2021. FINDINGS: 1122 hours. The heart size and mediastinal contours are stable. The lungs appear unchanged with mild chronic interstitial prominence. No edema, confluent airspace opacity, pleural effusion or pneumothorax. No acute fractures are identified. There are asymmetric glenohumeral degenerative changes on the left and mild thoracic spondylosis. Telemetry leads overlie the chest. IMPRESSION: No evidence of acute chest injury. Stable mild chronic interstitial prominence. Electronically Signed   By: Richardean Sale M.D.   On: 09/07/2022 11:38    Scheduled Meds:  atorvastatin  40  mg Oral QHS   budesonide (PULMICORT) nebulizer solution  0.25 mg Nebulization BID   clopidogrel  75 mg Oral q AM   gabapentin  300 mg Oral QPC supper   insulin aspart  0-9 Units Subcutaneous Q4H   montelukast  10 mg Oral QPC supper   pantoprazole  40 mg Oral QAC breakfast   sertraline  100 mg Oral QPC supper   Continuous Infusions:  cefTRIAXone (ROCEPHIN)  IV 1 g (09/08/22 0008)   methocarbamol (ROBAXIN) IV       LOS: 1 day   Shelly Coss, MD [facility] P3/07/2022, 8:32 AM

## 2022-09-08 NOTE — Care Management CC44 (Signed)
Condition Code 44 Documentation Completed  Patient Details  Name: Teresa Daniels MRN: [id number] Date of Birth: 30-Jul-1940   Condition Code 44 given:  Yes Patient signature on Condition Code 44 notice:  Yes Documentation of 2 MD's agreement:  Yes Code 44 added to claim:  Yes    Coralee Pesa, [place] 09/08/2022, 3:52 PM

## 2022-09-08 NOTE — Evaluation (Signed)
Physical Therapy Evaluation Patient Details Name: Teresa Daniels MRN: [id number] DOB: 07/13/1940 Today's Date: 09/08/2022  History of Present Illness  Pt is 82 yo female presenting to ED following a fall. PT hit her head but denies LOC. Deformity and pain in the L ankle. Currently status post reduction of the L ankle with applied splint and conservative management per orthopedics. Pt w/c bound at baseline due to recent lumbar surgery. Pt has 2 broken toes on her L foot. PMH includes recent lumbar surgery in January, asthma, CKD IV, depression, DM II, HTN, and stroke.  Clinical Impression  Pt is presenting below baseline. Pt was able to perform transfers at Mod I at baseline but had had 3 falls. Currently pt was unable to get to standing and was Max A for scooting EOB and Mod A for supine<>sitting EOB. Due to pt current functional status, home set up and available assistance at home currently recommending skilled physical therapy services at a higher level of care and frequency on discharge in order to decrease risk for falls, injury, immobility, skin break down and re-hospitalization. Pt demonstrates no signs/symptoms of cardiac/respiratory distress throughout session.      Recommendations for follow up therapy are one component of a multi-disciplinary discharge planning process, led by the attending physician.  Recommendations may be updated based on patient status, additional functional criteria and insurance authorization.  Follow Up Recommendations Skilled nursing-short term rehab (<3 hours/day) Can patient physically be transported by private vehicle: No    Assistance Recommended at Discharge Frequent or constant Supervision/Assistance  Patient can return home with the following  Two people to help with walking and/or transfers;Assist for transportation;Help with stairs or ramp for entrance;Assistance with cooking/housework    Equipment Recommendations Other (comment) (Defer to post acute)   Recommendations for Other Services       Functional Status Assessment Patient has had a recent decline in their functional status and demonstrates the ability to make significant improvements in function in a reasonable and predictable amount of time.     Precautions / Restrictions Precautions Precautions: Fall Restrictions Weight Bearing Restrictions: Yes LLE Weight Bearing: Non weight bearing Other Position/Activity Restrictions: broken toes on the R foot      Mobility  Bed Mobility Overal bed mobility: Needs Assistance Bed Mobility: Supine to Sit, Sit to Supine, Rolling Rolling: Mod assist   Supine to sit: Mod assist Sit to supine: Mod assist   General bed mobility comments: Pt requires Mod a for RLE and trunk for Supine<>sitting. She is able to participate and follow directions. She requires some verbal cues to initiate and encouragement to participate. Pt was able to sit EOB for ~ 5 min with significant pain in the RLE on lying back down. Pt was able to scoot EOB at Max A without clearing her buttocks from EOB. Patient Response: Cooperative, Anxious  Transfers     General transfer comment: Pt declined attempting stating that she can barely put weight through her R foot due to broken toes.    Ambulation/Gait     General Gait Details: unable to progress today      Balance Overall balance assessment: Needs assistance Sitting-balance support: Bilateral upper extremity supported Sitting balance-Leahy Scale: Fair Sitting balance - Comments: no LOB needs Min a to stabilize initially on sitting.         Pertinent Vitals/Pain Pain Assessment Pain Assessment: Faces Faces Pain Scale: Hurts even more Pain Descriptors / Indicators: Grimacing, Guarding Pain Intervention(s): Monitored during session, Limited activity  within patient's tolerance    Home Living Family/patient expects to be discharged to:: Skilled nursing facility       Additional Comments: Lives at  [facility]    Prior Function Prior Level of Function : Needs assist   Mobility Comments: Pt was at W/C level prior to hospitalization and was performing transfers at Mod I. ADLs Comments: Mod I for bathing, dressing and toileting. Meals and cleaning provided at facility.     Hand Dominance   Dominant Hand: Right    Extremity/Trunk Assessment   Upper Extremity Assessment Upper Extremity Assessment: Defer to OT evaluation    Lower Extremity Assessment Lower Extremity Assessment: Generalized weakness;LLE deficits/detail;RLE deficits/detail RLE Deficits / Details: broken 5th toe possibly 4th toe LLE Deficits / Details: Recent reduction and splinting of the L anke    Cervical / Trunk Assessment Cervical / Trunk Assessment: Kyphotic  Communication   Communication: No difficulties  Cognition Arousal/Alertness: Awake/alert Behavior During Therapy: WFL for tasks assessed/performed Overall Cognitive Status: Within Functional Limits for tasks assessed     General Comments: Pt is not aware of extent of deficits        General Comments General comments (skin integrity, edema, etc.): Pt just finished at rehab and was transferred to [facility] due to back surgery. She has had 3 falls resulting in broken toes on the R foot and now broken Ankle on the L foot.        Assessment/Plan    PT Assessment Patient needs continued PT services  PT Problem List Decreased strength;Decreased mobility;Decreased activity tolerance;Pain       PT Treatment Interventions DME instruction;Therapeutic activities;Modalities;Gait training;Therapeutic exercise;Patient/family education;Balance training;Wheelchair mobility training;Functional mobility training;Neuromuscular re-education;Manual techniques    PT Goals (Current goals can be found in the Care Plan section)  Acute Rehab PT Goals Patient Stated Goal: Pt would like to go back home. PT Goal Formulation: With patient Time For Goal  Achievement: 09/22/22 Potential to Achieve Goals: Fair    Frequency Min 3X/week        AM-PAC PT "6 Clicks" Mobility  Outcome Measure Help needed turning from your back to your side while in a flat bed without using bedrails?: A Lot Help needed moving from lying on your back to sitting on the side of a flat bed without using bedrails?: A Lot Help needed moving to and from a bed to a chair (including a wheelchair)?: Total Help needed standing up from a chair using your arms (e.g., wheelchair or bedside chair)?: Total Help needed to walk in hospital room?: Total Help needed climbing 3-5 steps with a railing? : Total 6 Click Score: 8    End of Session   Activity Tolerance: Patient limited by pain Patient left: in bed;with call bell/phone within reach;with bed alarm set Nurse Communication: Mobility status PT Visit Diagnosis: Other abnormalities of gait and mobility (R26.89);History of falling (Z91.81)    Time: [id number] PT Time Calculation (min) (ACUTE ONLY): 23 min   Charges:   PT Evaluation $PT Eval Low Complexity: 1 Low PT Treatments $Therapeutic Activity: 8-22 mins       Tomma Rakers, DPT, CLT  Acute Rehabilitation Services Office: [phone] (Secure chat preferred)   Ander Purpura 09/08/2022, 12:34 PM

## 2022-09-08 NOTE — Social Work (Signed)
  Transition of Care [facility]) Screening Note   Patient Details  Name: Teresa Daniels Date of Birth: 1940-07-24   Transition of Care [facility]) CM/SW Contact:    Coralee Pesa, [place] Phone Number: 09/08/2022, 11:44 AM    Transition of Care Department [facility]) has reviewed patient and CSW acknowledges consult for SNF placement. This will be initiated when appropriate. We will continue to monitor patient advancement through interdisciplinary progression rounds. If new patient transition needs arise, please place a TOC consult.

## 2022-09-08 NOTE — Consult Note (Signed)
[facility] Nurse Consult Note: Reason for Consult: r foot wounds  Wound type: traumatic   Pressure Injury POA: NA; not related to pressure  Measurement:1.  Right 5th digit partial thickness skin loss 2 cms x 1 cms 100% pink dry  2.  R great toe nail bed exposed after traumatic loss of nail, 100% red and dry   Drainage (amount, consistency, odor)  none  Periwound: intact R great toe, some ecchymosis noted to 5th digit (broken per patient)  Dressing procedure/placement/frequency: Clean R great toe nail bed and R 5th digit with NS, apply Mupirocin to wound beds twice daily, cover with Telfa non-stick dressing and secure with gauze and tape or a foam dressing, whichever is preferred.  Change foam dressings q3 days and prn soiling.   POC discussed with patient and bedside nurse.   WOC will not follow patient at this time.  Re-consult if further wound care needs arise.   Thank you,    Merwyn Hodapp MSN, RN-BC, [employer]

## 2022-09-08 NOTE — Progress Notes (Signed)
Initial Nutrition Assessment  DOCUMENTATION CODES:   Not applicable  INTERVENTION:  - Add Ensure Max po BID, each supplement provides 150 kcal and 30 grams of protein.    - Add MVI q day.   NUTRITION DIAGNOSIS:   Increased nutrient needs related to hip fracture as evidenced by estimated needs.  GOAL:   Patient will meet greater than or equal to 90% of their needs  MONITOR:   Supplement acceptance  REASON FOR ASSESSMENT:   Consult Assessment of nutrition requirement/status  ASSESSMENT:   82 y.o. female admits related to fall. PMH includes: CKD, DM, GERD, HTN, stroke. Pt is currently receiving medical management related to ankle fracture.  Meds reviewed: lipitor, sliding scale insulin. Labs reviewed.   The pt reports that she has not been eating well since admission. She states that this is not usual for her and that she normally has a good appetite. No significant wt loss per record. RD explained increased nutrient needs in setting of fracture. Pt agreed to trial supplements. RD will add supplements and vitamin and will continue to monitor PO intakes.   NUTRITION - FOCUSED PHYSICAL EXAM:  WDL - no wasting noted.   Diet Order:   Diet Order             Diet Carb Modified Fluid consistency: Thin; Room service appropriate? Yes  Diet effective now                   EDUCATION NEEDS:   Not appropriate for education at this time  Skin:  Skin Assessment: Reviewed RN Assessment  Last BM:     Height:   Ht Readings from Last 1 Encounters:  09/07/22 '5\' 5"'$  (1.651 m)    Weight:   Wt Readings from Last 1 Encounters:  09/07/22 72 kg    Ideal Body Weight:     BMI:  Body mass index is 26.41 kg/m.  Estimated Nutritional Needs:   Kcal:  1800-2160 kcals  Protein:  90-110 gm  Fluid:  >/= 1.8 L  Thalia Bloodgood, RD, LDN, CNSC.

## 2022-09-09 DIAGNOSIS — S82892A Other fracture of left lower leg, initial encounter for closed fracture: Secondary | ICD-10-CM | POA: Diagnosis not present

## 2022-09-09 LAB — GLUCOSE, CAPILLARY
Glucose-Capillary: 170 mg/dL — ABNORMAL HIGH (ref 70–99)
Glucose-Capillary: 173 mg/dL — ABNORMAL HIGH (ref 70–99)
Glucose-Capillary: 178 mg/dL — ABNORMAL HIGH (ref 70–99)
Glucose-Capillary: 180 mg/dL — ABNORMAL HIGH (ref 70–99)
Glucose-Capillary: 197 mg/dL — ABNORMAL HIGH (ref 70–99)

## 2022-09-09 LAB — CBC
HCT: 24.7 % — ABNORMAL LOW (ref 36.0–46.0)
Hemoglobin: 8.5 g/dL — ABNORMAL LOW (ref 12.0–15.0)
MCH: 33.5 pg (ref 26.0–34.0)
MCHC: 34.4 g/dL (ref 30.0–36.0)
MCV: 97.2 fL (ref 80.0–100.0)
Platelets: 209 10*3/uL (ref 150–400)
RBC: 2.54 MIL/uL — ABNORMAL LOW (ref 3.87–5.11)
RDW: 13 % (ref 11.5–15.5)
WBC: 6.5 10*3/uL (ref 4.0–10.5)
nRBC: 0 % (ref 0.0–0.2)

## 2022-09-09 LAB — HEMOGLOBIN A1C
Hgb A1c MFr Bld: 6.6 % — ABNORMAL HIGH (ref 4.8–5.6)
Mean Plasma Glucose: 143 mg/dL

## 2022-09-09 LAB — VITAMIN D 25 HYDROXY (VIT D DEFICIENCY, FRACTURES): Vit D, 25-Hydroxy: 40.87 ng/mL (ref 30–100)

## 2022-09-09 MED ORDER — HEPARIN SODIUM (PORCINE) 5000 UNIT/ML IJ SOLN
5000.0000 [IU] | Freq: Three times a day (TID) | INTRAMUSCULAR | Status: DC
  Administered 2022-09-09 – 2022-09-11 (×6): 5000 [IU] via SUBCUTANEOUS
  Filled 2022-09-09 (×6): qty 1

## 2022-09-09 NOTE — Progress Notes (Addendum)
PROGRESS NOTE    LARAINA DEMAINE  [id number] DOB: [birth year] DOA: 09/07/2022 PCP: [name], PA-C   Brief Narrative:  Patient is a 82 year old female with history of diabetes type 2, CKD, anemia, hyperlipidemia, spondylolisthesis, laminectomy who presented from independent living facility with complaint of fall and severe left ankle pain.  On presentation ,she was noted to have swelling, deformity and tenderness of the left ankle. She was recently fell and had laceration of the right foot with fracture of the small toe. x-ray showed bimalleolar fracture with dislocation of the left ankle.  Orthopedics consulted and recommended outpatient follow-up in a week to discuss about ORIF.  PT/OT consulted for evaluation for SNF.  If patient still remains here till Monday, orthopedics planning for ORIF   Assessment & Plan:   Left ankle bimalleolar fracture:  -history of recurrent falls.  Found to have closed bimalleolar fracture of left ankle.  Orthopedics already consulted and saw the patient. Applied splint. Recommended outpatient follow-up in a week to discuss about ORIF.  Recommended to be nonweightbearing on the left lower extremity. - Continue pain management, supportive care. -PT/OT recommended SNF -Ortho recommended ORIF likely on Tuesday if patient remains in the hospital -TOC consulted   Right foot Cellulitis/wound : She recently fell and fractured her right small toe.  Right toenail has fallen.  Wound looks good.  Was taking Augmentin as an outpatient.   -Changed to Rocephin on admission, will continue Keflex for few days. -Wound care  consulted   CKD stage IIIa: Currently kidney function at baseline.     History of chronic back pain/lumbar stenosis: Currently mostly wheelchair-bound.  Status post laminectomy.   Hypokalemia: Resolved  History of combined systolic/diastolic CHF: Found to be dehydrated on presentation.  Started on gentle IV fluids,now stopped.  Last echo showed EF  of 50 to [id number], grade 1 diastolic dysfunction.  Currently she looks euvolemic   Chronic asthma: Currently stable.  Continue home inhalers, bronchodilators, Singulair   Chronic normocytic anemia: Currently hemoglobin in the range of 8-9.  She states she is chronically anemic.  Denies any hematochezia or melena.  Iron studies showed optimal iron level   History of stroke: On Plavix daily.  Has mild right-sided residual deficits   Diabetes type 2: Currently on sliding scale.  Monitor blood sugars  Hyperlipidemia: Continue atorvastatin  GERD: Continue PPI  Depression anxiety: Continue Zoloft  Peripheral neuropathy: Continue gabapentin  DVT prophylaxis: SCD, heparin Code Status: Full code Family Communication:  None present at bedside.  Plan of care discussed with patient in length and she verbalized understanding and agreed with it. Disposition Plan: SNF  Consultants:  Ortho  Procedures:  None  Antimicrobials:  Keflex  Status is: Observation    Subjective: Patient seen and examined.  Resting comfortably on the bed.  Denies any complaints today.  Left ankle dressing dry and intact.  Remained afebrile.  No acute events overnight.  Objective: Vitals:   09/08/22 2114 09/08/22 2115 09/09/22 0532 09/09/22 0832  BP: 114/63  118/75 (!) 124/59  Pulse: 79  88 85  Resp: '16  18 17  '$ Temp: 98.3 F (36.8 C)  98.9 F (37.2 C) 98.6 F (37 C)  TempSrc: Oral  Oral Oral  SpO2: 97% 98% 98% 96%  Weight:      Height:        Intake/Output Summary (Last 24 hours) at 09/09/2022 1053 Last data filed at 09/08/2022 1847 Gross per 24 hour  Intake 720 ml  Output 500  ml  Net 220 ml   Filed Weights   09/07/22 1114  Weight: 72 kg    Examination:  General exam: Appears calm and comfortable, on room air, communicating well, pleasant elderly female, eating breakfast. Respiratory system: Clear to auscultation. Respiratory effort normal. Cardiovascular system: S1 & S2 heard, RRR. No JVD,  murmurs, rubs, gallops or clicks. No pedal edema. Gastrointestinal system: Abdomen is nondistended, soft and nontender. No organomegaly or masses felt. Normal bowel sounds heard. Central nervous system: Alert and oriented. No focal neurological deficits. Extremities: Left lower extremity: Short leg splint, dressing intact. Skin: No rashes, lesions or ulcers Psychiatry: Judgement and insight appear normal. Mood & affect appropriate.    Data Reviewed: I have personally reviewed following labs and imaging studies  CBC: Recent Labs  Lab 09/07/22 1718 09/08/22 0552 09/09/22 0430  WBC 6.8 5.7 6.5  NEUTROABS 4.9  --   --   HGB 9.8* 8.4* 8.5*  HCT 29.9* 24.3* 24.7*  MCV 101.0* 97.6 97.2  PLT 224 193 [id number]   Basic Metabolic Panel: Recent Labs  Lab 09/07/22 1718 09/07/22 1726 09/07/22 2008 09/08/22 0552  NA 139  --   --  137  K 3.2*  --   --  3.9  CL 101  --   --  102  CO2 27  --   --  26  GLUCOSE 133*  --   --  126*  BUN 20  --   --  16  CREATININE 1.28*  --   --  1.12*  CALCIUM 9.0  --   --  8.3*  MG  --  1.9  --   --   PHOS  --   --  3.0  --    GFR: Estimated Creatinine Clearance: 39.2 mL/min (A) (by C-G formula based on SCr of 1.12 mg/dL (H)). Liver Function Tests: Recent Labs  Lab 09/08/22 0552  AST 14*  ALT 11  ALKPHOS 119  BILITOT 0.4  PROT 5.2*  ALBUMIN 2.8*   No results for input(s): "LIPASE", "AMYLASE" in the last 168 hours. No results for input(s): "AMMONIA" in the last 168 hours. Coagulation Profile: No results for input(s): "INR", "PROTIME" in the last 168 hours. Cardiac Enzymes: Recent Labs  Lab 09/07/22 2008  CKTOTAL 137   BNP (last 3 results) No results for input(s): "PROBNP" in the last 8760 hours. HbA1C: Recent Labs    09/07/22 2008  HGBA1C 6.6*   CBG: Recent Labs  Lab 09/08/22 1214 09/08/22 1611 09/08/22 2111 09/09/22 0015 09/09/22 0514  GLUCAP 135* 188* 173* 197* 180*   Lipid Profile: No results for input(s): "CHOL", "HDL",  "LDLCALC", "TRIG", "CHOLHDL", "LDLDIRECT" in the last 72 hours. Thyroid Function Tests: Recent Labs    09/07/22 2008  TSH 1.962   Anemia Panel: Recent Labs    09/07/22 2008  VITAMINB12 1,312*  FOLATE 10.9  FERRITIN 394*  TIBC 204*  IRON 51  RETICCTPCT 2.2   Sepsis Labs: No results for input(s): "PROCALCITON", "LATICACIDVEN" in the last 168 hours.  No results found for this or any previous visit (from the past 240 hour(s)).    Radiology Studies: VAS [place] LOWER EXTREMITY VENOUS (DVT) (7a-7p)  Result Date: 09/08/2022  Lower Venous DVT Study Patient Name:  AMRYN VESPA  Date of Exam:   09/07/2022 Medical Rec #: [id number]       Accession #:    [id number] Date of Birth: 04-12-1941      Patient Gender: F Patient Age:   89  years Exam Location:  [facility] Procedure:      VAS [place] LOWER EXTREMITY VENOUS (DVT) Referring Phys: Leanord Asal --------------------------------------------------------------------------------  Indications: Right calf erythema, swelling.  Comparison Study: No prior studies. Performing Technologist: Darlin Coco RDMS, RVT  Examination Guidelines: A complete evaluation includes B-mode imaging, spectral Doppler, color Doppler, and power Doppler as needed of all accessible portions of each vessel. Bilateral testing is considered an integral part of a complete examination. Limited examinations for reoccurring indications may be performed as noted. The reflux portion of the exam is performed with the patient in reverse Trendelenburg.  +---------+---------------+---------+-----------+----------+--------------+ RIGHT    CompressibilityPhasicitySpontaneityPropertiesThrombus Aging +---------+---------------+---------+-----------+----------+--------------+ CFV      Full           Yes      Yes                                 +---------+---------------+---------+-----------+----------+--------------+ SFJ      Full                                                         +---------+---------------+---------+-----------+----------+--------------+ FV Prox  Full                                                        +---------+---------------+---------+-----------+----------+--------------+ FV Mid   Full                                                        +---------+---------------+---------+-----------+----------+--------------+ FV DistalFull                                                        +---------+---------------+---------+-----------+----------+--------------+ PFV      Full                                                        +---------+---------------+---------+-----------+----------+--------------+ POP      Full           Yes      Yes                                 +---------+---------------+---------+-----------+----------+--------------+ PTV      Full                                                        +---------+---------------+---------+-----------+----------+--------------+ PERO     Full                                                        +---------+---------------+---------+-----------+----------+--------------+   +----+---------------+---------+-----------+----------+--------------+  LEFTCompressibilityPhasicitySpontaneityPropertiesThrombus Aging +----+---------------+---------+-----------+----------+--------------+ CFV Full           Yes      Yes                                 +----+---------------+---------+-----------+----------+--------------+     Summary: RIGHT: - There is no evidence of deep vein thrombosis in the lower extremity.  - No cystic structure found in the popliteal fossa.  LEFT: - No evidence of common femoral vein obstruction.  *See table(s) above for measurements and observations. Electronically signed by Monica Martinez MD on 09/08/2022 at 12:40:39 PM.    Final    DG Ankle 2 Views Left  Result Date: 09/07/2022 CLINICAL DATA:  Ankle fracture, postreduction.  EXAM: LEFT ANKLE - 2 VIEW COMPARISON:  Preoperative radiograph earlier today. FINDINGS: Improved alignment of bimalleolar fracture postreduction. Overlying splint material limits osseous and soft tissue fine detail. The ankle mortise is currently congruent. No obvious posterior tibial tubercle fracture is seen. IMPRESSION: Improved alignment of bimalleolar fracture postreduction. Electronically Signed   By: Keith Rake M.D.   On: 09/07/2022 15:54   DG Knee Left Port  Result Date: 09/07/2022 CLINICAL DATA:  Ankle fracture.  Fall. EXAM: PORTABLE LEFT KNEE - 1-2 VIEW COMPARISON:  None Available. FINDINGS: There is minimal joint space narrowing of the MEDIAL compartment. No acute fracture or subluxation. No significant joint effusion. No radiopaque foreign body. IMPRESSION: Minimal degenerative changes. No evidence for acute abnormality. Electronically Signed   By: Nolon Nations M.D.   On: 09/07/2022 15:52   CT Head Wo Contrast  Result Date: 09/07/2022 CLINICAL DATA:  Fall EXAM: CT HEAD WITHOUT CONTRAST CT CERVICAL SPINE WITHOUT CONTRAST TECHNIQUE: Multidetector CT imaging of the head and cervical spine was performed following the standard protocol without intravenous contrast. Multiplanar CT image reconstructions of the cervical spine were also generated. RADIATION DOSE REDUCTION: This exam was performed according to the departmental dose-optimization program which includes automated exposure control, adjustment of the mA and/or kV according to patient size and/or use of iterative reconstruction technique. COMPARISON:  CT head and cervical spine 03/08/2018 FINDINGS: CT HEAD FINDINGS Brain: There is no acute intracranial hemorrhage, extra-axial fluid collection, or acute infarct Parenchymal volume is normal for age. The ventricles are normal in size. Gray-white differentiation is preserved. Patchy hypodensity in the supratentorial white matter likely reflects sequela of chronic small-vessel ischemic  change. The pituitary and suprasellar region are normal. There is no mass lesion. There is no mass effect or midline shift. Vascular: There is calcification of the bilateral carotid siphons. Skull: Normal. Negative for fracture or focal lesion. Sinuses/Orbits: The paranasal sinuses are clear. Bilateral lens implants are in place. The globes and orbits are otherwise unremarkable. Other: None. CT CERVICAL SPINE FINDINGS Alignment: Normal. There is no jumped or perched facet or other evidence of traumatic malalignment Skull base and vertebrae: Skull base alignment is maintained. Vertebral body heights are preserved. There is no evidence of acute fracture. There is no suspicious osseous lesion. Soft tissues and spinal canal: No prevertebral fluid or swelling. No visible canal hematoma. Disc levels: There is disc space narrowing and degenerative endplate change most advanced at C5-C6 with prominent anterior osteophytes throughout the remainder of the cervical spine. There is multilevel facet arthropathy most advanced on the left at C3-C4 through C5-C6. There is no evidence of high-grade spinal canal stenosis. Upper chest: There is right worse than left apical scarring. Other: None. IMPRESSION:  1. No acute intracranial pathology. 2. No acute fracture or traumatic malalignment of the cervical spine. Electronically Signed   By: Valetta Mole M.D.   On: 09/07/2022 12:47   CT Cervical Spine Wo Contrast  Result Date: 09/07/2022 CLINICAL DATA:  Fall EXAM: CT HEAD WITHOUT CONTRAST CT CERVICAL SPINE WITHOUT CONTRAST TECHNIQUE: Multidetector CT imaging of the head and cervical spine was performed following the standard protocol without intravenous contrast. Multiplanar CT image reconstructions of the cervical spine were also generated. RADIATION DOSE REDUCTION: This exam was performed according to the departmental dose-optimization program which includes automated exposure control, adjustment of the mA and/or kV according to  patient size and/or use of iterative reconstruction technique. COMPARISON:  CT head and cervical spine 03/08/2018 FINDINGS: CT HEAD FINDINGS Brain: There is no acute intracranial hemorrhage, extra-axial fluid collection, or acute infarct Parenchymal volume is normal for age. The ventricles are normal in size. Gray-white differentiation is preserved. Patchy hypodensity in the supratentorial white matter likely reflects sequela of chronic small-vessel ischemic change. The pituitary and suprasellar region are normal. There is no mass lesion. There is no mass effect or midline shift. Vascular: There is calcification of the bilateral carotid siphons. Skull: Normal. Negative for fracture or focal lesion. Sinuses/Orbits: The paranasal sinuses are clear. Bilateral lens implants are in place. The globes and orbits are otherwise unremarkable. Other: None. CT CERVICAL SPINE FINDINGS Alignment: Normal. There is no jumped or perched facet or other evidence of traumatic malalignment Skull base and vertebrae: Skull base alignment is maintained. Vertebral body heights are preserved. There is no evidence of acute fracture. There is no suspicious osseous lesion. Soft tissues and spinal canal: No prevertebral fluid or swelling. No visible canal hematoma. Disc levels: There is disc space narrowing and degenerative endplate change most advanced at C5-C6 with prominent anterior osteophytes throughout the remainder of the cervical spine. There is multilevel facet arthropathy most advanced on the left at C3-C4 through C5-C6. There is no evidence of high-grade spinal canal stenosis. Upper chest: There is right worse than left apical scarring. Other: None. IMPRESSION: 1. No acute intracranial pathology. 2. No acute fracture or traumatic malalignment of the cervical spine. Electronically Signed   By: Valetta Mole M.D.   On: 09/07/2022 12:47   DG Ankle Complete Left  Result Date: 09/07/2022 CLINICAL DATA:  Trauma, fall EXAM: LEFT ANKLE  COMPLETE - 3+ VIEW COMPARISON:  None Available. FINDINGS: There is bimalleolar fracture dislocation. Comminuted displaced fractures are seen in the base of medial malleolus and the distal fibula. There is lateral displacement of talus in relation to distal tibia. There is abnormal widening of joint space between the distal tibia and talus in the anterior aspect. IMPRESSION: Comminuted displaced fractures are seen in distal tibia and fibula. There is lateral dislocation in the left ankle. Electronically Signed   By: Elmer Picker M.D.   On: 09/07/2022 11:40   DG Pelvis Portable  Result Date: 09/07/2022 CLINICAL DATA:  Trauma, fall EXAM: PORTABLE PELVIS 1-2 VIEWS COMPARISON:  None Available. FINDINGS: No displaced fracture or dislocation is seen. Degenerative changes are noted in lumbar spine. There is previous surgical fusion at the L4-L5 level. IMPRESSION: No fracture or dislocation is seen in pelvis. Electronically Signed   By: Elmer Picker M.D.   On: 09/07/2022 11:38   DG Chest Port 1 View  Result Date: 09/07/2022 CLINICAL DATA:  Status post fall. EXAM: PORTABLE CHEST 1 VIEW COMPARISON:  Radiographs 03/10/2022 and 02/06/2021. FINDINGS: 1122 hours. The heart size and  mediastinal contours are stable. The lungs appear unchanged with mild chronic interstitial prominence. No edema, confluent airspace opacity, pleural effusion or pneumothorax. No acute fractures are identified. There are asymmetric glenohumeral degenerative changes on the left and mild thoracic spondylosis. Telemetry leads overlie the chest. IMPRESSION: No evidence of acute chest injury. Stable mild chronic interstitial prominence. Electronically Signed   By: Richardean Sale M.D.   On: 09/07/2022 11:38    Scheduled Meds:  atorvastatin  40 mg Oral QHS   budesonide (PULMICORT) nebulizer solution  0.25 mg Nebulization BID   cephALEXin  500 mg Oral Q8H   clopidogrel  75 mg Oral q AM   gabapentin  300 mg Oral QPC supper   insulin  aspart  0-9 Units Subcutaneous Q4H   montelukast  10 mg Oral QPC supper   multivitamin with minerals  1 tablet Oral Daily   mupirocin ointment   Topical BID   pantoprazole  40 mg Oral QAC breakfast   Ensure Max Protein  11 oz Oral BID   sertraline  100 mg Oral QPC supper   Continuous Infusions:  methocarbamol (ROBAXIN) IV       LOS: 1 day   Time spent: 35 minutes   Devarious Pavek Loann Quill, MD [facility]  If 7PM-7AM, please contact night-coverage [email or web address] 09/09/2022, 10:53 AM

## 2022-09-09 NOTE — TOC CAGE-AID Note (Signed)
Transition of Care [facility]) - CAGE-AID Screening   Patient Details  Name: REMELL PRUSAK MRN: [id number] Date of Birth: [birth year]   Elvina Sidle, RN Trauma Response Nurse Phone Number: [phone] 09/09/2022, 6:49 PM     CAGE-AID Screening:    Have You Ever Felt You Ought to Cut Down on Your Drinking or Drug Use?: No Have People Annoyed You By Critizing Your Drinking Or Drug Use?: No Have You Felt Bad Or Guilty About Your Drinking Or Drug Use?: No Have You Ever Had a Drink or Used Drugs First Thing In The Morning to Steady Your Nerves or to Get Rid of a Hangover?: No CAGE-AID Score: 0  Substance Abuse Education Offered: No

## 2022-09-09 NOTE — Evaluation (Signed)
Occupational Therapy Evaluation Patient Details Name: Teresa Daniels MRN: [id number] DOB: September 21, 1940 Today's Date: 09/09/2022   History of Present Illness Pt is 82 yo female presenting to ED following a fall. PT hit her head but denies LOC. Deformity and pain in the L ankle. Currently status post reduction of the L ankle with applied splint and conservative management per orthopedics. Pt w/c bound at baseline due to recent lumbar surgery. Pt has 2 broken toes on her L foot. PMH includes recent lumbar surgery in January, asthma, CKD IV, depression, DM II, HTN, and stroke.   Clinical Impression   PTA, pt lived at [place] and performed ADL from wheelchair level. Per pt report, able to perform SPT transfers to chair prior to fall. Upon eval, pt presents with decreased strength, balance, safety, awareness, and pain. Pt performing UB ADL with supervision/set-up and LB ADL with mod-max A. Pt refusing attempts at transfers this session, but able to laterally scoot toward L and R with mod A. Recommending discharge to SNF for continued OT services.      Recommendations for follow up therapy are one component of a multi-disciplinary discharge planning process, led by the attending physician.  Recommendations may be updated based on patient status, additional functional criteria and insurance authorization.   Follow Up Recommendations  Skilled nursing-short term rehab (<3 hours/day)     Assistance Recommended at Discharge Intermittent Supervision/Assistance  Patient can return home with the following A lot of help with walking and/or transfers;A lot of help with bathing/dressing/bathroom;Assistance with cooking/housework;Assist for transportation;Help with stairs or ramp for entrance    Functional Status Assessment  Patient has had a recent decline in their functional status and demonstrates the ability to make significant improvements in function in a reasonable and predictable amount of time.  Equipment  Recommendations  Other (comment) (defer)    Recommendations for Other Services       Precautions / Restrictions Precautions Precautions: Fall Restrictions Weight Bearing Restrictions: Yes LLE Weight Bearing: Non weight bearing Other Position/Activity Restrictions: broken toes on the R foot      Mobility Bed Mobility Overal bed mobility: Needs Assistance Bed Mobility: Supine to Sit, Sit to Supine, Rolling Rolling: Mod assist   Supine to sit: Min assist Sit to supine: Mod assist   General bed mobility comments: Pt requires Min a for RLE. She is able to participate and follow directions. She requires some verbal cues to initiate and encouragement to participate.  Pt was able to scoot EOB at Max A without clearing her buttocks from EOB.    Transfers                          Balance Overall balance assessment: Needs assistance Sitting-balance support: Bilateral upper extremity supported Sitting balance-Leahy Scale: Fair                                     ADL either performed or assessed with clinical judgement   ADL Overall ADL's : Needs assistance/impaired Eating/Feeding: Modified independent;Bed level   Grooming: Set up;Sitting   Upper Body Bathing: Set up;Sitting   Lower Body Bathing: Sitting/lateral leans;Moderate assistance   Upper Body Dressing : Set up;Sitting   Lower Body Dressing: Maximal assistance;Sitting/lateral leans     Toilet Transfer Details (indicate cue type and reason): Pt refused. Able to scoot hips to L and R in bed with mod  a           General ADL Comments: Pt very self limiting, reporting she does not want to move     Vision Ability to See in Adequate Light: 0 Adequate Patient Visual Report: No change from baseline Vision Assessment?: No apparent visual deficits     Perception Perception Perception Tested?: No   Praxis      Pertinent Vitals/Pain Pain Assessment Pain Assessment: Faces Faces Pain  Scale: Hurts whole lot Pain Location: LLE, R toes, RLE Pain Descriptors / Indicators: Grimacing, Guarding Pain Intervention(s): Limited activity within patient's tolerance, Monitored during session, Repositioned     Hand Dominance Right   Extremity/Trunk Assessment Upper Extremity Assessment Upper Extremity Assessment: Overall WFL for tasks assessed   Lower Extremity Assessment Lower Extremity Assessment: Defer to PT evaluation   Cervical / Trunk Assessment Cervical / Trunk Assessment: Kyphotic   Communication Communication Communication: No difficulties   Cognition Arousal/Alertness: Awake/alert Behavior During Therapy: WFL for tasks assessed/performed Overall Cognitive Status: Within Functional Limits for tasks assessed                                 General Comments: Pt is not aware of extent of deficits, and exhibits frequent self limiting behavior.     General Comments       Exercises     Shoulder Instructions      Home Living Family/patient expects to be discharged to:: Skilled nursing facility                                 Additional Comments: Lives at [place] [facility]      Prior Functioning/Environment Prior Level of Function : Needs assist             Mobility Comments: Pt was at W/C level prior to hospitalization and was performing transfers at Mod I. ADLs Comments: Mod I for bathing, dressing and toileting. Meals and cleaning provided at facility.        OT Problem List: Decreased strength;Decreased activity tolerance;Impaired balance (sitting and/or standing);Decreased range of motion;Decreased safety awareness;Decreased knowledge of use of DME or AE;Decreased knowledge of precautions;Pain      OT Treatment/Interventions: Self-care/ADL training;Therapeutic exercise;DME and/or AE instruction;Therapeutic activities;Patient/family education;Balance training    OT Goals(Current goals can be found in the care plan  section) Acute Rehab OT Goals Patient Stated Goal: rest OT Goal Formulation: With patient Time For Goal Achievement: 09/23/22 Potential to Achieve Goals: Good  OT Frequency: Min 2X/week    Co-evaluation              AM-PAC OT "6 Clicks" Daily Activity     Outcome Measure Help from another person eating meals?: None Help from another person taking care of personal grooming?: None Help from another person toileting, which includes using toliet, bedpan, or urinal?: Total Help from another person bathing (including washing, rinsing, drying)?: A Lot Help from another person to put on and taking off regular upper body clothing?: A Little Help from another person to put on and taking off regular lower body clothing?: A Lot 6 Click Score: 16   End of Session Nurse Communication: Mobility status  Activity Tolerance: Patient tolerated treatment well Patient left: in bed;with call bell/phone within reach  OT Visit Diagnosis: Unsteadiness on feet (R26.81);Muscle weakness (generalized) (M62.81);Other abnormalities of gait and mobility (R26.89);Repeated falls (R29.6);History of  falling (Z91.81);Pain Pain - part of body:  (BLE)                Time: [id number] OT Time Calculation (min): 25 min Charges:  OT General Charges $OT Visit: 1 Visit OT Evaluation $OT Eval Low Complexity: 1 Low OT Treatments $Self Care/Home Management : 8-22 mins  Elder Cyphers, OTR/L [facility] Acute Rehabilitation Office: [phone]   Magnus Ivan 09/09/2022, 2:07 PM

## 2022-09-09 NOTE — Progress Notes (Signed)
Physical Therapy Treatment Patient Details Name: Teresa Daniels MRN: [id number] DOB: January 03, 1941 Today's Date: 09/09/2022   History of Present Illness Pt is 82 yo female presenting to ED following a fall. PT hit her head but denies LOC. Deformity and pain in the L ankle. Currently status post reduction of the L ankle with applied splint and conservative management per orthopedics. Pt w/c bound at baseline due to recent lumbar surgery. Pt has 2 broken toes on her L foot. PMH includes recent lumbar surgery in January, asthma, CKD IV, depression, DM II, HTN, and stroke.    PT Comments    Patient agrees to LE exercises (recently sat at EOB with OT). Requires AAROM for RLE due to weakness. LLE limited by incr pain.    Recommendations for follow up therapy are one component of a multi-disciplinary discharge planning process, led by the attending physician.  Recommendations may be updated based on patient status, additional functional criteria and insurance authorization.  Follow Up Recommendations  Skilled nursing-short term rehab (<3 hours/day) Can patient physically be transported by private vehicle: No   Assistance Recommended at Discharge Frequent or constant Supervision/Assistance  Patient can return home with the following Two people to help with walking and/or transfers;Assist for transportation;Help with stairs or ramp for entrance;Assistance with cooking/housework   Equipment Recommendations  Other (comment) (Defer to post acute)    Recommendations for Other Services       Precautions / Restrictions Precautions Precautions: Fall Restrictions Weight Bearing Restrictions: Yes LLE Weight Bearing: Non weight bearing Other Position/Activity Restrictions: broken toes on the R foot     Mobility  Bed Mobility               General bed mobility comments: Patient recently worked with OT on EOB and did not want to sit EOB again.    Transfers                         Ambulation/Gait                   Stairs             Wheelchair Mobility    Modified Rankin (Stroke Patients Only)       Balance                                            Cognition Arousal/Alertness: Awake/alert Behavior During Therapy: WFL for tasks assessed/performed Overall Cognitive Status: Within Functional Limits for tasks assessed                                          Exercises General Exercises - Lower Extremity Ankle Circles/Pumps: AROM, Right, 10 reps Short Arc Quad: AROM, Both, 5 reps, 10 reps Heel Slides: AAROM, Right, 10 reps Hip ABduction/ADduction: AAROM, Right, 10 reps Straight Leg Raises: AAROM, Right, 5 reps Other Exercises Other Exercises: right hip extension x 10--supine with knee flexed over bolster (semi-bridging)    General Comments        Pertinent Vitals/Pain Pain Assessment Pain Assessment: Faces Faces Pain Scale: Hurts whole lot Pain Location: LLE Pain Descriptors / Indicators: Grimacing, Guarding Pain Intervention(s): Limited activity within patient's tolerance    Home Living Family/patient expects to be discharged to::  Skilled nursing facility                   Additional Comments: Lives at [place]    Prior Function            PT Goals (current goals can now be found in the care plan section) Acute Rehab PT Goals Patient Stated Goal: Pt would like to go back home. Time For Goal Achievement: 09/22/22 Potential to Achieve Goals: Fair Progress towards PT goals: Progressing toward goals    Frequency    Min 3X/week      PT Plan Current plan remains appropriate    Co-evaluation              AM-PAC PT "6 Clicks" Mobility   Outcome Measure  Help needed turning from your back to your side while in a flat bed without using bedrails?: A Lot Help needed moving from lying on your back to sitting on the side of a flat bed without using  bedrails?: A Lot Help needed moving to and from a bed to a chair (including a wheelchair)?: Total Help needed standing up from a chair using your arms (e.g., wheelchair or bedside chair)?: Total Help needed to walk in hospital room?: Total Help needed climbing 3-5 steps with a railing? : Total 6 Click Score: 8    End of Session   Activity Tolerance: Patient limited by pain Patient left: in bed;with call bell/phone within reach;with bed alarm set   PT Visit Diagnosis: Other abnormalities of gait and mobility (R26.89);History of falling (Z91.81)     Time: [id number] PT Time Calculation (min) (ACUTE ONLY): 9 min  Charges:  $Therapeutic Exercise: 8-22 mins                      Arby Barrette, PT Acute Rehabilitation Services  Office [phone]    Rexanne Mano 09/09/2022, 3:06 PM

## 2022-09-10 DIAGNOSIS — S82892A Other fracture of left lower leg, initial encounter for closed fracture: Secondary | ICD-10-CM | POA: Diagnosis not present

## 2022-09-10 LAB — CBC
HCT: 24.4 % — ABNORMAL LOW (ref 36.0–46.0)
Hemoglobin: 8.1 g/dL — ABNORMAL LOW (ref 12.0–15.0)
MCH: 33.1 pg (ref 26.0–34.0)
MCHC: 33.2 g/dL (ref 30.0–36.0)
MCV: 99.6 fL (ref 80.0–100.0)
Platelets: 215 10*3/uL (ref 150–400)
RBC: 2.45 MIL/uL — ABNORMAL LOW (ref 3.87–5.11)
RDW: 12.9 % (ref 11.5–15.5)
WBC: 5.7 10*3/uL (ref 4.0–10.5)
nRBC: 0 % (ref 0.0–0.2)

## 2022-09-10 LAB — BASIC METABOLIC PANEL
Anion gap: 9 (ref 5–15)
BUN: 19 mg/dL (ref 8–23)
CO2: 28 mmol/L (ref 22–32)
Calcium: 8.6 mg/dL — ABNORMAL LOW (ref 8.9–10.3)
Chloride: 100 mmol/L (ref 98–111)
Creatinine, Ser: 1.08 mg/dL — ABNORMAL HIGH (ref 0.44–1.00)
GFR, Estimated: 52 mL/min — ABNORMAL LOW (ref >60)
Glucose, Bld: 153 mg/dL — ABNORMAL HIGH (ref 70–99)
Potassium: 4 mmol/L (ref 3.5–5.1)
Sodium: 137 mmol/L (ref 135–145)

## 2022-09-10 LAB — GLUCOSE, CAPILLARY
Glucose-Capillary: 138 mg/dL — ABNORMAL HIGH (ref 70–99)
Glucose-Capillary: 162 mg/dL — ABNORMAL HIGH (ref 70–99)
Glucose-Capillary: 163 mg/dL — ABNORMAL HIGH (ref 70–99)
Glucose-Capillary: 162 mg/dL — ABNORMAL HIGH (ref 70–99)
Glucose-Capillary: 157 mg/dL — ABNORMAL HIGH (ref 70–99)

## 2022-09-10 NOTE — Progress Notes (Signed)
PROGRESS NOTE    Teresa Daniels  [id number] DOB: 11/23/1940 DOA: 09/07/2022 PCP: [name], PA-C   Brief Narrative:  Patient is a 82 year old female with history of diabetes type 2, CKD, anemia, hyperlipidemia, spondylolisthesis, laminectomy who presented from independent living facility with complaint of fall and severe left ankle pain.  On presentation ,she was noted to have swelling, deformity and tenderness of the left ankle. She was recently fell and had laceration of the right foot with fracture of the small toe. x-ray showed bimalleolar fracture with dislocation of the left ankle.  Orthopedics consulted and recommended outpatient follow-up in a week to discuss about ORIF.  PT/OT consulted for evaluation for SNF.  If patient still remains here till Monday, orthopedics planning for ORIF   Assessment & Plan:   Left ankle bimalleolar fracture:  -history of recurrent falls.  Found to have closed bimalleolar fracture of left ankle.  Orthopedics already consulted and saw the patient. Applied splint. Recommended outpatient follow-up in a week to discuss about ORIF.  Recommended to be nonweightbearing on the left lower extremity. - Continue pain management, supportive care. -PT/OT recommended SNF -Ortho recommended ORIF likely on Tuesday if patient remains in the hospital -TOC consulted-SNF-no bed available until Monday   Right foot Cellulitis/wound : She recently fell and fractured her right small toe.  Right toenail has fallen.  Wound looks good.  Was taking Augmentin as an outpatient.   -Changed to Rocephin on admission, transition to Keflex 130/1/24 -Wound care  consulted   CKD stage IIIa: Currently kidney function at baseline.     History of chronic back pain/lumbar stenosis: Currently mostly wheelchair-bound.  Status post laminectomy.   Hypokalemia: Resolved  History of combined systolic/diastolic CHF: Found to be dehydrated on presentation.  Started on gentle IV fluids,now  stopped.  Last echo showed EF of 50 to [id number], grade 1 diastolic dysfunction. -Currently she looks euvolemic   Chronic asthma: Currently stable.  Continue home inhalers, bronchodilators, Singulair   Chronic normocytic anemia: Currently hemoglobin in the range of 8-9.  She states she is chronically anemic.  Denies any hematochezia or melena.  Iron studies showed optimal iron level   History of stroke: On Plavix daily.  Has mild right-sided residual deficits   Diabetes type 2: Currently on sliding scale.  Monitor blood sugars  Hyperlipidemia: Continue atorvastatin  GERD: Continue PPI  Depression anxiety: Continue Zoloft  Peripheral neuropathy: Continue gabapentin  DVT prophylaxis: SCD, heparin Code Status: Full code Family Communication:  None present at bedside.  Plan of care discussed with patient in length and she verbalized understanding and agreed with it. Disposition Plan: SNF  Consultants:  Ortho  Procedures:  None  Antimicrobials:  Keflex    Subjective: Patient seen and examined.  Sitting comfortably on the bed.  Eating breakfast.  Denies any complaints.  Remained afebrile.  No acute events overnight.    Objective: Vitals:   09/09/22 2036 09/10/22 0402 09/10/22 0820 09/10/22 0844  BP: 135/68 (!) 107/52  132/61  Pulse: 78 85  83  Resp: 18   19  Temp: 98.3 F (36.8 C) 98.3 F (36.8 C)  98.5 F (36.9 C)  TempSrc: Oral   Oral  SpO2: 98% 97% 97% 92%  Weight:      Height:        Intake/Output Summary (Last 24 hours) at 09/10/2022 0924 Last data filed at 09/09/2022 2200 Gross per 24 hour  Intake 717 ml  Output 100 ml  Net 617 ml  Filed Weights   09/07/22 1114  Weight: 72 kg    Examination:  General exam: Appears calm and comfortable, on room air, communicating well, pleasant elderly female, eating breakfast. Respiratory system: Clear to auscultation. Respiratory effort normal. Cardiovascular system: S1 & S2 heard, RRR. No JVD, murmurs, rubs, gallops or  clicks. No pedal edema. Gastrointestinal system: Abdomen is nondistended, soft and nontender. No organomegaly or masses felt. Normal bowel sounds heard. Central nervous system: Alert and oriented. No focal neurological deficits. Extremities: Left lower extremity: Short leg splint, dressing intact. Skin: No rashes, lesions or ulcers Psychiatry: Judgement and insight appear normal. Mood & affect appropriate.    Data Reviewed: I have personally reviewed following labs and imaging studies  CBC: Recent Labs  Lab 09/07/22 1718 09/08/22 0552 09/09/22 0430 09/10/22 0430  WBC 6.8 5.7 6.5 5.7  NEUTROABS 4.9  --   --   --   HGB 9.8* 8.4* 8.5* 8.1*  HCT 29.9* 24.3* 24.7* 24.4*  MCV 101.0* 97.6 97.2 99.6  PLT 224 193 209 [id number]    Basic Metabolic Panel: Recent Labs  Lab 09/07/22 1718 09/07/22 1726 09/07/22 2008 09/08/22 0552 09/10/22 0430  NA 139  --   --  137 137  K 3.2*  --   --  3.9 4.0  CL 101  --   --  102 100  CO2 27  --   --  26 28  GLUCOSE 133*  --   --  126* 153*  BUN 20  --   --  16 19  CREATININE 1.28*  --   --  1.12* 1.08*  CALCIUM 9.0  --   --  8.3* 8.6*  MG  --  1.9  --   --   --   PHOS  --   --  3.0  --   --     GFR: Estimated Creatinine Clearance: 40.6 mL/min (A) (by C-G formula based on SCr of 1.08 mg/dL (H)). Liver Function Tests: Recent Labs  Lab 09/08/22 0552  AST 14*  ALT 11  ALKPHOS 119  BILITOT 0.4  PROT 5.2*  ALBUMIN 2.8*    No results for input(s): "LIPASE", "AMYLASE" in the last 168 hours. No results for input(s): "AMMONIA" in the last 168 hours. Coagulation Profile: No results for input(s): "INR", "PROTIME" in the last 168 hours. Cardiac Enzymes: Recent Labs  Lab 09/07/22 2008  CKTOTAL 137    BNP (last 3 results) No results for input(s): "PROBNP" in the last 8760 hours. HbA1C: Recent Labs    09/07/22 2008  HGBA1C 6.6*    CBG: Recent Labs  Lab 09/09/22 1643 09/09/22 2039 09/09/22 2358 09/10/22 0359 09/10/22 0832  GLUCAP  173* 178* 170* 138* 162*    Lipid Profile: No results for input(s): "CHOL", "HDL", "LDLCALC", "TRIG", "CHOLHDL", "LDLDIRECT" in the last 72 hours. Thyroid Function Tests: Recent Labs    09/07/22 2008  TSH 1.962    Anemia Panel: Recent Labs    09/07/22 2008  VITAMINB12 1,312*  FOLATE 10.9  FERRITIN 394*  TIBC 204*  IRON 51  RETICCTPCT 2.2    Sepsis Labs: No results for input(s): "PROCALCITON", "LATICACIDVEN" in the last 168 hours.  No results found for this or any previous visit (from the past 240 hour(s)).    Radiology Studies: No results found.  Scheduled Meds:  atorvastatin  40 mg Oral QHS   budesonide (PULMICORT) nebulizer solution  0.25 mg Nebulization BID   cephALEXin  500 mg Oral Q8H   clopidogrel  75 mg Oral q AM   gabapentin  300 mg Oral QPC supper   heparin injection (subcutaneous)  5,000 Units Subcutaneous Q8H   insulin aspart  0-9 Units Subcutaneous Q4H   montelukast  10 mg Oral QPC supper   multivitamin with minerals  1 tablet Oral Daily   mupirocin ointment   Topical BID   pantoprazole  40 mg Oral QAC breakfast   Ensure Max Protein  11 oz Oral BID   sertraline  100 mg Oral QPC supper   Continuous Infusions:  methocarbamol (ROBAXIN) IV       LOS: 1 day   Time spent: 35 minutes   Jordyne Poehlman Loann Quill, MD [facility]  If 7PM-7AM, please contact night-coverage [email or web address] 09/10/2022, 9:24 AM

## 2022-09-11 ENCOUNTER — Observation Stay (HOSPITAL_COMMUNITY): Payer: Medicare PPO

## 2022-09-11 DIAGNOSIS — E785 Hyperlipidemia, unspecified: Secondary | ICD-10-CM | POA: Diagnosis present

## 2022-09-11 DIAGNOSIS — L03115 Cellulitis of right lower limb: Secondary | ICD-10-CM | POA: Diagnosis present

## 2022-09-11 DIAGNOSIS — N189 Chronic kidney disease, unspecified: Secondary | ICD-10-CM | POA: Diagnosis not present

## 2022-09-11 DIAGNOSIS — S82899A Other fracture of unspecified lower leg, initial encounter for closed fracture: Secondary | ICD-10-CM | POA: Diagnosis present

## 2022-09-11 DIAGNOSIS — W19XXXD Unspecified fall, subsequent encounter: Secondary | ICD-10-CM | POA: Diagnosis present

## 2022-09-11 DIAGNOSIS — I130 Hypertensive heart and chronic kidney disease with heart failure and stage 1 through stage 4 chronic kidney disease, or unspecified chronic kidney disease: Secondary | ICD-10-CM | POA: Diagnosis present

## 2022-09-11 DIAGNOSIS — M80072A Age-related osteoporosis with current pathological fracture, left ankle and foot, initial encounter for fracture: Secondary | ICD-10-CM | POA: Diagnosis present

## 2022-09-11 DIAGNOSIS — K219 Gastro-esophageal reflux disease without esophagitis: Secondary | ICD-10-CM | POA: Diagnosis present

## 2022-09-11 DIAGNOSIS — E1122 Type 2 diabetes mellitus with diabetic chronic kidney disease: Secondary | ICD-10-CM | POA: Diagnosis present

## 2022-09-11 DIAGNOSIS — E1142 Type 2 diabetes mellitus with diabetic polyneuropathy: Secondary | ICD-10-CM | POA: Diagnosis present

## 2022-09-11 DIAGNOSIS — D62 Acute posthemorrhagic anemia: Secondary | ICD-10-CM | POA: Diagnosis not present

## 2022-09-11 DIAGNOSIS — E876 Hypokalemia: Secondary | ICD-10-CM | POA: Diagnosis present

## 2022-09-11 DIAGNOSIS — S82892A Other fracture of left lower leg, initial encounter for closed fracture: Secondary | ICD-10-CM | POA: Diagnosis not present

## 2022-09-11 DIAGNOSIS — I5042 Chronic combined systolic (congestive) and diastolic (congestive) heart failure: Secondary | ICD-10-CM | POA: Diagnosis present

## 2022-09-11 DIAGNOSIS — E860 Dehydration: Secondary | ICD-10-CM | POA: Diagnosis present

## 2022-09-11 DIAGNOSIS — W010XXA Fall on same level from slipping, tripping and stumbling without subsequent striking against object, initial encounter: Secondary | ICD-10-CM | POA: Diagnosis present

## 2022-09-11 DIAGNOSIS — Z993 Dependence on wheelchair: Secondary | ICD-10-CM | POA: Diagnosis not present

## 2022-09-11 DIAGNOSIS — F32A Depression, unspecified: Secondary | ICD-10-CM | POA: Diagnosis present

## 2022-09-11 DIAGNOSIS — I69393 Ataxia following cerebral infarction: Secondary | ICD-10-CM | POA: Diagnosis not present

## 2022-09-11 DIAGNOSIS — N1831 Chronic kidney disease, stage 3a: Secondary | ICD-10-CM | POA: Diagnosis present

## 2022-09-11 DIAGNOSIS — J45909 Unspecified asthma, uncomplicated: Secondary | ICD-10-CM | POA: Diagnosis present

## 2022-09-11 DIAGNOSIS — I129 Hypertensive chronic kidney disease with stage 1 through stage 4 chronic kidney disease, or unspecified chronic kidney disease: Secondary | ICD-10-CM | POA: Diagnosis not present

## 2022-09-11 DIAGNOSIS — Z87891 Personal history of nicotine dependence: Secondary | ICD-10-CM | POA: Diagnosis not present

## 2022-09-11 DIAGNOSIS — D631 Anemia in chronic kidney disease: Secondary | ICD-10-CM | POA: Diagnosis present

## 2022-09-11 LAB — GLUCOSE, CAPILLARY
Glucose-Capillary: 143 mg/dL — ABNORMAL HIGH (ref 70–99)
Glucose-Capillary: 130 mg/dL — ABNORMAL HIGH (ref 70–99)
Glucose-Capillary: 166 mg/dL — ABNORMAL HIGH (ref 70–99)
Glucose-Capillary: 148 mg/dL — ABNORMAL HIGH (ref 70–99)
Glucose-Capillary: 173 mg/dL — ABNORMAL HIGH (ref 70–99)

## 2022-09-11 LAB — BASIC METABOLIC PANEL
Anion gap: 8 (ref 5–15)
BUN: 18 mg/dL (ref 8–23)
CO2: 27 mmol/L (ref 22–32)
Calcium: 8.8 mg/dL — ABNORMAL LOW (ref 8.9–10.3)
Chloride: 102 mmol/L (ref 98–111)
Creatinine, Ser: 1.21 mg/dL — ABNORMAL HIGH (ref 0.44–1.00)
GFR, Estimated: 45 mL/min — ABNORMAL LOW (ref >60)
Glucose, Bld: 156 mg/dL — ABNORMAL HIGH (ref 70–99)
Potassium: 4.1 mmol/L (ref 3.5–5.1)
Sodium: 137 mmol/L (ref 135–145)

## 2022-09-11 LAB — CBC
HCT: 24 % — ABNORMAL LOW (ref 36.0–46.0)
Hemoglobin: 7.8 g/dL — ABNORMAL LOW (ref 12.0–15.0)
MCH: 32.6 pg (ref 26.0–34.0)
MCHC: 32.5 g/dL (ref 30.0–36.0)
MCV: 100.4 fL — ABNORMAL HIGH (ref 80.0–100.0)
Platelets: 230 10*3/uL (ref 150–400)
RBC: 2.39 MIL/uL — ABNORMAL LOW (ref 3.87–5.11)
RDW: 12.8 % (ref 11.5–15.5)
WBC: 6 10*3/uL (ref 4.0–10.5)
nRBC: 0 % (ref 0.0–0.2)

## 2022-09-11 MED ORDER — INSULIN ASPART 100 UNIT/ML IJ SOLN: 0.0000 [IU] | Freq: Every day | INTRAMUSCULAR | Status: DC

## 2022-09-11 MED ORDER — INSULIN ASPART 100 UNIT/ML IJ SOLN
0.0000 [IU] | Freq: Three times a day (TID) | INTRAMUSCULAR | Status: DC
  Administered 2022-09-11 – 2022-09-12 (×3): 2 [IU] via SUBCUTANEOUS
  Administered 2022-09-12 – 2022-09-13 (×2): 1 [IU] via SUBCUTANEOUS
  Administered 2022-09-13 – 2022-09-14 (×3): 2 [IU] via SUBCUTANEOUS
  Administered 2022-09-14 – 2022-09-15 (×4): 1 [IU] via SUBCUTANEOUS

## 2022-09-11 MED ORDER — CEFAZOLIN SODIUM-DEXTROSE 2-4 GM/100ML-% IV SOLN
2.0000 g | Freq: Once | INTRAVENOUS | Status: AC
  Administered 2022-09-12: 2 g via INTRAVENOUS
  Filled 2022-09-11: qty 100

## 2022-09-11 MED ORDER — ACETAMINOPHEN 500 MG PO TABS
1000.0000 mg | ORAL_TABLET | Freq: Three times a day (TID) | ORAL | Status: DC
  Administered 2022-09-11 – 2022-09-15 (×13): 1000 mg via ORAL
  Filled 2022-09-11 (×14): qty 2

## 2022-09-11 NOTE — Progress Notes (Signed)
PROGRESS NOTE  Teresa Daniels  DOB: 1941/05/02  PCP: [name], PA-C [id number]  DOA: 09/07/2022  LOS: 0 days  Hospital Day: 5  Brief narrative: Teresa Daniels is a 82 y.o. female with PMH significant for DM2, HTN, HLD, stroke, CKD, peripheral neuropathy, GERD, chronic anemia, depression, spondylolisthesis s/p laminectomy who lives in an independent living facility.  She had a recent fall and had laceration of the right foot with fracture of the small toe.  2/29, patient was brought to the ED after a fall fall and severe left ankle pain.   She was noted to have swelling, deformity and tenderness of the left ankle. X-ray showed bimalleolar fracture with dislocation of the left ankle.   Orthopedics consulted and recommended outpatient follow-up in a week to discuss about ORIF.   PT/OT recommended SNF and hence patient could not be discharged.  Subjective: Patient was seen and examined this afternoon.  Lying down in bed.  Not in distress with no new symptoms. Chart reviewed Remains hemodynamically stable, afebrile Last set of labs from this morning showed creatinine 1.21, hemoglobin 7.8 with MCV 100.4  Assessment and plan: Left ankle bimalleolar fracture Secondary to a fall.   X-ray as above.   Orthopedics consulted.  Tentative plan of surgery tomorrow. Continue pain management with scheduled Tylenol, as needed oxycodone and as needed IV morphine  Recent right foot small toe fracture She recently fell and fractured her right small toe.  Right toenail has fallen.  Wound looks good.  Was taking Augmentin as an outpatient.  It was changed to Rocephin on admission, transition to Keflex on 09/08/2022  Recurrent falls Multifactorial : Deficits due to stroke, peripheral neuropathy, h/o chronic back pain/lumbar stenosis s/p laminectomy Currently mostly wheelchair-bound. SNF recommended by PT  Type 2 diabetes mellitus Peripheral neuropathy A1c 6.6 on 09/07/2022 PTA on glipizide 2.5  mg daily Currently on sliding scale insulin with Accu-Cheks. Continue Neurontin Recent Labs  Lab 09/10/22 1648 09/10/22 2322 09/11/22 0459 09/11/22 0840 09/11/22 1125  GLUCAP 162* 157* 143* 130* 166*   H/o stroke HLD Continue Plavix and statin  CKD stage IIIa Currently kidney function at baseline.   Recent Labs    01/25/22 1033 03/09/22 1239 03/11/22 0042 07/27/22 1150 09/07/22 1718 09/08/22 0552 09/10/22 0430 09/11/22 0257  BUN 34* 35* 40*  --  '20 16 19 18  '$ CREATININE 1.69* 1.67* 1.59* 1.64* 1.28* 1.12* 1.08* 1.21*   H/o combined systolic/diastolic CHF Essential hypertension Found to be dehydrated on presentation and was given gentle IV hydration.  Currently euvolemic. Most recent echo from 02/2022 showed EF of 50 to [id number], grade 1 diastolic dysfunction.  PTA on HCTZ 25 mg daily, losartan 50 mg daily   Chronic asthma Currently respiratory status is stable.  Continue home inhalers, bronchodilators, Singulair   Chronic macrocytic anemia Hemoglobin at baseline between 8 and 9.   Hemoglobin remains stable. Continue vitamin B12, iron supplement Recent Labs    09/07/22 1718 09/07/22 2008 09/08/22 0552 09/09/22 0430 09/10/22 0430 09/11/22 0257  HGB 9.8*  --  8.4* 8.5* 8.1* 7.8*  MCV 101.0*  --  97.6 97.2 99.6 100.4*  VITAMINB12  --  1,312*  --   --   --   --   FOLATE  --  10.9  --   --   --   --   FERRITIN  --  394*  --   --   --   --   TIBC  --  204*  --   --   --   --  IRON  --  51  --   --   --   --   RETICCTPCT  --  2.2  --   --   --   --    GERD Continue PPI   Depression/anxiety Continue Zoloft    Mobility: Needs PT eval postprocedure  Goals of care   Code Status: Full Code    DVT prophylaxis: Since she is planned for surgery tomorrow.  I would stop heparin at this time and continue SCDs. SCDs Start: 09/07/22 1951   Antimicrobials: Perioperative antibiotics Fluid: Currently none Consultants: Orthopedics Family Communication: None at  bedside  Status is: Observation Level of care: Telemetry Medical   Dispo: Patient is from: Home              Anticipated d/c is to: Pending clinical course Continue in-hospital care because: Pending surgery   Scheduled Meds:  acetaminophen  1,000 mg Oral TID   atorvastatin  40 mg Oral QHS   budesonide (PULMICORT) nebulizer solution  0.25 mg Nebulization BID   cephALEXin  500 mg Oral Q8H   clopidogrel  75 mg Oral q AM   gabapentin  300 mg Oral QPC supper   insulin aspart  0-5 Units Subcutaneous QHS   insulin aspart  0-9 Units Subcutaneous TID WC   montelukast  10 mg Oral QPC supper   multivitamin with minerals  1 tablet Oral Daily   mupirocin ointment   Topical BID   pantoprazole  40 mg Oral QAC breakfast   Ensure Max Protein  11 oz Oral BID   sertraline  100 mg Oral QPC supper    PRN meds: methocarbamol **OR** methocarbamol (ROBAXIN) IV, morphine injection, oxyCODONE   Infusions:   methocarbamol (ROBAXIN) IV      Diet:  Diet Order             Diet NPO time specified Except for: Sips with Meds  Diet effective midnight           Diet Carb Modified Fluid consistency: Thin; Room service appropriate? Yes  Diet effective now                   Antimicrobials: Anti-infectives (From admission, onward)    Start     Dose/Rate Route Frequency Ordered Stop   09/08/22 1400  cephALEXin (KEFLEX) capsule 500 mg        500 mg Oral Every 8 hours 09/08/22 1152 09/13/22 1359   09/07/22 2230  cefTRIAXone (ROCEPHIN) 1 g in sodium chloride 0.9 % 100 mL IVPB  Status:  Discontinued        1 g 200 mL/hr over 30 Minutes Intravenous Every 24 hours 09/07/22 2132 09/08/22 1152       Skin assessment:       Nutritional status:  Body mass index is 26.41 kg/m.  Nutrition Problem: Increased nutrient needs Etiology: hip fracture Signs/Symptoms: estimated needs     Objective: Vitals:   09/11/22 0452 09/11/22 0842  BP: (!) 131/53 122/68  Pulse: 74 90  Resp:  16  Temp:   98.3 F (36.8 C)  SpO2: 95% 96%    Intake/Output Summary (Last 24 hours) at 09/11/2022 1451 Last data filed at 09/11/2022 0842 Gross per 24 hour  Intake --  Output 1600 ml  Net -1600 ml   Filed Weights   09/07/22 1114  Weight: 72 kg   Weight change:  Body mass index is 26.41 kg/m.   Physical Exam: General exam: Pleasant elderly Caucasian female.  Skin: No rashes, lesions or ulcers. HEENT: Atraumatic, normocephalic, no obvious bleeding Lungs: Clear to auscultation bilaterally CVS: Regular rate and rhythm, no murmur GI/Abd soft, nontender, nondistended, bowel sound present.   CNS: Alert, awake, oriented x 3 Psychiatry: Mood appropriate Extremities: No pedal edema, no calf tenderness, left ankle fracture on a splint  Data Review: I have personally reviewed the laboratory data and studies available.  F/u labs ordered Unresulted Labs (From admission, onward)     Start     Ordered   09/12/22 0500  CBC with Differential/Platelet  Tomorrow morning,   R        09/11/22 1451   09/12/22 [id number]  Basic metabolic panel  Tomorrow morning,   R        09/11/22 1451   09/12/22 0500  Type and screen [place]  Once,   R       Comments: [place]    09/11/22 1451            Total time spent in review of labs and imaging, patient evaluation, formulation of plan, documentation and communication with family: 69 minutes  Signed, Terrilee Croak, MD [facility] 09/11/2022

## 2022-09-11 NOTE — Progress Notes (Signed)
Physical Therapy Treatment Patient Details Name: Teresa Daniels MRN: [id number] DOB: July 29, 1940 Today's Date: 09/11/2022   History of Present Illness Pt is 82 yo female presenting to ED following a fall. PT hit her head but denies LOC. Deformity and pain in the L ankle. Currently status post reduction of the L ankle with applied splint and conservative management per orthopedics. Pt w/c bound at baseline due to recent lumbar surgery. Pt has 2 broken toes on her L foot. PMH includes recent lumbar surgery in January, asthma, CKD IV, depression, DM II, HTN, and stroke.    PT Comments    Pt is progressing towards goals. She was more agreeable for functional mobility training this session from her last session. Pt was Max A for sliding board transfer and Min A for supine to sitting EOB due to difficulty lifting the RLE. Due to pt current functional status, home set up and available assistance at home recommending skilled physical therapy services at higher level of care on discharge from acute care hospital setting with 24/7 physical assistance in order to decrease risk for immobility, falls, injury, skin break down and re-hospitalization. Pt demonstrates no signs/symptoms of cardiac/respiratory distress throughout session.     Recommendations for follow up therapy are one component of a multi-disciplinary discharge planning process, led by the attending physician.  Recommendations may be updated based on patient status, additional functional criteria and insurance authorization.  Follow Up Recommendations  Skilled nursing-short term rehab (<3 hours/day) Can patient physically be transported by private vehicle: No   Assistance Recommended at Discharge Frequent or constant Supervision/Assistance  Patient can return home with the following Two people to help with walking and/or transfers;Assist for transportation;Help with stairs or ramp for entrance;Assistance with cooking/housework   Equipment  Recommendations  Other (comment) (defer to post acute)    Recommendations for Other Services       Precautions / Restrictions Precautions Precautions: Fall Restrictions Weight Bearing Restrictions: Yes LLE Weight Bearing: Non weight bearing Other Position/Activity Restrictions: broken toes on the R foot     Mobility  Bed Mobility Overal bed mobility: Needs Assistance Bed Mobility: Supine to Sit     Supine to sit: Min assist, HOB elevated     General bed mobility comments: Min a with the RLE due to pain from previous back pain. Pt was left in recliner at end of session. Patient Response: Cooperative  Transfers Overall transfer level: Needs assistance Equipment used: Sliding board Transfers: Bed to chair/wheelchair/BSC            Lateral/Scoot Transfers: Max assist General transfer comment: Pt was able to WB through her RLE but did grimace. She as able to scoot with Max A laterally from EOB to drop arm recliner utilizing a sliding board then assist with adjusting herself in recliner.    Ambulation/Gait               General Gait Details: unable to progress today due to current functional mobility       Balance Overall balance assessment: Needs assistance Sitting-balance support: Bilateral upper extremity supported Sitting balance-Leahy Scale: Fair Sitting balance - Comments: no LOB, during dynamic scooting pt had no significant directional LOB.          Cognition Arousal/Alertness: Awake/alert Behavior During Therapy: WFL for tasks assessed/performed Overall Cognitive Status: Within Functional Limits for tasks assessed      Exercises      General Comments General comments (skin integrity, edema, etc.): pt was left in recliner  at end of session with lift pad under pt for nursing to return to bed when pt is ready. Recommended she stay up for lunch.      Pertinent Vitals/Pain Pain Assessment Pain Assessment: Faces Faces Pain Scale: Hurts even  more Pain Location: RLE with WB at the toes Pain Descriptors / Indicators: Grimacing, Guarding Pain Intervention(s): Monitored during session     PT Goals (current goals can now be found in the care plan section) Acute Rehab PT Goals Patient Stated Goal: Pt would like to go back home. PT Goal Formulation: With patient Time For Goal Achievement: 09/22/22 Potential to Achieve Goals: Fair Progress towards PT goals: Progressing toward goals    Frequency    Min 3X/week      PT Plan Current plan remains appropriate       AM-PAC PT "6 Clicks" Mobility   Outcome Measure  Help needed turning from your back to your side while in a flat bed without using bedrails?: A Little Help needed moving from lying on your back to sitting on the side of a flat bed without using bedrails?: A Little Help needed moving to and from a bed to a chair (including a wheelchair)?: A Lot Help needed standing up from a chair using your arms (e.g., wheelchair or bedside chair)?: Total Help needed to walk in hospital room?: Total Help needed climbing 3-5 steps with a railing? : Total 6 Click Score: 11    End of Session Equipment Utilized During Treatment: Gait belt Activity Tolerance: Patient tolerated treatment well Patient left: in chair;with chair alarm set;with call bell/phone within reach Nurse Communication: Mobility status;Need for lift equipment;Weight bearing status PT Visit Diagnosis: Other abnormalities of gait and mobility (R26.89);History of falling (Z91.81)     Time: [id number] PT Time Calculation (min) (ACUTE ONLY): 32 min  Charges:  $Therapeutic Activity: 23-37 mins                    Tomma Rakers, DPT, CLT  Acute Rehabilitation Services Office: [phone] (Secure chat preferred)    Ander Purpura 09/11/2022, 12:46 PM

## 2022-09-11 NOTE — Progress Notes (Signed)
Orthopaedic Trauma Service Progress Note  Patient ID: Teresa Daniels MRN: [id number] DOB/AGE: 82-Nov-1942 82 y.o.  Subjective:  No complaints  Sitting in bedside chair   ROS As above  Objective:   VITALS:   Vitals:   09/10/22 1949 09/10/22 2047 09/11/22 0452 09/11/22 0842  BP: (!) 135/57  (!) 131/53 122/68  Pulse: 73  74 90  Resp:    16  Temp: (!) 97.5 F (36.4 C)   98.3 F (36.8 C)  TempSrc: Oral   Oral  SpO2: 94% 95% 95% 96%  Weight:      Height:        Estimated body mass index is 26.41 kg/m as calculated from the following:   Height as of this encounter: '5\' 5"'$  (1.651 m).   Weight as of this encounter: 72 kg.   Intake/Output      03/03 0701 03/04 0700 03/04 0701 03/05 0700   P.O. 310    Total Intake(mL/kg) 310 (4.3)    Urine (mL/kg/hr) 900 (0.5) 700 (1.5)   Total Output 900 700   Net -590 -700          LABS  Results for orders placed or performed during the hospital encounter of 09/07/22 (from the past 24 hour(s))  Glucose, capillary     Status: Abnormal   Collection Time: 09/10/22  4:48 PM  Result Value Ref Range   Glucose-Capillary 162 (H) 70 - 99 mg/dL  Glucose, capillary     Status: Abnormal   Collection Time: 09/10/22 11:22 PM  Result Value Ref Range   Glucose-Capillary 157 (H) 70 - 99 mg/dL  CBC     Status: Abnormal   Collection Time: 09/11/22  2:57 AM  Result Value Ref Range   WBC 6.0 4.0 - 10.5 K/uL   RBC 2.39 (L) 3.87 - 5.11 MIL/uL   Hemoglobin 7.8 (L) 12.0 - 15.0 g/dL   HCT 24.0 (L) 36.0 - 46.0 %   MCV 100.4 (H) 80.0 - 100.0 fL   MCH 32.6 26.0 - 34.0 pg   MCHC 32.5 30.0 - 36.0 g/dL   RDW 12.8 11.5 - 15.5 %   Platelets 230 150 - 400 K/uL   nRBC 0.0 0.0 - 0.2 %  Basic metabolic panel     Status: Abnormal   Collection Time: 09/11/22  2:57 AM  Result Value Ref Range   Sodium 137 135 - 145 mmol/L   Potassium 4.1 3.5 - 5.1 mmol/L   Chloride 102 98 - 111  mmol/L   CO2 27 22 - 32 mmol/L   Glucose, Bld 156 (H) 70 - 99 mg/dL   BUN 18 8 - 23 mg/dL   Creatinine, Ser 1.21 (H) 0.44 - 1.00 mg/dL   Calcium 8.8 (L) 8.9 - 10.3 mg/dL   GFR, Estimated 45 (L) >60 mL/min   Anion gap 8 5 - 15  Glucose, capillary     Status: Abnormal   Collection Time: 09/11/22  4:59 AM  Result Value Ref Range   Glucose-Capillary 143 (H) 70 - 99 mg/dL  Glucose, capillary     Status: Abnormal   Collection Time: 09/11/22  8:40 AM  Result Value Ref Range   Glucose-Capillary 130 (H) 70 - 99 mg/dL  Glucose, capillary     Status: Abnormal   Collection Time: 09/11/22 11:25  AM  Result Value Ref Range   Glucose-Capillary 166 (H) 70 - 99 mg/dL     PHYSICAL EXAM:   Gen: resting comfortably in bed, sitting up  Ext:       Left Lower Extremity              Short leg splint fitting well, good mold              Ext warm              Moves toes without difficulty              Altered sensation to foot due to diabetic peripheral neuropathy              Normal sensation noted about 1/2 way up leg              No pain out of proportion with passive stretching of toes              Swelling controlled    Assessment/Plan:     Principal Problem:   Ankle fracture Active Problems:   History of stroke   Anemia   CKD (chronic kidney disease), stage III (HCC)   Lumbar stenosis with neurogenic claudication   Hypokalemia   Cellulitis in diabetic foot (HCC)   Chronic combined systolic and diastolic CHF (congestive heart failure) (HCC)   Asthma, chronic   DM2 (diabetes mellitus, type 2) (HCC)   Anti-infectives (From admission, onward)    Start     Dose/Rate Route Frequency Ordered Stop   09/08/22 1400  cephALEXin (KEFLEX) capsule 500 mg        500 mg Oral Every 8 hours 09/08/22 1152 09/13/22 1359   09/07/22 2230  cefTRIAXone (ROCEPHIN) 1 g in sodium chloride 0.9 % 100 mL IVPB  Status:  Discontinued        1 g 200 mL/hr over 30 Minutes Intravenous Every 24 hours 09/07/22  2132 09/08/22 1152     .  82 y/o female with complex medical history including chronic R leg weakness (presumably from Lumbar pathology which has been addressed surgically, Dr. Zada Finders), deconditioning, diabetes with peripheral neuropathy s/p fall with acute L bimalleolar ankle fracture dislocation    -L bimalleolar ankle fracture dislocation s/p closed reduction in ED             Will need surgery to address this unstable fracture             Clinical picture complicated due to deconditioning and recent lumbar laminectomy and fusion             Pt has not been able to use her R leg to help with ambulation due to weakness. She has stated to see some improvements, albeit slowly              Pts left leg was her good leg up until now                She will really be slide or lift transfers as her R leg is too weak                Needs SNF                Continue with aggressive ice and elevation                NWB L leg    Will attempt to get done tomorrow if our schedule allows  Npo after MN                 - Pain management:             Multimodal    - Medical issues              Per primary    - DVT/PE prophylaxis:             Lovenox    - Metabolic Bone Disease:             Dexa early 2023, scanned only left femoral neck which showed osteopenia              Given R ankle fracture about 1 1/2 years ago and L1 compression fracture [id number] she would certainly meet criteria to start treatment for osteoporosis.  They plan to discuss with PCP   - Dispo:             Continue with current care             possible OR tomorrow   Jari Pigg, PA-C [phone] (C) 09/11/2022, 1:32 PM  [facility] [place] [phone] Jenetta [phone] (F)    After 5pm and on the weekends please log on to Amion, go to orthopaedics and the look under the [facility] Call for the provider(s) on call. You can also call our office  at [phone] and then follow the prompts to be connected to the call team.  Patient ID: Teresa Daniels, female   DOB: 1940-11-22, 82 y.o.   MRN: [id number]

## 2022-09-12 ENCOUNTER — Inpatient Hospital Stay (HOSPITAL_COMMUNITY): Payer: Medicare PPO

## 2022-09-12 ENCOUNTER — Inpatient Hospital Stay (HOSPITAL_COMMUNITY): Payer: Medicare PPO | Admitting: Anesthesiology

## 2022-09-12 ENCOUNTER — Encounter (HOSPITAL_COMMUNITY): Payer: Self-pay | Admitting: Internal Medicine

## 2022-09-12 ENCOUNTER — Encounter (HOSPITAL_COMMUNITY)
Admission: EM | Disposition: A | Discharge status: Skilled Nursing Facility | Payer: Self-pay | Source: Home / Self Care | Attending: Internal Medicine

## 2022-09-12 ENCOUNTER — Other Ambulatory Visit: Payer: Self-pay

## 2022-09-12 DIAGNOSIS — E1122 Type 2 diabetes mellitus with diabetic chronic kidney disease: Secondary | ICD-10-CM | POA: Diagnosis not present

## 2022-09-12 DIAGNOSIS — S82892A Other fracture of left lower leg, initial encounter for closed fracture: Secondary | ICD-10-CM | POA: Diagnosis not present

## 2022-09-12 DIAGNOSIS — I129 Hypertensive chronic kidney disease with stage 1 through stage 4 chronic kidney disease, or unspecified chronic kidney disease: Secondary | ICD-10-CM

## 2022-09-12 DIAGNOSIS — Z87891 Personal history of nicotine dependence: Secondary | ICD-10-CM

## 2022-09-12 DIAGNOSIS — Z7984 Long term (current) use of oral hypoglycemic drugs: Secondary | ICD-10-CM

## 2022-09-12 DIAGNOSIS — D631 Anemia in chronic kidney disease: Secondary | ICD-10-CM

## 2022-09-12 DIAGNOSIS — N189 Chronic kidney disease, unspecified: Secondary | ICD-10-CM

## 2022-09-12 HISTORY — PX: SYNDESMOSIS REPAIR: SHX5182

## 2022-09-12 HISTORY — PX: ORIF ANKLE FRACTURE: SHX5408

## 2022-09-12 LAB — CBC WITH DIFFERENTIAL/PLATELET
Abs Immature Granulocytes: 0.02 10*3/uL (ref 0.00–0.07)
Basophils Absolute: 0 10*3/uL (ref 0.0–0.1)
Basophils Relative: 1 %
Eosinophils Absolute: 0.4 10*3/uL (ref 0.0–0.5)
Eosinophils Relative: 6 %
HCT: 24.9 % — ABNORMAL LOW (ref 36.0–46.0)
Hemoglobin: 8.4 g/dL — ABNORMAL LOW (ref 12.0–15.0)
Immature Granulocytes: 0 %
Lymphocytes Relative: 27 %
Lymphs Abs: 1.5 10*3/uL (ref 0.7–4.0)
MCH: 32.8 pg (ref 26.0–34.0)
MCHC: 33.7 g/dL (ref 30.0–36.0)
MCV: 97.3 fL (ref 80.0–100.0)
Monocytes Absolute: 0.5 10*3/uL (ref 0.1–1.0)
Monocytes Relative: 9 %
Neutro Abs: 3.2 10*3/uL (ref 1.7–7.7)
Neutrophils Relative %: 57 %
Platelets: 258 10*3/uL (ref 150–400)
RBC: 2.56 MIL/uL — ABNORMAL LOW (ref 3.87–5.11)
RDW: 12.9 % (ref 11.5–15.5)
WBC: 5.7 10*3/uL (ref 4.0–10.5)
nRBC: 0 % (ref 0.0–0.2)

## 2022-09-12 LAB — BASIC METABOLIC PANEL
Anion gap: 5 (ref 5–15)
BUN: 21 mg/dL (ref 8–23)
CO2: 29 mmol/L (ref 22–32)
Calcium: 8.9 mg/dL (ref 8.9–10.3)
Chloride: 102 mmol/L (ref 98–111)
Creatinine, Ser: 1.41 mg/dL — ABNORMAL HIGH (ref 0.44–1.00)
GFR, Estimated: 37 mL/min — ABNORMAL LOW (ref >60)
Glucose, Bld: 148 mg/dL — ABNORMAL HIGH (ref 70–99)
Potassium: 4.4 mmol/L (ref 3.5–5.1)
Sodium: 136 mmol/L (ref 135–145)

## 2022-09-12 LAB — SURGICAL PCR SCREEN
MRSA, PCR: NEGATIVE
Staphylococcus aureus: NEGATIVE

## 2022-09-12 LAB — GLUCOSE, CAPILLARY
Glucose-Capillary: 191 mg/dL — ABNORMAL HIGH (ref 70–99)
Glucose-Capillary: 155 mg/dL — ABNORMAL HIGH (ref 70–99)
Glucose-Capillary: 144 mg/dL — ABNORMAL HIGH (ref 70–99)
Glucose-Capillary: 198 mg/dL — ABNORMAL HIGH (ref 70–99)

## 2022-09-12 SURGERY — OPEN REDUCTION INTERNAL FIXATION (ORIF) ANKLE FRACTURE
Anesthesia: General | Site: Ankle | Laterality: Left

## 2022-09-12 MED ORDER — FENTANYL CITRATE (PF) 250 MCG/5ML IJ SOLN
INTRAMUSCULAR | Status: DC | PRN
  Administered 2022-09-12: 100 ug via INTRAVENOUS

## 2022-09-12 MED ORDER — ROCURONIUM BROMIDE 10 MG/ML (PF) SYRINGE
PREFILLED_SYRINGE | INTRAVENOUS | Status: AC
  Filled 2022-09-12: qty 10

## 2022-09-12 MED ORDER — LIDOCAINE 2% (20 MG/ML) 5 ML SYRINGE
INTRAMUSCULAR | Status: AC
  Filled 2022-09-12: qty 5

## 2022-09-12 MED ORDER — CEFAZOLIN SODIUM-DEXTROSE 2-4 GM/100ML-% IV SOLN
2.0000 g | Freq: Three times a day (TID) | INTRAVENOUS | Status: AC
  Administered 2022-09-12 – 2022-09-13 (×3): 2 g via INTRAVENOUS
  Filled 2022-09-12 (×3): qty 100

## 2022-09-12 MED ORDER — CEFAZOLIN SODIUM 1 G IJ SOLR
INTRAMUSCULAR | Status: AC
  Filled 2022-09-12: qty 20

## 2022-09-12 MED ORDER — BUDESONIDE 0.5 MG/2ML IN SUSP
RESPIRATORY_TRACT | Status: AC
  Filled 2022-09-12: qty 2

## 2022-09-12 MED ORDER — DEXAMETHASONE SODIUM PHOSPHATE 10 MG/ML IJ SOLN
INTRAMUSCULAR | Status: AC
  Filled 2022-09-12: qty 1

## 2022-09-12 MED ORDER — SODIUM CHLORIDE 0.9 % IV SOLN: INTRAVENOUS | Status: DC

## 2022-09-12 MED ORDER — FENTANYL CITRATE (PF) 100 MCG/2ML IJ SOLN: 50.0000 ug | Freq: Once | INTRAMUSCULAR | Status: AC

## 2022-09-12 MED ORDER — PROPOFOL 10 MG/ML IV BOLUS
INTRAVENOUS | Status: DC | PRN
  Administered 2022-09-12: 100 mg via INTRAVENOUS

## 2022-09-12 MED ORDER — FENTANYL CITRATE (PF) 250 MCG/5ML IJ SOLN
INTRAMUSCULAR | Status: AC
  Filled 2022-09-12: qty 5

## 2022-09-12 MED ORDER — PHENYLEPHRINE 80 MCG/ML (10ML) SYRINGE FOR IV PUSH (FOR BLOOD PRESSURE SUPPORT)
PREFILLED_SYRINGE | INTRAVENOUS | Status: DC | PRN
  Administered 2022-09-12: 80 ug via INTRAVENOUS
  Administered 2022-09-12 (×2): 160 ug via INTRAVENOUS

## 2022-09-12 MED ORDER — ROCURONIUM BROMIDE 10 MG/ML (PF) SYRINGE
PREFILLED_SYRINGE | INTRAVENOUS | Status: DC | PRN
  Administered 2022-09-12: 40 mg via INTRAVENOUS

## 2022-09-12 MED ORDER — CLOPIDOGREL BISULFATE 75 MG PO TABS
75.0000 mg | ORAL_TABLET | Freq: Every day | ORAL | Status: DC
  Administered 2022-09-13: 75 mg via ORAL
  Filled 2022-09-12: qty 1

## 2022-09-12 MED ORDER — MIDAZOLAM HCL 2 MG/2ML IJ SOLN
INTRAMUSCULAR | Status: AC
  Filled 2022-09-12: qty 2

## 2022-09-12 MED ORDER — BUPIVACAINE-EPINEPHRINE (PF) 0.5% -1:200000 IJ SOLN
INTRAMUSCULAR | Status: DC | PRN
  Administered 2022-09-12: 25 mL via PERINEURAL
  Administered 2022-09-12: 15 mL via PERINEURAL

## 2022-09-12 MED ORDER — ORAL CARE MOUTH RINSE: 15.0000 mL | Freq: Once | OROMUCOSAL | Status: AC

## 2022-09-12 MED ORDER — OXYCODONE HCL 5 MG/5ML PO SOLN: 5.0000 mg | Freq: Once | ORAL | Status: DC | PRN

## 2022-09-12 MED ORDER — CEFAZOLIN SODIUM-DEXTROSE 2-3 GM-%(50ML) IV SOLR
INTRAVENOUS | Status: DC | PRN
  Administered 2022-09-12: 2 g via INTRAVENOUS

## 2022-09-12 MED ORDER — PROPOFOL 10 MG/ML IV BOLUS
INTRAVENOUS | Status: AC
  Filled 2022-09-12: qty 20

## 2022-09-12 MED ORDER — CHLORHEXIDINE GLUCONATE 0.12 % MT SOLN: 15.0000 mL | Freq: Once | OROMUCOSAL | Status: AC

## 2022-09-12 MED ORDER — ONDANSETRON HCL 4 MG/2ML IJ SOLN: 4.0000 mg | Freq: Once | INTRAMUSCULAR | Status: DC | PRN

## 2022-09-12 MED ORDER — ONDANSETRON HCL 4 MG/2ML IJ SOLN
INTRAMUSCULAR | Status: AC
  Filled 2022-09-12: qty 2

## 2022-09-12 MED ORDER — PHENYLEPHRINE 80 MCG/ML (10ML) SYRINGE FOR IV PUSH (FOR BLOOD PRESSURE SUPPORT)
PREFILLED_SYRINGE | INTRAVENOUS | Status: AC
  Filled 2022-09-12: qty 10

## 2022-09-12 MED ORDER — SUGAMMADEX SODIUM 200 MG/2ML IV SOLN
INTRAVENOUS | Status: DC | PRN
  Administered 2022-09-12: 140 mg via INTRAVENOUS

## 2022-09-12 MED ORDER — PHENYLEPHRINE HCL-NACL 20-0.9 MG/250ML-% IV SOLN
INTRAVENOUS | Status: DC | PRN
  Administered 2022-09-12: 40 ug/min via INTRAVENOUS

## 2022-09-12 MED ORDER — ACETAMINOPHEN 500 MG PO TABS
1000.0000 mg | ORAL_TABLET | Freq: Once | ORAL | Status: DC
  Filled 2022-09-12: qty 2

## 2022-09-12 MED ORDER — EPHEDRINE SULFATE-NACL 50-0.9 MG/10ML-% IV SOSY
PREFILLED_SYRINGE | INTRAVENOUS | Status: DC | PRN
  Administered 2022-09-12 (×5): 5 mg via INTRAVENOUS

## 2022-09-12 MED ORDER — OXYCODONE HCL 5 MG PO TABS: 5.0000 mg | ORAL_TABLET | Freq: Once | ORAL | Status: DC | PRN

## 2022-09-12 MED ORDER — LACTATED RINGERS IV SOLN: INTRAVENOUS | Status: DC

## 2022-09-12 MED ORDER — LIDOCAINE 2% (20 MG/ML) 5 ML SYRINGE
INTRAMUSCULAR | Status: DC | PRN
  Administered 2022-09-12: 60 mg via INTRAVENOUS

## 2022-09-12 MED ORDER — 0.9 % SODIUM CHLORIDE (POUR BTL) OPTIME
TOPICAL | Status: DC | PRN
  Administered 2022-09-12: 1000 mL

## 2022-09-12 MED ORDER — FENTANYL CITRATE (PF) 100 MCG/2ML IJ SOLN: 25.0000 ug | INTRAMUSCULAR | Status: DC | PRN

## 2022-09-12 MED ORDER — ONDANSETRON HCL 4 MG/2ML IJ SOLN
INTRAMUSCULAR | Status: DC | PRN
  Administered 2022-09-12: 4 mg via INTRAVENOUS

## 2022-09-12 MED ORDER — CHLORHEXIDINE GLUCONATE 0.12 % MT SOLN
OROMUCOSAL | Status: AC
  Administered 2022-09-12: 15 mL via OROMUCOSAL
  Filled 2022-09-12: qty 15

## 2022-09-12 MED ORDER — EPHEDRINE 5 MG/ML INJ
INTRAVENOUS | Status: AC
  Filled 2022-09-12: qty 5

## 2022-09-12 MED ORDER — FENTANYL CITRATE (PF) 100 MCG/2ML IJ SOLN
INTRAMUSCULAR | Status: AC
  Administered 2022-09-12: 50 ug via INTRAVENOUS
  Filled 2022-09-12: qty 2

## 2022-09-12 SURGICAL SUPPLY — 75 items
BAG COUNTER SPONGE SURGICOUNT (BAG) ×2 IMPLANT
BANDAGE ESMARK 6X9 LF (GAUZE/BANDAGES/DRESSINGS) ×2 IMPLANT
BIT DRILL 2.4X140 LONG SOLID (BIT) IMPLANT
BIT DRILL CANNULTD 2.6 X 130MM (DRILL) IMPLANT
BIT DRILL SOLID 2.0 X 110MM (DRILL) IMPLANT
BNDG ELASTIC 4X5.8 VLCR STR LF (GAUZE/BANDAGES/DRESSINGS) IMPLANT
BNDG ELASTIC 6X5.8 VLCR STR LF (GAUZE/BANDAGES/DRESSINGS) IMPLANT
BNDG ESMARK 6X9 LF (GAUZE/BANDAGES/DRESSINGS)
BNDG GAUZE DERMACEA FLUFF 4 (GAUZE/BANDAGES/DRESSINGS) ×4 IMPLANT
BRUSH SCRUB EZ PLAIN DRY (MISCELLANEOUS) ×4 IMPLANT
COVER MAYO STAND STRL (DRAPES) ×2 IMPLANT
COVER SURGICAL LIGHT HANDLE (MISCELLANEOUS) ×2 IMPLANT
CUFF TOURN SGL QUICK 34 (TOURNIQUET CUFF)
CUFF TRNQT CYL 34X4.125X (TOURNIQUET CUFF) ×2 IMPLANT
DRAPE C-ARM 42X72 X-RAY (DRAPES) ×2 IMPLANT
DRAPE C-ARMOR (DRAPES) ×2 IMPLANT
DRAPE HALF SHEET 40X57 (DRAPES) ×2 IMPLANT
DRAPE U-SHAPE 47X51 STRL (DRAPES) ×2 IMPLANT
DRILL CANNULATED 2.6 X 130MM (DRILL) ×2
DRILL SOLID 2.0 X 110MM (DRILL) ×2
DRSG EMULSION OIL 3X3 NADH (GAUZE/BANDAGES/DRESSINGS) IMPLANT
DRSG MEPITEL 4X7.2 (GAUZE/BANDAGES/DRESSINGS) IMPLANT
ELECT REM PT RETURN 9FT ADLT (ELECTROSURGICAL) ×2
ELECTRODE REM PT RTRN 9FT ADLT (ELECTROSURGICAL) ×2 IMPLANT
GAUZE PAD ABD 8X10 STRL (GAUZE/BANDAGES/DRESSINGS) IMPLANT
GAUZE SPONGE 4X4 12PLY STRL (GAUZE/BANDAGES/DRESSINGS) ×4 IMPLANT
GLOVE BIO SURGEON STRL SZ7.5 (GLOVE) ×2 IMPLANT
GLOVE BIO SURGEON STRL SZ8 (GLOVE) ×2 IMPLANT
GLOVE BIOGEL PI IND STRL 7.5 (GLOVE) ×2 IMPLANT
GLOVE BIOGEL PI IND STRL 8 (GLOVE) ×2 IMPLANT
GLOVE SURG ORTHO LTX SZ7.5 (GLOVE) ×4 IMPLANT
GOWN STRL REUS W/ TWL LRG LVL3 (GOWN DISPOSABLE) ×4 IMPLANT
GOWN STRL REUS W/ TWL XL LVL3 (GOWN DISPOSABLE) ×2 IMPLANT
GOWN STRL REUS W/TWL LRG LVL3 (GOWN DISPOSABLE) ×4
GOWN STRL REUS W/TWL XL LVL3 (GOWN DISPOSABLE) ×2
K-WIRE SNGL END 1.2X150 (MISCELLANEOUS) ×4
KIT BASIN OR (CUSTOM PROCEDURE TRAY) ×2 IMPLANT
KIT TURNOVER KIT B (KITS) ×2 IMPLANT
KWIRE SNGL END 1.2X150 (MISCELLANEOUS) IMPLANT
MANIFOLD NEPTUNE II (INSTRUMENTS) ×2 IMPLANT
NDL HYPO 21X1.5 SAFETY (NEEDLE) IMPLANT
NEEDLE HYPO 21X1.5 SAFETY (NEEDLE) IMPLANT
NS IRRIG 1000ML POUR BTL (IV SOLUTION) ×2 IMPLANT
PACK GENERAL/GYN (CUSTOM PROCEDURE TRAY) ×2 IMPLANT
PACK ORTHO EXTREMITY (CUSTOM PROCEDURE TRAY) ×2 IMPLANT
PAD ARMBOARD 7.5X6 YLW CONV (MISCELLANEOUS) ×4 IMPLANT
PAD CAST 4YDX4 CTTN HI CHSV (CAST SUPPLIES) IMPLANT
PADDING CAST COTTON 4X4 STRL (CAST SUPPLIES) ×4
PADDING CAST COTTON 6X4 STRL (CAST SUPPLIES) IMPLANT
PLATE FIBULAR CL 11H LT (Plate) IMPLANT
SCREW 4.0X28 STRL (Screw) IMPLANT
SCREW 4.0X30MM (Screw) ×2 IMPLANT
SCREW BN ST 30X4XCANN HD (Screw) IMPLANT
SCREW CANN NL 3.5X40 (Screw) IMPLANT
SCREW LOCK PLATE R3 2.7X16 (Screw) IMPLANT
SCREW LOCK PLATE R3 2.7X18 (Screw) IMPLANT
SCREW LOCK PLATE R3 3.5X12 (Screw) IMPLANT
SCREW LOCK PLATE R3 3.5X14 (Screw) IMPLANT
SCREW NON LOCKING 3.5X12 (Screw) IMPLANT
SCREW NON LOCKING 3.5X14 (Screw) IMPLANT
SCREW NONLOCK PL R3 2.7X19 (Screw) IMPLANT
SPONGE T-LAP 18X18 ~~LOC~~+RFID (SPONGE) ×2 IMPLANT
STAPLER VISISTAT 35W (STAPLE) IMPLANT
SUCTION FRAZIER HANDLE 10FR (MISCELLANEOUS) ×2
SUCTION TUBE FRAZIER 10FR DISP (MISCELLANEOUS) ×2 IMPLANT
SUT ETHILON 2 0 FS 18 (SUTURE) ×4 IMPLANT
SUT PDS AB 2-0 CT1 27 (SUTURE) IMPLANT
SUT VIC AB 2-0 CT1 27 (SUTURE) ×4
SUT VIC AB 2-0 CT1 TAPERPNT 27 (SUTURE) ×4 IMPLANT
TOWEL GREEN STERILE (TOWEL DISPOSABLE) ×4 IMPLANT
TOWEL GREEN STERILE FF (TOWEL DISPOSABLE) ×2 IMPLANT
TUBE CONNECTING 12X1/4 (SUCTIONS) ×2 IMPLANT
UNDERPAD 30X36 HEAVY ABSORB (UNDERPADS AND DIAPERS) ×2 IMPLANT
WATER STERILE IRR 1000ML POUR (IV SOLUTION) ×2 IMPLANT
WIRE OLIVE SMOOTH 1.4MMX60MM (WIRE) IMPLANT

## 2022-09-12 NOTE — Anesthesia Procedure Notes (Signed)
Procedure Name: Intubation Date/Time: 09/12/2022 1:39 PM  Performed by: Michele Rockers, CRNAPre-anesthesia Checklist: Patient identified, Patient being monitored, Timeout performed, Emergency Drugs available and Suction available Patient Re-evaluated:Patient Re-evaluated prior to induction Oxygen Delivery Method: Circle System Utilized Preoxygenation: Pre-oxygenation with 100% oxygen Induction Type: IV induction Ventilation: Mask ventilation without difficulty Laryngoscope Size: Miller and 2 Grade View: Grade I Tube type: Oral Tube size: 7.0 mm Number of attempts: 1 Airway Equipment and Method: Stylet Placement Confirmation: ETT inserted through vocal cords under direct vision, positive ETCO2 and breath sounds checked- equal and bilateral Secured at: 21 cm Tube secured with: Tape Dental Injury: Teeth and Oropharynx as per pre-operative assessment

## 2022-09-12 NOTE — Progress Notes (Signed)
Orthopedic Tech Progress Note Patient Details:  Teresa Daniels 1941-04-12 [id number]  Patient ID: Teresa Daniels, female   DOB: February 19, 1941, 82 y.o.   MRN: [id number] Pt exceeds age limit for overhead frame. Lamoyne Palencia OTR/L 09/12/2022, 8:31 AM

## 2022-09-12 NOTE — Anesthesia Preprocedure Evaluation (Addendum)
Anesthesia Evaluation  Patient identified by MRN, date of birth, ID band Patient awake    Reviewed: Allergy & Precautions, NPO status , Patient's Chart, lab work & pertinent test results  History of Anesthesia Complications Negative for: history of anesthetic complications  Airway Mallampati: II  TM Distance: >3 FB Neck ROM: Full    Dental  (+) Dental Advisory Given, Partial Upper, Lower Dentures   Pulmonary asthma , former smoker   Pulmonary exam normal        Cardiovascular hypertension, Pt. on medications Normal cardiovascular exam     Neuro/Psych  PSYCHIATRIC DISORDERS  Depression     Hearing loss   Neuromuscular disease CVA (RLE weakness), Residual Symptoms    GI/Hepatic Neg liver ROS,GERD  Medicated and Controlled,,  Endo/Other  diabetes, Type 2, Oral Hypoglycemic Agents    Renal/GU CRFRenal disease     Musculoskeletal  (+) Arthritis ,    Abdominal   Peds  Hematology  (+) Blood dyscrasia, anemia  On plavix, last dose this AM (surgeon aware)     Anesthesia Other Findings   Reproductive/Obstetrics                             Anesthesia Physical Anesthesia Plan  ASA: 3  Anesthesia Plan: General   Post-op Pain Management: Regional block* and Tylenol PO (pre-op)*   Induction: Intravenous  PONV Risk Score and Plan: 3 and Treatment may vary due to age or medical condition, Ondansetron and Propofol infusion  Airway Management Planned: Oral ETT  Additional Equipment: None  Intra-op Plan:   Post-operative Plan: Extubation in OR  Informed Consent: I have reviewed the patients History and Physical, chart, labs and discussed the procedure including the risks, benefits and alternatives for the proposed anesthesia with the patient or authorized representative who has indicated his/her understanding and acceptance.     Dental advisory given  Plan Discussed with: CRNA and  Anesthesiologist  Anesthesia Plan Comments:         Anesthesia Quick Evaluation

## 2022-09-12 NOTE — Progress Notes (Signed)
No changes overnight.  The risks and benefits of surgery for left ankle repair were discussed with the patient, including the possibility of infection, nerve injury, vessel injury, wound breakdown, arthritis, symptomatic hardware, DVT/ PE, loss of motion, malunion, nonunion, and need for further surgery among others. These risks were acknowledged and consent provided to proceed.  Altamese Worthington, MD [facility], [facility] [phone]

## 2022-09-12 NOTE — Anesthesia Postprocedure Evaluation (Signed)
Anesthesia Post Note  Patient: Teresa Daniels  Procedure(s) Performed: LEFT OPEN REDUCTION INTERNAL FIXATION (ORIF) ANKLE FRACTURE (Left: Ankle) SYNDESMOSIS REPAIR (Left)     Patient location during evaluation: PACU Anesthesia Type: General and Regional Level of consciousness: awake Pain management: pain level controlled Vital Signs Assessment: post-procedure vital signs reviewed and stable Respiratory status: spontaneous breathing, nonlabored ventilation and respiratory function stable Cardiovascular status: blood pressure returned to baseline and stable Postop Assessment: no apparent nausea or vomiting Anesthetic complications: no  No notable events documented.  Last Vitals:  Vitals:   09/12/22 1800 09/12/22 1955  BP: 128/72 118/62  Pulse: 96 86  Resp: 20 15  Temp: (!) 36.3 C 36.8 C  SpO2: 95% 99%    Last Pain:  Vitals:   09/12/22 1955  TempSrc: Oral  PainSc:                  Nyeem Stoke P Willisha Sligar

## 2022-09-12 NOTE — Anesthesia Procedure Notes (Signed)
Anesthesia Regional Block: Popliteal block   Pre-Anesthetic Checklist: , timeout performed,  Correct Patient, Correct Site, Correct Laterality,  Correct Procedure, Correct Position, site marked,  Risks and benefits discussed,  Surgical consent,  Pre-op evaluation,  At surgeon's request and post-op pain management  Laterality: Left  Prep: chloraprep       Needles:  Injection technique: Single-shot  Needle Type: Echogenic Needle     Needle Length: 10cm  Needle Gauge: 21     Additional Needles:   Narrative:  Start time: 09/12/2022 11:52 AM End time: 09/12/2022 11:55 AM Injection made incrementally with aspirations every 5 mL.  Performed by: Personally  Anesthesiologist: Audry Pili, MD  Additional Notes: No pain on injection. No increased resistance to injection. Injection made in 5cc increments. Good needle visualization. Patient tolerated the procedure well.

## 2022-09-12 NOTE — Anesthesia Procedure Notes (Signed)
Anesthesia Regional Block: Adductor canal block   Pre-Anesthetic Checklist: , timeout performed,  Correct Patient, Correct Site, Correct Laterality,  Correct Procedure, Correct Position, site marked,  Risks and benefits discussed,  Surgical consent,  Pre-op evaluation,  At surgeon's request and post-op pain management  Laterality: Left  Prep: chloraprep       Needles:  Injection technique: Single-shot  Needle Type: Echogenic Needle     Needle Length: 10cm  Needle Gauge: 21     Additional Needles:   Narrative:  Start time: 09/12/2022 11:48 AM End time: 09/12/2022 11:51 AM Injection made incrementally with aspirations every 5 mL.  Performed by: Personally  Anesthesiologist: Audry Pili, MD  Additional Notes: No pain on injection. No increased resistance to injection. Injection made in 5cc increments. Good needle visualization. Patient tolerated the procedure well.

## 2022-09-12 NOTE — Transfer of Care (Signed)
Immediate Anesthesia Transfer of Care Note  Patient: Teresa Daniels  Procedure(s) Performed: LEFT OPEN REDUCTION INTERNAL FIXATION (ORIF) ANKLE FRACTURE (Left: Ankle) SYNDESMOSIS REPAIR (Left)  Patient Location: PACU  Anesthesia Type:General  Level of Consciousness: awake, alert , oriented, patient cooperative, and responds to stimulation  Airway & Oxygen Therapy: Patient Spontanous Breathing  Post-op Assessment: Report given to RN and Post -op Vital signs reviewed and stable  Post vital signs: Reviewed and stable  Last Vitals:  Vitals Value Taken Time  BP 142/77 09/12/22 1524  Temp 36.7 C 09/12/22 1524  Pulse 105 09/12/22 1528  Resp 15 09/12/22 1528  SpO2 96 % 09/12/22 1528  Vitals shown include unvalidated device data.  Last Pain:  Vitals:   09/12/22 1116  TempSrc: Oral  PainSc: 0-No pain      Patients Stated Pain Goal: 2 ([id number])  Complications: No notable events documented.

## 2022-09-12 NOTE — Progress Notes (Signed)
PROGRESS NOTE  Teresa Daniels  DOB: 02-Sep-1940  PCP: [name], PA-C [id number]  DOA: 09/07/2022  LOS: 1 day  Hospital Day: 6  Brief narrative: Teresa Daniels is a 82 y.o. female with PMH significant for DM2, HTN, HLD, stroke, CKD, peripheral neuropathy, GERD, chronic anemia, depression, spondylolisthesis s/p laminectomy who lives in an independent living facility.  She had a recent fall and had laceration of the right foot with fracture of the small toe.  2/29, patient was brought to the ED after a fall fall and severe left ankle pain.   She was noted to have swelling, deformity and tenderness of the left ankle. X-ray showed bimalleolar fracture with dislocation of the left ankle.   Orthopedics consulted and recommended outpatient follow-up in a week to discuss about ORIF.   PT/OT recommended SNF and hence patient could not be discharged.  Subjective: Patient was seen and examined this morning prior to surgery. Not in distress.  Remains hemodynamically stable.  Assessment and plan: Left ankle bimalleolar fracture Secondary to a fall.   X-ray as above.   Orthopedics plans for surgical fixation today. Continue pain management with scheduled Tylenol, as needed oxycodone and as needed IV morphine  Recent right foot small toe fracture She recently fell and fractured her right small toe.  Right toenail has fallen.  Wound looks good.  Was taking Augmentin as an outpatient.  It was changed to Rocephin on admission, transition to Keflex on 09/08/2022  Recurrent falls Multifactorial : Deficits due to stroke, peripheral neuropathy, h/o chronic back pain/lumbar stenosis s/p laminectomy Currently mostly wheelchair-bound. SNF recommended by PT  Chronic anemia Baseline hemoglobin between 8 and 9.  Continues to be at baseline.  Continue monitor. Recent Labs    09/07/22 2008 09/08/22 0552 09/09/22 0430 09/10/22 0430 09/11/22 0257 09/12/22 0303  HGB  --  8.4* 8.5* 8.1* 7.8* 8.4*   MCV  --  97.6 97.2 99.6 100.4* 97.3  VITAMINB12 1,312*  --   --   --   --   --   FOLATE 10.9  --   --   --   --   --   FERRITIN 394*  --   --   --   --   --   TIBC 204*  --   --   --   --   --   IRON 51  --   --   --   --   --   RETICCTPCT 2.2  --   --   --   --   --    Type 2 diabetes mellitus Peripheral neuropathy A1c 6.6 on 09/07/2022 PTA on glipizide 2.5 mg daily Currently on sliding scale insulin with Accu-Cheks. Continue Neurontin Recent Labs  Lab 09/11/22 1125 09/11/22 1650 09/11/22 2001 09/12/22 0825 09/12/22 1055  GLUCAP 166* 148* 173* 191* 155*   H/o stroke HLD Continue statin.  Plavix held  CKD stage IIIa Currently kidney function at baseline.   Recent Labs    01/25/22 1033 03/09/22 1239 03/11/22 0042 07/27/22 1150 09/07/22 1718 09/08/22 0552 09/10/22 0430 09/11/22 0257 09/12/22 0303  BUN 34* 35* 40*  --  '20 16 19 18 21  '$ CREATININE 1.69* 1.67* 1.59* 1.64* 1.28* 1.12* 1.08* 1.21* 1.41*   H/o combined systolic/diastolic CHF Essential hypertension Found to be dehydrated on presentation and was given gentle IV hydration.  Currently euvolemic. Most recent echo from 02/2022 showed EF of 50 to [id number], grade 1 diastolic dysfunction.  PTA on HCTZ  25 mg daily, losartan 50 mg daily Currently meds on hold.  Continue IV fluid..   Chronic asthma Currently respiratory status is stable.  Continue home inhalers, bronchodilators, Singulair   Chronic macrocytic anemia Hemoglobin at baseline between 8 and 9.   Hemoglobin remains stable. Continue vitamin B12, iron supplement Recent Labs    09/07/22 2008 09/08/22 0552 09/09/22 0430 09/10/22 0430 09/11/22 0257 09/12/22 0303  HGB  --  8.4* 8.5* 8.1* 7.8* 8.4*  MCV  --  97.6 97.2 99.6 100.4* 97.3  VITAMINB12 1,312*  --   --   --   --   --   FOLATE 10.9  --   --   --   --   --   FERRITIN 394*  --   --   --   --   --   TIBC 204*  --   --   --   --   --   IRON 51  --   --   --   --   --   RETICCTPCT 2.2  --   --    --   --   --    GERD Continue PPI   Depression/anxiety Continue Zoloft    Mobility: Needs PT eval postprocedure  Goals of care   Code Status: Full Code    DVT prophylaxis: SCDs Start: 09/07/22 1951   Antimicrobials: Perioperative antibiotics Fluid: NS at 75 mill per hour Consultants: Orthopedics Family Communication: None at bedside  Status is: Observation Level of care: Telemetry Medical   Dispo: Patient is from: Home              Anticipated d/c is to: Pending clinical course Continue in-hospital care because: Pending surgery   Scheduled Meds:  [MAR Hold] acetaminophen  1,000 mg Oral TID   acetaminophen  1,000 mg Oral Once   [MAR Hold] atorvastatin  40 mg Oral QHS   budesonide       [MAR Hold] budesonide (PULMICORT) nebulizer solution  0.25 mg Nebulization BID   [MAR Hold] cephALEXin  500 mg Oral Q8H   [MAR Hold] gabapentin  300 mg Oral QPC supper   [MAR Hold] insulin aspart  0-5 Units Subcutaneous QHS   [MAR Hold] insulin aspart  0-9 Units Subcutaneous TID WC   [MAR Hold] montelukast  10 mg Oral QPC supper   [MAR Hold] multivitamin with minerals  1 tablet Oral Daily   [MAR Hold] mupirocin ointment   Topical BID   [MAR Hold] pantoprazole  40 mg Oral QAC breakfast   [MAR Hold] Ensure Max Protein  11 oz Oral BID   [MAR Hold] sertraline  100 mg Oral QPC supper    PRN meds: budesonide, [MAR Hold] methocarbamol **OR** [MAR Hold] methocarbamol (ROBAXIN) IV, [MAR Hold]  morphine injection, [MAR Hold] oxyCODONE   Infusions:   sodium chloride 75 mL/hr at 09/12/22 1016   lactated ringers 10 mL/hr at 09/12/22 1259   [MAR Hold] methocarbamol (ROBAXIN) IV      Diet:  Diet Order             Diet NPO time specified Except for: Sips with Meds  Diet effective midnight                   Antimicrobials: Anti-infectives (From admission, onward)    Start     Dose/Rate Route Frequency Ordered Stop   09/12/22 1100  ceFAZolin (ANCEF) IVPB 2g/100 mL premix        2  g  200 mL/hr over 30 Minutes Intravenous  Once 09/11/22 1707 09/12/22 1049   09/08/22 1400  [MAR Hold]  cephALEXin (KEFLEX) capsule 500 mg        (MAR Hold since Tue 09/12/2022 at 1102.Hold Reason: Transfer to a Procedural area)   500 mg Oral Every 8 hours 09/08/22 1152 09/13/22 1359   09/07/22 2230  cefTRIAXone (ROCEPHIN) 1 g in sodium chloride 0.9 % 100 mL IVPB  Status:  Discontinued        1 g 200 mL/hr over 30 Minutes Intravenous Every 24 hours 09/07/22 2132 09/08/22 1152       Skin assessment:       Nutritional status:  Body mass index is 26.41 kg/m.  Nutrition Problem: Increased nutrient needs Etiology: hip fracture Signs/Symptoms: estimated needs     Objective: Vitals:   09/12/22 1155 09/12/22 1200  BP: (!) 125/54 (!) 124/53  Pulse: 71 78  Resp: 11 12  Temp:    SpO2: 95% 96%    Intake/Output Summary (Last 24 hours) at 09/12/2022 1314 Last data filed at 09/12/2022 0600 Gross per 24 hour  Intake 240 ml  Output 700 ml  Net -460 ml   Filed Weights   09/07/22 1114  Weight: 72 kg   Weight change:  Body mass index is 26.41 kg/m.   Physical Exam: General exam: Pleasant elderly Caucasian female. Skin: No rashes, lesions or ulcers. HEENT: Atraumatic, normocephalic, no obvious bleeding Lungs: Clear to auscultation bilaterally CVS: Regular rate and rhythm, no murmur GI/Abd soft, nontender, nondistended, bowel sound present.   CNS: Alert, awake, oriented x 3 Psychiatry: Mood appropriate Extremities: No pedal edema, no calf tenderness, left ankle fracture on a splint  Data Review: I have personally reviewed the laboratory data and studies available.  F/u labs ordered Unresulted Labs (From admission, onward)     Start     Ordered   09/12/22 1052  Surgical pcr screen  Once,   R        09/12/22 1052   09/12/22 0725  Surgical pcr screen  Once,   R        09/12/22 0724            Total time spent in review of labs and imaging, patient evaluation,  formulation of plan, documentation and communication with family: 2 minutes  Signed, Terrilee Croak, MD [facility] 09/12/2022

## 2022-09-12 NOTE — Progress Notes (Signed)
Ok to resume plavix on POD1 per ortho. Dr. Pietro Cassis is aware.  Onnie Boer, PharmD, BCIDP, AAHIVP, CPP Infectious Disease Pharmacist 09/12/2022 5:41 PM

## 2022-09-13 ENCOUNTER — Encounter (HOSPITAL_COMMUNITY): Payer: Self-pay | Admitting: Orthopedic Surgery

## 2022-09-13 DIAGNOSIS — S82892A Other fracture of left lower leg, initial encounter for closed fracture: Secondary | ICD-10-CM | POA: Diagnosis not present

## 2022-09-13 LAB — CBC
HCT: 21.8 % — ABNORMAL LOW (ref 36.0–46.0)
Hemoglobin: 7.3 g/dL — ABNORMAL LOW (ref 12.0–15.0)
MCH: 33.2 pg (ref 26.0–34.0)
MCHC: 33.5 g/dL (ref 30.0–36.0)
MCV: 99.1 fL (ref 80.0–100.0)
Platelets: 239 10*3/uL (ref 150–400)
RBC: 2.2 MIL/uL — ABNORMAL LOW (ref 3.87–5.11)
RDW: 13.2 % (ref 11.5–15.5)
WBC: 6.6 10*3/uL (ref 4.0–10.5)
nRBC: 0 % (ref 0.0–0.2)

## 2022-09-13 LAB — GLUCOSE, CAPILLARY
Glucose-Capillary: 169 mg/dL — ABNORMAL HIGH (ref 70–99)
Glucose-Capillary: 176 mg/dL — ABNORMAL HIGH (ref 70–99)
Glucose-Capillary: 143 mg/dL — ABNORMAL HIGH (ref 70–99)

## 2022-09-13 NOTE — Progress Notes (Signed)
Orthopaedic Trauma Service Progress Note  Patient ID: Teresa Daniels MRN: [id number] DOB/AGE: 03/24/1941 82 y.o.  Subjective:  No complaints today  Block still working for left ankle   Surgery went very well yesterday   ROS As above Objective:   VITALS:   Vitals:   09/12/22 1800 09/12/22 1955 09/13/22 0424 09/13/22 0740  BP: 128/72 118/62 (!) 121/52 (!) 116/55  Pulse: 96 86 80 82  Resp: '20 15 16 16  '$ Temp: (!) 97.4 F (36.3 C) 98.2 F (36.8 C) 98.3 F (36.8 C) 99 F (37.2 C)  TempSrc: Oral Oral Oral Oral  SpO2: 95% 99% 97% 93%  Weight:      Height:        Estimated body mass index is 26.41 kg/m as calculated from the following:   Height as of this encounter: '5\' 5"'$  (1.651 m).   Weight as of this encounter: 72 kg.   Intake/Output      03/05 0701 03/06 0700 03/06 0701 03/07 0700   P.O. 390    I.V. (mL/kg) 2409.3 (33.5)    IV Piggyback 200    Total Intake(mL/kg) 2999.3 (41.7)    Urine (mL/kg/hr) 500 (0.3) 400 (1.5)   Total Output 500 400   Net +2499.3 -400          LABS  Results for orders placed or performed during the hospital encounter of 09/07/22 (from the past 24 hour(s))  Surgical pcr screen     Status: None   Collection Time: 09/12/22 10:52 AM   Specimen: Nasal Mucosa; Nasal Swab  Result Value Ref Range   MRSA, PCR NEGATIVE NEGATIVE   Staphylococcus aureus NEGATIVE NEGATIVE  Glucose, capillary     Status: Abnormal   Collection Time: 09/12/22 10:55 AM  Result Value Ref Range   Glucose-Capillary 155 (H) 70 - 99 mg/dL  Glucose, capillary     Status: Abnormal   Collection Time: 09/12/22  4:00 PM  Result Value Ref Range   Glucose-Capillary 144 (H) 70 - 99 mg/dL  Glucose, capillary     Status: Abnormal   Collection Time: 09/12/22  7:56 PM  Result Value Ref Range   Glucose-Capillary 198 (H) 70 - 99 mg/dL  CBC     Status: Abnormal   Collection Time: 09/13/22  3:33 AM   Result Value Ref Range   WBC 6.6 4.0 - 10.5 K/uL   RBC 2.20 (L) 3.87 - 5.11 MIL/uL   Hemoglobin 7.3 (L) 12.0 - 15.0 g/dL   HCT 21.8 (L) 36.0 - 46.0 %   MCV 99.1 80.0 - 100.0 fL   MCH 33.2 26.0 - 34.0 pg   MCHC 33.5 30.0 - 36.0 g/dL   RDW 13.2 11.5 - 15.5 %   Platelets 239 150 - 400 K/uL   nRBC 0.0 0.0 - 0.2 %  Glucose, capillary     Status: Abnormal   Collection Time: 09/13/22  7:39 AM  Result Value Ref Range   Glucose-Capillary 169 (H) 70 - 99 mg/dL     PHYSICAL EXAM:   Gen: resting comfortably in bed, sitting up  Ext:       Left Lower Extremity              Short leg splint fitting  Ext warm              she is able to flex and ext her toes   Minimal swelling      Assessment/Plan: 1 Day Post-Op     Anti-infectives (From admission, onward)    Start     Dose/Rate Route Frequency Ordered Stop   09/12/22 2200  ceFAZolin (ANCEF) IVPB 2g/100 mL premix        2 g 200 mL/hr over 30 Minutes Intravenous Every 8 hours 09/12/22 1644 09/13/22 2159   09/12/22 1100  ceFAZolin (ANCEF) IVPB 2g/100 mL premix        2 g 200 mL/hr over 30 Minutes Intravenous  Once 09/11/22 1707 09/12/22 1049   09/08/22 1400  cephALEXin (KEFLEX) capsule 500 mg  Status:  Discontinued        500 mg Oral Every 8 hours 09/08/22 1152 09/12/22 1651   09/07/22 2230  cefTRIAXone (ROCEPHIN) 1 g in sodium chloride 0.9 % 100 mL IVPB  Status:  Discontinued        1 g 200 mL/hr over 30 Minutes Intravenous Every 24 hours 09/07/22 2132 09/08/22 1152     .  POD/HD#: 1  82 y/o female with complex medical history including chronic R leg weakness (presumably from Lumbar pathology which has been addressed surgically, Dr. Zada Finders), deconditioning, diabetes with peripheral neuropathy s/p fall with acute L bimalleolar ankle fracture dislocation    -L trimalleolar ankle fracture dislocation s/p ORIF plus repair of syndesmosis             NWB L leg x 6-8 weeks  Splint x 2 weeks then cam    PT/OT   Really lift or slide tranfers due to profound weakness in R leg    High fall risk   Ice and elevate                   - Pain management:             Multimodal    - Medical issues              Per primary    - DVT/PE prophylaxis:            ok to resume home plavix    - Metabolic Bone Disease:             Dexa early 2023, scanned only left femoral neck which showed osteopenia              Given R ankle fracture about 1 1/2 years ago and L1 compression fracture [id number] she would certainly meet criteria to start treatment for osteoporosis.  They plan to discuss with PCP   - Dispo:             Continue with current care             ortho issues stable  Follow up with OTS in 2 weeks for suture removal, xrays and removal of splint    Jari Pigg, PA-C [phone] (C) 09/13/2022, 10:50 AM  [facility] [place] [address] [phone] Jenetta [phone] (F)    After 5pm and on the weekends please log on to Amion, go to orthopaedics and the look under the [facility] Call for the provider(s) on call. You can also call our office at [phone] and then follow the prompts to be connected to the call team.  Patient ID: Teresa Daniels, female  DOB: 1940-11-25, 82 y.o.   MRN: [id number]

## 2022-09-13 NOTE — Progress Notes (Signed)
Occupational Therapy Treatment Patient Details Name: DESARAY SEPER MRN: [id number] DOB: 1940-11-22 Today's Date: 09/13/2022   History of present illness Pt is 82 yo female presenting to ED following a fall. PT hit her head but denies LOC. Deformity and pain in the L ankle. Now s/p left ORIF ankle fracture 3/5. Pt w/c bound at baseline due to recent lumbar surgery. Pt has 2 broken toes on her L foot. PMH includes recent lumbar surgery in January, asthma, CKD IV, depression, DM II, HTN, and stroke.   OT comments  Pt with modest progress toward established OT goals. Pt pleasant and conversational, but making moderate amount of inappropriate jokes during session, with decreased awareness of current functional deficits. Pt reporting she was interested in getting in wheelchair, but when offered, deferring reporting she would like to rest. Max education regarding importance of mobility for physical and cardiopulmonary health as well as return to PLOF. Pt verbalizing understanding. Agreeable to bed level exercises (see below). Additionally providing education regarding pressure relief due to pt has been in bed for several days. Pt verbalizing and demonstrating understanding; able to relieve pressure with multiple methods.    Recommendations for follow up therapy are one component of a multi-disciplinary discharge planning process, led by the attending physician.  Recommendations may be updated based on patient status, additional functional criteria and insurance authorization.    Follow Up Recommendations  Skilled nursing-short term rehab (<3 hours/day)     Assistance Recommended at Discharge Intermittent Supervision/Assistance  Patient can return home with the following  A lot of help with walking and/or transfers;A lot of help with bathing/dressing/bathroom;Assistance with cooking/housework;Assist for transportation;Help with stairs or ramp for entrance   Equipment Recommendations  Other (comment)  (defer)    Recommendations for Other Services      Precautions / Restrictions Precautions Precautions: Fall Restrictions Weight Bearing Restrictions: Yes LLE Weight Bearing: Non weight bearing Other Position/Activity Restrictions: broken toes on the R foot       Mobility Bed Mobility Overal bed mobility: Needs Assistance Bed Mobility: Supine to Sit     Supine to sit: Supervision, HOB elevated (to 30 degrees)     General bed mobility comments: Able to come to long sit in bed with supervision    Transfers                   General transfer comment: Pt refused     Balance Overall balance assessment: Needs assistance Sitting-balance support: Bilateral upper extremity supported Sitting balance-Leahy Scale: Fair                                     ADL either performed or assessed with clinical judgement   ADL Overall ADL's : Needs assistance/impaired                                       General ADL Comments: Pt continues to be self limitng reporting she does not think she needs to get OOB until she goes to rehab. Pt agreeable to education and EU exercises. Providing max education on pressure relief and repositioning as pt has not been mobilizing much. Pt able to demonstrate use of various pressure relief methods including positioning self in bed, using BUE to shift weight L, R, or for depression to releive pressure, and having staff place  pillows under hips to offset weight.    Extremity/Trunk Assessment Upper Extremity Assessment Upper Extremity Assessment: Generalized weakness   Lower Extremity Assessment Lower Extremity Assessment: Defer to PT evaluation        Vision   Vision Assessment?: No apparent visual deficits   Perception     Praxis      Cognition Arousal/Alertness: Awake/alert Behavior During Therapy: WFL for tasks assessed/performed Overall Cognitive Status: Impaired/Different from baseline Area of  Impairment: Following commands, Safety/judgement, Awareness, Problem solving                       Following Commands: Follows one step commands consistently, Follows one step commands with increased time Safety/Judgement: Decreased awareness of safety, Decreased awareness of deficits Awareness: Intellectual, Emergent Problem Solving: Requires verbal cues, Requires tactile cues, Slow processing General Comments: Pt is not aware of extent of deficits, and exhibits frequent self limiting behavior. Believes that because she has weightbearing precautions, she should not move despite reassurance from multiple staff. Inappropriately joking thoughout session, but overall able to recall most information reviewed at end of session        Exercises Exercises: Other exercises Other Exercises Other Exercises: BUE shoulder flexion with therapist providing light resistance; BUE row with scapular retraction; in long sit, truncal extension; BUE composite flexion. BLE hip flexion to raise bil LE off bed.    Shoulder Instructions       General Comments      Pertinent Vitals/ Pain       Pain Assessment Pain Assessment: Faces Faces Pain Scale: Hurts little more Pain Location: RLE with WB at the toes Pain Descriptors / Indicators: Grimacing, Guarding Pain Intervention(s): Limited activity within patient's tolerance, Monitored during session  Home Living                                          Prior Functioning/Environment              Frequency  Min 2X/week        Progress Toward Goals  OT Goals(current goals can now be found in the care plan section)  Progress towards OT goals: Progressing toward goals  Acute Rehab OT Goals Patient Stated Goal: go to rehab and then move OT Goal Formulation: With patient Time For Goal Achievement: 09/23/22 Potential to Achieve Goals: Fair ADL Goals Pt Will Perform Upper Body Dressing: with modified  independence;sitting Pt Will Perform Lower Body Dressing: sitting/lateral leans;with set-up Pt Will Transfer to Toilet: bedside commode;squat pivot transfer;with max assist Pt/caregiver will Perform Home Exercise Program: Both right and left upper extremity;With written HEP provided;Increased strength  Plan Discharge plan remains appropriate;Frequency remains appropriate    Co-evaluation                 AM-PAC OT "6 Clicks" Daily Activity     Outcome Measure   Help from another person eating meals?: None Help from another person taking care of personal grooming?: None Help from another person toileting, which includes using toliet, bedpan, or urinal?: Total Help from another person bathing (including washing, rinsing, drying)?: A Lot Help from another person to put on and taking off regular upper body clothing?: A Little Help from another person to put on and taking off regular lower body clothing?: A Lot 6 Click Score: 16    End of Session    OT Visit  Diagnosis: Unsteadiness on feet (R26.81);Muscle weakness (generalized) (M62.81);Other abnormalities of gait and mobility (R26.89);Repeated falls (R29.6);History of falling (Z91.81);Pain Pain - Right/Left: Left Pain - part of body: Ankle and joints of foot   Activity Tolerance Patient tolerated treatment well   Patient Left in bed;with call bell/phone within reach;with bed alarm set   Nurse Communication Mobility status        Time: 1600-1620 OT Time Calculation (min): 20 min  Charges: OT General Charges $OT Visit: 1 Visit OT Treatments $Self Care/Home Management : 8-22 mins  Elder Cyphers, OTR/L [facility] Acute Rehabilitation Office: [phone]   Magnus Ivan 09/13/2022, 5:18 PM

## 2022-09-13 NOTE — Progress Notes (Signed)
PT Cancellation Note  Patient Details Name: Teresa Daniels MRN: [id number] DOB: May 06, 1941   Cancelled Treatment:    Reason Eval/Treat Not Completed: (P) Patient declined, no reason specified (Pt reports that she is tired from surgery yesterday and would like to rest in bed today, but will participate in therapy tomorrow.)   Michelle Nasuti 09/13/2022, 1:09 PM

## 2022-09-13 NOTE — TOC Progression Note (Signed)
Transition of Care [facility]) - Progression Note    Patient Details  Name: Teresa Daniels MRN: [id number] Date of Birth: 1941-04-14  Transition of Care [facility]) CM/SW [place], [place] Phone Number: 09/13/2022, 10:39 AM  Clinical Narrative:    CSW following for DC to [facility] when medically ready. Will need post surgery PT OT prior to starting auth. Facility updated. TOC will continue to follow.    Expected Discharge Plan: [place] Barriers to Discharge: Continued Medical Work up, Ship broker, SNF Pending bed offer  Expected Discharge Plan and [place] Choice: [place] arrangements for the past 2 months: [place]                                       Social Determinants of Health (SDOH) Interventions SDOH Screenings   Food Insecurity: No Food Insecurity (07/25/2022)  Housing: Low Risk  (07/25/2022)  Transportation Needs: No Transportation Needs (07/25/2022)  Utilities: Not At Risk (07/25/2022)  Tobacco Use: Medium Risk (09/12/2022)    Readmission Risk Interventions     No data to display

## 2022-09-13 NOTE — Progress Notes (Signed)
PROGRESS NOTE  Teresa Daniels  DOB: 1941-06-17  PCP: [name], PA-C [id number]  DOA: 09/07/2022  LOS: 2 days  Hospital Day: 7  Brief narrative: KRISELLE Daniels is a 82 y.o. female with PMH significant for DM2, HTN, HLD, stroke, CKD, peripheral neuropathy, GERD, chronic anemia, depression, spondylolisthesis s/p laminectomy who lives in an independent living facility.  She had a recent fall and had laceration of the right foot with fracture of the small toe.  2/29, patient was brought to the ED after a fall fall and severe left ankle pain.   She was noted to have swelling, deformity and tenderness of the left ankle. X-ray showed bimalleolar fracture with dislocation of the left ankle.   Orthopedics consulted and recommended outpatient follow-up in a week to discuss about ORIF.   PT/OT recommended SNF and hence patient could not be discharged.  Subjective: Patient was seen and examined this morning Lying on bed.  Not in distress no new symptoms.  Feels tired after surgery yesterday. Pain controlled.  Assessment and plan: Left ankle trimalleolar fracture with dislocation Status post ORIF plus repair of syndesmosis -[name] 3/5 Secondary to a fall.   Surgery done.  Per orthopedics, nonweightbearing on left leg for 6 to 8 weeks.  Splint for 2 weeks then cam boot.  PT OT pending. Continue pain management with scheduled Tylenol, as needed oxycodone and as needed IV morphine  Recent right foot small toe fracture She recently fell and fractured her right small toe.  Right toenail has fallen.  Wound looks good.  Was taking Augmentin as an outpatient.  It was changed to Rocephin on admission, transitioned to Keflex on 09/08/2022.  Completed the course.  Recurrent falls Multifactorial : Deficits due to stroke, peripheral neuropathy, h/o chronic back pain/lumbar stenosis s/p laminectomy Currently mostly wheelchair-bound. SNF recommended by PT  Chronic anemia Baseline hemoglobin between  8 and 9.  Continues to be at baseline.  Continue monitor. Recent Labs    09/07/22 2008 09/08/22 0552 09/09/22 0430 09/10/22 0430 09/11/22 0257 09/12/22 0303 09/13/22 0333  HGB  --    < > 8.5* 8.1* 7.8* 8.4* 7.3*  MCV  --    < > 97.2 99.6 100.4* 97.3 99.1  VITAMINB12 1,312*  --   --   --   --   --   --   FOLATE 10.9  --   --   --   --   --   --   FERRITIN 394*  --   --   --   --   --   --   TIBC 204*  --   --   --   --   --   --   IRON 51  --   --   --   --   --   --   RETICCTPCT 2.2  --   --   --   --   --   --    < > = values in this interval not displayed.    Type 2 diabetes mellitus Peripheral neuropathy A1c 6.6 on 09/07/2022 PTA on glipizide 2.5 mg daily Currently on sliding scale insulin with Accu-Cheks. Continue Neurontin Recent Labs  Lab 09/12/22 1055 09/12/22 1600 09/12/22 1956 09/13/22 0739 09/13/22 1246  GLUCAP 155* 144* 198* 169* 176*    H/o stroke HLD Continue statin.  Plavix resumed  CKD stage IIIa Currently kidney function at baseline.  Slightly elevated creatinine today.  Continue monitor. Recent Labs  01/25/22 1033 03/09/22 1239 03/11/22 0042 07/27/22 1150 09/07/22 1718 09/08/22 0552 09/10/22 0430 09/11/22 0257 09/12/22 0303  BUN 34* 35* 40*  --  '20 16 19 18 21  '$ CREATININE 1.69* 1.67* 1.59* 1.64* 1.28* 1.12* 1.08* 1.21* 1.41*    H/o combined systolic/diastolic CHF Essential hypertension Found to be dehydrated on presentation and was given gentle IV hydration.  Currently euvolemic. Most recent echo from 02/2022 showed EF of 50 to [id number], grade 1 diastolic dysfunction.  PTA on HCTZ 25 mg daily, losartan 50 mg daily Currently both on hold.  Continue IV hydration.   Chronic asthma Currently respiratory status is stable.  Continue home inhalers, bronchodilators, Singulair   Chronic macrocytic anemia Hemoglobin at baseline between 8 and 9.   Hemoglobin remains stable. Continue vitamin B12, iron supplement Recent Labs    09/07/22 2008  09/08/22 0552 09/09/22 0430 09/10/22 0430 09/11/22 0257 09/12/22 0303 09/13/22 0333  HGB  --    < > 8.5* 8.1* 7.8* 8.4* 7.3*  MCV  --    < > 97.2 99.6 100.4* 97.3 99.1  VITAMINB12 1,312*  --   --   --   --   --   --   FOLATE 10.9  --   --   --   --   --   --   FERRITIN 394*  --   --   --   --   --   --   TIBC 204*  --   --   --   --   --   --   IRON 51  --   --   --   --   --   --   RETICCTPCT 2.2  --   --   --   --   --   --    < > = values in this interval not displayed.    GERD Continue PPI   Depression/anxiety Continue Zoloft    Mobility: Needs PT eval postprocedure  Goals of care   Code Status: Full Code    DVT prophylaxis: SCDs Start: 09/07/22 1951   Antimicrobials: Perioperative antibiotics Fluid: NS at 71 mill per hour Consultants: Orthopedics Family Communication: None at bedside  Status is: Observation Level of care: Telemetry Medical   Dispo: Patient is from: Home              Anticipated d/c is to: Pending clinical course Continue in-hospital care because: Pending surgery   Scheduled Meds:  acetaminophen  1,000 mg Oral TID   atorvastatin  40 mg Oral QHS   budesonide (PULMICORT) nebulizer solution  0.25 mg Nebulization BID   clopidogrel  75 mg Oral Daily   gabapentin  300 mg Oral QPC supper   insulin aspart  0-5 Units Subcutaneous QHS   insulin aspart  0-9 Units Subcutaneous TID WC   montelukast  10 mg Oral QPC supper   multivitamin with minerals  1 tablet Oral Daily   mupirocin ointment   Topical BID   pantoprazole  40 mg Oral QAC breakfast   Ensure Max Protein  11 oz Oral BID   sertraline  100 mg Oral QPC supper    PRN meds: methocarbamol **OR** methocarbamol (ROBAXIN) IV, morphine injection, oxyCODONE   Infusions:   sodium chloride 75 mL/hr at 09/13/22 0536    ceFAZolin (ANCEF) IV 2 g (09/13/22 1329)   methocarbamol (ROBAXIN) IV      Diet:  Diet Order  Diet Carb Modified Fluid consistency: Thin; Room service  appropriate? Yes  Diet effective now                   Antimicrobials: Anti-infectives (From admission, onward)    Start     Dose/Rate Route Frequency Ordered Stop   09/12/22 2200  ceFAZolin (ANCEF) IVPB 2g/100 mL premix        2 g 200 mL/hr over 30 Minutes Intravenous Every 8 hours 09/12/22 1644 09/13/22 2159   09/12/22 1100  ceFAZolin (ANCEF) IVPB 2g/100 mL premix        2 g 200 mL/hr over 30 Minutes Intravenous  Once 09/11/22 1707 09/12/22 1049   09/08/22 1400  cephALEXin (KEFLEX) capsule 500 mg  Status:  Discontinued        500 mg Oral Every 8 hours 09/08/22 1152 09/12/22 1651   09/07/22 2230  cefTRIAXone (ROCEPHIN) 1 g in sodium chloride 0.9 % 100 mL IVPB  Status:  Discontinued        1 g 200 mL/hr over 30 Minutes Intravenous Every 24 hours 09/07/22 2132 09/08/22 1152       Skin assessment:       Nutritional status:  Body mass index is 26.41 kg/m.  Nutrition Problem: Increased nutrient needs Etiology: hip fracture Signs/Symptoms: estimated needs     Objective: Vitals:   09/13/22 0424 09/13/22 0740  BP: (!) 121/52 (!) 116/55  Pulse: 80 82  Resp: 16 16  Temp: 98.3 F (36.8 C) 99 F (37.2 C)  SpO2: 97% 93%    Intake/Output Summary (Last 24 hours) at 09/13/2022 1339 Last data filed at 09/13/2022 0745 Gross per 24 hour  Intake 1999.27 ml  Output 900 ml  Net 1099.27 ml    Filed Weights   09/07/22 1114  Weight: 72 kg   Weight change:  Body mass index is 26.41 kg/m.   Physical Exam: General exam: Pleasant elderly Caucasian female. Skin: No rashes, lesions or ulcers. HEENT: Atraumatic, normocephalic, no obvious bleeding Lungs: Clear to auscultation bilaterally CVS: Regular rate and rhythm, no murmur GI/Abd soft, nontender, nondistended, bowel sound present.   CNS: Alert, awake, oriented x 3 Psychiatry: Mood appropriate Extremities: No pedal edema, no calf tenderness, left ankle fracture on a splint  Data Review: I have personally reviewed the  laboratory data and studies available.  F/u labs ordered Unresulted Labs (From admission, onward)     Start     Ordered   09/14/22 0500  CBC with Differential/Platelet  Tomorrow morning,   R        09/13/22 0847   09/14/22 [id number]  Basic metabolic panel  Tomorrow morning,   R        09/13/22 0847            Total time spent in review of labs and imaging, patient evaluation, formulation of plan, documentation and communication with family: 49 minutes  Signed, Terrilee Croak, MD [facility] 09/13/2022

## 2022-09-14 DIAGNOSIS — S82892A Other fracture of left lower leg, initial encounter for closed fracture: Secondary | ICD-10-CM | POA: Diagnosis not present

## 2022-09-14 LAB — GLUCOSE, CAPILLARY
Glucose-Capillary: 163 mg/dL — ABNORMAL HIGH (ref 70–99)
Glucose-Capillary: 110 mg/dL — ABNORMAL HIGH (ref 70–99)
Glucose-Capillary: 141 mg/dL — ABNORMAL HIGH (ref 70–99)
Glucose-Capillary: 176 mg/dL — ABNORMAL HIGH (ref 70–99)

## 2022-09-14 LAB — CBC WITH DIFFERENTIAL/PLATELET
Abs Immature Granulocytes: 0.03 10*3/uL (ref 0.00–0.07)
Basophils Absolute: 0 10*3/uL (ref 0.0–0.1)
Basophils Relative: 1 %
Eosinophils Absolute: 0.3 10*3/uL (ref 0.0–0.5)
Eosinophils Relative: 5 %
HCT: 21.5 % — ABNORMAL LOW (ref 36.0–46.0)
Hemoglobin: 7 g/dL — ABNORMAL LOW (ref 12.0–15.0)
Immature Granulocytes: 1 %
Lymphocytes Relative: 21 %
Lymphs Abs: 1.2 10*3/uL (ref 0.7–4.0)
MCH: 32.7 pg (ref 26.0–34.0)
MCHC: 32.6 g/dL (ref 30.0–36.0)
MCV: 100.5 fL — ABNORMAL HIGH (ref 80.0–100.0)
Monocytes Absolute: 0.5 10*3/uL (ref 0.1–1.0)
Monocytes Relative: 9 %
Neutro Abs: 3.7 10*3/uL (ref 1.7–7.7)
Neutrophils Relative %: 63 %
Platelets: 230 10*3/uL (ref 150–400)
RBC: 2.14 MIL/uL — ABNORMAL LOW (ref 3.87–5.11)
RDW: 13.4 % (ref 11.5–15.5)
WBC: 5.8 10*3/uL (ref 4.0–10.5)
nRBC: 0 % (ref 0.0–0.2)

## 2022-09-14 LAB — BASIC METABOLIC PANEL
Anion gap: 7 (ref 5–15)
BUN: 16 mg/dL (ref 8–23)
CO2: 27 mmol/L (ref 22–32)
Calcium: 8.5 mg/dL — ABNORMAL LOW (ref 8.9–10.3)
Chloride: 105 mmol/L (ref 98–111)
Creatinine, Ser: 1.16 mg/dL — ABNORMAL HIGH (ref 0.44–1.00)
GFR, Estimated: 47 mL/min — ABNORMAL LOW (ref >60)
Glucose, Bld: 143 mg/dL — ABNORMAL HIGH (ref 70–99)
Potassium: 4.3 mmol/L (ref 3.5–5.1)
Sodium: 139 mmol/L (ref 135–145)

## 2022-09-14 LAB — PREPARE RBC (CROSSMATCH)

## 2022-09-14 MED ORDER — SODIUM CHLORIDE 0.9% IV SOLUTION: Freq: Once | INTRAVENOUS | Status: AC

## 2022-09-14 NOTE — Care Management Important Message (Signed)
Important Message  Patient Details  Name: Teresa Daniels MRN: [id number] Date of Birth: 02-20-1941   Medicare Important Message Given:  Yes     Hannah Beat 09/14/2022, 10:54 AM

## 2022-09-14 NOTE — Progress Notes (Signed)
PROGRESS NOTE  Teresa Daniels  DOB: [birth year]  PCP: [name], PA-C [id number]  DOA: 09/07/2022  LOS: 3 days  Hospital Day: 8  Brief narrative: Teresa Daniels is a 82 y.o. female with PMH significant for DM2, HTN, HLD, stroke, CKD, peripheral neuropathy, GERD, chronic anemia, depression, spondylolisthesis s/p laminectomy who lives in an independent living facility.  She had a recent fall and had laceration of the right foot with fracture of the small toe.  2/29, patient was brought to the ED after a fall fall and severe left ankle pain.   She was noted to have swelling, deformity and tenderness of the left ankle. X-ray showed bimalleolar fracture with dislocation of the left ankle.   Orthopedics consulted and recommended outpatient follow-up in a week to discuss about ORIF.   PT/OT recommended SNF and hence patient could not be discharged.  Subjective: Patient was seen and examined this afternoon.  Pleasant elderly Caucasian female.  Sitting up in bed.  Taking her lunch.  Getting blood transfusion as well.  Assessment and plan: Left ankle trimalleolar fracture with dislocation Status post ORIF plus repair of syndesmosis -[name] 3/5 Secondary to a fall.   Surgery done.  Per orthopedics, nonweightbearing on left leg for 6 to 8 weeks.  Splint for 2 weeks then cam boot.  PT OT pending.. Continue pain management with scheduled Tylenol, as needed oxycodone and as needed IV morphine  Recent right foot small toe fracture She recently fell and fractured her right small toe.  Right toenail has fallen.  Wound looks good. Completed the course of antibiotics with Keflex.  Recurrent falls Multifactorial : Deficits due to stroke, peripheral neuropathy, h/o chronic back pain/lumbar stenosis s/p laminectomy Currently mostly wheelchair-bound. SNF recommended by PT  Chronic anemia Baseline hemoglobin between 8 and 9.  Hemoglobin dropped as well as 7 today.  1 unit of PRBC transfusion was  given.  Plavix held Continue vitamin B12, iron supplement Recent Labs    09/07/22 2008 09/08/22 0552 09/10/22 0430 09/11/22 0257 09/12/22 0303 09/13/22 0333 09/14/22 0330  HGB  --    < > 8.1* 7.8* 8.4* 7.3* 7.0*  MCV  --    < > 99.6 100.4* 97.3 99.1 100.5*  VITAMINB12 1,312*  --   --   --   --   --   --   FOLATE 10.9  --   --   --   --   --   --   FERRITIN 394*  --   --   --   --   --   --   TIBC 204*  --   --   --   --   --   --   IRON 51  --   --   --   --   --   --   RETICCTPCT 2.2  --   --   --   --   --   --    < > = values in this interval not displayed.   Type 2 diabetes mellitus Peripheral neuropathy A1c 6.6 on 09/07/2022 PTA on glipizide 2.5 mg daily Currently on sliding scale insulin with Accu-Cheks. Continue Neurontin. Recent Labs  Lab 09/13/22 0739 09/13/22 1246 09/13/22 1634 09/14/22 0727 09/14/22 1152  GLUCAP 169* 176* 143* 163* 110*   H/o stroke HLD Continue statin.  Plavix held for now.  CKD stage IIIa Currently kidney function at baseline.  Slightly elevated creatinine today.  Continue monitor. Recent Labs  01/25/22 1033 03/09/22 1239 03/11/22 0042 07/27/22 1150 09/07/22 1718 09/08/22 0552 09/10/22 0430 09/11/22 0257 09/12/22 0303 09/14/22 0330  BUN 34* 35* 40*  --  '20 16 19 18 21 16  '$ CREATININE 1.69* 1.67* 1.59* 1.64* 1.28* 1.12* 1.08* 1.21* 1.41* 1.16*   H/o combined systolic/diastolic CHF Essential hypertension Found to be dehydrated on presentation and was given gentle IV hydration.  Currently euvolemic. Most recent echo from 02/2022 showed EF of 50 to XX123456, grade 1 diastolic dysfunction.  PTA on HCTZ 25 mg daily, losartan 50 mg daily Currently both on hold.  Stop IV fluid   Chronic asthma Currently respiratory status is stable.  Continue home inhalers, bronchodilators, Singulair   GERD Continue PPI   Depression/anxiety Continue Zoloft    Mobility: Needs PT eval postprocedure  Goals of care   Code Status: Full Code     DVT prophylaxis: SCDs Start: 09/07/22 1951   Antimicrobials: Perioperative antibiotics Fluid: Stop IV fluid today Consultants: Orthopedics Family Communication: None at bedside  Status is: Observation Level of care: Telemetry Medical   Dispo: Patient is from: Home              Anticipated d/c is to: Pending PT eval Continue in-hospital care because: Pending PT eval   Scheduled Meds:  acetaminophen  1,000 mg Oral TID   atorvastatin  40 mg Oral QHS   budesonide (PULMICORT) nebulizer solution  0.25 mg Nebulization BID   gabapentin  300 mg Oral QPC supper   insulin aspart  0-5 Units Subcutaneous QHS   insulin aspart  0-9 Units Subcutaneous TID WC   montelukast  10 mg Oral QPC supper   multivitamin with minerals  1 tablet Oral Daily   mupirocin ointment   Topical BID   pantoprazole  40 mg Oral QAC breakfast   Ensure Max Protein  11 oz Oral BID   sertraline  100 mg Oral QPC supper    PRN meds: methocarbamol **OR** methocarbamol (ROBAXIN) IV, morphine injection, oxyCODONE   Infusions:   methocarbamol (ROBAXIN) IV      Diet:  Diet Order             Diet Carb Modified Fluid consistency: Thin; Room service appropriate? Yes  Diet effective now                   Antimicrobials: Anti-infectives (From admission, onward)    Start     Dose/Rate Route Frequency Ordered Stop   09/12/22 2200  ceFAZolin (ANCEF) IVPB 2g/100 mL premix        2 g 200 mL/hr over 30 Minutes Intravenous Every 8 hours 09/12/22 1644 09/13/22 1359   09/12/22 1100  ceFAZolin (ANCEF) IVPB 2g/100 mL premix        2 g 200 mL/hr over 30 Minutes Intravenous  Once 09/11/22 1707 09/12/22 1049   09/08/22 1400  cephALEXin (KEFLEX) capsule 500 mg  Status:  Discontinued        500 mg Oral Every 8 hours 09/08/22 1152 09/12/22 1651   09/07/22 2230  cefTRIAXone (ROCEPHIN) 1 g in sodium chloride 0.9 % 100 mL IVPB  Status:  Discontinued        1 g 200 mL/hr over 30 Minutes Intravenous Every 24 hours 09/07/22  2132 09/08/22 1152       Skin assessment:       Nutritional status:  Body mass index is 26.41 kg/m.  Nutrition Problem: Increased nutrient needs Etiology: hip fracture Signs/Symptoms: estimated needs  Objective: Vitals:   09/14/22 1006 09/14/22 1315  BP: (!) 130/59 138/78  Pulse: 73 81  Resp: 18 18  Temp: 98.5 F (36.9 C) 97.6 F (36.4 C)  SpO2: 96% 97%    Intake/Output Summary (Last 24 hours) at 09/14/2022 1330 Last data filed at 09/14/2022 1315 Gross per 24 hour  Intake 1378.85 ml  Output 900 ml  Net 478.85 ml   Filed Weights   09/07/22 1114  Weight: 72 kg   Weight change:  Body mass index is 26.41 kg/m.   Physical Exam: General exam: Pleasant elderly Caucasian female.  Not in distress Skin: No rashes, lesions or ulcers. HEENT: Atraumatic, normocephalic, no obvious bleeding Lungs: Clear to auscultation bilaterally CVS: Regular rate and rhythm, no murmur GI/Abd soft, nontender, nondistended, bowel sound present.   CNS: Alert, awake, oriented x 3 Psychiatry: Mood appropriate Extremities: No pedal edema, no calf tenderness, left ankle fracture on a splint  Data Review: I have personally reviewed the laboratory data and studies available.  F/u labs ordered Unresulted Labs (From admission, onward)     Start     Ordered   Unscheduled  Basic metabolic panel  Tomorrow morning,   R        09/14/22 1330   Unscheduled  CBC with Differential/Platelet  Tomorrow morning,   R        09/14/22 1330            Total time spent in review of labs and imaging, patient evaluation, formulation of plan, documentation and communication with family: 44 minutes  Signed, Terrilee Croak, MD [facility] 09/14/2022

## 2022-09-14 NOTE — Progress Notes (Signed)
Physical Therapy Treatment Patient Details Name: Teresa Daniels MRN: [id number] DOB: [birth year] Today's Date: 09/14/2022   History of Present Illness Pt is 82 yo female presenting to ED following a fall. PT hit her head but denies LOC. Deformity and pain in the L ankle. Now s/p left ORIF ankle fracture 3/5. Pt w/c bound at baseline due to recent lumbar surgery. Pt has 2 broken toes on her L foot. PMH includes recent lumbar surgery in January, asthma, CKD IV, depression, DM II, HTN, and stroke.    PT Comments    Pt continues at Max A for transfers with sliding board. She requires Mod A to get to sitting EOB from supine due to RLE and LLE weakness. Pt has difficulty re-calling technique for slide board. Due to pt being significantly below baseline currently recommending a higher level of care and frequency of skilled therapy services on discharge from acute care hospital setting in order to decrease risk for immobility, skin break down, falls, injury and re-hospitalization. Pt left up in chair with alarm pad and lift pad under her with instructions to nursing on board for pt transfer technique.    Recommendations for follow up therapy are one component of a multi-disciplinary discharge planning process, led by the attending physician.  Recommendations may be updated based on patient status, additional functional criteria and insurance authorization.  Follow Up Recommendations  Skilled nursing-short term rehab (<3 hours/day) Can patient physically be transported by private vehicle: No   Assistance Recommended at Discharge Frequent or constant Supervision/Assistance  Patient can return home with the following Two people to help with walking and/or transfers;Assist for transportation;Help with stairs or ramp for entrance;Assistance with cooking/housework   Equipment Recommendations  Other (comment) (defer to post acute)    Recommendations for Other Services       Precautions / Restrictions  Precautions Precautions: Fall Restrictions Weight Bearing Restrictions: Yes LLE Weight Bearing: Non weight bearing Other Position/Activity Restrictions: broken toes on the R foot WBAT     Mobility  Bed Mobility Overal bed mobility: Needs Assistance Bed Mobility: Supine to Sit     Supine to sit: Mod assist     General bed mobility comments: Able to come to long sit in bed with supervision Requires Min A to get LE off of the bed and Mod A to scoot toward EOB Patient Response: Cooperative  Transfers Overall transfer level: Needs assistance Equipment used: Sliding board Transfers: Bed to chair/wheelchair/BSC            Lateral/Scoot Transfers: Max assist General transfer comment: Pt is Max A for lateral scoot with sliding board. She is able to minimally WB through the RLE to assist and requires frequent verbal cues to lean forward.    Ambulation/Gait     General Gait Details: unable to progress today due to current functional mobility       Balance Overall balance assessment: Needs assistance Sitting-balance support: Bilateral upper extremity supported Sitting balance-Leahy Scale: Fair Sitting balance - Comments: no LOB, during dynamic scooting pt had no significant directional LOB.        Cognition Arousal/Alertness: Awake/alert Behavior During Therapy: WFL for tasks assessed/performed Overall Cognitive Status: Impaired/Different from baseline Area of Impairment: Following commands, Safety/judgement, Awareness, Problem solving     Following Commands: Follows one step commands consistently, Follows one step commands with increased time Safety/Judgement: Decreased awareness of safety, Decreased awareness of deficits   Problem Solving: Requires verbal cues, Requires tactile cues, Slow processing  General Comments General comments (skin integrity, edema, etc.): Pt was left in recliner at end of session with lift pad under pt for nursing to return  to bed when pt is ready. Recommended pt stay up for 30 min to 1 hour.      Pertinent Vitals/Pain Pain Assessment Pain Assessment: Faces Faces Pain Scale: Hurts little more Pain Location: RLE with WB at the toes Pain Descriptors / Indicators: Grimacing, Guarding Pain Intervention(s): Monitored during session, Limited activity within patient's tolerance     PT Goals (current goals can now be found in the care plan section) Acute Rehab PT Goals Patient Stated Goal: Pt would like to go back home. PT Goal Formulation: With patient Time For Goal Achievement: 09/22/22 Progress towards PT goals: Progressing toward goals    Frequency    Min 3X/week      PT Plan Current plan remains appropriate       AM-PAC PT "6 Clicks" Mobility   Outcome Measure  Help needed turning from your back to your side while in a flat bed without using bedrails?: A Little Help needed moving from lying on your back to sitting on the side of a flat bed without using bedrails?: A Lot Help needed moving to and from a bed to a chair (including a wheelchair)?: A Lot Help needed standing up from a chair using your arms (e.g., wheelchair or bedside chair)?: Total Help needed to walk in hospital room?: Total Help needed climbing 3-5 steps with a railing? : Total 6 Click Score: 10    End of Session Equipment Utilized During Treatment: Gait belt Activity Tolerance: Patient tolerated treatment well Patient left: in chair;with chair alarm set;with call bell/phone within reach Nurse Communication: Mobility status;Need for lift equipment;Weight bearing status PT Visit Diagnosis: Other abnormalities of gait and mobility (R26.89);History of falling (Z91.81)     Time: [id number] PT Time Calculation (min) (ACUTE ONLY): 25 min  Charges:  $Therapeutic Activity: 23-37 mins                     Tomma Rakers, DPT, CLT  Acute Rehabilitation Services Office: [phone] (Secure chat preferred)    Ander Purpura 09/14/2022, 3:58 PM

## 2022-09-14 NOTE — Progress Notes (Signed)
1 unit prbc transfused as ordered fro Hgb of 7.0. no s/s of transfusion reaction noted

## 2022-09-15 DIAGNOSIS — S82892A Other fracture of left lower leg, initial encounter for closed fracture: Secondary | ICD-10-CM | POA: Diagnosis not present

## 2022-09-15 LAB — BPAM RBC
Blood Product Expiration Date: 202403142359
ISSUE DATE / TIME: 202403070941
Unit Type and Rh: 9500

## 2022-09-15 LAB — BASIC METABOLIC PANEL
Anion gap: 3 — ABNORMAL LOW (ref 5–15)
BUN: 20 mg/dL (ref 8–23)
CO2: 30 mmol/L (ref 22–32)
Calcium: 8.7 mg/dL — ABNORMAL LOW (ref 8.9–10.3)
Chloride: 105 mmol/L (ref 98–111)
Creatinine, Ser: 0.98 mg/dL (ref 0.44–1.00)
GFR, Estimated: 58 mL/min — ABNORMAL LOW (ref >60)
Glucose, Bld: 144 mg/dL — ABNORMAL HIGH (ref 70–99)
Potassium: 4 mmol/L (ref 3.5–5.1)
Sodium: 138 mmol/L (ref 135–145)

## 2022-09-15 LAB — TYPE AND SCREEN
ABO/RH(D): A POS
Antibody Screen: NEGATIVE
Unit division: 0

## 2022-09-15 LAB — CBC WITH DIFFERENTIAL/PLATELET
Abs Immature Granulocytes: 0.04 10*3/uL (ref 0.00–0.07)
Basophils Absolute: 0.1 10*3/uL (ref 0.0–0.1)
Basophils Relative: 1 %
Eosinophils Absolute: 0.4 10*3/uL (ref 0.0–0.5)
Eosinophils Relative: 7 %
HCT: 26.6 % — ABNORMAL LOW (ref 36.0–46.0)
Hemoglobin: 9 g/dL — ABNORMAL LOW (ref 12.0–15.0)
Immature Granulocytes: 1 %
Lymphocytes Relative: 23 %
Lymphs Abs: 1.4 10*3/uL (ref 0.7–4.0)
MCH: 32.4 pg (ref 26.0–34.0)
MCHC: 33.8 g/dL (ref 30.0–36.0)
MCV: 95.7 fL (ref 80.0–100.0)
Monocytes Absolute: 0.5 10*3/uL (ref 0.1–1.0)
Monocytes Relative: 8 %
Neutro Abs: 3.8 10*3/uL (ref 1.7–7.7)
Neutrophils Relative %: 60 %
Platelets: 263 10*3/uL (ref 150–400)
RBC: 2.78 MIL/uL — ABNORMAL LOW (ref 3.87–5.11)
RDW: 15.6 % — ABNORMAL HIGH (ref 11.5–15.5)
WBC: 6.2 10*3/uL (ref 4.0–10.5)
nRBC: 0 % (ref 0.0–0.2)

## 2022-09-15 LAB — GLUCOSE, CAPILLARY
Glucose-Capillary: 147 mg/dL — ABNORMAL HIGH (ref 70–99)
Glucose-Capillary: 140 mg/dL — ABNORMAL HIGH (ref 70–99)
Glucose-Capillary: 129 mg/dL — ABNORMAL HIGH (ref 70–99)

## 2022-09-15 MED ORDER — OXYCODONE HCL 5 MG PO TABS: 5.0000 mg | ORAL_TABLET | Freq: Three times a day (TID) | ORAL | 0 refills | Status: DC | PRN

## 2022-09-15 MED ORDER — ENSURE ENLIVE PO LIQD: 237.0000 mL | Freq: Two times a day (BID) | ORAL | 12 refills | Status: AC

## 2022-09-15 MED ORDER — ENSURE ENLIVE PO LIQD
237.0000 mL | Freq: Two times a day (BID) | ORAL | Status: DC
  Administered 2022-09-15: 237 mL via ORAL

## 2022-09-15 MED ORDER — OXYCODONE HCL 5 MG PO TABS: 5.0000 mg | ORAL_TABLET | Freq: Three times a day (TID) | ORAL | 0 refills | Status: AC | PRN

## 2022-09-15 MED ORDER — METHOCARBAMOL 500 MG PO TABS: 500.0000 mg | ORAL_TABLET | Freq: Four times a day (QID) | ORAL | Status: AC | PRN

## 2022-09-15 NOTE — TOC Progression Note (Signed)
Transition of Care [facility]) - Progression Note    Patient Details  Name: Teresa Daniels MRN: [id number] Date of Birth: Oct 02, 1940  Transition of Care [facility]) CM/SW [place], [place] Phone Number: 09/15/2022, 12:44 PM  Clinical Narrative:     CSW notified pt is medically stable to DC today. Facility can accept pt with an approved authorization. Auth pending at this time. TOC will continue to follow.   Expected Discharge Plan: [place] Barriers to Discharge: Continued Medical Work up, Ship broker, SNF Pending bed offer  Expected Discharge Plan and [place] Choice: [place] arrangements for the past 2 months: [place]                                       Social Determinants of Health (SDOH) Interventions SDOH Screenings   Food Insecurity: No Food Insecurity (07/25/2022)  Housing: Low Risk  (07/25/2022)  Transportation Needs: No Transportation Needs (07/25/2022)  Utilities: Not At Risk (07/25/2022)  Tobacco Use: Medium Risk (09/13/2022)    Readmission Risk Interventions     No data to display

## 2022-09-15 NOTE — Progress Notes (Signed)
Physical Therapy Treatment Patient Details Name: Teresa Daniels MRN: [id number] DOB: 08-06-1940 Today's Date: 09/15/2022   History of Present Illness Pt is 82 yo female presenting to ED following a fall. PT hit her head but denies LOC. Deformity and pain in the L ankle. Now s/p left ORIF ankle fracture 3/5. Pt w/c bound at baseline due to recent lumbar surgery. Pt has 2 broken toes on her L foot. PMH includes recent lumbar surgery in January, asthma, CKD IV, depression, DM II, HTN, and stroke.    PT Comments    Pt is slowly progressing though she refused OOB transfer today she did scoot up EOB and was able to WB more through the RLE than she was previously. Pt was able to scoot about 5 inches at [facility] which is an improvement from previous session. Pt has improved bed mobility to Min A for supine to sitting and CGA for sitting to supine. Due to pt current functional status, PLOF, available assistance at home and home set up currently recommending an increased level of assistance and frequency of physical therapy on discharge from acute care hospital setting in order to decrease risk for immobility, skin break down, falls, injury and re-hospitalization.     Recommendations for follow up therapy are one component of a multi-disciplinary discharge planning process, led by the attending physician.  Recommendations may be updated based on patient status, additional functional criteria and insurance authorization.  Follow Up Recommendations  Skilled nursing-short term rehab (<3 hours/day) Can patient physically be transported by private vehicle: No   Assistance Recommended at Discharge Frequent or constant Supervision/Assistance  Patient can return home with the following Two people to help with walking and/or transfers;Assist for transportation;Help with stairs or ramp for entrance;Assistance with cooking/housework   Equipment Recommendations  Other (comment)    Recommendations for Other Services        Precautions / Restrictions Precautions Precautions: Fall Restrictions Weight Bearing Restrictions: Yes LLE Weight Bearing: Non weight bearing Other Position/Activity Restrictions: broken toes on the R foot WBAT     Mobility  Bed Mobility Overal bed mobility: Needs Assistance Bed Mobility: Supine to Sit     Supine to sit: Min assist Sit to supine: Min guard   General bed mobility comments: Pt was able to get to EOB with Min A at the RLE and with Min A at bil LE for sitting to supine. Significant increase in time to get to EOB Patient Response: Cooperative  Transfers Overall transfer level: Needs assistance Equipment used: None               General transfer comment: pt declined to get into the chair due to back pain. Scooted at EO ~5 inches with use of bil UE and RLE. Required assistance to get RLE flat on the floor; able to utilize with decreased pain following education to not use toes due to broken toes.    Ambulation/Gait               General Gait Details: unable to progress today due to current functional mobility       Balance Overall balance assessment: Needs assistance Sitting-balance support: Bilateral upper extremity supported Sitting balance-Leahy Scale: Fair Sitting balance - Comments: no LOB, during dynamic scooting pt had no significant directional LOB.        Cognition Arousal/Alertness: Awake/alert Behavior During Therapy: WFL for tasks assessed/performed Overall Cognitive Status: Impaired/Different from baseline Area of Impairment: Following commands, Safety/judgement, Awareness, Problem solving  Following Commands: Follows one step commands consistently, Follows one step commands with increased time Safety/Judgement: Decreased awareness of safety, Decreased awareness of deficits   Problem Solving: Requires verbal cues, Requires tactile cues, Slow processing General Comments: Pt is not aware of extent of deficits, and  exhibits frequent self limiting behavior.           General Comments General comments (skin integrity, edema, etc.): pt declined recliner today despite max encouragement.      Pertinent Vitals/Pain Pain Assessment Pain Score: 9  Pain Location: RLE with WB at the toes and with position changes in he RLE from the back pain 7/10 initially Pain Descriptors / Indicators: Grimacing, Guarding Pain Intervention(s): Monitored during session, Limited activity within patient's tolerance     PT Goals (current goals can now be found in the care plan section) Acute Rehab PT Goals Patient Stated Goal: Pt would like to go back home. PT Goal Formulation: With patient Time For Goal Achievement: 09/22/22 Potential to Achieve Goals: Fair Progress towards PT goals: Progressing toward goals    Frequency    Min 3X/week      PT Plan Current plan remains appropriate       AM-PAC PT "6 Clicks" Mobility   Outcome Measure  Help needed turning from your back to your side while in a flat bed without using bedrails?: A Little Help needed moving from lying on your back to sitting on the side of a flat bed without using bedrails?: A Little Help needed moving to and from a bed to a chair (including a wheelchair)?: A Lot Help needed standing up from a chair using your arms (e.g., wheelchair or bedside chair)?: Total Help needed to walk in hospital room?: Total Help needed climbing 3-5 steps with a railing? : Total 6 Click Score: 11    End of Session   Activity Tolerance: Patient limited by pain Patient left: in bed;with bed alarm set;with call bell/phone within reach Nurse Communication: Mobility status;Need for lift equipment;Weight bearing status PT Visit Diagnosis: Other abnormalities of gait and mobility (R26.89);History of falling (Z91.81)     Time: [id number] PT Time Calculation (min) (ACUTE ONLY): 27 min  Charges:  $Therapeutic Activity: 23-37 mins                    Tomma Rakers,  DPT, CLT  Acute Rehabilitation Services Office: [phone] (Secure chat preferred)    Ander Purpura 09/15/2022, 3:13 PM

## 2022-09-15 NOTE — Discharge Summary (Addendum)
Physician Discharge Summary  Teresa Daniels [id number] DOB: 10/06/1940 DOA: 09/07/2022  PCP: [name], PA-C  Admit date: 09/07/2022 Discharge date: 09/15/2022  Admitted From: Home Discharge disposition: SNF  Recommendations at discharge:  Pain meds as needed   Brief narrative: Teresa Daniels is a 82 y.o. female with PMH significant for DM2, HTN, HLD, stroke, CKD, peripheral neuropathy, GERD, chronic anemia, depression, spondylolisthesis s/p laminectomy who lives in an independent living facility.  She had a recent fall and had laceration of the right foot with fracture of the small toe.  2/29, patient was brought to the ED after a fall fall and severe left ankle pain.   She was noted to have swelling, deformity and tenderness of the left ankle. X-ray showed bimalleolar fracture with dislocation of the left ankle.   Orthopedics consulted and recommended outpatient follow-up in a week to discuss about ORIF.   PT/OT recommended SNF and hence patient could not be discharged.  Subjective: Patient was seen and examined this morning.  Sitting up in bed.  Taking her lunch. Pain controlled. Pending SNF  Assessment and plan: Left ankle trimalleolar fracture with dislocation Status post ORIF plus repair of syndesmosis - [name] 3/5 Secondary to a fall.   Surgery done.  Per orthopedics, nonweightbearing on left leg for 6 to 8 weeks.  Splint for 2 weeks then cam boot.  PT OT eval obtained.  SNF recommended.. Continue pain management with scheduled Tylenol, as needed oxycodone.  Recent right foot small toe fracture She recently fell and fractured her right small toe.  Right toenail has fallen.  Wound looks good. Completed the course of antibiotics with Keflex.  Recurrent falls Multifactorial: Deficits due to stroke, peripheral neuropathy, h/o chronic back pain/lumbar stenosis s/p laminectomy. Currently mostly wheelchair-bound. SNF recommended by PT  Chronic anemia Baseline  hemoglobin between 8 and 9.  Hemoglobin dropped as well as 7 on 3/7. 1 unit of PRBC transfusion was given.   Continue vitamin B12, iron supplement Recent Labs    09/07/22 2008 09/08/22 0552 09/11/22 0257 09/12/22 0303 09/13/22 0333 09/14/22 0330 09/15/22 0336  HGB  --    < > 7.8* 8.4* 7.3* 7.0* 9.0*  MCV  --    < > 100.4* 97.3 99.1 100.5* 95.7  VITAMINB12 1,312*  --   --   --   --   --   --   FOLATE 10.9  --   --   --   --   --   --   FERRITIN 394*  --   --   --   --   --   --   TIBC 204*  --   --   --   --   --   --   IRON 51  --   --   --   --   --   --   RETICCTPCT 2.2  --   --   --   --   --   --    < > = values in this interval not displayed.   Type 2 diabetes mellitus Peripheral neuropathy A1c 6.6 on 09/07/2022 PTA on glipizide 2.5 mg daily.  Resume the same. Continue sliding scale insulin with Accu-Cheks. Continue Neurontin. Recent Labs  Lab 09/14/22 1152 09/14/22 1530 09/14/22 2046 09/15/22 0821 09/15/22 1202  GLUCAP 110* 141* 176* 147* 140*   H/o stroke HLD Continue Plavix and statin  CKD stage IIIa Currently kidney function at baseline.  Slightly elevated creatinine  today.  Continue monitor. Recent Labs    01/25/22 1033 03/09/22 1239 03/11/22 0042 07/27/22 1150 09/07/22 1718 09/08/22 0552 09/10/22 0430 09/11/22 0257 09/12/22 0303 09/14/22 0330 09/15/22 0336  BUN 34* 35* 40*  --  '20 16 19 18 21 16 20  '$ CREATININE 1.69* 1.67* 1.59* 1.64* 1.28* 1.12* 1.08* 1.21* 1.41* 1.16* 0.98   H/o combined systolic/diastolic CHF Essential hypertension Found to be dehydrated on presentation and was given gentle IV hydration.  Currently euvolemic. Most recent echo from 02/2022 showed EF of 50 to [id number], grade 1 diastolic dysfunction.  PTA on HCTZ 25 mg daily, losartan 50 mg daily Currently both on hold.  Blood pressure running elevated.  Resume both at discharge.   Chronic asthma Currently respiratory status is stable.  Continue home inhalers, bronchodilators,  Singulair   GERD Continue PPI   Depression/anxiety Continue Zoloft  Impaired mobility PT eval obtained.  SNF recommended.  Goals of care   Code Status: Full Code   Wounds:  - Wound / Incision (Open or Dehisced) 09/07/22 Laceration Toe (Comment  which one) Anterior;Right Laceration 5th right toe (Active)  Date First Assessed/Time First Assessed: 09/07/22 2114   Wound Type: Laceration  Location: Toe (Comment  which one)  Location Orientation: Anterior;Right  Wound Description (Comments): Laceration 5th right toe  Present on Admission: Yes    Assessments 09/08/2022 10:15 AM 09/15/2022  9:05 AM  Dressing Type None None  Dressing Status -- None;Clean, Dry, Intact  Dressing Change Frequency -- Every 3 days  Site / Wound Assessment Clean;Dry;Red Dressing in place / Unable to assess  Wound Length (cm) 1 cm --  Wound Width (cm) 1 cm --  Wound Surface Area (cm^2) 1 cm^2 --     No associated orders.     Incision (Closed) 09/12/22 Ankle Left (Active)  Date First Assessed/Time First Assessed: 09/12/22 1429   Location: Ankle  Location Orientation: Left    Assessments 09/12/2022  3:24 PM 09/15/2022  9:05 AM  Dressing Type Compression wrap Compression wrap  Dressing Clean, Dry, Intact Clean, Dry, Intact  Drainage Amount None None  Drainage Description -- No odor  Treatment -- Off loading     No associated orders.    Discharge Exam:   Vitals:   09/14/22 2053 09/15/22 0528 09/15/22 0741 09/15/22 1610  BP: (!) 140/97 (!) 150/83 130/76 139/67  Pulse: 80 80 75 72  Resp: '18 18 19 16  '$ Temp: 98.5 F (36.9 C) 98.3 F (36.8 C) 98.8 F (37.1 C) 98.5 F (36.9 C)  TempSrc: Oral Oral Oral Oral  SpO2: 96% 98% 100% 97%  Weight:      Height:        Body mass index is 26.41 kg/m.   General exam: Pleasant elderly Caucasian female.  Not in distress Skin: No rashes, lesions or ulcers. HEENT: Atraumatic, normocephalic, no obvious bleeding Lungs: Clear to auscultation bilaterally CVS: Regular  rate and rhythm, no murmur GI/Abd soft, nontender, nondistended, bowel sound present.   CNS: Alert, awake, oriented x 3 Psychiatry: Mood appropriate Extremities: No pedal edema, no calf tenderness, left ankle fracture on a splint  Follow ups:    Contact information for follow-up providers     Altamese Bucyrus, MD. Schedule an appointment as soon as possible for a visit.   Specialty: Orthopedic Surgery Contact information: [place] [address] [phone]         [name], PA-C Follow up.   Specialty: Physician Assistant Contact information: [address] [place] [address] [phone]              Contact information for after-discharge care     Destination     [facility] Preferred SNF .   Service: Skilled Nursing Contact information: [place] [phone]                     Discharge Instructions:   Discharge Instructions     Call MD for:  difficulty breathing, headache or visual disturbances   Complete by: As directed    Call MD for:  extreme fatigue   Complete by: As directed    Call MD for:  hives   Complete by: As directed    Call MD for:  persistant dizziness or light-headedness   Complete by: As directed    Call MD for:  persistant nausea and vomiting   Complete by: As directed    Call MD for:  severe uncontrolled pain   Complete by: As directed    Call MD for:  temperature >100.4   Complete by: As directed    Diet - low sodium heart healthy   Complete by: As directed    Diet Carb Modified   Complete by: As directed    Discharge instructions   Complete by: As directed    Recommendations at discharge:   Pain meds as needed  General discharge instructions: Follow with Primary MD [name], PA-C in 7 days  Please request your PCP  to go over your hospital tests, procedures, radiology results at the follow up. Please get  your medicines reviewed and adjusted.  Your PCP may decide to repeat certain labs or tests as needed. Do not drive, operate heavy machinery, perform activities at heights, swimming or participation in water activities or provide baby sitting services if your were admitted for syncope or siezures until you have seen by Primary MD or a Neurologist and advised to do so again. [place] Controlled Substance Reporting System database was reviewed. Do not drive, operate heavy machinery, perform activities at heights, swim, participate in water activities or provide baby-sitting services while on medications for pain, sleep and mood until your outpatient physician has reevaluated you and advised to do so again.  You are strongly recommended to comply with the dose, frequency and duration of prescribed medications. Activity: As tolerated with Full fall precautions use walker/cane & assistance as needed Avoid using any recreational substances like cigarette, tobacco, alcohol, or non-prescribed drug. If you experience worsening of your admission symptoms, develop shortness of breath, life threatening emergency, suicidal or homicidal thoughts you must seek medical attention immediately by calling 911 or calling your MD immediately  if symptoms less severe. You must read complete instructions/literature along with all the possible adverse reactions/side effects for all the medicines you take and that have been prescribed to you. Take any new medicine only after you have completely understood and accepted all the possible adverse reactions/side effects.  Wear Seat belts while driving. You were cared for by a hospitalist during your hospital stay. If you have any questions about your discharge medications or the care you received while you were in the hospital after you are discharged, you can call the unit and ask to speak with the hospitalist or the covering physician. Once you are discharged, your primary care  physician will handle any further medical issues. Please note that NO REFILLS for any discharge medications will be authorized once you  are discharged, as it is imperative that you return to your primary care physician (or establish a relationship with a primary care physician if you do not have one).   Discharge wound care:   Complete by: As directed    Increase activity slowly   Complete by: As directed    Non weight bearing   Complete by: As directed    Laterality: left   Extremity: Lower       Discharge Medications:   Allergies as of 09/15/2022       Reactions   Cat Hair Extract Other (See Comments)   Runny nose        Medication List     STOP taking these medications    amoxicillin-clavulanate 875-125 MG tablet Commonly known as: AUGMENTIN   cyclobenzaprine 10 MG tablet Commonly known as: FLEXERIL   diphenhydramine-acetaminophen 25-500 MG Tabs tablet Commonly known as: TYLENOL PM       TAKE these medications    acetaminophen 500 MG tablet Commonly known as: TYLENOL Take 1,000-1,500 mg by mouth every 8 (eight) hours as needed for moderate pain.   albuterol 108 (90 Base) MCG/ACT inhaler Commonly known as: VENTOLIN HFA Inhale 2 puffs into the lungs every 6 (six) hours as needed for wheezing or shortness of breath.   atorvastatin 40 MG tablet Commonly known as: LIPITOR Take 40 mg by mouth at bedtime.   B-12 PO Take 1 tablet by mouth 3 (three) times a week.   beclomethasone 40 MCG/ACT inhaler Commonly known as: QVAR Inhale 1 puff into the lungs 2 (two) times daily as needed (shortness of breath).   cetirizine 10 MG tablet Commonly known as: ZYRTEC Take 10 mg by mouth daily.   clopidogrel 75 MG tablet Commonly known as: PLAVIX Take 1 tablet (75 mg total) by mouth in the morning. Restart on 04/07/22   feeding supplement Liqd Take 237 mLs by mouth 2 (two) times daily between meals.   gabapentin 300 MG capsule Commonly known as: NEURONTIN Take 300  mg by mouth daily after supper.   glipiZIDE 2.5 MG 24 hr tablet Commonly known as: GLUCOTROL XL Take 2.5 mg by mouth in the morning.   hydrochlorothiazide 25 MG tablet Commonly known as: HYDRODIURIL Take 25 mg by mouth daily.   ICY HOT EX Apply 1 Application topically daily as needed (pain).   IRON PO Take 1 capsule by mouth daily.   Krill Oil 500 MG Caps Take by mouth.   losartan 50 MG tablet Commonly known as: COZAAR Take 50 mg by mouth daily.   MAGNESIUM PO Take 1 tablet by mouth daily.   methocarbamol 500 MG tablet Commonly known as: ROBAXIN Take 1 tablet (500 mg total) by mouth every 6 (six) hours as needed for muscle spasms.   montelukast 10 MG tablet Commonly known as: SINGULAIR Take 10 mg by mouth daily after supper.   mupirocin ointment 2 % Commonly known as: BACTROBAN SMARTSIG:1 Application Topical 2-3 Times Daily   oxyCODONE 5 MG immediate release tablet Commonly known as: Oxy IR/ROXICODONE Take 1 tablet (5 mg total) by mouth every 8 (eight) hours as needed for severe pain. What changed: when to take this   pantoprazole 40 MG tablet Commonly known as: PROTONIX Take 40 mg by mouth daily before breakfast.   sertraline 100 MG tablet Commonly known as: ZOLOFT Take 100 mg by mouth daily after supper.   VITAMIN D PO Take 1 tablet by mouth daily.   Voltaren 1 % Gel Generic drug:  diclofenac Sodium Apply 2 g topically daily as needed (pain).               Discharge Care Instructions  (From admission, onward)           Start     Ordered   09/15/22 0000  Discharge wound care:        09/15/22 1321   09/07/22 0000  Non weight bearing       Question Answer Comment  Laterality left   Extremity Lower      09/07/22 1544             The results of significant diagnostics from this hospitalization (including imaging, microbiology, ancillary and laboratory) are listed below for reference.    Procedures and Diagnostic Studies:   VAS  [place] LOWER EXTREMITY VENOUS (DVT) (7a-7p)  Result Date: 09/08/2022  Lower Venous DVT Study Patient Name:  ETHIE YANNI  Date of Exam:   09/07/2022 Medical Rec #: [id number]       Accession #:    [id number] Date of Birth: 1940/08/15      Patient Gender: F Patient Age:   36 years Exam Location:  [facility] Procedure:      VAS [place] LOWER EXTREMITY VENOUS (DVT) Referring Phys: Leanord Asal --------------------------------------------------------------------------------  Indications: Right calf erythema, swelling.  Comparison Study: No prior studies. Performing Technologist: Darlin Coco RDMS, RVT  Examination Guidelines: A complete evaluation includes B-mode imaging, spectral Doppler, color Doppler, and power Doppler as needed of all accessible portions of each vessel. Bilateral testing is considered an integral part of a complete examination. Limited examinations for reoccurring indications may be performed as noted. The reflux portion of the exam is performed with the patient in reverse Trendelenburg.  +---------+---------------+---------+-----------+----------+--------------+ RIGHT    CompressibilityPhasicitySpontaneityPropertiesThrombus Aging +---------+---------------+---------+-----------+----------+--------------+ CFV      Full           Yes      Yes                                 +---------+---------------+---------+-----------+----------+--------------+ SFJ      Full                                                        +---------+---------------+---------+-----------+----------+--------------+ FV Prox  Full                                                        +---------+---------------+---------+-----------+----------+--------------+ FV Mid   Full                                                        +---------+---------------+---------+-----------+----------+--------------+ FV DistalFull                                                         +---------+---------------+---------+-----------+----------+--------------+  PFV      Full                                                        +---------+---------------+---------+-----------+----------+--------------+ POP      Full           Yes      Yes                                 +---------+---------------+---------+-----------+----------+--------------+ PTV      Full                                                        +---------+---------------+---------+-----------+----------+--------------+ PERO     Full                                                        +---------+---------------+---------+-----------+----------+--------------+   +----+---------------+---------+-----------+----------+--------------+ LEFTCompressibilityPhasicitySpontaneityPropertiesThrombus Aging +----+---------------+---------+-----------+----------+--------------+ CFV Full           Yes      Yes                                 +----+---------------+---------+-----------+----------+--------------+     Summary: RIGHT: - There is no evidence of deep vein thrombosis in the lower extremity.  - No cystic structure found in the popliteal fossa.  LEFT: - No evidence of common femoral vein obstruction.  *See table(s) above for measurements and observations. Electronically signed by Monica Martinez MD on 09/08/2022 at 12:40:39 PM.    Final    DG Ankle 2 Views Left  Result Date: 09/07/2022 CLINICAL DATA:  Ankle fracture, postreduction. EXAM: LEFT ANKLE - 2 VIEW COMPARISON:  Preoperative radiograph earlier today. FINDINGS: Improved alignment of bimalleolar fracture postreduction. Overlying splint material limits osseous and soft tissue fine detail. The ankle mortise is currently congruent. No obvious posterior tibial tubercle fracture is seen. IMPRESSION: Improved alignment of bimalleolar fracture postreduction. Electronically Signed   By: Keith Rake M.D.   On: 09/07/2022 15:54   DG Knee  Left Port  Result Date: 09/07/2022 CLINICAL DATA:  Ankle fracture.  Fall. EXAM: PORTABLE LEFT KNEE - 1-2 VIEW COMPARISON:  None Available. FINDINGS: There is minimal joint space narrowing of the MEDIAL compartment. No acute fracture or subluxation. No significant joint effusion. No radiopaque foreign body. IMPRESSION: Minimal degenerative changes. No evidence for acute abnormality. Electronically Signed   By: Nolon Nations M.D.   On: 09/07/2022 15:52   CT Head Wo Contrast  Result Date: 09/07/2022 CLINICAL DATA:  Fall EXAM: CT HEAD WITHOUT CONTRAST CT CERVICAL SPINE WITHOUT CONTRAST TECHNIQUE: Multidetector CT imaging of the head and cervical spine was performed following the standard protocol without intravenous contrast. Multiplanar CT image reconstructions of the cervical spine were also generated. RADIATION DOSE REDUCTION: This exam was performed according to the departmental dose-optimization program which includes automated exposure control, adjustment of the mA and/or  kV according to patient size and/or use of iterative reconstruction technique. COMPARISON:  CT head and cervical spine 03/08/2018 FINDINGS: CT HEAD FINDINGS Brain: There is no acute intracranial hemorrhage, extra-axial fluid collection, or acute infarct Parenchymal volume is normal for age. The ventricles are normal in size. Gray-white differentiation is preserved. Patchy hypodensity in the supratentorial white matter likely reflects sequela of chronic small-vessel ischemic change. The pituitary and suprasellar region are normal. There is no mass lesion. There is no mass effect or midline shift. Vascular: There is calcification of the bilateral carotid siphons. Skull: Normal. Negative for fracture or focal lesion. Sinuses/Orbits: The paranasal sinuses are clear. Bilateral lens implants are in place. The globes and orbits are otherwise unremarkable. Other: None. CT CERVICAL SPINE FINDINGS Alignment: Normal. There is no jumped or perched  facet or other evidence of traumatic malalignment Skull base and vertebrae: Skull base alignment is maintained. Vertebral body heights are preserved. There is no evidence of acute fracture. There is no suspicious osseous lesion. Soft tissues and spinal canal: No prevertebral fluid or swelling. No visible canal hematoma. Disc levels: There is disc space narrowing and degenerative endplate change most advanced at C5-C6 with prominent anterior osteophytes throughout the remainder of the cervical spine. There is multilevel facet arthropathy most advanced on the left at C3-C4 through C5-C6. There is no evidence of high-grade spinal canal stenosis. Upper chest: There is right worse than left apical scarring. Other: None. IMPRESSION: 1. No acute intracranial pathology. 2. No acute fracture or traumatic malalignment of the cervical spine. Electronically Signed   By: Valetta Mole M.D.   On: 09/07/2022 12:47   CT Cervical Spine Wo Contrast  Result Date: 09/07/2022 CLINICAL DATA:  Fall EXAM: CT HEAD WITHOUT CONTRAST CT CERVICAL SPINE WITHOUT CONTRAST TECHNIQUE: Multidetector CT imaging of the head and cervical spine was performed following the standard protocol without intravenous contrast. Multiplanar CT image reconstructions of the cervical spine were also generated. RADIATION DOSE REDUCTION: This exam was performed according to the departmental dose-optimization program which includes automated exposure control, adjustment of the mA and/or kV according to patient size and/or use of iterative reconstruction technique. COMPARISON:  CT head and cervical spine 03/08/2018 FINDINGS: CT HEAD FINDINGS Brain: There is no acute intracranial hemorrhage, extra-axial fluid collection, or acute infarct Parenchymal volume is normal for age. The ventricles are normal in size. Gray-white differentiation is preserved. Patchy hypodensity in the supratentorial white matter likely reflects sequela of chronic small-vessel ischemic change.  The pituitary and suprasellar region are normal. There is no mass lesion. There is no mass effect or midline shift. Vascular: There is calcification of the bilateral carotid siphons. Skull: Normal. Negative for fracture or focal lesion. Sinuses/Orbits: The paranasal sinuses are clear. Bilateral lens implants are in place. The globes and orbits are otherwise unremarkable. Other: None. CT CERVICAL SPINE FINDINGS Alignment: Normal. There is no jumped or perched facet or other evidence of traumatic malalignment Skull base and vertebrae: Skull base alignment is maintained. Vertebral body heights are preserved. There is no evidence of acute fracture. There is no suspicious osseous lesion. Soft tissues and spinal canal: No prevertebral fluid or swelling. No visible canal hematoma. Disc levels: There is disc space narrowing and degenerative endplate change most advanced at C5-C6 with prominent anterior osteophytes throughout the remainder of the cervical spine. There is multilevel facet arthropathy most advanced on the left at C3-C4 through C5-C6. There is no evidence of high-grade spinal canal stenosis. Upper chest: There is right worse than left apical  scarring. Other: None. IMPRESSION: 1. No acute intracranial pathology. 2. No acute fracture or traumatic malalignment of the cervical spine. Electronically Signed   By: Valetta Mole M.D.   On: 09/07/2022 12:47   DG Ankle Complete Left  Result Date: 09/07/2022 CLINICAL DATA:  Trauma, fall EXAM: LEFT ANKLE COMPLETE - 3+ VIEW COMPARISON:  None Available. FINDINGS: There is bimalleolar fracture dislocation. Comminuted displaced fractures are seen in the base of medial malleolus and the distal fibula. There is lateral displacement of talus in relation to distal tibia. There is abnormal widening of joint space between the distal tibia and talus in the anterior aspect. IMPRESSION: Comminuted displaced fractures are seen in distal tibia and fibula. There is lateral dislocation  in the left ankle. Electronically Signed   By: Elmer Picker M.D.   On: 09/07/2022 11:40   DG Pelvis Portable  Result Date: 09/07/2022 CLINICAL DATA:  Trauma, fall EXAM: PORTABLE PELVIS 1-2 VIEWS COMPARISON:  None Available. FINDINGS: No displaced fracture or dislocation is seen. Degenerative changes are noted in lumbar spine. There is previous surgical fusion at the L4-L5 level. IMPRESSION: No fracture or dislocation is seen in pelvis. Electronically Signed   By: Elmer Picker M.D.   On: 09/07/2022 11:38   DG Chest Port 1 View  Result Date: 09/07/2022 CLINICAL DATA:  Status post fall. EXAM: PORTABLE CHEST 1 VIEW COMPARISON:  Radiographs 03/10/2022 and 02/06/2021. FINDINGS: 1122 hours. The heart size and mediastinal contours are stable. The lungs appear unchanged with mild chronic interstitial prominence. No edema, confluent airspace opacity, pleural effusion or pneumothorax. No acute fractures are identified. There are asymmetric glenohumeral degenerative changes on the left and mild thoracic spondylosis. Telemetry leads overlie the chest. IMPRESSION: No evidence of acute chest injury. Stable mild chronic interstitial prominence. Electronically Signed   By: Richardean Sale M.D.   On: 09/07/2022 11:38     Labs:   Basic Metabolic Panel: Recent Labs  Lab 09/10/22 0430 09/11/22 0257 09/12/22 0303 09/14/22 0330 09/15/22 0336  NA 137 137 136 139 138  K 4.0 4.1 4.4 4.3 4.0  CL 100 102 102 105 105  CO2 '28 27 29 27 30  '$ GLUCOSE 153* 156* 148* 143* 144*  BUN '19 18 21 16 20  '$ CREATININE 1.08* 1.21* 1.41* 1.16* 0.98  CALCIUM 8.6* 8.8* 8.9 8.5* 8.7*   GFR Estimated Creatinine Clearance: 44.8 mL/min (by C-G formula based on SCr of 0.98 mg/dL). Liver Function Tests: No results for input(s): "AST", "ALT", "ALKPHOS", "BILITOT", "PROT", "ALBUMIN" in the last 168 hours. No results for input(s): "LIPASE", "AMYLASE" in the last 168 hours. No results for input(s): "AMMONIA" in the last 168  hours. Coagulation profile No results for input(s): "INR", "PROTIME" in the last 168 hours.  CBC: Recent Labs  Lab 09/11/22 0257 09/12/22 0303 09/13/22 0333 09/14/22 0330 09/15/22 0336  WBC 6.0 5.7 6.6 5.8 6.2  NEUTROABS  --  3.2  --  3.7 3.8  HGB 7.8* 8.4* 7.3* 7.0* 9.0*  HCT 24.0* 24.9* 21.8* 21.5* 26.6*  MCV 100.4* 97.3 99.1 100.5* 95.7  PLT 230 258 239 230 263   Cardiac Enzymes: No results for input(s): "CKTOTAL", "CKMB", "CKMBINDEX", "TROPONINI" in the last 168 hours. BNP: Invalid input(s): "POCBNP" CBG: Recent Labs  Lab 09/14/22 1152 09/14/22 1530 09/14/22 2046 09/15/22 0821 09/15/22 1202  GLUCAP 110* 141* 176* 147* 140*   D-Dimer No results for input(s): "DDIMER" in the last 72 hours. Hgb A1c No results for input(s): "HGBA1C" in the last 72 hours. Lipid  Profile No results for input(s): "CHOL", "HDL", "LDLCALC", "TRIG", "CHOLHDL", "LDLDIRECT" in the last 72 hours. Thyroid function studies No results for input(s): "TSH", "T4TOTAL", "T3FREE", "THYROIDAB" in the last 72 hours.  Invalid input(s): "FREET3" Anemia work up No results for input(s): "VITAMINB12", "FOLATE", "FERRITIN", "TIBC", "IRON", "RETICCTPCT" in the last 72 hours. Microbiology Recent Results (from the past 240 hour(s))  Surgical pcr screen     Status: None   Collection Time: 09/12/22 10:52 AM   Specimen: Nasal Mucosa; Nasal Swab  Result Value Ref Range Status   MRSA, PCR NEGATIVE NEGATIVE Final   Staphylococcus aureus NEGATIVE NEGATIVE Final    Comment: (NOTE) The Xpert SA Assay (FDA approved for NASAL specimens in patients 17 years of age and older), is one component of a comprehensive surveillance program. It is not intended to diagnose infection nor to guide or monitor treatment. Performed at [facility], [place] [address] [place], [place] [address]     Time coordinating discharge: 35 minutes  Signed: Aralynn Brake  [facility] 09/15/2022, 4:16 PM

## 2022-09-15 NOTE — Progress Notes (Signed)
Orthopaedic Trauma Service Progress Note  Patient ID: Teresa Daniels MRN: [id number] DOB/AGE: 1940/12/04 82 y.o.  Subjective:  Ortho issues stable  ROS As above  Objective:   VITALS:   Vitals:   09/14/22 1531 09/14/22 2053 09/15/22 0528 09/15/22 0741  BP: (!) 144/111 (!) 140/97 (!) 150/83 130/76  Pulse: 73 80 80 75  Resp: '18 18 18 19  '$ Temp: 98 F (36.7 C) 98.5 F (36.9 C) 98.3 F (36.8 C) 98.8 F (37.1 C)  TempSrc:  Oral Oral Oral  SpO2: 96% 96% 98% 100%  Weight:      Height:        Estimated body mass index is 26.41 kg/m as calculated from the following:   Height as of this encounter: '5\' 5"'$  (1.651 m).   Weight as of this encounter: 72 kg.   Intake/Output      03/07 0701 03/08 0700 03/08 0701 03/09 0700   P.O. 240    I.V. (mL/kg) 1329.3 (18.5)    Blood 315    IV Piggyback     Total Intake(mL/kg) 1884.3 (26.2)    Urine (mL/kg/hr) 900 (0.5)    Total Output 900    Net +984.3           LABS  Results for orders placed or performed during the hospital encounter of 09/07/22 (from the past 24 hour(s))  Glucose, capillary     Status: Abnormal   Collection Time: 09/14/22 11:52 AM  Result Value Ref Range   Glucose-Capillary 110 (H) 70 - 99 mg/dL  Glucose, capillary     Status: Abnormal   Collection Time: 09/14/22  3:30 PM  Result Value Ref Range   Glucose-Capillary 141 (H) 70 - 99 mg/dL  Glucose, capillary     Status: Abnormal   Collection Time: 09/14/22  8:46 PM  Result Value Ref Range   Glucose-Capillary 176 (H) 70 - 99 mg/dL  Basic metabolic panel     Status: Abnormal   Collection Time: 09/15/22  3:36 AM  Result Value Ref Range   Sodium 138 135 - 145 mmol/L   Potassium 4.0 3.5 - 5.1 mmol/L   Chloride 105 98 - 111 mmol/L   CO2 30 22 - 32 mmol/L   Glucose, Bld 144 (H) 70 - 99 mg/dL   BUN 20 8 - 23 mg/dL   Creatinine, Ser 0.98 0.44 - 1.00 mg/dL   Calcium 8.7 (L) 8.9 - 10.3  mg/dL   GFR, Estimated 58 (L) >60 mL/min   Anion gap 3 (L) 5 - 15  CBC with Differential/Platelet     Status: Abnormal   Collection Time: 09/15/22  3:36 AM  Result Value Ref Range   WBC 6.2 4.0 - 10.5 K/uL   RBC 2.78 (L) 3.87 - 5.11 MIL/uL   Hemoglobin 9.0 (L) 12.0 - 15.0 g/dL   HCT 26.6 (L) 36.0 - 46.0 %   MCV 95.7 80.0 - 100.0 fL   MCH 32.4 26.0 - 34.0 pg   MCHC 33.8 30.0 - 36.0 g/dL   RDW 15.6 (H) 11.5 - 15.5 %   Platelets 263 150 - 400 K/uL   nRBC 0.0 0.0 - 0.2 %   Neutrophils Relative % 60 %   Neutro Abs 3.8 1.7 - 7.7 K/uL   Lymphocytes Relative 23 %   Lymphs Abs  1.4 0.7 - 4.0 K/uL   Monocytes Relative 8 %   Monocytes Absolute 0.5 0.1 - 1.0 K/uL   Eosinophils Relative 7 %   Eosinophils Absolute 0.4 0.0 - 0.5 K/uL   Basophils Relative 1 %   Basophils Absolute 0.1 0.0 - 0.1 K/uL   Immature Granulocytes 1 %   Abs Immature Granulocytes 0.04 0.00 - 0.07 K/uL  Glucose, capillary     Status: Abnormal   Collection Time: 09/15/22  8:21 AM  Result Value Ref Range   Glucose-Capillary 147 (H) 70 - 99 mg/dL   Comment 1 Notify RN      PHYSICAL EXAM:   Gen: NAD Ext:       Left Lower Extremity              Short leg splint fitting             Ext warm              she is able to flex and ext her toes              Minimal swelling   Assessment/Plan: 3 Days Post-Op     Anti-infectives (From admission, onward)    Start     Dose/Rate Route Frequency Ordered Stop   09/12/22 2200  ceFAZolin (ANCEF) IVPB 2g/100 mL premix        2 g 200 mL/hr over 30 Minutes Intravenous Every 8 hours 09/12/22 1644 09/13/22 1359   09/12/22 1100  ceFAZolin (ANCEF) IVPB 2g/100 mL premix        2 g 200 mL/hr over 30 Minutes Intravenous  Once 09/11/22 1707 09/12/22 1049   09/08/22 1400  cephALEXin (KEFLEX) capsule 500 mg  Status:  Discontinued        500 mg Oral Every 8 hours 09/08/22 1152 09/12/22 1651   09/07/22 2230  cefTRIAXone (ROCEPHIN) 1 g in sodium chloride 0.9 % 100 mL IVPB  Status:   Discontinued        1 g 200 mL/hr over 30 Minutes Intravenous Every 24 hours 09/07/22 2132 09/08/22 1152     .  POD/HD#: 66  82 y/o female with complex medical history including chronic R leg weakness (presumably from Lumbar pathology which has been addressed surgically, Dr. Zada Finders), deconditioning, diabetes with peripheral neuropathy s/p fall with acute L bimalleolar ankle fracture dislocation    -L trimalleolar ankle fracture dislocation s/p ORIF plus repair of syndesmosis             NWB L leg x 6-8 weeks             Splint x 2 weeks then cam              PT/OT                         Really lift or slide tranfers due to profound weakness in R leg                          High fall risk              Ice and elevate                              - Pain management:             Multimodal    - Medical issues  Per primary    - DVT/PE prophylaxis:            resume plavix per medical service. Was on hold for ABL anemia    - Metabolic Bone Disease:             Dexa early 2023, scanned only left femoral neck which showed osteopenia              Given R ankle fracture about 1 1/2 years ago and L1 compression fracture [id number] she would certainly meet criteria to start treatment for osteoporosis.  They plan to discuss with PCP   - Dispo:             Continue with current care             ortho issues stable             Follow up with OTS in 2 weeks for suture removal, xrays and removal of splint     Jari Pigg, PA-C [phone] (C) 09/15/2022, 11:24 AM  [facility] [place]  [id number] [phone] Jenetta [phone] (F)    After 5pm and on the weekends please log on to Amion, go to orthopaedics and the look under the [facility] Call for the provider(s) on call. You can also call our office at [phone] and then follow the prompts to be connected to the call team.  Patient ID: Teresa Daniels, female   DOB:  [birth year], 82 y.o.   MRN: [id number]

## 2022-09-15 NOTE — Progress Notes (Signed)
Occupational Therapy Treatment Patient Details Name: Teresa Daniels MRN: [id number] DOB: 22-Aug-1940 Today's Date: 09/15/2022   History of present illness Pt is 82 yo female presenting to ED following a fall. PT hit her head but denies LOC. Deformity and pain in the L ankle. Now s/p left ORIF ankle fracture 3/5. Pt w/c bound at baseline due to recent lumbar surgery. Pt has 2 broken toes on her L foot. PMH includes recent lumbar surgery in January, asthma, CKD IV, depression, DM II, HTN, and stroke.   OT comments  Pt in bed upon therapy arrival and agreeable to participate in OT session focusing on BUE strengthening order to complete functional transfers with less difficulty. Pt provided with VC and visual demonstration of exercises for proper form and technique. Pt able to return demonstration. Rest breaks were taken at appropriate times/frequency. OT will continue to follow patient acutely.    Recommendations for follow up therapy are one component of a multi-disciplinary discharge planning process, led by the attending physician.  Recommendations may be updated based on patient status, additional functional criteria and insurance authorization.    Follow Up Recommendations  Skilled nursing-short term rehab (<3 hours/day)     Assistance Recommended at Discharge Intermittent Supervision/Assistance  Patient can return home with the following  A lot of help with walking and/or transfers;A lot of help with bathing/dressing/bathroom;Assistance with cooking/housework;Assist for transportation;Help with stairs or ramp for entrance   Equipment Recommendations  Other (comment) (defer to next venue of care)       Precautions / Restrictions Precautions Precautions: Fall Restrictions Weight Bearing Restrictions: Yes LLE Weight Bearing: Non weight bearing Other Position/Activity Restrictions: broken toes on the R foot WBAT              ADL either performed or assessed with clinical judgement       Cognition Arousal/Alertness: Awake/alert Behavior During Therapy: WFL for tasks assessed/performed Overall Cognitive Status: Impaired/Different from baseline Area of Impairment: Safety/judgement, Awareness, Problem solving       Following Commands: Follows one step commands consistently Safety/Judgement: Decreased awareness of safety, Decreased awareness of deficits   Problem Solving: Requires verbal cues, Requires tactile cues, Slow processing          Exercises General Exercises - Upper Extremity Shoulder Extension: Strengthening, Both, 10 reps, Theraband, Seated Theraband Level (Shoulder Extension): Level 2 (Red) Shoulder Horizontal ABduction: Strengthening, Both, 10 reps, Seated, Theraband Theraband Level (Shoulder Horizontal Abduction): Level 2 (Red) Shoulder Horizontal ADduction: Strengthening, Both, 10 reps, Seated, Theraband Theraband Level (Shoulder Horizontal Adduction): Level 2 (Red) Elbow Flexion: Strengthening, Both, 10 reps, Seated, Theraband Theraband Level (Elbow Flexion): Level 2 (Red) Elbow Extension: Strengthening, Both, 10 reps, Seated, Theraband Theraband Level (Elbow Extension): Level 2 (Red)            Pertinent Vitals/ Pain       Pain Assessment Pain Assessment: Faces Faces Pain Scale: Hurts little more Pain Location: Pt reports RLE from knee to toes is sore. Pain Descriptors / Indicators: Sore Pain Intervention(s): Monitored during session         Frequency  Min 2X/week        Progress Toward Goals  OT Goals(current goals can now be found in the care plan section)  Progress towards OT goals: Progressing toward goals     Plan Discharge plan remains appropriate;Frequency remains appropriate       AM-PAC OT "6 Clicks" Daily Activity     Outcome Measure   Help from another person eating meals?:  None Help from another person taking care of personal grooming?: None Help from another person toileting, which includes using  toliet, bedpan, or urinal?: Total Help from another person bathing (including washing, rinsing, drying)?: A Lot Help from another person to put on and taking off regular upper body clothing?: A Little Help from another person to put on and taking off regular lower body clothing?: A Lot 6 Click Score: 16    End of Session    OT Visit Diagnosis: Unsteadiness on feet (R26.81);Muscle weakness (generalized) (M62.81);Other abnormalities of gait and mobility (R26.89);Repeated falls (R29.6);History of falling (Z91.81);Pain Pain - Right/Left: Left Pain - part of body: Ankle and joints of foot   Activity Tolerance Patient tolerated treatment well   Patient Left in bed;with call bell/phone within reach;with bed alarm set           Time: 1530-1545 OT Time Calculation (min): 15 min  Charges: OT General Charges $OT Visit: 1 Visit OT Treatments $Therapeutic Exercise: 23-37 mins  Ailene Ravel, OTR/L,CBIS  Supplemental OT - [facility] and [facility] Secure Chat Preferred    Rhayne Chatwin, Clarene Duke 09/15/2022, 3:55 PM

## 2022-09-15 NOTE — Progress Notes (Signed)
Nutrition Follow-up  DOCUMENTATION CODES:   Not applicable  INTERVENTION:  Discontinue ensure max Ensure Enlive po BID, each supplement provides 350 kcal and 20 grams of protein. MVI with minerals daily Recommend bowel regimen  NUTRITION DIAGNOSIS:   Increased nutrient needs related to hip fracture as evidenced by estimated needs. - ongoing  GOAL:   Patient will meet greater than or equal to 90% of their needs - Goal unmet  MONITOR:   Supplement acceptance  REASON FOR ASSESSMENT:   Consult Assessment of nutrition requirement/status  ASSESSMENT:   82 y.o. female admits related to fall. PMH includes: CKD, DM, GERD, HTN, stroke. Pt is currently receiving medical management related to ankle fracture.  3/5 s/p ORIF of L ankle  Pt sitting in bed at time of visit. She endorses doing well. She reports ongoing limited appetite and recent taste changes. She has order for Ensure max however noted Ensure high protein on bedside table. She does not enjoy these supplements but can tolerate them and states "I know they are good for me." Pt states that she is only eating about 25% of her meals recently.   Meal completions: 3/1: 100% breakfast, 25% dinner 3/2: 20% breakfast 3/3: 100% breakfast 3/7: 75% breakfast  No updated weights on file to review.   Medications: SSI 0-5 units qhs, SSI 0-9 units TID, MVI, protonix  Labs reviewed  CBG's 110- 176 x24 hours   Diet Order:   Diet Order             Diet - low sodium heart healthy           Diet Carb Modified           Diet Carb Modified Fluid consistency: Thin; Room service appropriate? Yes  Diet effective now                   EDUCATION NEEDS:   Not appropriate for education at this time  Skin:  Skin Assessment: Reviewed RN Assessment (L ankle incision (closed))  Last BM:  3/5  Height:   Ht Readings from Last 1 Encounters:  09/07/22 '5\' 5"'$  (1.651 m)    Weight:   Wt Readings from Last 1 Encounters:   09/07/22 72 kg   BMI:  Body mass index is 26.41 kg/m.  Estimated Nutritional Needs:   Kcal:  1800-2000  Protein:  90-110 gm  Fluid:  >/= 1.8 L  Clayborne Dana, RDN, LDN Clinical Nutrition

## 2022-09-15 NOTE — TOC Transition Note (Signed)
Transition of Care [facility]) - CM/SW Discharge Note   Patient Details  Name: SUJEY SAXER MRN: [id number] Date of Birth: Apr 19, 1941  [facility] CM/SW Contact:  Coralee Pesa, [place] Phone Number: 09/15/2022, 4:20 PM   Clinical Narrative:    Pt to be transported to [place] via [facility] [facility]. Nurse to call report to [phone]   Final next level of care: Skilled Nursing Facility Barriers to Discharge: Barriers Resolved   Patient Goals and CMS Choice CMS Medicare.gov Compare Post Acute Care list provided to:: Patient Choice offered to / list presented to : Patient, Adult Children  Discharge Placement                Patient [facility] bed at: [facility] Patient to be transferred to facility by: [place] Name of family member notified: Everlene Farrier Patient and family notified of of transfer: 09/15/22  Discharge Plan and Services Additional resources added to the After Visit Summary for       Post Acute Care Choice: [place]                               Social Determinants of Health (SDOH) Interventions SDOH Screenings   Food Insecurity: No Food Insecurity (07/25/2022)  Housing: Low Risk  (07/25/2022)  Transportation Needs: No Transportation Needs (07/25/2022)  Utilities: Not At Risk (07/25/2022)  Tobacco Use: Medium Risk (09/13/2022)     Readmission Risk Interventions     No data to display

## 2022-09-15 NOTE — Plan of Care (Signed)
Patient ID: Teresa Daniels, female   DOB: Jul 22, 1940, 82 y.o.   MRN: [id number]  Problem: Education: Goal: Ability to describe self-care measures that may prevent or decrease complications (Diabetes Survival Skills Education) will improve Outcome: Adequate for Discharge Goal: Individualized Educational Video(s) Outcome: Adequate for Discharge   Problem: Coping: Goal: Ability to adjust to condition or change in health will improve Outcome: Adequate for Discharge   Problem: Fluid Volume: Goal: Ability to maintain a balanced intake and output will improve Outcome: Adequate for Discharge   Problem: Health Behavior/Discharge Planning: Goal: Ability to identify and utilize available resources and services will improve Outcome: Adequate for Discharge Goal: Ability to manage health-related needs will improve Outcome: Adequate for Discharge   Problem: Metabolic: Goal: Ability to maintain appropriate glucose levels will improve Outcome: Adequate for Discharge   Problem: Nutritional: Goal: Maintenance of adequate nutrition will improve Outcome: Adequate for Discharge Goal: Progress toward achieving an optimal weight will improve Outcome: Adequate for Discharge   Problem: Skin Integrity: Goal: Risk for impaired skin integrity will decrease Outcome: Adequate for Discharge   Problem: Tissue Perfusion: Goal: Adequacy of tissue perfusion will improve Outcome: Adequate for Discharge   Problem: Education: Goal: Knowledge of General Education information will improve Description: Including pain rating scale, medication(s)/side effects and non-pharmacologic comfort measures Outcome: Adequate for Discharge   Problem: Health Behavior/Discharge Planning: Goal: Ability to manage health-related needs will improve Outcome: Adequate for Discharge   Problem: Clinical Measurements: Goal: Ability to maintain clinical measurements within normal limits will improve Outcome: Adequate for  Discharge Goal: Will remain free from infection Outcome: Adequate for Discharge Goal: Diagnostic test results will improve Outcome: Adequate for Discharge Goal: Respiratory complications will improve Outcome: Adequate for Discharge Goal: Cardiovascular complication will be avoided Outcome: Adequate for Discharge   Problem: Activity: Goal: Risk for activity intolerance will decrease Outcome: Adequate for Discharge   Problem: Nutrition: Goal: Adequate nutrition will be maintained Outcome: Adequate for Discharge   Problem: Coping: Goal: Level of anxiety will decrease Outcome: Adequate for Discharge   Problem: Elimination: Goal: Will not experience complications related to bowel motility Outcome: Adequate for Discharge Goal: Will not experience complications related to urinary retention Outcome: Adequate for Discharge   Problem: Pain Managment: Goal: General experience of comfort will improve Outcome: Adequate for Discharge   Problem: Safety: Goal: Ability to remain free from injury will improve Outcome: Adequate for Discharge   Problem: Skin Integrity: Goal: Risk for impaired skin integrity will decrease Outcome: Adequate for Discharge   Problem: Acute Rehab PT Goals(only PT should resolve) Goal: Pt Will Go Supine/Side To Sit Outcome: Adequate for Discharge Goal: Pt Will Go Sit To Supine/Side Outcome: Adequate for Discharge Goal: Pt Will Transfer Bed To Chair/Chair To Bed Outcome: Adequate for Discharge   Problem: Increased Nutrient Needs (NI-5.1) Goal: Food and/or nutrient delivery Description: Individualized approach for food/nutrient provision. Outcome: Adequate for Discharge   Problem: Acute Rehab OT Goals (only OT should resolve) Goal: Pt. Will Perform Upper Body Dressing Outcome: Adequate for Discharge Goal: Pt. Will Perform Lower Body Dressing Outcome: Adequate for Discharge Goal: Pt. Will Transfer To Toilet Outcome: Adequate for Discharge Goal:  Pt/Caregiver Will Perform Home Exercise Program Outcome: Adequate for Discharge  Haydee Salter, RN

## 2022-09-15 NOTE — Progress Notes (Signed)
Patient ID: Teresa Daniels, female   DOB: 12-28-1940, 83 y.o.   MRN: [id number] Attempted to call report x 3 to [place] [address] hall. Left name number and message on voice mail. Awaiting return call.  Haydee Salter, RN

## 2022-10-22 NOTE — Op Note (Signed)
09/12/2022  11:54 PM  PATIENT:  Teresa Daniels  05/17/1941 female   MEDICAL RECORD NUMBER: [id number]  PRE-OPERATIVE DIAGNOSIS:   1. LEFT ANKLE TRIMALLEOLAR FRACTURE  POST-OPERATIVE DIAGNOSIS:  1. LEFT ANKLE TRIMALLEOLAR FRACTURE 2. LEFT ANKLE SYNDESMOSIS DISRUPTION  PROCEDURE:   OPEN REDUCTION INTERNAL FIXATION OF LEFT TRIMALLEOLAR ANKLE FRACTURE WITHOUT FIXATION OF THE POSTERIOR LIP OPEN REDUCTION INTERNAL FIXATION OF THE SYNDESMOSIS MANUAL APPLICATION OF STRESS ANKLE SYNDESMOSIS UNDER FLUOROSCOPY  SURGEON:  Doralee Albino. Carola Frost, M.D.  ASSISTANT:  Montez Morita, PA-C.  ANESTHESIA:  General.  COMPLICATIONS:  None.  TOURNIQUET: None.  ESTIMATED BLOOD LOSS:  50 mL.  DISPOSITION:  To PACU.  CONDITION:  Stable.  DELAY START OF DVT PROPHYLAXIS BECAUSE OF BLEEDING RISK: NO   BRIEF SUMMARY AND INDICATIONS FOR PROCEDURE:  The patient is a 82 y.o. who sustained a fracture dislocation of the ankle, treated with reduction at the time of presentation, followed by splint application. Patient now has sufficient swelling resolution to allow for surgical repair.  I discussed with the patient the risks and benefits of surgery including the possibility of infection, DVT, PE, nerve injury, vessel injury, loss of motion, arthritis, symptomatic hardware, heart attack, stroke and need for further surgery, among others. We specifically discussed syndesmotic repair and that some of the implants used for this would likely require subsequent removal. After acknowledging these risks, consent was provided to proceed.  SUMMARY OF PROCEDURE:  The patient was taken to the operating room after administration of a regional block and preoperative antibiotics.  The left lower extremity was prepped and draped in the usual sterile fashion.  A tourniquet was placed about the thigh but never inflated during the procedure.  A timeout was held, and then an incision was made directly over the lateral malleolus with careful  dissection to avoid injury to the superficial peroneal nerve.  The periosteum was left intact as we continued deep dissection.  The fracture site was identified and curettage and lavage used to remove hematoma.  With the assistance of distal manipulation and placement of tenaculums, we were able to obtain an anatomic reduction with  interdigitation of the primary fracture fragments.  Because of the angle and comminution of the fracture site, I was unable to place a lag screw but was able to hold this interdigitated during application of a lateral malleolus plate.  We used the [employer] system and placed standard fixation in the shaft and distal lateral malleolus, confirming plate position with x-ray and then continuing with a standard fixation as well as a locked fixation.  Final images showed appropriate reductional replacement, trajectory, and length.  Next, attention was turned to the medial side.  Here, a curvilinear incision was made to allow for distal placement of screws as well as direct access to the medial malleolus fracture site and joint.  There was a large medial malleolar fragment that did extend down to the anterior plafond.  This was debrided with curettage of the fracture edges as well as copious lavage of the joint.  I did inspect the posterior tibialis tendon, which was clearly visible and intact.  I did not identify any large fragments of articular cartilage loss off the dome of the talus.   I then placed a drill hole in the medial metaphysis of the tibia and used a pointed tenaculum to gain compression and reduction of the fracture site, which was also seen to interdigitate as a 15 blade was used to scratch back the periosteum just at the  edge for 2 mm or less.  This was followed by placement of K wires and then use of the cannulated drill placing 2 partially threaded screws. Compression was obtained.  The C-arm was then brought back in and AP, lateral and mortise views showed restoration of  ankle alignment reduction.    The posterior malleolar fracture fragment was then evaluated to gauge whether it should be fixed if it constituted a significant portion of the articular surface. Based on this factor it did not require fixation.   I then performed an external rotation stress view of the ankle under live fluoroscopy.  Instability was identified by lateral translation of the talus, widening of the syndesmotiic interval, and widening of the medial clear space. Consequently we proceeded with syndesmotic fixation. A small stab incision was made anteromedially and the pointed tenaculum used to apply pressure for reduction from head of most distal screw in the plate. Two tricortical screws were then placed from the fibula into the tibia with 15 degrees of anteversion.    PROGNOSIS: The patient will be nonweightbearing in the splint with ice and elevation over the next 3 to 5 days.  We will plan to see her back in the office in 10-14 days for removal of sutures and transition to a Cam boot with unrestricted range of motion of the  ankle at that time.  Weightbearing at 8 weeks.

## 2024-01-05 ENCOUNTER — Encounter (HOSPITAL_COMMUNITY): Payer: Self-pay | Admitting: Interventional Radiology

## 2024-04-09 ENCOUNTER — Telehealth (HOSPITAL_COMMUNITY): Payer: Self-pay

## 2024-04-09 NOTE — Telephone Encounter (Signed)
 Outside/paper referral received by Dr. Clotilda from [facility] for pulmonary rehab.  Spoke with patient's daughter (her caretaker), patient is going to receive rehab at her assisted living facility and will not be using [facility]. Informed her if she changes her mind to give us  a call back to reopen the referral.  Closing referral.
